# Patient Record
Sex: Male | Born: 1950 | Race: Black or African American | Hispanic: No | Marital: Married | Smoking: Current every day smoker
Health system: Southern US, Community
[De-identification: ages and names within clinical notes are randomized; demographics above are authoritative.]

## PROBLEM LIST (undated history)

## (undated) DIAGNOSIS — I219 Acute myocardial infarction, unspecified: Secondary | ICD-10-CM

## (undated) DIAGNOSIS — M1990 Unspecified osteoarthritis, unspecified site: Secondary | ICD-10-CM

## (undated) DIAGNOSIS — Z9981 Dependence on supplemental oxygen: Secondary | ICD-10-CM

## (undated) DIAGNOSIS — F32A Depression, unspecified: Secondary | ICD-10-CM

## (undated) DIAGNOSIS — F419 Anxiety disorder, unspecified: Secondary | ICD-10-CM

## (undated) DIAGNOSIS — F329 Major depressive disorder, single episode, unspecified: Secondary | ICD-10-CM

## (undated) DIAGNOSIS — K219 Gastro-esophageal reflux disease without esophagitis: Secondary | ICD-10-CM

## (undated) DIAGNOSIS — E119 Type 2 diabetes mellitus without complications: Secondary | ICD-10-CM

## (undated) DIAGNOSIS — I2510 Atherosclerotic heart disease of native coronary artery without angina pectoris: Secondary | ICD-10-CM

## (undated) DIAGNOSIS — R0602 Shortness of breath: Secondary | ICD-10-CM

## (undated) DIAGNOSIS — I10 Essential (primary) hypertension: Secondary | ICD-10-CM

## (undated) DIAGNOSIS — E785 Hyperlipidemia, unspecified: Secondary | ICD-10-CM

## (undated) DIAGNOSIS — J449 Chronic obstructive pulmonary disease, unspecified: Secondary | ICD-10-CM

## (undated) DIAGNOSIS — G4733 Obstructive sleep apnea (adult) (pediatric): Secondary | ICD-10-CM

## (undated) DIAGNOSIS — K2970 Gastritis, unspecified, without bleeding: Secondary | ICD-10-CM

## (undated) HISTORY — DX: Chronic obstructive pulmonary disease, unspecified: J44.9

## (undated) HISTORY — DX: Gastritis, unspecified, without bleeding: K29.70

## (undated) HISTORY — PX: CARDIAC CATHETERIZATION: SHX172

## (undated) HISTORY — PX: CORONARY ANGIOPLASTY: SHX604

## (undated) HISTORY — PX: DENTAL SURGERY: SHX609

## (undated) HISTORY — DX: Essential (primary) hypertension: I10

## (undated) HISTORY — DX: Obstructive sleep apnea (adult) (pediatric): G47.33

## (undated) HISTORY — DX: Atherosclerotic heart disease of native coronary artery without angina pectoris: I25.10

## (undated) SURGERY — ARTHROPLASTY, HIP, TOTAL,POSTERIOR APPROACH
Anesthesia: General | Laterality: Right

---

## 2003-08-19 ENCOUNTER — Ambulatory Visit (HOSPITAL_COMMUNITY): Admission: RE | Admit: 2003-08-19 | Discharge: 2003-08-19 | Payer: Self-pay | Admitting: Family Medicine

## 2003-11-02 ENCOUNTER — Emergency Department (HOSPITAL_COMMUNITY): Admission: EM | Admit: 2003-11-02 | Discharge: 2003-11-02 | Payer: Self-pay | Admitting: Emergency Medicine

## 2004-02-17 ENCOUNTER — Ambulatory Visit: Payer: Self-pay | Admitting: Family Medicine

## 2004-02-21 ENCOUNTER — Ambulatory Visit: Payer: Self-pay | Admitting: *Deleted

## 2004-02-21 ENCOUNTER — Ambulatory Visit: Payer: Self-pay | Admitting: Family Medicine

## 2004-07-06 ENCOUNTER — Emergency Department (HOSPITAL_COMMUNITY): Admission: EM | Admit: 2004-07-06 | Discharge: 2004-07-06 | Payer: Self-pay | Admitting: *Deleted

## 2004-07-06 ENCOUNTER — Ambulatory Visit (HOSPITAL_COMMUNITY): Admission: RE | Admit: 2004-07-06 | Discharge: 2004-07-06 | Payer: Self-pay | Admitting: Family Medicine

## 2004-07-16 ENCOUNTER — Ambulatory Visit: Payer: Self-pay | Admitting: Gastroenterology

## 2004-07-16 ENCOUNTER — Inpatient Hospital Stay (HOSPITAL_COMMUNITY): Admission: EM | Admit: 2004-07-16 | Discharge: 2004-07-19 | Payer: Self-pay | Admitting: Emergency Medicine

## 2004-09-09 ENCOUNTER — Ambulatory Visit (HOSPITAL_BASED_OUTPATIENT_CLINIC_OR_DEPARTMENT_OTHER): Admission: RE | Admit: 2004-09-09 | Discharge: 2004-09-09 | Payer: Self-pay | Admitting: Family Medicine

## 2004-09-13 ENCOUNTER — Ambulatory Visit: Payer: Self-pay | Admitting: Internal Medicine

## 2004-10-09 ENCOUNTER — Encounter (INDEPENDENT_AMBULATORY_CARE_PROVIDER_SITE_OTHER): Payer: Self-pay | Admitting: Specialist

## 2004-10-09 ENCOUNTER — Ambulatory Visit (HOSPITAL_COMMUNITY): Admission: RE | Admit: 2004-10-09 | Discharge: 2004-10-09 | Payer: Self-pay | Admitting: General Surgery

## 2006-02-22 ENCOUNTER — Emergency Department (HOSPITAL_COMMUNITY): Admission: EM | Admit: 2006-02-22 | Discharge: 2006-02-22 | Payer: Self-pay | Admitting: Emergency Medicine

## 2007-07-15 ENCOUNTER — Inpatient Hospital Stay (HOSPITAL_COMMUNITY): Admission: EM | Admit: 2007-07-15 | Discharge: 2007-07-20 | Payer: Self-pay | Admitting: Emergency Medicine

## 2007-07-16 ENCOUNTER — Encounter (INDEPENDENT_AMBULATORY_CARE_PROVIDER_SITE_OTHER): Payer: Self-pay | Admitting: *Deleted

## 2008-06-22 ENCOUNTER — Inpatient Hospital Stay (HOSPITAL_COMMUNITY): Admission: EM | Admit: 2008-06-22 | Discharge: 2008-06-29 | Payer: Self-pay | Admitting: Emergency Medicine

## 2008-07-18 ENCOUNTER — Encounter (HOSPITAL_COMMUNITY): Admission: RE | Admit: 2008-07-18 | Discharge: 2008-10-16 | Payer: Self-pay | Admitting: Cardiology

## 2008-08-14 ENCOUNTER — Ambulatory Visit (HOSPITAL_COMMUNITY): Payer: Self-pay | Admitting: Psychiatry

## 2008-08-28 ENCOUNTER — Ambulatory Visit (HOSPITAL_COMMUNITY): Payer: Self-pay | Admitting: Psychiatry

## 2008-10-03 ENCOUNTER — Ambulatory Visit (HOSPITAL_COMMUNITY): Payer: Self-pay | Admitting: Licensed Clinical Social Worker

## 2008-12-07 ENCOUNTER — Observation Stay (HOSPITAL_COMMUNITY): Admission: EM | Admit: 2008-12-07 | Discharge: 2008-12-10 | Payer: Self-pay | Admitting: Emergency Medicine

## 2008-12-09 ENCOUNTER — Encounter (INDEPENDENT_AMBULATORY_CARE_PROVIDER_SITE_OTHER): Payer: Self-pay | Admitting: Internal Medicine

## 2009-04-14 ENCOUNTER — Ambulatory Visit (HOSPITAL_COMMUNITY): Payer: Self-pay | Admitting: Psychiatry

## 2009-06-04 ENCOUNTER — Ambulatory Visit (HOSPITAL_COMMUNITY): Payer: Self-pay | Admitting: Psychiatry

## 2009-08-11 ENCOUNTER — Ambulatory Visit (HOSPITAL_COMMUNITY): Payer: Self-pay | Admitting: Psychiatry

## 2009-10-01 ENCOUNTER — Ambulatory Visit (HOSPITAL_COMMUNITY): Payer: Self-pay | Admitting: Psychiatry

## 2009-11-03 ENCOUNTER — Ambulatory Visit (HOSPITAL_COMMUNITY): Payer: Self-pay | Admitting: Psychiatry

## 2009-12-17 ENCOUNTER — Ambulatory Visit (HOSPITAL_COMMUNITY): Payer: Self-pay | Admitting: Psychiatry

## 2010-02-16 ENCOUNTER — Ambulatory Visit (HOSPITAL_COMMUNITY): Payer: Self-pay | Admitting: Psychiatry

## 2010-04-24 ENCOUNTER — Ambulatory Visit (HOSPITAL_COMMUNITY): Payer: Self-pay | Admitting: Psychiatry

## 2010-05-04 ENCOUNTER — Ambulatory Visit (HOSPITAL_COMMUNITY): Payer: Self-pay | Admitting: Psychiatry

## 2010-05-07 ENCOUNTER — Ambulatory Visit: Payer: Self-pay | Admitting: Sports Medicine

## 2010-06-29 ENCOUNTER — Encounter (HOSPITAL_COMMUNITY): Payer: MEDICARE | Admitting: Psychiatry

## 2010-06-29 DIAGNOSIS — F39 Unspecified mood [affective] disorder: Secondary | ICD-10-CM

## 2010-07-13 ENCOUNTER — Other Ambulatory Visit: Payer: Self-pay | Admitting: Orthopedic Surgery

## 2010-07-13 ENCOUNTER — Ambulatory Visit
Admission: RE | Admit: 2010-07-13 | Discharge: 2010-07-13 | Disposition: A | Discharge status: Home / Self Care | Payer: MEDICARE | Source: Ambulatory Visit | Attending: Orthopedic Surgery | Admitting: Orthopedic Surgery

## 2010-07-13 DIAGNOSIS — M25559 Pain in unspecified hip: Secondary | ICD-10-CM

## 2010-08-04 ENCOUNTER — Ambulatory Visit: Payer: MEDICARE | Admitting: Physical Therapy

## 2010-08-17 ENCOUNTER — Ambulatory Visit: Payer: MEDICARE | Admitting: Physical Therapy

## 2010-08-23 LAB — POCT I-STAT, CHEM 8
Calcium, Ion: 1.22 mmol/L (ref 1.12–1.32)
Chloride: 110 mEq/L (ref 96–112)
Glucose, Bld: 109 mg/dL — ABNORMAL HIGH (ref 70–99)
HCT: 51 % (ref 39.0–52.0)
Hemoglobin: 17.3 g/dL — ABNORMAL HIGH (ref 13.0–17.0)
Potassium: 3.6 mEq/L (ref 3.5–5.1)

## 2010-08-23 LAB — CK TOTAL AND CKMB (NOT AT ARMC): Relative Index: 2.1 (ref 0.0–2.5)

## 2010-08-23 LAB — POCT CARDIAC MARKERS: Troponin i, poc: <0.05 ng/mL (ref 0.00–0.09)

## 2010-08-23 LAB — RAPID URINE DRUG SCREEN, HOSP PERFORMED
Barbiturates: NOT DETECTED
Cocaine: NOT DETECTED
Opiates: POSITIVE — AB
Tetrahydrocannabinol: POSITIVE — AB

## 2010-08-23 LAB — CARDIAC PANEL(CRET KIN+CKTOT+MB+TROPI)
CK, MB: 1.8 ng/mL (ref 0.3–4.0)
CK, MB: 2.3 ng/mL (ref 0.3–4.0)
Relative Index: 1.5 (ref 0.0–2.5)
Relative Index: 1.9 (ref 0.0–2.5)
Total CK: 118 U/L (ref 7–232)
Troponin I: 0.01 ng/mL (ref 0.00–0.06)

## 2010-08-23 LAB — PROTIME-INR
INR: 1 (ref 0.00–1.49)
Prothrombin Time: 13.3 seconds (ref 11.6–15.2)

## 2010-08-23 LAB — APTT: aPTT: 30 seconds (ref 24–37)

## 2010-08-23 LAB — DIFFERENTIAL
Lymphs Abs: 2.4 10*3/uL (ref 0.7–4.0)
Monocytes Relative: 11 % (ref 3–12)
Neutro Abs: 4.9 10*3/uL (ref 1.7–7.7)
Neutrophils Relative %: 59 % (ref 43–77)

## 2010-08-23 LAB — TROPONIN I: Troponin I: 0.03 ng/mL (ref 0.00–0.06)

## 2010-08-23 LAB — CBC
Platelets: 251 10*3/uL (ref 150–400)
RBC: 5.46 MIL/uL (ref 4.22–5.81)
WBC: 8.3 10*3/uL (ref 4.0–10.5)

## 2010-08-24 ENCOUNTER — Encounter (HOSPITAL_COMMUNITY): Payer: MEDICARE | Admitting: Psychiatry

## 2010-08-26 ENCOUNTER — Encounter (HOSPITAL_COMMUNITY): Payer: MEDICARE | Admitting: Psychiatry

## 2010-08-26 DIAGNOSIS — F331 Major depressive disorder, recurrent, moderate: Secondary | ICD-10-CM

## 2010-08-31 ENCOUNTER — Ambulatory Visit: Payer: MEDICARE | Admitting: Physical Therapy

## 2010-09-01 LAB — BASIC METABOLIC PANEL
BUN: 13 mg/dL (ref 6–23)
BUN: 15 mg/dL (ref 6–23)
BUN: 17 mg/dL (ref 6–23)
CO2: 23 mEq/L (ref 19–32)
CO2: 25 mEq/L (ref 19–32)
CO2: 28 mEq/L (ref 19–32)
Calcium: 9.1 mg/dL (ref 8.4–10.5)
Chloride: 107 mEq/L (ref 96–112)
Creatinine, Ser: 1.18 mg/dL (ref 0.4–1.5)
GFR calc non Af Amer: >60 mL/min (ref >60)
GFR calc non Af Amer: >60 mL/min (ref >60)
GFR calc non Af Amer: >60 mL/min (ref >60)
Glucose, Bld: 105 mg/dL — ABNORMAL HIGH (ref 70–99)
Glucose, Bld: 126 mg/dL — ABNORMAL HIGH (ref 70–99)
Glucose, Bld: 153 mg/dL — ABNORMAL HIGH (ref 70–99)
Glucose, Bld: 96 mg/dL (ref 70–99)
Potassium: 4 mEq/L (ref 3.5–5.1)
Potassium: 4 mEq/L (ref 3.5–5.1)
Potassium: 4.1 mEq/L (ref 3.5–5.1)
Potassium: 4.2 mEq/L (ref 3.5–5.1)
Potassium: 4.3 mEq/L (ref 3.5–5.1)
Sodium: 136 mEq/L (ref 135–145)
Sodium: 138 mEq/L (ref 135–145)

## 2010-09-01 LAB — URINALYSIS, ROUTINE W REFLEX MICROSCOPIC
Bilirubin Urine: NEGATIVE
Glucose, UA: NEGATIVE mg/dL
Hgb urine dipstick: NEGATIVE
Ketones, ur: NEGATIVE mg/dL
Nitrite: NEGATIVE
Protein, ur: NEGATIVE mg/dL
Specific Gravity, Urine: 1.016 (ref 1.005–1.030)
Urobilinogen, UA: 0.2 mg/dL (ref 0.0–1.0)
pH: 5.5 (ref 5.0–8.0)

## 2010-09-01 LAB — LIPID PANEL
HDL: 46 mg/dL
Total CHOL/HDL Ratio: 3.8 ratio
Triglycerides: 41 mg/dL
VLDL: 8 mg/dL (ref 0–40)

## 2010-09-01 LAB — CBC
HCT: 42.4 % (ref 39.0–52.0)
HCT: 42.6 % (ref 39.0–52.0)
HCT: 43.9 % (ref 39.0–52.0)
HCT: 44.9 % (ref 39.0–52.0)
HCT: 48.8 % (ref 39.0–52.0)
Hemoglobin: 14.7 g/dL (ref 13.0–17.0)
Hemoglobin: 14.8 g/dL (ref 13.0–17.0)
Hemoglobin: 15.3 g/dL (ref 13.0–17.0)
Hemoglobin: 15.7 g/dL (ref 13.0–17.0)
Hemoglobin: 16.1 g/dL (ref 13.0–17.0)
MCHC: 34 g/dL (ref 30.0–36.0)
MCHC: 34.2 g/dL (ref 30.0–36.0)
MCHC: 34.5 g/dL (ref 30.0–36.0)
MCV: 90.1 fL (ref 78.0–100.0)
MCV: 92.1 fL (ref 78.0–100.0)
Platelets: 239 10*3/uL (ref 150–400)
Platelets: 275 10*3/uL (ref 150–400)
Platelets: 275 10*3/uL (ref 150–400)
RBC: 4.67 MIL/uL (ref 4.22–5.81)
RBC: 4.77 MIL/uL (ref 4.22–5.81)
RBC: 5.14 MIL/uL (ref 4.22–5.81)
RDW: 13.7 % (ref 11.5–15.5)
RDW: 13.8 % (ref 11.5–15.5)
RDW: 13.8 % (ref 11.5–15.5)
RDW: 13.8 % (ref 11.5–15.5)
RDW: 13.9 % (ref 11.5–15.5)
WBC: 14.5 10*3/uL — ABNORMAL HIGH (ref 4.0–10.5)
WBC: 8.6 10*3/uL (ref 4.0–10.5)

## 2010-09-01 LAB — CK TOTAL AND CKMB (NOT AT ARMC)
CK, MB: 27.4 ng/mL — ABNORMAL HIGH (ref 0.3–4.0)
Relative Index: 0.6 (ref 0.0–2.5)
Relative Index: 12.9 — ABNORMAL HIGH (ref 0.0–2.5)

## 2010-09-01 LAB — CARDIAC PANEL(CRET KIN+CKTOT+MB+TROPI)
CK, MB: 1.3 ng/mL (ref 0.3–4.0)
CK, MB: 1.4 ng/mL (ref 0.3–4.0)
CK, MB: 3.3 ng/mL (ref 0.3–4.0)
Relative Index: 0.8 (ref 0.0–2.5)
Relative Index: 1 (ref 0.0–2.5)
Total CK: 163 U/L (ref 7–232)
Total CK: 260 U/L — ABNORMAL HIGH (ref 7–232)
Troponin I: 0.01 ng/mL (ref 0.00–0.06)

## 2010-09-01 LAB — URINE MICROSCOPIC-ADD ON

## 2010-09-01 LAB — DRUGS OF ABUSE SCREEN W/O ALC, ROUTINE URINE
Creatinine,U: 71.9 mg/dL
Marijuana Metabolite: POSITIVE — AB
Methadone: NEGATIVE
Opiate Screen, Urine: NEGATIVE
Propoxyphene: NEGATIVE

## 2010-09-01 LAB — RAPID URINE DRUG SCREEN, HOSP PERFORMED
Amphetamines: NOT DETECTED
Barbiturates: NOT DETECTED
Benzodiazepines: NOT DETECTED
Cocaine: POSITIVE — AB
Opiates: POSITIVE — AB
Tetrahydrocannabinol: POSITIVE — AB

## 2010-09-01 LAB — POCT I-STAT 3, ART BLOOD GAS (G3+)
Acid-base deficit: 3 mmol/L — ABNORMAL HIGH (ref 0.0–2.0)
Bicarbonate: 21.4 meq/L (ref 20.0–24.0)
O2 Saturation: 93 %
TCO2: 22 mmol/L (ref 0–100)
pCO2 arterial: 34.7 mmHg — ABNORMAL LOW (ref 35.0–45.0)
pH, Arterial: 7.398 (ref 7.350–7.450)
pO2, Arterial: 68 mmHg — ABNORMAL LOW (ref 80.0–100.0)

## 2010-09-01 LAB — COMPREHENSIVE METABOLIC PANEL
ALT: 27 U/L (ref 0–53)
Calcium: 9.9 mg/dL (ref 8.4–10.5)
GFR calc Af Amer: >60 mL/min (ref >60)
Glucose, Bld: 94 mg/dL (ref 70–99)
Sodium: 141 mEq/L (ref 135–145)
Total Protein: 6.9 g/dL (ref 6.0–8.3)

## 2010-09-01 LAB — POCT CARDIAC MARKERS
CKMB, poc: <1 ng/mL — ABNORMAL LOW (ref 1.0–8.0)
CKMB, poc: <1 ng/mL — ABNORMAL LOW (ref 1.0–8.0)
Myoglobin, poc: 65.2 ng/mL (ref 12–200)
Myoglobin, poc: 69.1 ng/mL (ref 12–200)
Troponin i, poc: <0.05 ng/mL (ref 0.00–0.09)
Troponin i, poc: <0.05 ng/mL (ref 0.00–0.09)

## 2010-09-01 LAB — DIFFERENTIAL
Basophils Absolute: 0 K/uL (ref 0.0–0.1)
Basophils Relative: 1 % (ref 0–1)
Eosinophils Absolute: 0.2 K/uL (ref 0.0–0.7)
Eosinophils Relative: 2 % (ref 0–5)
Lymphocytes Relative: 29 % (ref 12–46)
Lymphs Abs: 2.2 K/uL (ref 0.7–4.0)
Monocytes Absolute: 0.7 K/uL (ref 0.1–1.0)
Monocytes Relative: 9 % (ref 3–12)
Neutro Abs: 4.4 K/uL (ref 1.7–7.7)
Neutrophils Relative %: 59 % (ref 43–77)

## 2010-09-01 LAB — APTT: aPTT: 32 seconds (ref 24–37)

## 2010-09-01 LAB — BRAIN NATRIURETIC PEPTIDE: Pro B Natriuretic peptide (BNP): <30 pg/mL (ref 0.0–100.0)

## 2010-09-01 LAB — HIV ANTIBODY (ROUTINE TESTING W REFLEX): HIV: NONREACTIVE

## 2010-09-01 LAB — HEPARIN ANTI-XA: Heparin LMW: 1.3 IU/mL

## 2010-09-01 LAB — PROTIME-INR: Prothrombin Time: 14.6 seconds (ref 11.6–15.2)

## 2010-09-01 LAB — TROPONIN I

## 2010-09-01 LAB — T4, FREE: Free T4: 0.85 ng/dL — ABNORMAL LOW (ref 0.89–1.80)

## 2010-09-01 LAB — HIV-1 RNA QUANT-NO REFLEX-BLD
HIV 1 RNA Quant: <48 {copies}/mL
HIV-1 RNA Quant, Log: <1.68 {Log}

## 2010-09-01 LAB — GLUCOSE, CAPILLARY: Glucose-Capillary: 231 mg/dL — ABNORMAL HIGH (ref 70–99)

## 2010-09-15 ENCOUNTER — Ambulatory Visit: Payer: MEDICARE | Admitting: Physical Therapy

## 2010-09-29 NOTE — H&P (Signed)
[name], [name] NO.:  [id number]   MEDICAL RECORD NO.:  [id number]          PATIENT TYPE:  INP   LOCATION:  3737                         FACILITY:  [facility]   PHYSICIAN:  Della Goo, M.D. DATE OF BIRTH:  05-14-1951   DATE OF ADMISSION:  12/07/2008  DATE OF DISCHARGE:                              HISTORY & PHYSICAL   ADDENDUM:   PAST SURGICAL HISTORY:  The patient has a history of the prior cardiac  catheterizations with PTCA and stenting of the LAD a total of five  times.  Initially x2, then a repeat x2 and then a repeat x1.   PAST MEDICAL HISTORY:  The patient also has hyperlipidemia and sciatica.   MEDICATIONS AT THIS TIME:  1. Plavix 75 mg 1 p.o. daily.  2. Amlodipine 5 mg 1 p.o. daily.  3. Metoprolol 25 mg 1 p.o. b.i.d.  4. Pravastatin 80 mg 1 p.o. daily.  5. Mirtazapine 15 mg p.o. nightly.   ALLERGIES:  NO KNOWN DRUG ALLERGIES.   SOCIAL HISTORY:  The patient reports smoking and smokes seven cigarettes  daily.  He previously had smoked more than this and has cut down.  He  reports rare alcohol usage.  He does have a past history of cocaine  abuse.   FAMILY HISTORY:  Positive for coronary artery disease in his father,  hypertension in both parents and diabetes in his mother.  No history of  cancer in his family that he knows of.   REVIEW OF SYSTEMS:  Pertinents are mentioned in the initial portion of  the H and P that included the HPI.   PHYSICAL EXAMINATION:  GENERAL:  This is a 60 year old well-nourished,  well-developed male in no visible discomfort or acute distress  currently.  VITAL SIGNS:  Temperature 97.7, blood pressure 123/81, heart rate 96,  respirations 17.  O2 saturations 100% on 2 liters nasal cannula oxygen.  HEENT:  Normocephalic, atraumatic.  Pupils equally round and reactive to  light.  Extraocular movements are intact.  There is no scleral icterus.  Funduscopic benign.  Nares are patent bilaterally.  Oropharynx is  clear.  NECK:  Supple.  Full range of motion.  No thyromegaly, adenopathy or  jugulovenous distention.  CARDIOVASCULAR:  Regular rate and rhythm.  No  murmurs, gallops or rubs.  No chest wall tenderness.  Lung fields reveal  diffuse rhonchorous breath sounds and no rales or wheezes.  ABDOMEN:  Positive bowel sounds.  Soft, nontender, nondistended.  EXTREMITIES:  Without cyanosis, clubbing or edema.  NEUROLOGIC:  The patient is alert and oriented x3 and there are no focal  deficits on examination.   LABORATORY STUDIES:  White blood cell count 8.3, hemoglobin 16.6,  hematocrit 49.7, platelets 251, neutrophils 59%, lymphocytes 29%, MCV  91.1, protime 13.3, INR 1.0, PTT 30.  Sodium 140, potassium 3.6,  chloride 110, CO2 of 21, BUN 20, creatinine 1.1 and glucose 109.  Chest  x-ray reveals no acute cardiopulmonary abnormalities, stable chronic  lung disease changes are seen.  Point of care cardiac markers with a  myoglobin of 83.1, CK-MB less than 1.0 and troponin  less than 0.05.  EKG  reveals a normal sinus rhythm.  There are no acute ST-segment changes  seen.   ASSESSMENT:  A 60 year old male being admitted with;  1. Chest pain.  2. Shortness of breath.  3. Chronic obstructive pulmonary disease exacerbation.  4. Upper respiratory infection/acute bronchitis.  5. Tobacco abuse.  6. Past history of polysubstance abuse.   PLAN:  The patient will be admitted to a telemetry area for cardiac  monitoring.  Cardiac enzymes will be performed.  The patient will be  placed on nebulizer treatments and supplemental oxygen as needed.  His  regular medications will be continued and the patient will also be  placed on nitropaste therapy topically and aspirin therapy.  DVT and GI  prophylaxis have also been ordered.  A urine drug screen has been  ordered as well.      Della Goo, M.D.  Electronically Signed     HJ/MEDQ  D:  12/07/2008  T:  12/07/2008  Job:  [id number]   cc:   Renaye Rakers,  M.D.

## 2010-09-29 NOTE — Discharge Summary (Signed)
NAMEKEYION, KNACK NO.:  [id number]   MEDICAL RECORD NO.:  [id number]          PATIENT TYPE:  INP   LOCATION:  2029                         FACILITY:  [facility]   PHYSICIAN:  Marcellus Scott, MD     DATE OF BIRTH:  07-Dec-1950   DATE OF ADMISSION:  06/22/2008  DATE OF DISCHARGE:  06/29/2008                               DISCHARGE SUMMARY   PRIMARY MEDICAL DOCTOR:  Dr. Renaye Rakers of the [facility].   PRIMARY CARDIOLOGIST:  Dr. Donnie Aho.   DISCHARGE DIAGNOSES:  1. Chest pain - resolved.  Status post restenting of left anterior      descending artery in patient with prior history of coronary artery      disease and left anterior descending stent.  2. Hypertension.  3. Dyslipidemia.  4. Tobacco abuse.  5. Substance abuse - cocaine and tetrahydrocannabinol.  6. Chronic obstructive pulmonary disease exacerbation.  Exacerbation      resolved.  On home oxygen.  7. Leukocytosis secondary to steroids.  8. Obstructive sleep apnea on nightly continuous positive airway      pressure.  9. Hyperglycemia, ? type 2 diabetes.  10.Psychiatry - anxiety disorder not otherwise specified, rule out      generalized anxiety disorder.   DISCHARGE MEDICATIONS:  1. Combivent 2 puffs inhaled q.i.d. p.r.n.  2. Enteric-coated aspirin 325 mg p.o. daily.  3. Plavix 75 mg p.o. daily.  4. Metoprolol 25 mg p.o. b.i.d.  5. Prednisone taper as per directions.  6. Pravastatin 40 mg tablet, 2 tablets p.o. q.h.s.  7. Home oxygen at 2 L/min.  8. Nightly CPAP.  9. Nicotine patch 14 mg per 24 hours, 1 daily for 1 week, then taper      to 7 mg per 24 hours daily for 2 weeks.  10.Norvasc 5 mg p.o. daily.   PROCEDURES:  1. Cardiac catheterization by Dr. Verdis Prime on February 11.  Please      refer to his procedure note for details.  Conclusion:  Successful      drug-eluting stent implanted to a region of in-stent restenosis      from a previously-placed left anterior descending bare metal  stent      with reduction in stenosis from 80 to less than 10% with TIMI grade      3 flow.  Complicated procedure due to precipitous and unexplainable      drop in blood pressure related to intracoronary nitroglycerin.  The      patient did admit to using heavy doses of Viagra prior to admission      to the hospital earlier the week before.  2. Chest x-ray on February 6:  No acute interval change.  No acute      cardiopulmonary process.   PERTINENT LABORATORY DATA:  CBC today with hemoglobin 14.8, hematocrit  42.4, white blood cell 14.4, platelets 251.  Basic metabolic panel  yesterday with BUN 17, creatinine 1.19.  HIV RNA quantitative less than  48 and quantitative log less than 1.68.  Urine drug screen positive for  marijuana.  Free T4 0.85, TSH 1.137.  Hepatic panel unremarkable.  Lipid  panel with LDL 120, HDL 46, VLDL 8, triglycerides 41 and cholesterol  174.  HIV antibody nonreactive.  Hemoglobin A1c 5.7.  Urine drug screen  on February 6 with positive cocaine, opiates and tetrahydrocannabinol.  D-dimer negative at 0.22.  BNP less than 30.   CONSULTATIONS:  1. Cardiology, Dr. Donnie Aho and Dr. Verdis Prime.  2..  Psychiatry, Dr. Jeanie Sewer.   HOSPITAL COURSE AND PATIENT DISPOSITION:  1. Mr. Lunn is a 60 year old African American male patient with      history of coronary artery disease status post 2 stents in [place] and 2 at [facility], hypertension,      hyperlipidemia, COPD on home O2, sleep apnea on CPAP nightly      through [employer], who presented with history of atypical      intermittent chest pain ongoing for 2 months prior to admission.      He he had been noncompliant to medications secondary to financial      problems.  He was then admitted to the hospital for further      evaluation and management.  He was admitted to telemetry.      Cardiology was consulted.  Dr. Donnie Aho did see him.  Initially he      planned for a stress test but  subsequently the patient underwent a      cardiac catheterization which basically showed in-stent restenosis      of the LAD stent.  Interventional cardiologist Dr. Katrinka Blazing was called      in consult and he was able to place a drug-eluting stent.      Periprocedure the patient dropped his blood pressure, following      which he had to have pressors, that is, dopamine briefly.  The      patient was transferred to step-down unit for a day.  The patient      since then has done quite well with no further chest pains.  He has      also been seen in cardiac rehab.  The patient, of note, in the      hospital has been on aspirin, Plavix, metoprolol.  Post restenting      the patient had a bump in troponin which may be secondary to the      intervention itself and secondary to the transient drop in blood      pressure.  Cardiology has seen him and cleared him for discharge      today.  The patient has been counseled regarding abstinence from      all the substance abuse including tobacco, cocaine and marijuana.      He repeatedly has expressed that he will not go back to these      substances because he is aware that these are going to be      detrimental to his health and even could kill him.  He has also      been made aware of the dangerous side effects of using cocaine      while on metoprolol.  He verbalizes understanding.  The patient we      will need to follow up with Dr. Donnie Aho in a week from discharge.      The patient indicates that he gets his disability payment on the      3rd of every month.  Thereby, assistance is made in terms of  providing him with a Plavix card which will provide him with 2      weeks' supply of Plavix.  He indicates that he will be able to buy      the remaining duration of the Plavix until he gets his disability.      We will also ask social worker to see if they can assist in any      way.  Also, significant other medications such as metoprolol and       pravastatin are being made available so that he can purchase them      at the $4 prescriptions.  2. Hypertension.  Continue with metoprolol and amlodipine.  3. Dyslipidemia.  Simvastatin has been switched to Pravachol so as to      be able to afford and also with lesser drug interactions.  4. Tobacco abuse.  Cessation counseling done.  The patient requests a      nicotine patch, which will be provided.  5. Substance abuse - Cocaine and tetrahydrocannabinol.  Again,      cessation counseling done repeatedly.  6. Chronic obstructive pulmonary disease exacerbation.  Exacerbation      has resolved.  The patient will be tapered off his steroids, which      is the cause of his leukocytosis.  He will also continue his home      O2.  7. Obstructive sleep apnea.  Continue his nightly CPAP per the      previous home regimen.  8. Hyperglycemia.  Rule out type 2 diabetes mellitus as an outpatient.      However, it does not seem to be the case.  This is probably steroid-      related given his A1c less than 6.  9. Psychiatry.  Dr. Jeanie Sewer did see Mr. Juanetta Gosling on February 10.      Please refer to his detailed note.  He indicated that he was not at      risk to harm himself or others.  He recommended avoiding      psychotropic medication unless he did not respond to psychotherapy      or develops severe anxiety symptoms such as panic attacks.  He has      an appointment to follow up with Dr. Lolly Mustache at the [facility]      outpatient behavioral health center on August 14, 2008, at 9:30 a.m.   The patient has some abnormality of thyroid function tests with normal  TSH and low free T4.  Consider repeating these thyroid function tests in  4 weeks.  He is clinically euthyroid.   Time spent in coordinating this discharge is 45 minutes.      Marcellus Scott, MD  Electronically Signed     AH/MEDQ  D:  06/29/2008  T:  06/29/2008  Job:  [id number]   cc:   Renaye Rakers, M.D.  Georga Hacking, M.D.   Lyn Records, M.D.  Antonietta Breach, M.D.  Syed T. Lolly Mustache, M.D.

## 2010-09-29 NOTE — Cardiovascular Report (Signed)
[name], [name] NO.:  [id number]   MEDICAL RECORD NO.:  [id number]          PATIENT TYPE:  INP   LOCATION:  3739                         FACILITY:  [facility]   PHYSICIAN:  Georga Hacking, M.D.DATE OF BIRTH:  [birth year]   DATE OF PROCEDURE:  06/22/2008  DATE OF DISCHARGE:                            CARDIAC CATHETERIZATION   HISTORY:  A 60 year old black male with a previous history of a  myocardial infarction in 2000 treated with T and K.  He later had  catheterization showing an occluded marginal branch and had a bare-metal  stent in the first marginal branch as well as in the mid LAD.  In 2003  in [place], [place], he had recurrent chest pain and was found  to have in-stent restenosis of the first marginal branch and a new  stenosis in the mid circumflex.  He had a drug-eluting stent placed  across the area of in-stent restenosis and a bare-metal stent in the  circumflex.  Intravascular ultrasound of the LAD at that time showed  luminal diameter of 4 sq mm.  This artery was left alone.  He presented  recently to the emergency room with cocaine use and chest pain, but no  EKG changes or enzymes and has continued to have chest pain.   PROCEDURE:  Left heart catheterization with coronary angiograms and left  ventriculogram.   COMMENTS ABOUT PROCEDURE:  The procedure was done in the cardiovascular  lab.  He was prepped and draped in the usual manner.  After Xylocaine  anesthesia, a 6-French sheath was placed in the right femoral  percutaneously.  Angiograms were made using 6-French catheters and a 30  mL ventriculogram was performed and tolerated the procedure well.   HEMODYNAMIC DATA:  Aorta postcontrast 112/5-11, LV postcontrast 112/79.   ANGIOGRAPHIC DATA:  Left ventriculogram:  Performed in the 30-degree RAO  projection.  The aortic valve is normal.  The mitral valve is normal.  The left ventricle is normal in size.  There is akinesis of the  distal  inferior and apical segments.  Estimated ejection fraction is 50%.  Coronary arteries:  Arise and distribute normally.  Minimal coronary  calcification was noted.  Left main coronary artery is normal.  Left  anterior descending has a small-to-moderate size first diagonal branch  with a severe 95% ostial stenosis present.  The left anterior descending  has a very radiopaque stent in the vessel just after the first septal  perforator with an area of diffuse 60-70% in-stent restenosis  angiographically.  Circumflex coronary artery has a first marginal  branch that has a very radiopaque stent with the appearance of possibly  to stents in the proximal portion.  This appears widely patent with very  minimal in-stent restenosis of the very ostium of the stent.  A second  stent and radiopaque is also seen in the circumflex continuation branch  and is widely patent with no significant re-narrowing.  The right  coronary artery is a codominant vessel and has a diffuse 50% proximal  stenosis noted, has a small posterior descending artery.   IMPRESSION:  1.  Significant coronary artery disease with a severe ostial stenosis      involving the diagonal branch, diffuse in-stent restenosis to left      anterior descending coronary artery, uncertain of significance,      widely patent stents in the first marginal and continuation branch      and moderate stenosis involving the right coronary artery.  2. Abnormal left ventricular function with apical hypokinesis and      distal inferior hypokinesis.   RECOMMENDATIONS:  I am not sure the LAD stent is that much different,  but we will consult Interventional Cardiology for opinion about whether  to continue medical treatment or consider intravascular ultrasound or  other percutaneous therapy.      Georga Hacking, M.D.  Electronically Signed     WST/MEDQ  D:  06/26/2008  T:  06/27/2008  Job:  [id number]   cc:   Renaye Rakers, M.D.

## 2010-09-29 NOTE — Discharge Summary (Signed)
[name], [name] NO.:  [id number]   MEDICAL RECORD NO.:  [id number]          PATIENT TYPE:  OBV   LOCATION:  3737                         FACILITY:  [facility]   PHYSICIAN:  Michelene Gardener, MD    DATE OF BIRTH:  1951/04/19   DATE OF ADMISSION:  12/07/2008  DATE OF DISCHARGE:  12/10/2008                               DISCHARGE SUMMARY   DISCHARGE DIAGNOSES:  1. Pleuritic chest pain, most likely secondary to acute bronchitis.  2. Acute bronchitis.  3. Hypoxia that resolved.  4. History of chronic obstructive pulmonary disease that has been      stable during this hospitalization.  5. Tobacco abuse.  6. Obstructive sleep apnea, and the patient is noncompliant with his      CPAP.  7. Hypertension.  8. Hyperlipidemia.  9. Benign prostatic hypertrophy.  10.Remote history of cocaine abuse.  11.Active marijuana use.   DISCHARGE MEDICATIONS:  1. Plavix 75 mg once a day.  2. Norvasc 5 mg once a day.  3. Metoprolol 25 mg twice daily.  4. Pravastatin 80 mg once a day.  5. Mirtazapine 15 mg once a day.   CONSULTATIONS:  Cardiology consult, initially done by Dr. Amil Amen, and  the patient then followed by his cardiologist, Dr. Donnie Aho.   PROCEDURES:  None.   DIAGNOSTIC STUDIES:  1. Chest x-ray on July 24 showed no acute cardiopulmonary      abnormalities.  2. CT angiography of the chest with contrast on July 26 showed no      evidence of PE and no acute abnormalities.  Echocardiogram on July      26 showed ejection fraction in the range of 40-45% with severe      hypokinesis of the inferolateral myocardium and grade 1 diastolic      dysfunction.   FOLLOWUP:  1. Renaye Rakers, MD, in 1-2 weeks.  2. W. Viann Fish, MD, as needed.   COURSE OF HOSPITALIZATION:  This is a 60 year old African American male  with known coronary artery disease with recent stent, who presented to  the hospital with atypical chest pain.  The patient was admitted to the  hospital.  He  was monitored on telemetry and his tele monitor did not  show any evidence of arrhythmia.  Three sets of troponin and cardiac  enzymes were done and they came to be normal.  The patient was evaluated  by Cardiology, who recommended an echocardiogram.  Echocardiogram was  done and results were mentioned above.  Case was discussed with Dr.  Donnie Aho, who think this is his baseline, and his symptoms are only  related to possibly bronchitis and noncardiac.  The patient will be  continued on the same medications.  He will be followed by Dr. Donnie Aho as  an outpatient.  Plan is to consider cardiac catheterization if the pain  persisted.  The patient was treated for acute bronchitis with IV  antibiotics, oxygen, and nebulizer treatment.  At the time of discharge,  he is feeling much better.  I gave him Levaquin for more 5 days.  He  will be discharged  in stable condition.  He will follow with his primary  doctor.  No change in his preadmission medications done.   TOTAL ASSESSMENT TIME:  40 minutes.      Michelene Gardener, MD  Electronically Signed     NAE/MEDQ  D:  12/10/2008  T:  12/10/2008  Job:  [id number]

## 2010-09-29 NOTE — H&P (Signed)
NAMEBLAYN, [name] NO.:  [id number]   MEDICAL RECORD NO.:  [id number]          PATIENT TYPE:  INP   LOCATION:  3737                         FACILITY:  [facility]   PHYSICIAN:  Della Goo, M.D. DATE OF BIRTH:  1951/01/25   DATE OF ADMISSION:  12/07/2008  DATE OF DISCHARGE:                              HISTORY & PHYSICAL   DATE OF ADMISSION:  December 07, 2008.   PRIMARY CARE PHYSICIAN:  Renaye Rakers, M.D.   CHIEF COMPLAINT:  Chest pain and shortness of breath   HISTORY OF PRESENT ILLNESS:  This is a 60 year old male with multiple  medical problems including coronary artery disease status post PTCA with  stent placement, who presents to the emergency department with  complaints of chest pain which he reports having off and on for 10 days  associated with shortness of breath, tightness and coughing.  He denies  having any fevers or chills, but he does report coughing up a yellowish-  type of sputum.  The patient reports having a similar feeling when he  had his previous MIs.  He denies having any nausea, vomiting or  diaphoresis.  He denies having any syncope or lightheadedness.   PAST MEDICAL HISTORY:  1. Significant for coronary artery disease status post initial PTCA      with stent placement in [place], than a repeat stent      placement secondary to restenosis x2 performed at [facility] and then restenting of one vessel by      Dr. Verdis Prime, Cardiologist in February 2010.  2. He also has a history of COPD.  3. Obstructive sleep apnea.  4. Hypertension.  5. Hyperlipidemia.  6. Hemorrhoids.  7. Benign prostatic hypertrophy.  8. Past history of cocaine abuse.  9. 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   DICTATION ENDS HERE      Della Goo, M.D.  Electronically Signed     HJ/MEDQ  D:  12/07/2008  T:  12/07/2008  Job:  [id number]   cc:   Renaye Rakers, M.D.

## 2010-09-29 NOTE — Consult Note (Signed)
NAMEQUANG, [name] NO.:  [id number]   MEDICAL RECORD NO.:  [id number]          PATIENT TYPE:  INP   LOCATION:  3739                         FACILITY:  [facility]   PHYSICIAN:  Georga Hacking, M.D.DATE OF BIRTH:  08-26-1950   DATE OF CONSULTATION:  06/23/2008  DATE OF DISCHARGE:                                 CONSULTATION   I was asked to see this 60 year old African American male for evaluation  of chest pain in the setting of coronary disease.  The patient is 60  years old and has previously been a difficult historian.  He reports  when he was in [place], [place] that he had a myocardial  infarction and 2 stents placed, but he could not remember further  details beyond that or were the stents were placed.  There are records  in the chart indicating he may have had stents placed at [facility], but again there is no further documentation of this.  He has a  history of hyperlipidemia, history of obstructive sleep apnea and COPD,  and hypertension.  He presented to the emergency room yesterday with  fleeting atypical chest pain, also a squeezing sensation of chest pain.  He says that he sees Dr. Parke Simmers and last saw her this summer.  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  He evidently states  in the chart also that he missed an appointment with [facility] and  has been out of his medicine and cannot afford it, although he told me  that he took Norvasc and aspirin up until recently.  When he presented  to the emergency room, he would have a cramping sensation in his chest  that would go to his back and also into his neck and it is accompanied  by nausea.  He states that he has been able to exercise without chest  pain on his treadmill at home.  He had a stressful event happen to him  last week, but he was unwilling to go into, and in addition reported  using cocaine last Wednesday.  States this is the first time in a long  time.  He came to the emergency room with shortness of breath and was  tested positive for cocaine and marijuana.  He has also used cigarettes  and alcohol recently.   PAST MEDICAL HISTORY:  Remarkable for coronary artery disease.  There is  a stent present on his chest x-ray.  Hypertension, hyperlipidemia, COPD,  anxiety, history of hemorrhoids, history of condyloma acuminata with a  previous history of obstructive sleep apnea.  He has a previous history  of BPH and significant hemorrhagic cystitis.   PAST SURGICAL HISTORY:  He has had surgery on his hands done before.  He  has had surgery for condyloma acuminata.   MEDICATIONS PRIOR TO DISCHARGE:  According to him included aspirin and  Norvasc, although this is different from records in the chart.   SOCIAL HISTORY:  He has a live-in girlfriend at  his home.  He states he  is on disability for mental health reasons.  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  He does have ongoing cigarette  abuse, marijuana use, cocaine use, and alcohol use.   FAMILY HISTORY:  He has a sister who has coronary artery disease.   ALLERGIES:  None.   REVIEW OF SYSTEMS:  He thinks he might be coming down with an ulcer.  He  says that his legs are weak.  He has difficulty walking.  He has  symptoms of benign prostatic hypertrophy.  He also has erectile  dysfunction and says he used Viagra recently.  He admits to significant  anxiety.   EXAMINATION:  CONSTITUTIONAL:  He is a middle-aged black male with  somewhat pressured speech.  He is vague to answer questions and often  times will not give direct answers to questions.  VITAL SIGNS:  Blood pressure is currently 129/87, pulse currently 98.  SKIN:  Warm and dry.  ENT:  EOMI.  PERRLA.  CNS clear.  Fundi unremarkable.  Pharynx negative.  NECK:  Supple without masses, JVD, thyromegaly or bruits.  LUNGS:   Clear to A and P.  No wheezing.  CARDIAC:  Normal S1, S2.  No S3.  ABDOMEN:  Soft and nontender.  Pulses are present with 2+.   DIAGNOSTIC STUDIES:  EKG shows some lateral Q-waves consistent with a  previous lateral wall infarction.  Laboratory shows elevated glucoses.  BUN and creatinine were normal.  CBC showed hematocrit of 48.8,  hemoglobin A1c is 5.7.  Cholesterol was 174, LDL is 120, HDL is 46.  Drug screen was positive for cocaine, opiates, and THC.   IMPRESSION:  1. Chest pain with atypical features.  2. Coronary artery disease with a previous lateral wall infarction on      EKG but reported negative echo in March 2009.  3. Recent substance abuse with cocaine, marijuana and opiates.  4. Cigarette abuse.  5. Hypertension.  6. Medical noncompliance.  7. Mild hyperlipidemia.  8. History of suicide attempt in 1993, questionable underlying mental      disorder.   RECOMMENDATIONS:  I find a history to be quite difficult at this time.  He has evidence of recent cocaine usage.  My recommendations would be to  try to obtain records of where he has had his previous stents placed in  the percutaneous records.  I would suggest trying to obtain them from  [facility] and [facility] as well as  microfilm of the [facility] records.  I would continue amlodipine and  aspirin at the present time.  Once the cocaine is washed out of his  system and he has settled down, we might consider outpatient stress  testing.  I would observe him for a couple more days in the hospital, be  sure he does not have recurrent pain or unstable angina since his  enzymes are negative.  I would strongly consider a psychiatric  consultation.      Georga Hacking, M.D.  Electronically Signed     WST/MEDQ  D:  06/23/2008  T:  06/23/2008  Job:  [id number]   cc:   Renee Ramus, MD  Renaye Rakers, M.D.

## 2010-09-29 NOTE — Cardiovascular Report (Signed)
NAMEELROY, SCHEMBRI NO.:  [id number]   MEDICAL RECORD NO.:  [id number]          PATIENT TYPE:  INP   LOCATION:  2807                         FACILITY:  [facility]   PHYSICIAN:  Lyn Records, M.D.   DATE OF BIRTH:  May 16, 1951   DATE OF PROCEDURE:  06/27/2008  DATE OF DISCHARGE:                            CARDIAC CATHETERIZATION   REASON FOR PROCEDURE:  Unstable angina.   PROCEDURES PERFORMED:  1. Drug-eluting stent implantation for in-stent restenosis in the left      anterior descending.  2. Intravascular ultrasound.   INDICATION:  The patient has had somewhat atypical but recurring chest  pressure, usually at rest.  He has a history of cocaine use.  He has had  multiple prior stents placed and has a history of restenosis.  This  study is being performed to treat significant LAD ISR that is felt to be  the patient's culprit lesion.  There is also a possibility that a large  first diagonal will be treated today as well.   DESCRIPTION:  The patient was brought to the cath lab in the post  absorptive state.  We discussed the patient's procedure the night prior  and on the day of the test.  He understands the procedure, and the  possibility of complications including emergency coronary bypass surgery  and myocardial infarction.   The patient had been premedicated with Plavix on the evening prior and  the morning of the procedure, a total over the last 12 hours of 600 mg.  He was also given 1 mg of Versed and 25 mcg of fentanyl as a conscious  sedation.  A 6-French sheath was started in the right femoral artery  without complications.   A 6-French XB 3.5 guide catheter was then used to obtain guiding shots.  The patient was given a bolus followed by an infusion of Angiomax (0.75  mg/kg bolus over 2 minutes followed by 1.75 mg/kg/hour infusion).  ACT  was documented to be greater than 300.  An Office manager was used  across the stenosis in the LAD.   Intravascular ultrasound was performed  and documented a cross-sectional area of 2.91 mm square in the proximal  region of in-stent restenosis and 3.8 mm square at the distal stent  margin.  The reference vessel diameter beyond the distal vessel was 3.9  mm in diameter.   An angioscope 3.0 mm, 10-mm long balloon was used for predilatation.  Four inflations were performed up to 14 atmospheres.  We then deployed a  23 x 3.5 mm diameter XIENCE V stent to 13 atmospheres.  This stent  overlapped the previously placed stent, both proximally and distally.  After deployment of the XIENCE V, intracoronary nitroglycerin, 100 mcg,  was administered.  Within the next 30 minutes, there was severe  reduction in blood pressure into the 50 range.  This did not respond  initially to fluid bolus.  We subsequently had to use vasoactive  medication therapy including 1 ampule of intravenous atropine, a 0.2 mL  bolus of epinephrine, and a dopamine drip was started.  [name]  blood pressure gradually started to improve over the next 3-7 minutes.  The dopamine drip was weaned.  The patient experienced chest discomfort  after the stent was deployed.  It was felt that this was related to  septal perforator spasm.  Because of the spasm, intracoronary  nitroglycerin was felt to be necessary.   After recovery of the blood pressure, intracoronary verapamil was given,  100 mcg.  Flow in the septal perforators improved.  Chest discomfort  continued moderately.  Post dilatation with a 4.0 x 20 mm long Voyager  Knierim balloon was performed distally and proximally.  Each inflation was  performed to 14 atmospheres.  The patient did note during the procedure  that each balloon occlusion reproduced chest discomfort similar to those  episodes that he has been having at home.   With dopamine and IV fluid being administered, an additional 100 mcg of  intracoronary nitroglycerin was administered because of what was felt to   be spasm in this vessel, and again, a mild to moderate drop in systolic  blood pressure to 90 occurred.  This was 20 minutes after multiple fluid  boluses and dopamine administration was continuing.   Angiography was then performed demonstrating a more than adequate  angiographic result with less than 10% stenosis throughout the entire  stented region. No contrast hang up was noted.  Flow into 2 septal  perforators was decreased but improved.  The patient left the cath lab  having mild low-grade chest discomfort.  No ST-segment elevation was  noted.   Angio-Seal was used successfully to achieve hemostasis.   Angiomax will be continued at a lower infusion rate for an additional  hour or so, in case any of the discomfort in his chest is related to  thrombus.   CONCLUSIONS:  1. Successful drug-eluting stent implantation to a region of in-stent      restenosis from a previously placed left anterior descending bare-      metal stent with reduction in stenosis from 80 to less than 10%      with TIMI grade 3 flow.  2. Complicated procedure due to precipitous and unexplainable drop in      blood pressure related to intracoronary nitroglycerin.  The patient      did admit to using heavy doses of Viagra (PDE5 inhibitor) prior to      admission to the hospital earlier this week.   PLAN:  1. Serial cardiac markers.  2. EKG will be obtained in the holding area.  3. Wean and discontinue dopamine.  4. IV fluid.  5. Aspirin and Plavix for at least a year.      Lyn Records, M.D.  Electronically Signed     HWS/MEDQ  D:  06/27/2008  T:  06/28/2008  Job:  [id number]   cc:   Georga Hacking, M.D.

## 2010-09-29 NOTE — Consult Note (Signed)
[name], [name] NO.:  [id number]   MEDICAL RECORD NO.:  [id number]          PATIENT TYPE:  INP   LOCATION:  3737                         FACILITY:  [facility]   PHYSICIAN:  Francisca December, M.D.  DATE OF BIRTH:  Feb 15, 1951   DATE OF CONSULTATION:  DATE OF DISCHARGE:                                 CONSULTATION   REQUESTING PHYSICIAN:  [facility].   REASON FOR CONSULTATION:  Chest pain.   IMPRESSION:  1. Pleuritic chest pain, not likely cardiac.  2. History of severe coronary artery disease.      a.     In 2000, myocardial infarction, bare-metal stent, left       circumflex, obtuse marginal, and left anterior descending.      b.     In 2003, chest pain, bare-metal stent and drug-eluting       stent, left circumflex and obtuse marginal.      c.     In 2010, chest pain, drug-eluting stent, left anterior       descending Lyn Records, MD).  3. Chronic obstructive pulmonary disease.  4. Tobacco abuse.  5. Obstructive sleep apnea.  6. Hypertension and hyperlipidemia.  7. Benign prostatic hypertrophy.  8. Remote history of cocaine abuse.  9. Treated anal condyloma status post cauterization   RECOMMENDATIONS:  1. I agree with your plan thus far for treatment of bronchitis and      antibiotics, antitussives, and bronchodilators.  2. Cycle cardiac enzymes and repeat ECG.  3. 2-D echocardiogram, rule out pericardial effusion (doubt).  4. Consider adenosine Cardiolite if chest pain has not improve with      these measures.   FINDINGS:  Mr. [name] is a pleasant 60 year old, African American  gentleman who has severe chronic history as noted above.  He admits for  the past month to having a cough that was generally nonproductive or  would be productive of some yellow sputum occasionally.  The cough is  worsened recently and he has developed associated chest pain both with  the cough and with any deep breathing.  He tries to avoid deep  breathing.  He  has also had some chest cramping on both sides of his  chest that sometimes radiates to the left arm.  This is spontaneous in  nature.  He cannot recall exactly he last time this occurred, but some  time within the last 4-6 weeks.  Interestingly, he states that this pain  in his chest including the pleuritic component is typical for his  presentations with coronary artery disease and subsequent stenting.   His other complaints are that of chronic low back pain for which he is  disabled and occasional pain radiating down into the left leg which  someone has told him that it was sciatica.   OUTPATIENT MEDICATIONS:  1. Plavix 75 mg p.o. daily.  2. Amlodipine 5 mg p.o. daily.  3. Metoprolol 25 mg p.o. b.i.d.  4. Pravastatin 80 mg p.o. daily.  5. Mirtazapine 15 mg p.o. nightly.   INPATIENT MEDICATIONS:  1. Albuterol and Atrovent nebulization q.6 hours.  2. Azithromycin 500 mg p.o. daily.  3. Ceftriaxone 1 g IV daily.  4. Morphine sulfate p.r.n. 4 mg.   DRUG ALLERGIES:  None known.   FAMILY HISTORY:  Positive for coronary artery disease in his father,  hypertension in both parents, and diabetes in his mother.  There is no  history of cancer that he is aware of.   SOCIAL HISTORY:  He continues to smoke, now down to about 7-6 cigarettes  a day.  He admits that he has not been able to purchase his nicotine  patches lately.  He has a rare alcohol use.  He reports past history of  cocaine, but none recently.  He is disabled and lives here in  [place].  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   REVIEW OF SYSTEMS:  Negative except as mentioned above.  He denies any  visual changes or headache.  No hemoptysis.  No sore throat.  No  difficulty swallowing.  No nasal congestion.  No diarrhea.  He has not  been particularly short of breath.  He is able to perform his ADLs  without difficulty.  He denies any abdominal pain, diarrhea,  constipation,  hematochezia, or melena.  No dysuria, hematuria, or  nocturia.  He has the chronic low back pain as mentioned with extension  of left leg.  No other joint pain.  No muscle weakness.  No numbness or  tingling.  Occasionally, he has a cramping in both hands and difficulty  breathing which he attributes to an anxiety attack.  Otherwise, he  sleeps adequately usually, but not always using his CPAP mask.   PHYSICAL EXAMINATION:  VITAL SIGNS:  Blood pressure is 123/81.  Pulse is  90 and regular.  Respiratory rate 18.  Temperature 97.7.  GENERAL:  This is a pleasant well-appearing 60 year old African American  male, in no distress.  HEENT:  Unremarkable.  Head is atraumatic and normocephalic.  Pupils are  equal, round, and reactive to light.  Extraocular movements are intact.  No scleral icterus.  Oral mucosa is pink and moist.  Teeth and gums in  adequate repair.  Tongue is not coated.  NECK:  Supple without thyromegaly or masses.  The carotid upstrokes are  normal without bruit.  There is no JVD.  CHEST:  Diminished breath sounds bilaterally with prolonged expiratory  phase.  No wheezes or rales.  HEART:  Regular rhythm.  Normal S1 and S2 is heard.  No murmur, click,  or rub.  The chest wall is tender to mild palpation anteriorly.  ABDOMEN:  Soft and nontender.  No midline pulsatile mass.  Bowel sounds  present in all quadrants.  EXTERNAL GENITALIA:  Without lesions.  RECTAL:  Not performed.  EXTREMITIES:  Full range of motion.  No edema and intact distal pulses.  NEUROLOGICAL:  Cranial nerves II-XII intact.  Motor and sensory grossly  intact.  Gait not tested.  SKIN:  Warm, dry, and clear.   ACCESSORY CLINICAL DATA:  White blood cell count 8300 and hemoglobin  16.6.  Serum electrolytes, BUN, creatinine, and glucose all within  normal limits.  Chest x-ray, no active cardiopulmonary disease.  CK-MB  and troponin x1 are negative.  EKG, normal sinus rhythm, normal EKG.   COMMENTS:  This  seems to be entirely musculoskeletal and pleuritic in  nature, probably related to chronic cough and longstanding bronchitis.  No evidence of pneumonia.  Concerning that he states that this is  somewhat similar to his previous cardiac pain although findings are  quite atypical and there are no objective indications that this is due  to coronary ischemia.   RECOMMENDATIONS:  For further evaluation as noted above.      Francisca December, M.D.  Electronically Signed     JHE/MEDQ  D:  12/07/2008  T:  12/08/2008  Job:  [id number]   cc:   Renaye Rakers, M.D.  Georga Hacking, M.D.

## 2010-09-29 NOTE — H&P (Signed)
NAMETEDRICK, PORT NO.:  [id number]   MEDICAL RECORD NO.:  [id number]          PATIENT TYPE:  EMS   LOCATION:  MAJO                         FACILITY:  [facility]   PHYSICIAN:  Edward Lav, MD  DATE OF BIRTH:  1950/12/20   DATE OF ADMISSION:  06/22/2008  DATE OF DISCHARGE:                              HISTORY & PHYSICAL   PRIMARY CARE PHYSICIAN:  [name], M.D.   CHIEF COMPLAINT:  Chest pain, dyspnea, anxiety.   HISTORY OF PRESENT ILLNESS:  [name] is a 60 year old African  American gentleman with a past medical history significant for known  coronary disease status post placement of 2 stents in [place] and  2 additional stents at [facility] in 2000, also a  history of COPD, hypertension, hyperlipidemia and status post resection  of perianal intra-anal condyloma who presents with a 17-month history of  rather atypical fleeting chest pain which I will describe, and then also  a chest pressure that is a squeezing sensation that has been going on  for about a month.  [name] was previously on Plavix, aspirin, a  statin and was also on Mavik.  He has been unable to afford any of these  medications ever since he lost employment with [employer].  He has  been out of the Plavix for about half a year and has not been on any  other medication for about a month now, having recently taken [facility] for  blood pressure.  He also had been on albuterol for his COPD and Spiriva.  He does have some albuterol at home, which he takes about twice a day.  Over the last month he has experienced a cramping sensation in his arms  and legs accompanied by a cramping sensation in his chest that goes  across his back.  It also goes into his neck and is accompanied by  nausea.  He has rated this pain as about an 8/10 sensation and describes  it as a cramping tightness type of sensation.  It typically lasts for 30  seconds to a minute.  It has come on  with exertion when he tries to run  on his treadmill at home and typically did not occur at rest.  Additionally, for the last month he has had a chest tightness which he  describes as someone holding my heart and squeezing it which he rates  about 5/10 in severity, which has been persistent for the last week,  only abating when he sleeps.  This chest tightness makes it difficult  for him to take a deep breath and also is accompanied by dyspnea.  He  asked his friend to bring him to the emergency department today because  the first type chest pain was happening with increasing frequency and  the severity had increased the pains from 7/10 to 8 and 9 out of 10 in  severity.  Additionally, he felt profoundly dyspneic and was using his  home oxygen more frequently and even when he used it, he felt out of  breath.  He states that he has  home oxygen set up for him but he is not  using it around the clock but rather when he feels anxious or out of  breath.  He denies fevers, although he was diaphoretic last night.  He  has had nausea with the fleeting chest pain and also has had nausea and  independently with this and vomiting 5 times this week, including this  morning when he vomited his food.  He has not seen any blood or not seen  any bilious material in the vomit.   The patient came to the [facility] Emergency Department, where initially  he had poor airway exchange but was saturating 96% on room air.  He has  been given albuterol and ipratropium breathing treatment as well as 4  baby aspirins, sublingual nitroglycerin and now Nitrol paste.  He states  that the fleeting chest pain is gone, although he still has the chest  pressure, and this has not been relieved by the Nitrol paste.  He  endorses having used Viagra 3 days ago but denies having used it today.  He also endorses having used cocaine 4 days ago and also endorses  marijuana use within the last few days.  He continues to smoke a  few  cigarettes a day.  He denies any intravenous drug use.   PAST MEDICAL HISTORY:  1. Coronary artery disease, status post stents x2 in [place]      and x2 at [facility].  2. Hypertension.  3. Hyperlipidemia.  4. COPD.  5. Anxiety.  6. Hemorrhoids.  7. Anal condyloma, status post cauterization and excision having been      performed on Oct 09, 2004.  8. Moderately severe obstructive sleep apnea.  Was supposed to be on      CPAP previously.  9. History of prior hemorrhagic cystitis and BPH.   SURGICAL HISTORY:  As described above.   SOCIAL HISTORY:  The patient is sexually active with his girlfriend.  He  denies having sex with men.  He denies intravenous drug use.  He used to  work for [employer].  He endorses ongoing cigarette use, marijuana  use, occasional cocaine use and alcohol, but not daily.   FAMILY HISTORY:  He has a sister who also has coronary artery disease.   CURRENT MEDICATIONS:  None.   ALLERGIES:  NO KNOWN DRUG ALLERGIES.   REVIEW OF SYSTEMS:  As described above, otherwise 10-point review of  systems negative.   PHYSICAL EXAMINATION:  Blood pressure 105/77, pulse 114, respirations  22, initial pulse ox 96% on room air, now currently receiving albuterol  nebulized treatment.  GENERAL:  A pleasant gentleman, alert and oriented x4.  HEENT:  Normocephalic, atraumatic.  Pupils equal, round and reactive to  light.  Sclerae anicteric.  Oropharynx slightly dry.  No erythema or  exudate.  CARDIOVASCULAR:  Exam revealed tachycardia but no murmurs, gallops or  rubs heard.  LUNGS:  Prolonged expiratory phase.  He does have end expiratory wheezes  that are audible now.  ABDOMEN:  Soft, nondistended, nontender, positive bowel sounds.  EXTREMITIES:  Without edema.   LABORATORY DATA:  EKG performed today shows sinus tachycardia with right  atrial enlargement.  It also shows an old infarct in his inferior leads  with Q-waves in II, III  and aVF.  He now has some T-wave inversion in V6  which he did not have on an EKG performed in February of 2009.  He has a  J-point  elevation in V3.  He has less than a millimeter of ST elevation  in II, III and aVF which was present on prior EKG.   LABORATORY DATA:  A chest x-ray showed some chronic interstitial changes  but no infiltrates or edema.  CBC:  White count 7.5, hemoglobin 16.7,  platelets 275.  Metabolic panel essentially normal except for a glucose  of 126.  BUN and creatinine are 13 and 1.1.  Potassium 4.1.  Cardiac  markers at 12:38 were negative and also negative at 14:26.   ASSESSMENT/PLAN:  This is a 60 year old [place] American gentleman with  known coronary disease, status post stenting in [place] and at  [facility] who has hypertension, hyperlipidemia, also has COPD and  has not been on any medications other than albuterol, which he has used  twice daily for some time.  1. Chest pain. He has 2 types of chest pain:  Fleeting chest pain that      is less typical and then a squeezing sensation that is made worse      with deep breathing.  Differential for his chest pain includes      coronary disease given he has known coronary disease with stents.      Additionally, certainly his chronic obstructive pulmonary disease      exacerbation could be causing his chest discomfort.  He also has      known anxiety as well.  Additionally, finally pulmonary embolism is      possible.  He is tachycardiac, although was not hypoxic when he      arrived.  I am going to cycle his cardiac enzymes.  I am going to      place him on Norvasc.  I am going to stop his Nitro-paste for now.      He did not take Viagra today but I would like to see how he does      off the Nitrol paste.  I am going to check a blood gas now to see      how much C02 he may be retaining and how hypoxic he may be.      Afterwards I will give him some morphine as needed for chest pain      and consider  nitrates again, although the squeezing chest pain      seems to not respond to this.  2. I will also start him on Lipitor.  I will give him full-dose      aspirin.  I am going to hold off on Plavix today prior to cardiac      consultation.  He was seen not at [facility] as he initially      claimed, but at [facility].  Will try to get records from [facility].  I      will check a fasting lipid profile in the morning.  He will need      risk stratification.  I will talk to cardiology; if not today,      tomorrow morning.  We will also check a 2-D echocardiogram.  3. I will also check a D-dimer, although my suspicion for pulmonary      embolism is not terribly high.  4. Chronic obstructive pulmonary disease exacerbation.  I am going to      give him albuterol and ipratropium nebs q.6 h. schedule and      albuterol q. 2 p.r.n. and I will give him Solu-Medrol 125 mg every  8 hours.  I am checking a blood gas now.  5. Hyperlipidemia.  I am checking a fasting lipid profile and put him      on Lipitor.  6. Hypertension.  I will put him on Norvasc.  For now I am going to      avoid beta-blockers given recent cocaine use.  I am also going to      be careful with nitrates, although he did not take Viagra today.  7. History of polysubstance abuse.  I will give him smoking cessation      counseling.  I will check a tox screen from his urine.  8. Health care screening.  I am going to check him for human      immunodeficiency virus.  He is totally agreeable to this.  I am a      little more suspicious of this given he has had an anal condyloma      excised here previously.  9. Prophylaxis.  He will be on heparin 5000 t.i.d.  Because of his      high-dose steroids, I will put him on a proton pump inhibitor given      he is on high-dose steroids.  10.Code status.  The patient is full code.      Edward Lav, MD  Electronically Signed     CV/MEDQ  D:  06/22/2008  T:  06/22/2008  Job:   [id number]

## 2010-09-29 NOTE — Consult Note (Signed)
NAMESEAMUS, WAREHIME NO.:  [id number]   MEDICAL RECORD NO.:  [id number]          PATIENT TYPE:  INP   LOCATION:  2912                         FACILITY:  [facility]   PHYSICIAN:  Antonietta Breach, M.D.  DATE OF BIRTH:  Jun 27, 1950   DATE OF CONSULTATION:  06/26/2008  DATE OF DISCHARGE:                                 CONSULTATION   REASON FOR CONSULTATION:  Anxiety.   HISTORY OF PRESENT ILLNESS:  Mr. Faircloth is a 60 year old male admitted  to the [facility] on June 22, 2008, due to chest pain and chronic  obstructive pulmonary disease.   Mr. Mose has been troubled by some disagreements with his girlfriend.  She has been suspicious of any gestures that he makes or conversation  that he has with other females.  They have had some arguments about  this.  He has been worried about the future of their relationship.  He  has initially lost some sleep about it.  He has not had any thoughts of  harming himself or others.  He has not had any hallucinations or  delusions.   Initially, some of the medical team was concerned, that he appeared to  be excessively resistant towards talking about his difficulty.  However,  the patient explains that it has been difficult to put his concern into  words.   He has constructive future goals and interests.  He is oriented to all  spheres and his memory function is intact.  He does acknowledge that he  does have a past psychiatric history which contributed to his resistance  towards seeing the undersigned.  The difficulties he had are now remote.   PAST PSYCHIATRIC HISTORY:  Several years ago Mr. Baranek did attempt to  kill himself.  He was admitted to a psychiatric unit and was treated  with a medication for depression.  He does not recall the name of the  medication.  He has not had a recurrence of depression since that time.   Mr. Urey also has been worried about his general medical problems  including his coronary  artery disease with a history of myocardial  infarction.   He does not have any panic attacks.  He is able to relax himself.   In review of the past medical record in March 2009, anxiety is listed.  At that time, they were utilizing Ativan p.r.n.   Mr. Pulse has no history of hallucinations.   FAMILY PSYCHIATRIC HISTORY:  None known.   SOCIAL HISTORY:  Mr. Tumolo has four grown children, 13 grandchildren.  Occupation:  Medically retired.  Religion is Baptist.  He has been  living with his girlfriend.  He is retired from [employer] and is  medically disabled.  He occasionally will use alcohol.  He also has  experimented with cocaine and marijuana.   PAST MEDICAL HISTORY:  1. BPH.  2. Hypertension.  3. Hyperlipidemia.  4. Chronic obstructive pulmonary disease.  5. History of hemorrhoids.  6. Coronary artery disease with stent placement.  7. History of condyloma acuminata.   MEDICATIONS:  MAR is reviewed.  Mr. Matherne  did receive a dosage of  Valium this morning at 10 mg.   ALLERGIES:  NO KNOWN DRUG ALLERGIES.   LABORATORY DATA:  Urine drug screen was positive for marijuana.  Sodium  138, BUN 15, creatinine 0.96, glucose 103, WBC 14.5, hemoglobin 16.1,  platelet count 273, free T4 was decreased at 0.85, SGOT 34, SGPT 27.  Initial urine drug screen on February 6 was positive for cocaine,  opiates and tetrahydrocannabinol.   REVIEW OF SYSTEMS:  Constitutional, Head, Eyes, Ears, Nose And Throat,  Mouth, Neurologic, Psychiatric, Cardiovascular, Respiratory,  Gastrointestinal, Genitourinary, Skin, Musculoskeletal, Hematologic,  Lymphatic, Endocrine, Metabolic all unremarkable.   PHYSICAL EXAMINATION:  VITAL SIGNS:  Temperature 97.8, pulse 78,  respiratory rate 18, blood pressure 112/72, O2 saturation on 2 liters  99%.  GENERAL APPEARANCE:  Mr. Bagheri is a middle-aged male sitting up in his  hospital chair with no abnormal involuntary movements.   MENTAL STATUS EXAM:   Mr. Soltau is alert.  His eye contact is good.  He  has normal attention and concentration.  His affect is slightly anxious  at baseline, but with a broad and appropriate range as the conversation  proceeds.  He clearly does get relief from talking further about his  concern.  His mood is slightly anxious at the beginning, but improves as  the conversation proceeds.  He is oriented to all spheres.  His memory  is intact to immediate recent and remote.  His fund of knowledge and  intelligence are normal.  His speech involves normal rate and prosody  without dysarthria.  Thought process is logical, coherent, goal-  directed.  No looseness of associations.  Thought content:  No thoughts  of harming himself or others no delusions or hallucinations.  Insight is  intact judgment is intact.   ASSESSMENT:  AXIS I:  (293.84)  Anxiety disorder not otherwise  specified.  Mr. Riner has had some acute use of cocaine which can  contribute to severe anxiety, also, tetrahydrocannabinol can contribute  as well.  However, he does appear to have had some reactive anxiety  symptoms regarding relationship difficulty as well as an exacerbation of  his general medical condition.  He may have a generalized anxiety  component.  Polysubstance abuse versus dependence.  AXIS II:  Deferred.  AXIS III:  See past medical history.  AXIS IV:  General medical primary support group.  AXIS V:  55.   Mr. Schoenberger is not at risk to harm himself or others.  He does agree to  call emergency services immediately for any thoughts of harming himself,  thoughts of harming others or distress.   The undersigned provided ego supportive psychotherapy and education.   The undersigned recommended that Mr. Hopkins received some counseling  with cognitive behavioral therapy, deep breathing and progressive muscle  relaxation for anxiety.   Regarding his substance abuse, the community has a number of resources  including  residential rehabilitation programs and chemical dependence  intensive outpatient programs at [facility], [facility],  [facility].   The social worker could help arrange one of these programs for Mr.  Cudd.   Also would recommend 12-step meetings and principles.   Would avoid psychotropic medication with this kind of presentation, Mr.  Rickel does not respond to psychotherapy, or he develops severe anxiety  symptoms such as panic attacks.      Antonietta Breach, M.D.  Electronically Signed     JW/MEDQ  D:  06/27/2008  T:  06/28/2008  Job:  [phone]

## 2010-09-29 NOTE — H&P (Signed)
NAMEKARMA, HINEY NO.:  [id number]   MEDICAL RECORD NO.:  [id number]          PATIENT TYPE:  INP   LOCATION:  1832                         FACILITY:  [facility]   PHYSICIAN:  Jennelle Human. Marisue Humble, MD DATE OF BIRTH:  Mar 03, 1951   DATE OF ADMISSION:  07/15/2007  DATE OF DISCHARGE:                              HISTORY & PHYSICAL   PRIMARY CARE PHYSICIAN:  Renaye Rakers, M.D..   CHIEF COMPLAINT:  Gradual onset of shortness of breath.Marland Kitchen   HISTORY OF PRESENT ILLNESS:  Mr. Raineri is a 60 year old gentleman with  a history of COPD, coronary artery disease, in which he had an MI in  1999 and 2000, status post multiple stents, hypercholesterolemia and  hypertension, who presents with gradual shortness of breath since  Thursday.  He stated that, this last Monday, he got sick at work, had a  upper respiratory-type symptoms, mostly involving his nasal passages and  upper airway.  He ran fever and chills.  It gradually became worse  until, to the point of Wednesday, he was unable to sleep or breathe,  secondary to shortness of breath.  He has been gasping for air for the  last several days, until today he could not take it any longer and  presented to the emergency room.  He states that the only medication  that he has been taking is aspirin, and he has not been taking any of  his prescription medications.   PAST MEDICAL HISTORY:  1. Myocardial infarction in 1999 and year 2000.  He received 4 stents      at an outside hospital in [place].  It is unknown where      these stents were placed, with regard to his coronary anatomy.  2. COPD.  The patient was previously on Singulair, questionable      reactive airway component of his COPD.  3. Hypercholesterolemia.  4. Hypertension.  5. Hemorrhoids.   REVIEW OF SYSTEMS:  Otherwise negative, except for that stated above.   SOCIAL HISTORY:  He works as a Land.  He recently quit  smoking one week ago, when this  illness started but otherwise has had  tobacco abuse previous and no alcohol or illicit drug use.   FAMILY HISTORY:  Positive for early heart disease.  No family history of  cancer.   MEDICATIONS:  Aspirin 81 mg.   ALLERGIES:  NO KNOWN DRUG ALLERGIES.   PHYSICAL EXAMINATION:  VITAL SIGNS:  His blood pressure is 117/85, with  a pulse of 123.  His respirations are anywhere from 22 to 24.  He is  satting 96% currently on the BiPap.  GENERAL:  He is in moderate respiratory distress.  He had received some  nebulized treatments earlier but is now shortness of breath again.  HEENT:  Pupils are equally round, reactive to light and accommodation.  Oropharynx is intact.  NECK:  Supple without lymphadenopathy, masses or bruits.  JVD is normal.  CHEST:  He has tight bilateral wheezes without appreciable rales.  HEART:  Exam regular rate and rhythm.  There is no S3 or any other  gallops.  ABDOMEN:  Soft, nondistended, nontender.  Clear bowel sounds.  No  hepatosplenomegaly.  EXTREMITIES:  No clubbing, cyanosis or edema.   LABORATORY EXAMINATION:  His D-dimer was negative.  His BNP was normal  at 40.  He had a venous gas that had a pH of 7.3, with a venous PCO2 of  47.  Cardiac enzymes are negative.  Sodium is 129, potassium 3.4,  chloride 107, BUN 5, glucose 165, hemoglobin 17.3 and hematocrit 51.  Chest x-ray shows hyperaeration.  No focal consolidations, no  cardiomegaly or evidence of pulmonary edema.   ASSESSMENT/PLAN:  1. Respiratory distress, likely secondary to chronic obstructive      pulmonary disease/reactive airway disease.  It is unlikely that he      has had a PE, given his normal D-dimer and the gradual onset of      symptoms.  It is also unlikely that he is in congestive heart      failure, as he has no gallop on exam.  He has no JVD, no      hepatomegaly and no lower extremity edema.  In addition, his BNP is      normal, which has a great negative predictive value.  On exam,  he      has tight wheezes and reacted positively to his first nebulized      treatment.  Therefore, we will treat him for chronic obstructive      pulmonary disease exacerbation with Levaquin, Solu-Medrol 125 q.6      hours.  We will also keep him on BiPap at 10/5.  We will get an ABG      now and an ABG later, as he looks quite fatigued, and I am worried      about him possibly tiring out.  We will get nebulized treatments      every 2 hours.  We will watch him in the intensive care setting,      until he improves, which will hopefully be some time tomorrow.  2. Coronary artery disease.  He has a history of MI in 1999 and 2000.      He has received 4 stents, but it is unknown to me where these      stents are placed, in regards to his coronary anatomy.  He has not      had any chest pain.  He does not have any symptoms of congestive      heart failure.  We will continue his aspirin at this time but will      not initiate beta blockers, secondary to his reactive airway      disease.  His cardiac enzymes are negative, and his EKG appears      normal.  We will, however, obtain an echocardiogram to get an      assessment of his left ventricular ejection fraction which I assume      is normal.  However, he is at a setup for diastolic dysfunction,      which we can assess nicely by tissue Doppler imaging on his echo.  3. Hypertension, currently under control.  We will not initiate      treatment at this time, as he has had some blood pressures as low      as 97/60s.  He is currently 117 systolic.  4. Hyperlipidemia.  We will reassess his lipids this admission and      initiate Statin therapy, if needed.  ______________________________  Jennelle Human Marisue Humble, MD     GBS/MEDQ  D:  07/15/2007  T:  07/15/2007  Job:  [id number]

## 2010-09-29 NOTE — Consult Note (Signed)
[name], [name] NO.:  [id number]   MEDICAL RECORD NO.:  [id number]          PATIENT TYPE:  INP   LOCATION:  3739                         FACILITY:  [facility]   PHYSICIAN:  Georga Hacking, M.D.DATE OF BIRTH:  May 11, 1951   DATE OF CONSULTATION:  06/25/2008  DATE OF DISCHARGE:                                 CONSULTATION   Extensive medical records were reviewed from the [facility].  The patient was admitted on February 11, 2001 with a  inferolateral myocardial infarction.  A drug screen was positive for  opiates and cocaine.  He received TNK and tPA and later had a negative  stress test.  He was readmitted with recurrent pain and at  catheterization had a 75% mid-LAD stenosis and an occluded first  marginal branch.  He underwent stenting at [facility] with an unknown  stent to the LAD and the first marginal branch at that time and I  suspect that this was a non-drug-eluting stent.  Additional records from  December 2003 when he was admitted with an acute coronary syndrome were  obtained.  At that point in time, he had in-stent restenosis of the LAD  and had a intravascular ultrasound done that showed a 4.3 mm2 minimal  luminal area with a 30-50% in-stent restenosis in the LAD stent.  The  circumflex marginal intravenous ultrasound showed a minimal luminal area  of 3.1 mm2 and an in-stent stenosis of 50-70%.  At that time, he had  placement of a 3.5 x 28 mm Cypher stent with a negative angiographic  result.  There was an AV circumflex with a 70-90% mid vessel stenosis  that was stented with a 5.0 x 13 mm Ultra stent.  He had a good  angiographic result following that.  Evidently, issues of compliance  were noted previously.  He also was recorded as having apical dyskinesis  and an ejection fraction of 50% at that time.      Georga Hacking, M.D.  Electronically Signed     WST/MEDQ  D:  06/25/2008  T:  06/26/2008  Job:  [id number]

## 2010-09-29 NOTE — Discharge Summary (Signed)
[name], HARRIOTT NO.:  [id number]   MEDICAL RECORD NO.:  [id number]          PATIENT TYPE:  INP   LOCATION:  5526                         FACILITY:  [facility]   PHYSICIAN:  Hettie Holstein, D.O.    DATE OF BIRTH:  1951/03/14   DATE OF ADMISSION:  07/15/2007  DATE OF DISCHARGE:  07/20/2007                               DISCHARGE SUMMARY   PRIMARY CARE PHYSICIAN:  Dr. Renaye Rakers.   FINAL DIAGNOSES:  1. Chronic obstructive pulmonary disease exacerbation.  2. Anxiety.  3. Lipomatous lesions on both temple areas that he has requested      excision.  I have asked General Surgery to stop by and schedule an      outpatient clinic visit to address these, or he may perhaps need a      general surgery referral, and this is currently pending.  4. History of coronary artery disease with stents placed in the past.  5. Hypercholesterolemia.  6. Hypertension.  7. Anxiety.  8. Hemorrhoids.   MEDICATIONS ON DISCHARGE:  1. Prednisone taper beginning at 30 mg tapering by 10 mg q.3 days      until complete.  2. Avelox to conclude 10 days of treatment 5 days of therapy.  3. Ativan 1 mg p.o. b.i.d. p.r.n. anxiety.  4. Mucinex 600 mg twice daily times 1-2 weeks.  He can use this as      needed.  5. Combivent 2 puffs t.i.d.  6. Aspirin 81 mg daily.   DISPOSITION:  1. It is recommended to followup with Dr. Renaye Rakers this week, and      depending on whether or not [facility] plans to      address these issues on his temple areas, we will refer him to      either dermatologist or plastic surgeon.  This is currently      pending.  2. We are awaiting staff to perform an oxygen prescription test to      also have home health assist with provision of oxygen in the home.      Smoking cessation was emphasized.   HISTORY OF PRESENT ILLNESS:  For full details, please refer to the  history and physical as dictated by Dr. Marisue Humble; however, briefly, Mr.  [name] is a  60 year old male with history of COPD, coronary artery  disease in which he had MI in 1999-2000 status post multiple stents,  hypercholesterolemia, and hypertension who presented with gradual  shortness of breath since Thursday.  He stated that he had become ill at  work with upper respiratory type symptoms involving his nasal passages  and upper airway.  He had some fevers and chills.  This had worsened.  He was unable to breathe secondary to shortness of breath.  He began  gasping for air, and he was found in the Emergency Department to be in  respiratory distress.  He underwent thorough evaluation including D-  dimer, chest radiograph.  D-dimer was negative, and he was placed on  BiPAP for COPD exacerbation.  His symptoms did improve.  He was tapered  on IV Solu-Medrol and antibiotics as well as frequent nebulizer  treatments.  His BNP was  40, and cardiac markers again were also reported as negative.  He  underwent a 2-D echocardiogram that revealed an ejection fraction of 60-  65%.  He is quite fixated on having his facial lesions addressed during  this acute hospitalization.  I have asked General Surgery to see him and  perhaps schedule something in the outpatient setting.      Hettie Holstein, D.O.  Electronically Signed     ESS/MEDQ  D:  07/20/2007  T:  07/20/2007  Job:  [id number]   cc:   Renaye Rakers, M.D.

## 2010-10-02 NOTE — Procedures (Signed)
[name], CORRIHER NO.:  [id number]   MEDICAL RECORD NO.:  [id number]          PATIENT TYPE:  OUT   LOCATION:  SLEEP CENTER                 FACILITY:  [facility]   PHYSICIAN:  Clinton D. Maple Hudson, M.D. DATE OF BIRTH:  April 19, 1951   DATE OF STUDY:  09/09/2004                              NOCTURNAL POLYSOMNOGRAM   REFERRING PHYSICIAN:  Renaye Rakers, M.D.   INDICATIONS FOR STUDY:  Hypersomnia with sleep apnea. Epworth sleepiness  score 7/24, BMI 28, weight 195 pounds.   SLEEP ARCHITECTURE:  Total sleep time 310 minutes with sleep efficiency of  79%. Stage I was 9%, stage II was 71%, stages III and IV were absent, REM  was 20% of total sleep time. Sleep latency was 28 minutes. REM latency 64  minutes. Awake after sleep onset 55 minutes. Arousal index 16. No  medications were taken.   RESPIRATORY DATA:  Split study protocol. Respiratory disturbance index (RDI,  AHI) was 28.3 obstructive events per hour indicating moderately obstructive  sleep apnea/hypopnea syndrome before CPAP. All the events were hypopneas  with 61 reported for CPAP. The events were not positional. REM RDI was 35.  CPAP was titrated to 14 CWP, RDI 0 per hour, using a large Respironics  Comfort full face mask, series II, with heated humidifier.   OXYGEN DATA:  Moderate snoring with oxygen desaturation to a nadir of 56%  before CPAP. After CPAP control saturation held 91% to 95% on room air.   CARDIAC DATA:  Normal sinus rhythm with occasional PVCs.   MOVEMENT/PARASOMNIA:  Occasional leg jerk with arousal, clinically  insignificant.   IMPRESSION/RECOMMENDATIONS:  1.  Moderately severe obstructive sleep apnea/hypopnea syndrome, RDI 28.3      per hour with moderate snoring and oxygen desaturation to 56%.  2.  Successful CPAP titration to 14 CWP, RDI 0 per hour, using a large      Respironics Comfort full face series II mask with heated humidifier.      CDY/MEDQ  D:  09/13/2004 13:43:04  T:   09/13/2004 21:54:12  Job:  [id number]

## 2010-10-02 NOTE — Discharge Summary (Signed)
[name], WESTRUP NO.:  [id number]   MEDICAL RECORD NO.:  [id number]          PATIENT TYPE:  INP   LOCATION:  6733                         FACILITY:  [facility]   PHYSICIAN:  Michaelyn Barter, M.D. DATE OF BIRTH:  1951/05/02   DATE OF ADMISSION:  07/15/2004  DATE OF DISCHARGE:  07/19/2004                                 DISCHARGE SUMMARY   PRIMARY CARE PHYSICIAN:  Renaye Rakers, M.D.   FINAL DIAGNOSES:  1.  Citrobacter koseri/urosepsis.  2.  Citrobacter koseri bacteremia.  3.  Hemorrhagic cystitis.  4.  Abnormal CT of the abdomen.  5.  Elevated prostate, specific antigen.   HISTORY OF PRESENT ILLNESS:  Mr. [name] is a 60 year old gentleman with a  past medical history of MI, hypertension, hypercholesterolemia, who arrived  with a chief complaint of fever, chills, one day in duration.  He stated  that he developed hematuria approximately four days prior to this admission  with urinary urgency and incontinence.  Likewise, left sided flank pain  began shortly there afterwards described as sharp in nature and lasting  several minutes.  In addition, he complained of left costovertebral  tenderness and erectile pressure.   PAST MEDICAL HISTORY:  1.  MI in 1999 and 2000.  2.  Four stents have been placed in the patient's coronary.  3.  Hypercholesterolemia.  4.  Hypertension.  5.  Hemorrhoids.  6.  Questionable COPD.  7.  The patient was recently started on penicillin for a toothache.   FAMILY HISTORY:  Father had CAD requiring replacement of stents.  The  patient's sister also has history of CAD requiring stents.   SOCIAL HISTORY:  Cigarettes:  The patient smoked for approximately 35 years,  and currently smokes approximately 5 sticks per day.   HOSPITAL COURSE:  #1 - Citrobacter koseri/urinary tract infection/urosepsis.  The patient was initially admitted with the diagnosis of left sided  pyelonephritis.  He had both urine cultures and blood cultures sent  to the  laboratory.  [name] blood cultures were confirmed on July 15, 2004,  to be Citrobacter koseri.  Likewise, the patient's urine culture was  confirmed to be Citrobacter koseri greater than 100,000 colonies.  This  organism was noted to have been sensitive to ceftriaxone.  Therefore, it was  initiated.  Following this, data indicated that the organism was also  sensitive to ciprofloxacin, so, the patient was subsequently switched from  ceftriaxone to ciprofloxacin 500 mg p.o. b.i.d., and the patient appeared to  have tolerated that medication well.  Over the course of his  hospitalization, the patient was noted to have spiked fevers.  By July 17, 2004, his maximum temperature was 101.1.  However, by the following date,  his temperature was noted to have spiked up to 100.8.  In addition, the  patient had a CT scan of the abdomen completed because of his complaint of  left sided abdominal pain, and this was done on July 16, 2004.  The final  impression of the CT of the abdomen without contrast was that there was  scattered air fluid levels in  the nondilated small bowel suggesting a mild  ileus.  Low level edema signal is present in the root of the mesentery.  This was reported to be a nonspecific finding.  In addition, the patient  also had a CT of the pelvis completed, final results of which were:  a.  Wall thickening focally in sigmoid colon, cannot exclude mass or  colitis.  b.  Moderate prominence of the prostate gland which measures 6.4x 5.7 cm in  size.  c.  Probable enchondroma in the left ileum.   #2 - HEMORRHAGIC CYSTITIS.  Because of the findings on the CT scan in  addition to the hematuria that the patient was displaying, and a elevated  PSA 29.41, the decision was made to consult urology, and Dr. Vernie Ammons was the  urologist who responded to the consultation.  He stated that the patient had  acute hemorrhagic cystitis, and this was responding to the antibiotics.   Likewise, he also stated that the patient presented with a benign prostate  hypertrophy by exam and displayed no significant obstructive symptoms, and  in addition, the patient also displayed small bilateral inguinal hernias.  He agreed with continuing with the antibiotics that had been initiated, and  with understanding that the patient may need cystoscopy if his hematuria  persisted.  His final note, which was completed on March 5, indicated that  the patient did have urosepsis and possibly even mild prostatitis, and noted  that the elevated PSA was of little value, given the fact that the patient  did have an active UTI in addition to possible prostatitis, and he  recommended continuation of the p.o. antibiotics for a total of 30 days to  treat the prostatitis and stated he could follow up the patient's hematuria  and elevated PSA as an outpatient.  Therefore, because the hematuria did  resolve, over the course of the hospitalization, the decision was made to  allow the patient to follow up further as an outpatient with urology.   #3 - Abnormal CT of the abdomen.  Because of the abnormal findings of the  abdominal CT, Gastroenterology was consulted, and Dr. Russella Dar was the  physician who responded to the consultation.  It was noted, after talking to  Dr. Russella Dar, however, that the patient had outpatient appointment to see Dr.  Jeani Hawking the following week.  Therefore, the decision was made to allow  the patient to be discharged from the hospital and to follow up with Dr.  Jeani Hawking as an outpatient.  In addition, I spoke to the patient's  primary care physician, Dr. Renaye Rakers, multiple times, and she stated that  she had known about this appointment with Dr. Elnoria Howard and also suggested that  he probably should follow up.  Therefore, the decision was made to have the  patient follow up with Dr. Elnoria Howard as an outpatient.  #4 - HEMORRHOIDS.  The patient had history of hemorrhoids and did  complain  of some tenderness over the course of his hospitalization.  They were  treated medically during this hospitalization.  However, he was instructed  that he would have to follow up as an outpatient for evaluation of his  hemorrhoids.   CONDITION ON DISCHARGE:  By the date of discharge, the patient's condition  had improved.  He stated that he felt good and had slept very well the night  prior to his discharge.  On the date of discharge, he showed no obvious  distress.   DISCHARGE PHYSICAL  EXAMINATION:  VITAL SIGNS:  At the time of discharge,  temperature 97.6, heart rate 81, respirations 16, blood pressure 138/91.   LABORATORY DATA:  Lab values showed a BUN of 6 and creatinine of 1.0.  Blood  cultures that were completed on July 18, 2004, revealed no growth x2.  Therefore, the decision was made to discharge the patient home.   DISCHARGE MEDICATIONS:  1.  Ciprofloxacin 500 mg p.o. b.i.d. for his urinary tract infection and      blood infection, secondary to Citrobacter koseri.  2.  Plavix 75 mg p.o. daily.  3.  Coreg 25 mg b.i.d.  4.  Dilaudid 2 mg q.6h. p.r.n. pain.  5.  Nicotine patch 14 mg daily.   DISCHARGE INSTRUCTIONS:  1.  He was instructed to see Dr. Renaye Rakers on Wednesday, July 22, 2004.  I      called Dr. Renaye Rakers and discussed the patient's case.  2.  He was also instructed to see Dr. Elnoria Howard and to call for an appointment.  3.  Likewise, he was instructed to call Dr. Margrett Rud office for an      appointment within 1-2 weeks, and given the phone number [phone]).       OR/MEDQ  D:  10/31/2004  T:  11/02/2004  Job:  [id number]   cc:   Renaye Rakers, M.D.  [address]  [place]  [place] [address]  Fax: [phone]

## 2010-10-02 NOTE — Consult Note (Signed)
NAMEZACHARIAH, [name]              ACCOUNT NO.:  [id number]   MEDICAL RECORD NO.:  [id number]          PATIENT TYPE:  INP   LOCATION:  6733                         FACILITY:  [facility]   PHYSICIAN:  Mark C. Vernie Ammons, M.D.  DATE OF BIRTH:  [birth year]   DATE OF CONSULTATION:  07/17/2004  DATE OF DISCHARGE:                                   CONSULTATION   HISTORY OF PRESENT ILLNESS:  The patient is a very pleasant 60 year old  black male who was in a state of his usual good health until Tuesday, when  he developed acute onset of urinary urgency and had an episode of urge  incontinence. He then developed fever, chills, and significant urinary  frequency. He denies any flank pain, nausea or vomiting. The following day,  he was not feeling well and when he urinated, he saw gross hematuria and  came to the emergency room. He has no prior history of stones, urinary tract  infections, prostatitis, or difficulty with control of his bladder. He has  had some dysuria. He denies obstructive voiding symptoms. Has no nocturia.   PAST MEDICAL HISTORY:  Myocardial infarction x2, hypercholesterolemia,  hypertension, hemorrhoids, possible chronic obstructive pulmonary disease  and tooth ache.   PAST SURGICAL HISTORY:  He has had coronary artery stents placed.   ADMISSION MEDICATIONS:  Plavix, Singulair, Coreg, Crestor, albuterol  inhaler, penicillin.   ALLERGIES:  No known drug allergies. He is LACTOSE intolerant.   SOCIAL HISTORY:  Denies alcohol use. He smokes about 5 cigarettes a day. He  does report having smoked up to a pack a day and he has smoked for 35 years.   FAMILY HISTORY:  Significant for coronary artery disease, diabetes. No GU  malignancy or renal disease.   REVIEW OF SYSTEMS:  Negative other than as noted above.   PHYSICAL EXAMINATION:  VITAL SIGNS:  Temperature 100.1, blood pressure  102/57, pulse 105.  GENERAL:  The patient is well developed, well nourished black male in no  acute distress.  HEENT:  Atraumatic, normocephalic. Oropharynx is clear.  NECK:  Supple with midline trachea.  CHEST:  Reveals normal respiratory effort.  CARDIOVASCULAR:  Slightly tachycardiac rate. Regular rhythm.  ABDOMEN:  Soft, nontender without mass or hepatosplenomegaly. He has small  bilateral inguinal hernias that are nontender noted with Valsalva.  GENITOURINARY:  He has a normal circumcised phallus with a normal glans and  meatus. His scrotum is normal. Testicles are descended bilaterally, equal in  size and consistency without lesions. Epididymis is normal.  RECTAL:  He has a normal anus and perineum. He has external hemorrhoids.  Normal rectal tone. His prostate is 3+ enlarged. Smooth, symmetric. Has no  suspicious nodularity or induration. No seminal vesicle abnormality noted.  SKIN:  Warm and dry.  EXTREMITIES:  Without clubbing, cyanosis, or edema.  NEUROLOGIC:  He is alert and oriented with no focal neurologic deficits.   LABORATORY DATA:  Urinalysis had large blood. Was nitrite negative but  leukocyte esterase positive with 11 to 20 white cells and 21 to 50 red  cells. Blood culture so far is positive for gram  negative rods and his urine  has grown greater than 100,000 gram negative rods as well. Creatinine is  normal at 1.4.   CT scan reveals bilaterally normal appearing kidneys. No evidence of mass,  stones, or hydronephrosis. No ureteral stones are seen. The bladder appears  without gross intravesical lesion but an enlarged prostate is noted.   IMPRESSION:  1.  Acute hemorrhagic cystitis, most likely cause of his incontinence and      hematuria. With a positive urine culture and blood cultures, he is      currently on antibiotics and has seen significant improvement in his      urinary frequency and near resolution of his hematuria.  2.  He has benign prostatic hypertrophy by examination but no significant      obstructive symptoms.  3.  He has small bilateral  inguinal hernias.   RECOMMENDATIONS:  1.  Agree with continuing with current antibiotic regimen.  2.  He may need cystoscopy if his hematuria persists, especially since he      has been a cigarette smoker but I would not perform that at this time,      as his recently infected bladder will appear very irritated and possibly      may be indistinguishable from carcinoma in situ. I would therefore      recommend allowing his bladder time to heal and get him over his urinary      tract infection before bladder instrumentation is undertaken.  3.  I note a PSA has been ordered. If it is normal, that is important      information but if it elevated, I will repeat his PSA, since the PSA in      the face of the urinary tract infection may be cause for elevation.      MCO/MEDQ  D:  07/17/2004  T:  07/19/2004  Job:  [id number]

## 2010-10-02 NOTE — H&P (Signed)
[name], [name]              ACCOUNT NO.:  [id number]   MEDICAL RECORD NO.:  [id number]          PATIENT TYPE:  EMS   LOCATION:  MAJO                         FACILITY:  [facility]   PHYSICIAN:  Mobolaji B. Bakare, M.D.DATE OF BIRTH:  April 01, 1951   DATE OF ADMISSION:  07/15/2004  DATE OF DISCHARGE:                                HISTORY & PHYSICAL   PRIMARY CARE PHYSICIAN:  Dr. Renaye Rakers.   CHIEF COMPLAINT:  Fever and chills which started yesterday.   [name]' current problem started with hematuria four days ago with  sudden onset of urinary urgency and incontinence.  It was subsequently  associated with left-sided flank pain, which was sharp in nature and lasted  for a few minutes.  Subsequently, he has been having a dull ache in his left  costovertebral area and also rectal pressure.  He noticed frank hematuria  two days ago.  In addition, he has dysuria and increased frequency of  micturition.  He denies straining at micturition or strangury.  The patient  developed fever and chills yesterday, and he came to the emergency  department.  He denies any problems with his prostate or any kidney stones  in the past.   REVIEW OF SYSTEMS:  Positive for headaches, rectal pressure and hemorrhoids.  He denies cough, chest pain or constipation.  He moves his bowels almost  every day.  However, he does have rectal bleeding after moving his bowels.  No orthopnea or PND.  No pedal swelling.   PAST MEDICAL HISTORY:  1.  MI in 1999 and 2000.  He got four stents put in down in [place].  2.  Hypercholesterolemia.  3.  Hypertension.  4.  Hemorrhoids.  5.  The patient is on Singulair and is being investigated for COPD.  6.  Toothache, for which he was started on penicillin one week ago.   FAMILY HISTORY:  Significant family history of CAD in father and sister.  Father had stents placed, and one sister is going to have stents as well  quite soon.  There is a family history of  diabetes mellitus.  He is single  and lives with his father currently.  He moved from [place] to [place] to live with his father, who is sick.   SOCIAL HISTORY:  The patient has a significant history of smoking.  He  smoked for about 35 years and is currently smoking about five sticks in a  day.  At some point, he was smoking one pack per day.   PHYSICAL EXAMINATION:  INITIAL VITAL SIGNS:  Temperature 102.1, blood  pressure 97/63, pulse 109, respiratory rate 18.  O2 sats of 95%.  GENERAL APPEARANCE:  On examination, the patient appears comfortable.  HEENT:  Normocephalic, atraumatic head.  Not in respiratory distress.  Not  pale.  Anicteric.  NECK:  No cervical lymphadenopathy.  No jugular distention.  LUNGS:  Clear clinically to auscultation.  CARDIOVASCULAR SYSTEM:  S1, S2 regular.  ABDOMEN:  Soft.  Tender in the left lower quadrant and left costovertebral  angle tenderness.  Bowel sounds present.  No hepatosplenomegaly.  EXTREMITIES:  No pedal edema.  No calf tenderness.  No cyanosis or clubbing.  RECTAL:  There are significant external hemorrhoids, which precluded for the  digital examination.  Given the recent history of rectal bleed post  defecation, this was deferred.  CENTRAL NERVOUS SYSTEM:  No focal neurological deficit.   INITIAL LABORATORY DATA:  Urinalysis:  Amber in color, cloudy appearance,  specific gravity of 1.017, large blood, protein 30, nitrites negative,  leukocytes moderate.  Microscopy:  White cells 11-20, red blood cells 21-50.  Blood culture was sent.  CBC and diff:  White cells 19.1, hemoglobin 14.7,  hematocrit 42.4, neutrophils 86, lymphocytes 10, platelets 240.  Creatinine  1.4, BUN 10, glucose 112, chloride 106, potassium 3.5, sodium 137.  Hematocrit 48, hemoglobin 16.3, bicarb 20.6, pH 7.46.   ASSESSMENT/PLAN:  [name] is a 60 year old African-American male with a  history of CAD, hypertension and hyperlipidemia presenting with  dysuria,  left flank pain, fever, chills and a positive UA with hematuria.  He has  left costovertebral angle tenderness.  1.  Left flank pain/hematuria.  We need to rule out ureteric stone.  Obtain      CT of the abdomen and pelvis without contrast.  Dilaudid 1 mg IV q.6h.      p.r.n.  Toradol 15 mg IV q.6h. p.r.n.  2.  Pyelonephritis, left.  Urine culture and blood culture are already sent.      Ciprofloxacin 400 mg IV q.12h.; initial dose was given in the emergency      department.  IV fluid of normal saline at 150 cc/h.  Tylenol 650 mg p.o.      q.4h. p.r.n.  3.  Coronary artery disease.  Continue with Coreg and Plavix as before.  4.  Hyperlipidemia.  Crestor 10 mg q.d.  Check CMET in a.m.  5.  Hemorrhoids.  Colace 100 mg b.i.d..  Anusol-HC cream apply b.i.d.  6.  Toothache.  Continue with penicillin 500 mg p.o. q.i.d.      MBB/MEDQ  D:  07/16/2004  T:  07/16/2004  Job:  [id number]   cc:   Renaye Rakers, M.D.  [id number], [address]  [place]  [place] [address]  Fax: [phone]

## 2010-10-02 NOTE — Op Note (Signed)
[name], [name] NO.:  [id number]   MEDICAL RECORD NO.:  [id number]          PATIENT TYPE:  OIB   LOCATION:  2899                         FACILITY:  [facility]   PHYSICIAN:  Cherylynn Ridges, M.D.    DATE OF BIRTH:  07-27-1950   DATE OF PROCEDURE:  10/09/2004  DATE OF DISCHARGE:                                 OPERATIVE REPORT   PREOPERATIVE DIAGNOSIS:  Perianal and intra-anal condylomata acuminata.   POSTOPERATIVE DIAGNOSIS:  Perianal and intra-anal condylomata acuminata.   PROCEDURE.:  1.  Anoscopy.  2.  Cauterization, fulguration and excision of perianal warts.   SURGEON:  Cherylynn Ridges, M.D.   ASSISTANT.:  None.   ANESTHESIA:  General endotracheal.   ESTIMATED BLOOD LOSS:  Less than 20 mL.   COMPLICATIONS:  No complications.   CONDITION:  Stable.   FINDINGS:  The patient had multiple external and internal condylomata.  The  internal ones were fulgurized and some were excised using electrocautery.  The external ones were excised using electrocautery.   OPERATION:  The patient was taken to the operating room and placed on the  table initially in the supine position.  After an adequate general  anesthetic was administered, he was placed in lithotomy position and then  prepped and draped in the usual sterile manner.   The warts were easily exposed.  We irrigated with saline solution.  We used  a bullet retractor in the anus where multiple posterior and lateral internal  condyloma were excised using electrocautery at the mucosa level only.  Control of the bleeding was done with electrocautery and also we used pieces  of Gelfoam and thrombin spray to control the bleeding.  Other internal  mucosal condylomata were cauterized.  We did use a jet vacuum evacuator in  order to controlled the smoke from the tip of the Bovie.   The external condylomata were excised using electrocautery with a small  maybe millimeter of skin taken around each the wart.  No  suturing was done.  We irrigated with saline, cautery was used to control bleeding and we placed  a sterile dressing.  Dibucaine ointment was applied to the wound externally  also for pain control.       JOW/MEDQ  D:  10/09/2004  T:  10/09/2004  Job:  [id number]

## 2010-11-16 ENCOUNTER — Encounter (HOSPITAL_COMMUNITY): Payer: MEDICARE | Admitting: Psychiatry

## 2010-12-14 ENCOUNTER — Encounter (HOSPITAL_COMMUNITY): Payer: Self-pay | Admitting: Psychiatry

## 2010-12-23 ENCOUNTER — Encounter (INDEPENDENT_AMBULATORY_CARE_PROVIDER_SITE_OTHER): Payer: Self-pay | Admitting: Psychiatry

## 2010-12-23 DIAGNOSIS — F411 Generalized anxiety disorder: Secondary | ICD-10-CM

## 2011-02-05 LAB — CULTURE, BLOOD (ROUTINE X 2): Culture: NO GROWTH

## 2011-02-05 LAB — POCT I-STAT 3, ART BLOOD GAS (G3+)
Acid-Base Excess: 1
Bicarbonate: 23.1
Operator id: 257081
Operator id: 299431
Patient temperature: 97.1
Patient temperature: 97.3
pH, Arterial: 7.375
pH, Arterial: 7.402
pO2, Arterial: 569 — ABNORMAL HIGH
pO2, Arterial: 92

## 2011-02-05 LAB — I-STAT 8, (EC8 V) (CONVERTED LAB)
Acid-base deficit: 3 — ABNORMAL HIGH
Chloride: 107
HCT: 51
Hemoglobin: 17.3 — ABNORMAL HIGH
Operator id: 265201
Potassium: 3.4 — ABNORMAL LOW
Sodium: 139
TCO2: 25

## 2011-02-05 LAB — CBC
Hemoglobin: 15.6
RBC: 5.19
RBC: 5.3
WBC: 5.3

## 2011-02-05 LAB — POCT I-STAT CREATININE
Creatinine, Ser: 1.1
Operator id: 265201

## 2011-02-05 LAB — POCT CARDIAC MARKERS
CKMB, poc: <1 — ABNORMAL LOW
Myoglobin, poc: 56.8

## 2011-02-05 LAB — CK TOTAL AND CKMB (NOT AT ARMC): Relative Index: 1

## 2011-02-05 LAB — TROPONIN I: Troponin I: 0.01

## 2011-02-05 LAB — D-DIMER, QUANTITATIVE: D-Dimer, Quant: 0.46

## 2011-02-08 LAB — BASIC METABOLIC PANEL
BUN: 13
Calcium: 9.8
Chloride: 103
Creatinine, Ser: 0.96
Creatinine, Ser: 0.97
GFR calc Af Amer: >60
GFR calc non Af Amer: >60
Glucose, Bld: 129 — ABNORMAL HIGH
Potassium: 3.7

## 2011-02-08 LAB — CARDIAC PANEL(CRET KIN+CKTOT+MB+TROPI)
Relative Index: 1.5
Total CK: 120
Troponin I: <0.01
Troponin I: <0.01

## 2011-02-08 LAB — CBC
MCHC: 33.8
MCV: 87.6
Platelets: 233
Platelets: 259
RDW: 13.5
WBC: 3.5 — ABNORMAL LOW

## 2011-02-08 LAB — CULTURE, RESPIRATORY W GRAM STAIN: Culture: NORMAL

## 2011-02-08 LAB — EXPECTORATED SPUTUM ASSESSMENT W GRAM STAIN, RFLX TO RESP C

## 2011-03-29 ENCOUNTER — Encounter (HOSPITAL_COMMUNITY): Payer: Self-pay | Admitting: Psychiatry

## 2011-03-29 ENCOUNTER — Ambulatory Visit (INDEPENDENT_AMBULATORY_CARE_PROVIDER_SITE_OTHER): Payer: Medicare Other | Admitting: Psychiatry

## 2011-03-29 DIAGNOSIS — F419 Anxiety disorder, unspecified: Secondary | ICD-10-CM

## 2011-03-29 DIAGNOSIS — F411 Generalized anxiety disorder: Secondary | ICD-10-CM

## 2011-03-29 MED ORDER — MIRTAZAPINE 15 MG PO TABS: 15.0000 mg | ORAL_TABLET | Freq: Every day | ORAL | Status: DC

## 2011-03-29 NOTE — Progress Notes (Signed)
Patient came for his followup appointment is been compliant with his medications. He is not using any drugs or alcohol. He recently found that his wife has schizophrenia and now she is taking Haldol and also seeing a therapist. Call with the help of therapy and medication he feels that wife is doing very well and marriage is going very well. I reviewed his medication he is taking medication for his blood pressure and cholesterol. Please see Dr. Parke Simmers for his physical health. He likes his Remeron at that time which helps her sleep and anxiety under control.  Mental status examination patient is casually dressed well-groomed appears to be his his stated age he described his mood is anxious and affect was appropriate. He denies any active or passive suicidal thinking or homicidal thinking. His attention and concentration is fair. There were no psychotic symptoms present. Is alert and oriented x3. His insight judgment impulse control is okay.  Assessment anxiety disorder NOS  Plan I will continue his Remeron to 15 mg at bedtime. I explained the risks and benefits of medication in detail. I will see him again in 3 months.

## 2011-06-30 ENCOUNTER — Ambulatory Visit (INDEPENDENT_AMBULATORY_CARE_PROVIDER_SITE_OTHER): Payer: Medicare Other | Admitting: Psychiatry

## 2011-06-30 ENCOUNTER — Encounter (HOSPITAL_COMMUNITY): Payer: Self-pay | Admitting: Psychiatry

## 2011-06-30 DIAGNOSIS — F419 Anxiety disorder, unspecified: Secondary | ICD-10-CM

## 2011-06-30 DIAGNOSIS — F32A Depression, unspecified: Secondary | ICD-10-CM | POA: Insufficient documentation

## 2011-06-30 DIAGNOSIS — F329 Major depressive disorder, single episode, unspecified: Secondary | ICD-10-CM

## 2011-06-30 DIAGNOSIS — F411 Generalized anxiety disorder: Secondary | ICD-10-CM

## 2011-06-30 MED ORDER — MIRTAZAPINE 15 MG PO TABS: 15.0000 mg | ORAL_TABLET | Freq: Every day | ORAL | Status: DC

## 2011-06-30 NOTE — Progress Notes (Signed)
Chief complaint I'm doing good on her medication  History of presenting illness Patient is 61 year old African American retired married man who came for his followup appointment. Patient has history of depression and polysubstance use. He's been stable on Remeron. He is sleeping okay. He reported good marital life. He reported his wife is been very supportive. He is not doing drugs. He denies drinking alcohol. He likes the Remeron which is helping his sleep and depression. Recently he is seen his primary care physician who started him on metformin. He feel decrease in his appetite and happy that he has lost 4 pounds. He denies any agitation anger mood swings. He denies any crying spells. He described his mood is calm and stable. He denies any side effects of medication.  Past psychiatric history Patient has history of depression in 1990. He has one suicidal attempt 9 and before. He had tried in the past Zoloft Paxil Prozac Wellbutrin however he stopped due to side effects. He likes his Remeron.  Alcohol and substance abuse history Patient has significant history of using cocaine marijuana alcohol for 20 years. Since he has been seeing in this office in 2010 and he has stopped using drugs.  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  Psychosocial history Patient was born and raised in [place]. He move to [place] in 1986. Patient has 4 children who lives out of town. He recently married again and reported his family life is going good.  Medicine is examination Patient is casually dressed and fairly groomed. He appears to be in his his stated age. His speech is fast but clear and coherent. His thought process logical linear and goal-directed. He described his mood is anxious and his affect is mood congruent. He denies any  active or passive suicidal thoughts or homicidal thoughts. He denies any auditory or visual hallucination. He's alert and oriented x3. His insight judgment and pulse control is okay.  Assessment Axis I Major depressive disorder, polysubstance dependence in complete remission Axis II deferred Axis III see medical history Axis IV mild Axis V 60-65  Plan I will continue his Remeron 15 mg at bedtime. Patient has been stable on this dose and reported no concern or side effects of medication. I continued to encourage him for healthy diet and regular exercise for better control of his blood sugar and weight. I recommended to call us if he has a question or concern or if he feel worsening of the symptoms. Otherwise I will see him in 3 months.

## 2011-07-28 ENCOUNTER — Ambulatory Visit (HOSPITAL_COMMUNITY): Payer: Self-pay | Admitting: Psychiatry

## 2011-08-24 ENCOUNTER — Ambulatory Visit
Admission: RE | Admit: 2011-08-24 | Discharge: 2011-08-24 | Disposition: A | Discharge status: Home / Self Care | Payer: Medicare Other | Source: Ambulatory Visit | Attending: Orthopedic Surgery | Admitting: Orthopedic Surgery

## 2011-08-24 ENCOUNTER — Other Ambulatory Visit: Payer: Self-pay | Admitting: Orthopedic Surgery

## 2011-08-24 DIAGNOSIS — M5430 Sciatica, unspecified side: Secondary | ICD-10-CM

## 2011-08-25 ENCOUNTER — Ambulatory Visit (INDEPENDENT_AMBULATORY_CARE_PROVIDER_SITE_OTHER): Payer: Self-pay | Admitting: Psychiatry

## 2011-08-25 ENCOUNTER — Encounter (HOSPITAL_COMMUNITY): Payer: Self-pay | Admitting: Psychiatry

## 2011-08-25 DIAGNOSIS — F411 Generalized anxiety disorder: Secondary | ICD-10-CM

## 2011-08-25 DIAGNOSIS — F419 Anxiety disorder, unspecified: Secondary | ICD-10-CM

## 2011-08-25 MED ORDER — MIRTAZAPINE 30 MG PO TABS: 30.0000 mg | ORAL_TABLET | Freq: Every day | ORAL | Status: DC

## 2011-08-25 NOTE — Progress Notes (Signed)
Chief complaint I am more anxious and cannot sleep.    History of presenting illness Patient is 61 year old African American retired married man who came for his followup appointment.  Patient recently endorse increased anxiety and nervousness.  He is concerned about his disability case which is under review .  He is hoping that he will get approved however until then he feels really anxious and nervous.  He has recently seen his physician due to shortness of breath and his medicine has been changed and adjusted.  He complained poor sleep and anxiety and nervousness in the night.  He complained racing thoughts but denies any active or passive suicidal thinking and homicidal thinking.  He is compliant with his Remeron denies any side effects.  He's not drinking or using any illegal substance.  Past psychiatric history Patient has history of depression in 1990. He has one suicidal attempt 9 and before. He had tried in the past Zoloft Paxil Prozac Wellbutrin however he stopped due to side effects. He likes his Remeron.  Alcohol and substance abuse history Patient has significant history of using cocaine marijuana alcohol for 20 years. Since he has been seeing in this office in 2010 and he has stopped using drugs.  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  Psychosocial history Patient was born and raised in [place]. He move to [place] in 1986. Patient has 4 children who lives out of town. He recently married again and reported his family life is going good.  Medicine is examination Patient is casually dressed and fairly groomed. He appears anxious and described his mood is nervous.  His affect is constricted.  He denies any active or passive suicidal thinking and homicidal thinking.  He denies any auditory or visual  hallucination.  His speech is slow coherent.  He appears tired and having shortness of breath .  He uses oxygen for his COPD.  Is alert and oriented x3.  His attention and concentration is fair.  There were no paranoia or psychotic symptoms present.  His insight judgment and impulse control is okay.  Assessment Axis I Major depressive disorder, polysubstance dependence in complete remission Axis II deferred Axis III see medical history Axis IV mild Axis V 60-65  Plan I will increase his Remeron 30 mg at bedtime to target insomnia and residual anxiety.  Reassurance given.  Explained risks and benefits of medication.  I recommended to call us if he started to feel worsening of her symptoms .  I will see him again in 4 weeks.

## 2011-09-14 ENCOUNTER — Ambulatory Visit
Admission: RE | Admit: 2011-09-14 | Discharge: 2011-09-14 | Disposition: A | Discharge status: Home / Self Care | Payer: Medicare Other | Source: Ambulatory Visit | Attending: Orthopedic Surgery | Admitting: Orthopedic Surgery

## 2011-09-14 ENCOUNTER — Other Ambulatory Visit: Payer: Self-pay | Admitting: Orthopedic Surgery

## 2011-09-14 DIAGNOSIS — M25552 Pain in left hip: Secondary | ICD-10-CM

## 2011-09-14 DIAGNOSIS — M25551 Pain in right hip: Secondary | ICD-10-CM

## 2011-09-22 ENCOUNTER — Encounter (HOSPITAL_COMMUNITY): Payer: Self-pay | Admitting: Psychiatry

## 2011-09-22 ENCOUNTER — Ambulatory Visit (INDEPENDENT_AMBULATORY_CARE_PROVIDER_SITE_OTHER): Payer: 59 | Admitting: Psychiatry

## 2011-09-22 DIAGNOSIS — F1921 Other psychoactive substance dependence, in remission: Secondary | ICD-10-CM

## 2011-09-22 DIAGNOSIS — F419 Anxiety disorder, unspecified: Secondary | ICD-10-CM

## 2011-09-22 DIAGNOSIS — F329 Major depressive disorder, single episode, unspecified: Secondary | ICD-10-CM

## 2011-09-22 MED ORDER — MIRTAZAPINE 30 MG PO TABS: 30.0000 mg | ORAL_TABLET | Freq: Every day | ORAL | Status: DC

## 2011-09-22 NOTE — Progress Notes (Signed)
Chief complaint  Medication management and followup.    History of presenting illness Patient is 61 year old African American retired married man who came for his followup appointment.   On his last visit we have increased her Remeron because was very anxious and nervous.  He was also seen by his physician due to shortness of breath and his medicine were adjusted.  But the increase Remeron he's doing much better.  He is sleeping better.  He denies any crying spells or any agitation.  He endorse some decrease in energy and feeling tired.  He was also started pain medication for his bursitis and since then he has noticed decreased energy.  Otherwise his mood has been stable.  He is less anxious and less depressed.  He denies any agitation anger or mood swings.  He denies any drinking or using any illegal substance.  Current psychiatric medication Remeron 30 mg at bedtime.  Past psychiatric history Patient has history of depression in 1990. He has one suicidal attempt 9 and before. He had tried in the past Zoloft Paxil Prozac Wellbutrin however he stopped due to side effects. He likes his Remeron.  Alcohol and substance abuse history Patient has significant history of using cocaine marijuana alcohol for 20 years. Since he has been seeing in this office in 2010 and he has stopped using drugs.  Medical history Patient has history of hypertension, obstructive sleep apnea, BPH, COPD, diabetes, hyperlipidemia, coronary artery disease with status post stent .  his primary care physician isDr Blam.  Psychosocial history Patient was born and raised in [place]. He move to [place] in 1986. Patient has 4 children who lives out of town. He recently married again and reported his family life is going good.  Mental status examination Patient is casually dressed and fairly groomed. He appears calm and cooperative.  He described his mood is good and his affect is mood congruent.  His attention and  concentration is fair.  He denies any active or passive suicidal thinking and homicidal thinking.  He denies any auditory or visual hallucination.  His attention and concentration is good.  He uses oxygen for his COPD.  He's alert and oriented x3.  His insight judgment and pulse control is okay.  Assessment Axis I Major depressive disorder, polysubstance dependence in complete remission Axis II deferred Axis III see medical history Axis IV mild Axis V 60-65  Plan I will continue his current Remeron 30 mg at bedtime.  patient is tolerating his medication without any side effects.  I recommended to contact his family care physician for decreased energy since he has started taking pain. I explained risks and benefits of medication.  I recommended to call us if he started to feel worsening of her symptoms .  I will see him again in 8 weeks.

## 2011-09-27 ENCOUNTER — Ambulatory Visit (HOSPITAL_COMMUNITY): Payer: Self-pay | Admitting: Psychiatry

## 2011-10-12 ENCOUNTER — Other Ambulatory Visit: Payer: Self-pay | Admitting: Cardiology

## 2011-11-03 ENCOUNTER — Other Ambulatory Visit: Payer: Self-pay | Admitting: Cardiology

## 2011-11-22 ENCOUNTER — Ambulatory Visit (INDEPENDENT_AMBULATORY_CARE_PROVIDER_SITE_OTHER): Payer: 59 | Admitting: Psychiatry

## 2011-11-22 ENCOUNTER — Encounter (HOSPITAL_COMMUNITY): Payer: Self-pay | Admitting: Psychiatry

## 2011-11-22 DIAGNOSIS — F419 Anxiety disorder, unspecified: Secondary | ICD-10-CM

## 2011-11-22 DIAGNOSIS — F329 Major depressive disorder, single episode, unspecified: Secondary | ICD-10-CM

## 2011-11-22 MED ORDER — MIRTAZAPINE 30 MG PO TABS: 30.0000 mg | ORAL_TABLET | Freq: Every day | ORAL | Status: DC

## 2011-11-22 NOTE — Progress Notes (Signed)
Chief complaint  Medication management and followup.    History of presenting illness Patient is 61 year old African American retired married man who came for his followup appointment.   He is been compliant with her psychiatric medication.  He denies any recent crying spells agitation anger mood swing.  His depression has been much control with Remeron.  He is worried about his physical health and recently seen orthopedic Dr. for his chronic joint pain.  He has given pain medication and recommended to see again in few weeks.  Patient is still worried about his chronic joint pain .  He has multiple physical problems .  He uses oxygen in carry a small oxygen tank with him.  He admitted decreased energy in past few months due to physical condition and taking multiple medication.  However he denies using any illegal substance or drinking.   Current psychiatric medication Remeron 30 mg at bedtime.  Past psychiatric history Patient has history of depression in 1990. He has one suicidal attempt 9 and before. He had tried in the past Zoloft Paxil Prozac Wellbutrin however he stopped due to side effects. He likes his Remeron.  Alcohol and substance abuse history Patient has significant history of using cocaine marijuana alcohol for 20 years. Since he has been seeing in this office in 2010 and he has stopped using drugs.  Medical history Patient has history of hypertension, obstructive sleep apnea, BPH, COPD, diabetes, hyperlipidemia, coronary artery disease with status post stent and arthritis.. His primary care physician is Dr Delila Spence.  Psychosocial history Patient was born and raised in [place]. He move to [place] in 1986. Patient has 4 children who lives out of town. He recently married again and reported his family life is going good.  Mental status examination Patient is casually dressed and fairly groomed. He appears tired but cooperative.  He uses oxygen .  He described his mood is anxious  and his affect is appropriate.  He denies any active or passive suicidal thoughts or homicidal thoughts.  He denies any auditory or visual hallucination.  His attention and concentration is fair.  There were no flight of idea or loose association. He's alert and oriented x3.  His insight judgment and pulse control is okay.  Assessment Axis I Major depressive disorder, polysubstance dependence in complete remission Axis II deferred Axis III see medical history Axis IV mild Axis V 60-65  Plan I will continue his current Remeron 30 mg at bedtime.  patient is tolerating his medication without any side effects.  I recommended to contact his family care physician for decreased energy since he has started taking pain. I explained risks and benefits of medication.  I recommended to call us if he started to feel worsening of her symptoms .  I will see him again in 12 weeks.

## 2011-12-10 ENCOUNTER — Other Ambulatory Visit: Payer: Self-pay | Admitting: Cardiology

## 2012-02-21 ENCOUNTER — Encounter (HOSPITAL_COMMUNITY): Payer: Self-pay

## 2012-02-21 ENCOUNTER — Ambulatory Visit (HOSPITAL_COMMUNITY): Payer: Self-pay | Admitting: Psychiatry

## 2012-03-06 ENCOUNTER — Other Ambulatory Visit (HOSPITAL_COMMUNITY): Payer: Self-pay | Admitting: Psychiatry

## 2012-03-06 DIAGNOSIS — F419 Anxiety disorder, unspecified: Secondary | ICD-10-CM

## 2012-03-06 MED ORDER — MIRTAZAPINE 30 MG PO TABS: 30.0000 mg | ORAL_TABLET | Freq: Every day | ORAL | Status: DC

## 2012-03-13 ENCOUNTER — Ambulatory Visit (HOSPITAL_COMMUNITY)
Admission: RE | Admit: 2012-03-13 | Discharge: 2012-03-13 | Disposition: A | Discharge status: Home / Self Care | Payer: Medicare Other | Source: Ambulatory Visit | Attending: Family Medicine | Admitting: Family Medicine

## 2012-03-13 DIAGNOSIS — J45909 Unspecified asthma, uncomplicated: Secondary | ICD-10-CM | POA: Insufficient documentation

## 2012-03-13 MED ORDER — ALBUTEROL SULFATE (5 MG/ML) 0.5% IN NEBU
2.5000 mg | INHALATION_SOLUTION | Freq: Once | RESPIRATORY_TRACT | Status: AC
  Administered 2012-03-13: 2.5 mg via RESPIRATORY_TRACT

## 2012-03-14 ENCOUNTER — Ambulatory Visit (INDEPENDENT_AMBULATORY_CARE_PROVIDER_SITE_OTHER): Payer: 59 | Admitting: Psychiatry

## 2012-03-14 ENCOUNTER — Encounter (HOSPITAL_COMMUNITY): Payer: Self-pay | Admitting: Psychiatry

## 2012-03-14 VITALS — BP 152/104 | HR 94 | Wt 198.4 lb

## 2012-03-14 DIAGNOSIS — F1921 Other psychoactive substance dependence, in remission: Secondary | ICD-10-CM

## 2012-03-14 DIAGNOSIS — F329 Major depressive disorder, single episode, unspecified: Secondary | ICD-10-CM

## 2012-03-14 DIAGNOSIS — F419 Anxiety disorder, unspecified: Secondary | ICD-10-CM

## 2012-03-14 MED ORDER — MIRTAZAPINE 30 MG PO TABS: 30.0000 mg | ORAL_TABLET | Freq: Every day | ORAL | Status: DC

## 2012-03-14 NOTE — Progress Notes (Signed)
[facility] [id number] Progress Note  [name] [id number] 61 y.o.  03/14/2012 2:01 PM  Chief Complaint: I'm having a right hip replacement on 21st November.  History of Present Illness: Patient is 61 year old African American married man who came for his followup appointment.  He is compliant with his psychiatric medication.  He is sleeping better however recently he's been more anxious.  He scheduled to have right hip replacement on November 21.  He is hoping everything goal very well.  He still has hip pain and sometime he cannot walk very well due to pain.  He is taking multiple pain medication.  He likes his current psychiatric medication.  He denies any irritability agitation anger mood swing.  He denies any recent crying spells.  He sleeps 6 hours.  He's not drinking or using any illegal substance.  He still takes oxygen and carries a small oxygen tank with him.  He takes Remeron 30 mg at bedtime Suicidal Ideation: No Plan Formed: No Patient has means to carry out plan: No  Homicidal Ideation: No Plan Formed: No Patient has means to carry out plan: No  Review of Systems: Psychiatric: Agitation: No Hallucination: No Depressed Mood: No Insomnia: Yes Hypersomnia: No Altered Concentration: No Feels Worthless: No Grandiose Ideas: No Belief In Special Powers: No New/Increased Substance Abuse: No Compulsions: No  Neurologic: Headache: Yes Seizure: No Paresthesias: No  Past psychiatric history Patient has history of depression since 1990.  He has one suicidal attempt .  In the past he had tried Zoloft Paxil Prozac and Wellbutrin but he stopped due to side effects.  Patient has significant history of using cocaine marijuana and alcohol for 20 years.  He claims to be sober since she's been coming to this office.    Medical history Patient has history of hypertension, sleep apnea, BPH , COPD, diabetes, hyperlipidemia, coronary artery disease status post stent and  arthritis.  He scheduled to have right hip replacement on November 21.  His primary care physician is Dr. Delila Spence.  Social History: Patient was born and raised in [place].  His been living in [place] since 1986.  Patient has 4 children who lives out of town.  He's been married for 2 years.  He has a very supportive wife.  His wife supervises his medication.    Outpatient Encounter Prescriptions as of 03/14/2012  Medication Sig Dispense Refill  . amLODipine (NORVASC) 5 MG tablet       . atenolol (TENORMIN) 25 MG tablet       . ATROVENT HFA 17 MCG/ACT inhaler       . clopidogrel (PLAVIX) 75 MG tablet       . HYDROcodone-acetaminophen (LORCET) 10-650 MG per tablet       . metFORMIN (GLUCOPHAGE) 500 MG tablet       . mirtazapine (REMERON) 30 MG tablet Take 1 tablet (30 mg total) by mouth at bedtime.  30 tablet  2  . pravastatin (PRAVACHOL) 40 MG tablet       . DISCONTD: mirtazapine (REMERON) 30 MG tablet Take 1 tablet (30 mg total) by mouth at bedtime.  30 tablet  0  . naproxen (NAPROSYN) 500 MG tablet       . DISCONTD: etodolac (LODINE) 400 MG tablet         Past Psychiatric History/Hospitalization(s): Anxiety: Yes Bipolar Disorder: No Depression: No Mania: No Psychosis: No Schizophrenia: No Personality Disorder: No Hospitalization for psychiatric illness: Yes History of Electroconvulsive Shock Therapy: No  Prior Suicide Attempts: Yes  Physical Exam: Constitutional:  BP 152/104  Pulse 94  Wt 198 lb 6.4 oz (89.994 kg)  General Appearance: alert, oriented, no acute distress, he walk with pain in his right hip.  He is casually dressed and fairly groomed.  He uses oxygen  Musculoskeletal: Strength & Muscle Tone: within normal limits Gait & Station: unsteady due to pain Patient leans: Front  Psychiatric: Speech (describe rate, volume, coherence, spontaneity, and abnormalities if any): Soft clear and coherent with normal tone volume  Thought Process (describe rate, content,  abstract reasoning, and computation): Logical and goal-directed  Associations: Coherent, Relevant and Intact  Thoughts: normal  Mental Status: Orientation: oriented to person, place, time/date and situation Mood & Affect: anxiety Attention Span & Concentration: Fair  Medical Decision Making (Choose Three): Established Problem, Stable/Improving (1), Review of Psycho-Social Stressors (1), Review or order clinical lab tests (1), New Problem, with no additional work-up planned (3), Review of Last Therapy Session (1) and Review of Medication Regimen & Side Effects (2)  Assessment: Axis I: Maj. depressive disorder, polysubstance dependence in complete remission  Axis II: Deferred  Axis III: See medical history  Axis IV: Mild to moderate  Axis V: 60-65   Plan: I review his medical history and current medication.  Patient has been more anxious than usual however he is scheduled to have hip surgery in 3 weeks.  Reassurance given.  Patient does not feel his medication needs to be increased.  He understands at is situational.  At this time patient does not have any side effects of Remeron.  His weight has been stable.  I recommend to call us if he has any question or concern about the medication or if he feel worsening of the symptom.  Patient need to list of medication which is provided.  I will see him again in 3 months.  More than 50% of the time spent in counseling, psychoeducation and coordination of care.  Caliber Landess T., MD 03/14/2012

## 2012-03-21 ENCOUNTER — Other Ambulatory Visit: Payer: Self-pay | Admitting: Orthopedic Surgery

## 2012-03-23 ENCOUNTER — Encounter (HOSPITAL_COMMUNITY)
Admission: RE | Admit: 2012-03-23 | Discharge: 2012-03-23 | Payer: Medicare Other | Source: Ambulatory Visit | Attending: Orthopedic Surgery | Admitting: Orthopedic Surgery

## 2012-03-23 NOTE — Pre-Procedure Instructions (Signed)
[address] FAYSAL FENOGLIO  03/23/2012   Your procedure is scheduled on:  April 06, 2012 Thursday at 0730 AM  Report to [facility] at 0530 AM.  Call this number if you have problems the morning of surgery: [phone]   Remember:   Do not eat food or drink:After Midnight.Wednesday      Take these medicines the morning of surgery with A SIP OF WATER: Amlodipine[Norvasc], Atenolol[ Tenormin], and Hydrocodone-acetaminophen[ Lorcet]  If needed. Bring  Inhaler with you Atrovent   Do not wear jewelry.  Do not wear lotions, powders, or perfumes. You may wear deodorant.             Men may shave face and neck.  Do not bring valuables to the hospital.  Contacts, dentures or bridgework may not be worn into surgery.  Leave suitcase in the car. After surgery it may be brought to your room.  For patients admitted to the hospital, checkout time is 11:00 AM the day of discharge.   Patients discharged the day of surgery will not be allowed to drive home.    Special Instructions: Incentive Spirometry - Practice and bring it with you on the day of surgery. Shower using CHG 2 nights before surgery and the night before surgery.  If you shower the day of surgery use CHG.  Use special wash - you have one bottle of CHG for all showers.  You should use approximately 1/3 of the bottle for each shower.   Please read over the following fact sheets that you were given: Pain Booklet, Coughing and Deep Breathing, Blood Transfusion Information, Total Joint Packet, MRSA Information and Surgical Site Infection Prevention

## 2012-03-30 ENCOUNTER — Ambulatory Visit (HOSPITAL_COMMUNITY)
Admission: RE | Admit: 2012-03-30 | Discharge: 2012-03-30 | Disposition: A | Discharge status: Home / Self Care | Payer: Medicare Other | Source: Ambulatory Visit | Attending: Anesthesiology | Admitting: Anesthesiology

## 2012-03-30 ENCOUNTER — Encounter (HOSPITAL_COMMUNITY)
Admission: RE | Admit: 2012-03-30 | Discharge: 2012-03-30 | Disposition: A | Discharge status: Home / Self Care | Payer: Medicare Other | Source: Ambulatory Visit | Attending: Orthopedic Surgery | Admitting: Orthopedic Surgery

## 2012-03-30 ENCOUNTER — Encounter (HOSPITAL_COMMUNITY): Payer: Self-pay

## 2012-03-30 ENCOUNTER — Encounter (HOSPITAL_COMMUNITY): Payer: Self-pay | Admitting: Pharmacy Technician

## 2012-03-30 DIAGNOSIS — Z01818 Encounter for other preprocedural examination: Secondary | ICD-10-CM | POA: Insufficient documentation

## 2012-03-30 HISTORY — DX: Acute myocardial infarction, unspecified: I21.9

## 2012-03-30 HISTORY — DX: Unspecified osteoarthritis, unspecified site: M19.90

## 2012-03-30 HISTORY — DX: Shortness of breath: R06.02

## 2012-03-30 HISTORY — DX: Hyperlipidemia, unspecified: E78.5

## 2012-03-30 HISTORY — DX: Anxiety disorder, unspecified: F41.9

## 2012-03-30 HISTORY — DX: Type 2 diabetes mellitus without complications: E11.9

## 2012-03-30 LAB — SURGICAL PCR SCREEN: MRSA, PCR: NEGATIVE

## 2012-03-30 LAB — PROTIME-INR
INR: 1.04 (ref 0.00–1.49)
Prothrombin Time: 13.5 seconds (ref 11.6–15.2)

## 2012-03-30 LAB — CBC
Hemoglobin: 16.4 g/dL (ref 13.0–17.0)
MCH: 30.1 pg (ref 26.0–34.0)
MCHC: 36.5 g/dL — ABNORMAL HIGH (ref 30.0–36.0)
Platelets: 272 10*3/uL (ref 150–400)
RDW: 13.3 % (ref 11.5–15.5)

## 2012-03-30 LAB — BASIC METABOLIC PANEL
BUN: 11 mg/dL (ref 6–23)
Calcium: 10.6 mg/dL — ABNORMAL HIGH (ref 8.4–10.5)
Creatinine, Ser: 0.92 mg/dL (ref 0.50–1.35)
GFR calc Af Amer: >90 mL/min (ref >90)
GFR calc non Af Amer: 89 mL/min — ABNORMAL LOW (ref >90)
Glucose, Bld: 116 mg/dL — ABNORMAL HIGH (ref 70–99)

## 2012-03-30 LAB — TYPE AND SCREEN: Antibody Screen: NEGATIVE

## 2012-03-30 NOTE — Progress Notes (Signed)
Called office for surgical orders, however they are closed for the day.

## 2012-03-30 NOTE — Pre-Procedure Instructions (Signed)
[address] KALED ALLENDE  03/30/2012   Your procedure is scheduled on:  April 06, 2012 Thursday at 0730 AM  Report to [facility] at 0530 AM.  Call this number if you have problems the morning of surgery: [phone]   Remember:   Do not eat food or drink:After Midnight.Wednesday      Take these medicines the morning of surgery with A SIP OF WATER:  Hydrocodone[Lorcet if needed. Use Spiriva. Bring Nitro to surgery.    Do not wear jewelry.  Do not wear lotions, powders, or perfumes. You may wear deodorant.             Men may shave face and neck.  Do not bring valuables to the hospital.  Contacts, dentures or bridgework may not be worn into surgery.  Leave suitcase in the car. After surgery it may be brought to your room.  For patients admitted to the hospital, checkout time is 11:00 AM the day of discharge.   Patients discharged the day of surgery will not be allowed to drive home.    Special Instructions: Incentive Spirometry - Practice and bring it with you on the day of surgery. Shower using CHG 2 nights before surgery and the night before surgery.  If you shower the day of surgery use CHG.  Use special wash - you have one bottle of CHG for all showers.  You should use approximately 1/3 of the bottle for each shower.   Please read over the following fact sheets that you were given: Pain Booklet, Coughing and Deep Breathing, Blood Transfusion Information, Total Joint Packet, MRSA Information and Surgical Site Infection Prevention

## 2012-03-30 NOTE — Progress Notes (Signed)
Faxed request for last OV, stress test if available to Dr. York Spaniel office. Pt has ECHO (11/2008) and cardiac cath (06/2008) in [facility], brought EKG from 03/16/12 to PAT appt - copy in chart.   Will leave for anesthesia review of cardiac records.

## 2012-03-31 NOTE — Consult Note (Addendum)
Anesthesia Chart Review:  Patient is a 60 year old male scheduled for right total hip arthroplasty by Dr. Montez Morita on 04/06/2012. Orders were pending at the time of his PAT.  History includes smoking, COPD, anxiety, arthritis, hypertension, obstructive sleep apnea, diabetes mellitus type 2, hyperlipidemia, CAD/MI s/p OM stent '00, OM1 (DES) and CX (BMS) '03,  And LAD (DES) 06/27/08.  His Cardiologist is Dr. Donnie Aho.  He was recently seen on 03/16/12, but records are still pending.  Echo on 12/09/08 showed: Left ventricle: The cavity size was mildly dilated. Wall thickness was increased in a pattern of moderate LVH. Systolic function was mildly to moderately reduced. The estimated ejection fraction was in the range of 40% to 45%. Severe hypokinesis of the inferolateral myocardium. Doppler parameters are consistent with abnormal left ventricular relaxation (grade 1 diastolic dysfunction).  Cardiac cath on 06/26/08 showed: 1. Significant coronary artery disease with a severe ostial stenosis involving the diagonal branch, diffuse in-stent restenosis to left anterior descending coronary artery, uncertain of significance, widely patent stents in the first marginal and continuation branch and moderate stenosis involving the right coronary artery.  2. Abnormal left ventricular function with apical hypokinesis and distal inferior hypokinesis.  He subsequently underwent a drug-eluting stent for in-stent restenosis of his LAD on 06/27/2008.  CXR report from 03/30/12 showed cardiomediastinal silhouette is stable. No acute infiltrate or pleural effusion. No pulmonary edema. Atherosclerotic calcifications of thoracic aorta again noted. Stable left coronary stent. Bony thorax is unremarkable.   Labs from 03/30/12 noted.  I'll review Cardiology records once available.  Shonna Chock, PA-C 03/31/12 1610  Addendum: 04/04/12 1045 Records from Dr. Donnie Aho reviewed.  Patient's visit from 03/16/12 was a preoperative  evaluation, and it was ultimately felt that patient could proceed with planned hip surgery. His surgical risk was felt to be average. Patient was to discontinue his Plavix 5-7 days prior surgery but remain on other cardiac medications. Dr. Donnie Aho will be available in the perioperative period if needed otherwise 1 year followup was recommended.  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

## 2012-04-03 NOTE — Progress Notes (Signed)
Left message with Olegario Messier at Dr. York Spaniel office and requested echo, EKG, stress test, and last OV

## 2012-04-06 ENCOUNTER — Encounter (HOSPITAL_COMMUNITY): Payer: Self-pay | Admitting: Vascular Surgery

## 2012-04-06 ENCOUNTER — Inpatient Hospital Stay (HOSPITAL_COMMUNITY): Payer: Medicare Other

## 2012-04-06 ENCOUNTER — Encounter (HOSPITAL_COMMUNITY): Payer: Self-pay | Admitting: *Deleted

## 2012-04-06 ENCOUNTER — Inpatient Hospital Stay (HOSPITAL_COMMUNITY): Payer: Medicare Other | Admitting: Vascular Surgery

## 2012-04-06 ENCOUNTER — Inpatient Hospital Stay (HOSPITAL_COMMUNITY)
Admission: RE | Admit: 2012-04-06 | Discharge: 2012-04-10 | DRG: 470 | Disposition: A | Discharge status: Home Health | Payer: Medicare Other | Source: Ambulatory Visit | Attending: Orthopedic Surgery | Admitting: Orthopedic Surgery

## 2012-04-06 ENCOUNTER — Encounter (HOSPITAL_COMMUNITY)
Admission: RE | Disposition: A | Discharge status: Home Health | Payer: Self-pay | Source: Ambulatory Visit | Attending: Orthopedic Surgery

## 2012-04-06 DIAGNOSIS — E785 Hyperlipidemia, unspecified: Secondary | ICD-10-CM | POA: Diagnosis present

## 2012-04-06 DIAGNOSIS — E119 Type 2 diabetes mellitus without complications: Secondary | ICD-10-CM | POA: Diagnosis present

## 2012-04-06 DIAGNOSIS — Z7982 Long term (current) use of aspirin: Secondary | ICD-10-CM

## 2012-04-06 DIAGNOSIS — M1610 Unilateral primary osteoarthritis, unspecified hip: Principal | ICD-10-CM | POA: Diagnosis present

## 2012-04-06 DIAGNOSIS — G4733 Obstructive sleep apnea (adult) (pediatric): Secondary | ICD-10-CM | POA: Diagnosis present

## 2012-04-06 DIAGNOSIS — M169 Osteoarthritis of hip, unspecified: Principal | ICD-10-CM | POA: Diagnosis present

## 2012-04-06 DIAGNOSIS — Z9861 Coronary angioplasty status: Secondary | ICD-10-CM

## 2012-04-06 DIAGNOSIS — I2510 Atherosclerotic heart disease of native coronary artery without angina pectoris: Secondary | ICD-10-CM | POA: Diagnosis present

## 2012-04-06 DIAGNOSIS — M1990 Unspecified osteoarthritis, unspecified site: Secondary | ICD-10-CM | POA: Diagnosis present

## 2012-04-06 DIAGNOSIS — Z79899 Other long term (current) drug therapy: Secondary | ICD-10-CM

## 2012-04-06 DIAGNOSIS — J449 Chronic obstructive pulmonary disease, unspecified: Secondary | ICD-10-CM | POA: Diagnosis present

## 2012-04-06 DIAGNOSIS — I252 Old myocardial infarction: Secondary | ICD-10-CM

## 2012-04-06 DIAGNOSIS — F411 Generalized anxiety disorder: Secondary | ICD-10-CM | POA: Diagnosis present

## 2012-04-06 DIAGNOSIS — I10 Essential (primary) hypertension: Secondary | ICD-10-CM | POA: Diagnosis present

## 2012-04-06 DIAGNOSIS — J4489 Other specified chronic obstructive pulmonary disease: Secondary | ICD-10-CM | POA: Diagnosis present

## 2012-04-06 DIAGNOSIS — G8918 Other acute postprocedural pain: Secondary | ICD-10-CM

## 2012-04-06 HISTORY — PX: TOTAL HIP ARTHROPLASTY: SHX124

## 2012-04-06 LAB — GLUCOSE, CAPILLARY
Glucose-Capillary: 124 mg/dL — ABNORMAL HIGH (ref 70–99)
Glucose-Capillary: 103 mg/dL — ABNORMAL HIGH (ref 70–99)

## 2012-04-06 SURGERY — ARTHROPLASTY, HIP, TOTAL,POSTERIOR APPROACH
Anesthesia: General | Site: Hip | Laterality: Right | Wound class: Clean

## 2012-04-06 MED ORDER — ONDANSETRON HCL 4 MG/2ML IJ SOLN: 4.0000 mg | Freq: Four times a day (QID) | INTRAMUSCULAR | Status: DC | PRN

## 2012-04-06 MED ORDER — ACETAMINOPHEN 325 MG PO TABS
650.0000 mg | ORAL_TABLET | Freq: Four times a day (QID) | ORAL | Status: DC | PRN
  Administered 2012-04-08 – 2012-04-09 (×2): 650 mg via ORAL
  Filled 2012-04-06 (×3): qty 2

## 2012-04-06 MED ORDER — ONDANSETRON HCL 4 MG PO TABS: 4.0000 mg | ORAL_TABLET | Freq: Four times a day (QID) | ORAL | Status: DC | PRN

## 2012-04-06 MED ORDER — NEOSTIGMINE METHYLSULFATE 1 MG/ML IJ SOLN
INTRAMUSCULAR | Status: DC | PRN
  Administered 2012-04-06: 2 mg via INTRAVENOUS

## 2012-04-06 MED ORDER — NALOXONE HCL 0.4 MG/ML IJ SOLN: 0.4000 mg | INTRAMUSCULAR | Status: DC | PRN

## 2012-04-06 MED ORDER — METFORMIN HCL 500 MG PO TABS
500.0000 mg | ORAL_TABLET | Freq: Two times a day (BID) | ORAL | Status: DC
  Administered 2012-04-06 – 2012-04-10 (×8): 500 mg via ORAL
  Filled 2012-04-06 (×10): qty 1

## 2012-04-06 MED ORDER — ROCURONIUM BROMIDE 100 MG/10ML IV SOLN
INTRAVENOUS | Status: DC | PRN
  Administered 2012-04-06: 50 mg via INTRAVENOUS

## 2012-04-06 MED ORDER — WARFARIN VIDEO: Freq: Once | Status: DC

## 2012-04-06 MED ORDER — AMLODIPINE BESYLATE 5 MG PO TABS
5.0000 mg | ORAL_TABLET | Freq: Every day | ORAL | Status: DC
  Administered 2012-04-07 – 2012-04-09 (×3): 5 mg via ORAL
  Filled 2012-04-06 (×5): qty 1

## 2012-04-06 MED ORDER — MENTHOL 3 MG MT LOZG: 1.0000 | LOZENGE | OROMUCOSAL | Status: DC | PRN

## 2012-04-06 MED ORDER — OXYCODONE HCL 5 MG PO TABS: 5.0000 mg | ORAL_TABLET | Freq: Once | ORAL | Status: DC | PRN

## 2012-04-06 MED ORDER — ALUM & MAG HYDROXIDE-SIMETH 200-200-20 MG/5ML PO SUSP: 30.0000 mL | ORAL | Status: DC | PRN

## 2012-04-06 MED ORDER — DOCUSATE SODIUM 100 MG PO CAPS
100.0000 mg | ORAL_CAPSULE | Freq: Two times a day (BID) | ORAL | Status: DC
  Administered 2012-04-06 – 2012-04-10 (×9): 100 mg via ORAL
  Filled 2012-04-06 (×10): qty 1

## 2012-04-06 MED ORDER — NAPHAZOLINE-PHENIRAMINE 0.025-0.3 % OP SOLN
1.0000 [drp] | Freq: Four times a day (QID) | OPHTHALMIC | Status: DC | PRN
  Filled 2012-04-06: qty 15

## 2012-04-06 MED ORDER — PHENYLEPHRINE HCL 10 MG/ML IJ SOLN
INTRAMUSCULAR | Status: DC | PRN
  Administered 2012-04-06: 120 ug via INTRAVENOUS
  Administered 2012-04-06 (×3): 80 ug via INTRAVENOUS

## 2012-04-06 MED ORDER — HYDROMORPHONE 0.3 MG/ML IV SOLN
INTRAVENOUS | Status: DC
  Administered 2012-04-06: 1.19 mg via INTRAVENOUS
  Administered 2012-04-06: 0.599 mg via INTRAVENOUS
  Administered 2012-04-07: 0.999 mg via INTRAVENOUS
  Administered 2012-04-07: 0.799 mg via INTRAVENOUS
  Administered 2012-04-07: 10:00:00 via INTRAVENOUS
  Administered 2012-04-07: 1.19 mg via INTRAVENOUS
  Administered 2012-04-08: 2.79 mg via INTRAVENOUS
  Administered 2012-04-08: 0.799 mg via INTRAVENOUS
  Administered 2012-04-08: 1.19 mg via INTRAVENOUS
  Administered 2012-04-08: 1.99 mg via INTRAVENOUS
  Administered 2012-04-09: 2.19 mg via INTRAVENOUS
  Administered 2012-04-09: 0.599 mg via INTRAVENOUS
  Administered 2012-04-09: 0.799 mg via INTRAVENOUS
  Administered 2012-04-09: 0.8 mg via INTRAVENOUS
  Filled 2012-04-06 (×2): qty 25

## 2012-04-06 MED ORDER — SODIUM CHLORIDE 0.9 % IJ SOLN: 9.0000 mL | INTRAMUSCULAR | Status: DC | PRN

## 2012-04-06 MED ORDER — EPHEDRINE SULFATE 50 MG/ML IJ SOLN
INTRAMUSCULAR | Status: DC | PRN
  Administered 2012-04-06 (×3): 10 mg via INTRAVENOUS

## 2012-04-06 MED ORDER — NAPHAZOLINE-GLYCERIN 0.012-0.2 % OP SOLN
1.0000 [drp] | OPHTHALMIC | Status: DC | PRN
  Filled 2012-04-06: qty 15

## 2012-04-06 MED ORDER — METOCLOPRAMIDE HCL 5 MG/ML IJ SOLN: 5.0000 mg | Freq: Three times a day (TID) | INTRAMUSCULAR | Status: DC | PRN

## 2012-04-06 MED ORDER — PROPOFOL 10 MG/ML IV BOLUS
INTRAVENOUS | Status: DC | PRN
  Administered 2012-04-06 (×2): 200 mg via INTRAVENOUS

## 2012-04-06 MED ORDER — ONDANSETRON HCL 4 MG/2ML IJ SOLN
INTRAMUSCULAR | Status: DC | PRN
  Administered 2012-04-06: 4 mg via INTRAVENOUS

## 2012-04-06 MED ORDER — WARFARIN SODIUM 7.5 MG PO TABS
7.5000 mg | ORAL_TABLET | Freq: Once | ORAL | Status: AC
  Administered 2012-04-06: 7.5 mg via ORAL
  Filled 2012-04-06: qty 1

## 2012-04-06 MED ORDER — HYDROMORPHONE HCL PF 1 MG/ML IJ SOLN
0.2500 mg | INTRAMUSCULAR | Status: DC | PRN
  Administered 2012-04-06: 0.25 mg via INTRAVENOUS
  Administered 2012-04-06: 0.5 mg via INTRAVENOUS
  Administered 2012-04-06: 0.25 mg via INTRAVENOUS
  Administered 2012-04-06: 0.5 mg via INTRAVENOUS

## 2012-04-06 MED ORDER — WARFARIN - PHARMACIST DOSING INPATIENT
Freq: Every day | Status: DC
  Administered 2012-04-07: 18:00:00

## 2012-04-06 MED ORDER — CEFAZOLIN SODIUM-DEXTROSE 2-3 GM-% IV SOLR
INTRAVENOUS | Status: AC
  Filled 2012-04-06: qty 50

## 2012-04-06 MED ORDER — METHOCARBAMOL 100 MG/ML IJ SOLN
500.0000 mg | Freq: Four times a day (QID) | INTRAVENOUS | Status: DC | PRN
  Administered 2012-04-06: 500 mg via INTRAVENOUS
  Filled 2012-04-06: qty 5

## 2012-04-06 MED ORDER — MIRTAZAPINE 15 MG PO TABS
15.0000 mg | ORAL_TABLET | Freq: Every day | ORAL | Status: DC
  Administered 2012-04-06 – 2012-04-09 (×4): 15 mg via ORAL
  Filled 2012-04-06 (×5): qty 1

## 2012-04-06 MED ORDER — DIPHENHYDRAMINE HCL 12.5 MG/5ML PO ELIX: 12.5000 mg | ORAL_SOLUTION | Freq: Four times a day (QID) | ORAL | Status: DC | PRN

## 2012-04-06 MED ORDER — OXYCODONE HCL 5 MG PO TABS
5.0000 mg | ORAL_TABLET | ORAL | Status: DC | PRN
  Administered 2012-04-08 – 2012-04-10 (×11): 10 mg via ORAL
  Filled 2012-04-06 (×11): qty 2

## 2012-04-06 MED ORDER — HYDROMORPHONE HCL PF 1 MG/ML IJ SOLN
INTRAMUSCULAR | Status: AC
  Filled 2012-04-06: qty 1

## 2012-04-06 MED ORDER — NITROGLYCERIN 0.4 MG SL SUBL: 0.4000 mg | SUBLINGUAL_TABLET | SUBLINGUAL | Status: DC | PRN

## 2012-04-06 MED ORDER — HYDROMORPHONE 0.3 MG/ML IV SOLN
INTRAVENOUS | Status: AC
  Administered 2012-04-06: 11:00:00
  Filled 2012-04-06: qty 25

## 2012-04-06 MED ORDER — PHENYLEPHRINE HCL 10 MG/ML IJ SOLN
20.0000 mg | INTRAVENOUS | Status: DC | PRN
  Administered 2012-04-06: 20 ug/min via INTRAVENOUS

## 2012-04-06 MED ORDER — COUMADIN BOOK
Freq: Once | Status: DC
  Filled 2012-04-06: qty 1

## 2012-04-06 MED ORDER — DIPHENHYDRAMINE HCL 50 MG/ML IJ SOLN
12.5000 mg | Freq: Four times a day (QID) | INTRAMUSCULAR | Status: DC | PRN
  Administered 2012-04-08: 12.5 mg via INTRAVENOUS
  Filled 2012-04-06: qty 1

## 2012-04-06 MED ORDER — METOCLOPRAMIDE HCL 5 MG/ML IJ SOLN: 10.0000 mg | Freq: Once | INTRAMUSCULAR | Status: DC | PRN

## 2012-04-06 MED ORDER — CEFAZOLIN SODIUM 1-5 GM-% IV SOLN
1.0000 g | Freq: Three times a day (TID) | INTRAVENOUS | Status: AC
  Administered 2012-04-06 – 2012-04-07 (×3): 1 g via INTRAVENOUS
  Filled 2012-04-06 (×4): qty 50

## 2012-04-06 MED ORDER — LACTATED RINGERS IV SOLN
INTRAVENOUS | Status: DC | PRN
  Administered 2012-04-06 (×2): via INTRAVENOUS

## 2012-04-06 MED ORDER — FENTANYL CITRATE 0.05 MG/ML IJ SOLN
INTRAMUSCULAR | Status: DC | PRN
  Administered 2012-04-06: 50 ug via INTRAVENOUS
  Administered 2012-04-06 (×2): 100 ug via INTRAVENOUS

## 2012-04-06 MED ORDER — SODIUM CHLORIDE 0.9 % IR SOLN
Status: DC | PRN
  Administered 2012-04-06 (×2): 1000 mL

## 2012-04-06 MED ORDER — ACETAMINOPHEN 10 MG/ML IV SOLN
1000.0000 mg | Freq: Four times a day (QID) | INTRAVENOUS | Status: AC
  Administered 2012-04-06 – 2012-04-07 (×4): 1000 mg via INTRAVENOUS
  Filled 2012-04-06 (×4): qty 100

## 2012-04-06 MED ORDER — ATENOLOL 25 MG PO TABS
25.0000 mg | ORAL_TABLET | ORAL | Status: AC
  Administered 2012-04-06: 25 mg via ORAL
  Filled 2012-04-06: qty 1

## 2012-04-06 MED ORDER — ACETAMINOPHEN 650 MG RE SUPP: 650.0000 mg | Freq: Four times a day (QID) | RECTAL | Status: DC | PRN

## 2012-04-06 MED ORDER — FERROUS SULFATE 325 (65 FE) MG PO TABS
325.0000 mg | ORAL_TABLET | Freq: Three times a day (TID) | ORAL | Status: DC
  Administered 2012-04-06 – 2012-04-10 (×12): 325 mg via ORAL
  Filled 2012-04-06 (×15): qty 1

## 2012-04-06 MED ORDER — COQ10 100 MG PO CAPS: 1.0000 | ORAL_CAPSULE | Freq: Every day | ORAL | Status: DC

## 2012-04-06 MED ORDER — METHOCARBAMOL 500 MG PO TABS
500.0000 mg | ORAL_TABLET | Freq: Four times a day (QID) | ORAL | Status: DC | PRN
  Administered 2012-04-06 – 2012-04-10 (×8): 500 mg via ORAL
  Filled 2012-04-06 (×9): qty 1

## 2012-04-06 MED ORDER — GLYCOPYRROLATE 0.2 MG/ML IJ SOLN
INTRAMUSCULAR | Status: DC | PRN
  Administered 2012-04-06: 0.4 mg via INTRAVENOUS

## 2012-04-06 MED ORDER — SIMVASTATIN 5 MG PO TABS
5.0000 mg | ORAL_TABLET | Freq: Every day | ORAL | Status: DC
  Administered 2012-04-06 – 2012-04-09 (×4): 5 mg via ORAL
  Filled 2012-04-06 (×5): qty 1

## 2012-04-06 MED ORDER — LIDOCAINE HCL (CARDIAC) 20 MG/ML IV SOLN
INTRAVENOUS | Status: DC | PRN
  Administered 2012-04-06: 100 mg via INTRAVENOUS

## 2012-04-06 MED ORDER — ATENOLOL 25 MG PO TABS
25.0000 mg | ORAL_TABLET | Freq: Every day | ORAL | Status: DC
  Administered 2012-04-07 – 2012-04-09 (×3): 25 mg via ORAL
  Filled 2012-04-06 (×5): qty 1

## 2012-04-06 MED ORDER — METOCLOPRAMIDE HCL 10 MG PO TABS: 5.0000 mg | ORAL_TABLET | Freq: Three times a day (TID) | ORAL | Status: DC | PRN

## 2012-04-06 MED ORDER — OXYCODONE HCL 5 MG/5ML PO SOLN: 5.0000 mg | Freq: Once | ORAL | Status: DC | PRN

## 2012-04-06 MED ORDER — CEFAZOLIN SODIUM-DEXTROSE 2-3 GM-% IV SOLR
2.0000 g | Freq: Once | INTRAVENOUS | Status: AC
  Administered 2012-04-06: 2 g via INTRAVENOUS

## 2012-04-06 MED ORDER — PHENOL 1.4 % MT LIQD: 1.0000 | OROMUCOSAL | Status: DC | PRN

## 2012-04-06 MED ORDER — TIOTROPIUM BROMIDE MONOHYDRATE 18 MCG IN CAPS
18.0000 ug | ORAL_CAPSULE | Freq: Every morning | RESPIRATORY_TRACT | Status: DC
  Administered 2012-04-07 – 2012-04-10 (×4): 18 ug via RESPIRATORY_TRACT
  Filled 2012-04-06: qty 5

## 2012-04-06 MED ORDER — MIDAZOLAM HCL 5 MG/5ML IJ SOLN
INTRAMUSCULAR | Status: DC | PRN
  Administered 2012-04-06: 2 mg via INTRAVENOUS

## 2012-04-06 SURGICAL SUPPLY — 54 items
BIT DRILL RINGLOC 3.2MMX20 (BIT) ×1 IMPLANT
BIT DRILL RINGLOC 3.2X20 (BIT) ×1
BLADE SAW SAG 73X25 THK (BLADE) ×1
BLADE SAW SGTL 73X25 THK (BLADE) ×1 IMPLANT
BRUSH FEMORAL CANAL (MISCELLANEOUS) IMPLANT
CLOTH BEACON ORANGE TIMEOUT ST (SAFETY) ×2 IMPLANT
COVER SURGICAL LIGHT HANDLE (MISCELLANEOUS) ×2 IMPLANT
DRAPE ORTHO SPLIT 77X108 STRL (DRAPES) ×2
DRAPE SURG ORHT 6 SPLT 77X108 (DRAPES) ×2 IMPLANT
DRAPE U-SHAPE 47X51 STRL (DRAPES) ×2 IMPLANT
DRILL BIT RINGLOC 3.2MMX20 (BIT) ×1
DRSG MEPILEX BORDER 4X12 (GAUZE/BANDAGES/DRESSINGS) ×2 IMPLANT
DRSG MEPILEX BORDER 4X8 (GAUZE/BANDAGES/DRESSINGS) IMPLANT
DURAPREP 26ML APPLICATOR (WOUND CARE) ×2 IMPLANT
ELECT BLADE 4.0 EZ CLEAN MEGAD (MISCELLANEOUS) ×2
ELECT CAUTERY BLADE 6.4 (BLADE) ×2 IMPLANT
ELECT REM PT RETURN 9FT ADLT (ELECTROSURGICAL) ×2
ELECTRODE BLDE 4.0 EZ CLN MEGD (MISCELLANEOUS) ×1 IMPLANT
ELECTRODE REM PT RTRN 9FT ADLT (ELECTROSURGICAL) ×1 IMPLANT
EVACUATOR 3/16  PVC DRAIN (DRAIN)
EVACUATOR 3/16 PVC DRAIN (DRAIN) IMPLANT
FACESHIELD LNG OPTICON STERILE (SAFETY) ×6 IMPLANT
GLOVE BIOGEL PI IND STRL 7.5 (GLOVE) ×1 IMPLANT
GLOVE BIOGEL PI IND STRL 8 (GLOVE) ×1 IMPLANT
GLOVE BIOGEL PI INDICATOR 7.5 (GLOVE) ×1
GLOVE BIOGEL PI INDICATOR 8 (GLOVE) ×1
GLOVE SS PI 9.0 STRL (GLOVE) ×4 IMPLANT
GLOVE SURG SS PI 6.5 STRL IVOR (GLOVE) ×2 IMPLANT
GLOVE SURG SS PI 7.5 STRL IVOR (GLOVE) ×4 IMPLANT
GOWN PREVENTION PLUS XLARGE (GOWN DISPOSABLE) ×2 IMPLANT
GOWN SRG XL XLNG 56XLVL 4 (GOWN DISPOSABLE) ×1 IMPLANT
GOWN STRL NON-REIN LRG LVL3 (GOWN DISPOSABLE) ×2 IMPLANT
GOWN STRL NON-REIN XL XLG LVL4 (GOWN DISPOSABLE) ×1
GOWN STRL REIN 2XL LVL4 (GOWN DISPOSABLE) ×2 IMPLANT
HANDPIECE INTERPULSE COAX TIP (DISPOSABLE)
KIT BASIN OR (CUSTOM PROCEDURE TRAY) ×2 IMPLANT
KIT ROOM TURNOVER OR (KITS) ×2 IMPLANT
MANIFOLD NEPTUNE II (INSTRUMENTS) ×2 IMPLANT
NS IRRIG 1000ML POUR BTL (IV SOLUTION) ×2 IMPLANT
PACK TOTAL JOINT (CUSTOM PROCEDURE TRAY) ×2 IMPLANT
PAD ARMBOARD 7.5X6 YLW CONV (MISCELLANEOUS) ×4 IMPLANT
PILLOW ABDUCTION HIP (SOFTGOODS) ×2 IMPLANT
PRESSURIZER FEMORAL UNIV (MISCELLANEOUS) IMPLANT
SET HNDPC FAN SPRY TIP SCT (DISPOSABLE) IMPLANT
SPONGE LAP 18X18 X RAY DECT (DISPOSABLE) IMPLANT
STAPLER VISISTAT 35W (STAPLE) ×2 IMPLANT
SUCTION FRAZIER TIP 10 FR DISP (SUCTIONS) ×2 IMPLANT
SUT VIC AB 0 CTB1 27 (SUTURE) ×4 IMPLANT
SUT VIC AB 2-0 CTB1 (SUTURE) ×6 IMPLANT
TOWEL OR 17X24 6PK STRL BLUE (TOWEL DISPOSABLE) ×2 IMPLANT
TOWEL OR 17X26 10 PK STRL BLUE (TOWEL DISPOSABLE) ×2 IMPLANT
TOWER CARTRIDGE SMART MIX (DISPOSABLE) IMPLANT
TRAY FOLEY CATH 14FR (SET/KITS/TRAYS/PACK) ×2 IMPLANT
WATER STERILE IRR 1000ML POUR (IV SOLUTION) ×2 IMPLANT

## 2012-04-06 NOTE — Progress Notes (Signed)
Set up patient on CPAP (10cmh2o) with patients home mask. Humidifier filled with sterile water. RT to place patient on CPAP at 0000 per patient request.

## 2012-04-06 NOTE — H&P (Signed)
[name] is an 61 y.o. male.   Chief Complaint: PAINFUL RIGHT HIP [name] IS A 61 Y/O MALE TREATED FOR DJD OF BOTH HIP ,WITH MORE PAIN IN THE RIGHT  HIP THAN THE LEFT HIP. PATIENT HAS EXPERIENCED MORE PAIN ,LIMITED ACTIVITY DUE TO PAIN AND STIFFNESS.  Past Medical History  Diagnosis Date  . HTN (hypertension)   . COPD (chronic obstructive pulmonary disease)   . Obstructive sleep apnea     sleep study 2006, uses CPAP  . Coronary artery disease     x4  . Myocardial infarction     x2  . Shortness of breath   . Diabetes mellitus without complication   . Anxiety   . Arthritis   . HLD (hyperlipidemia)     Past Surgical History  Procedure Date  . Eye surgery   . Cardiac catheterization 2010    x4     Family History  Problem Relation Age of Onset  . Depression Mother    Social History:  reports that he has been smoking Cigarettes.  He has a 13.5 pack-year smoking history. He does not have any smokeless tobacco history on file. He reports that he does not drink alcohol or use illicit drugs.  Allergies: No Known Allergies  Medications Prior to Admission  Medication Sig Dispense Refill  . amLODipine (NORVASC) 5 MG tablet Take 5 mg by mouth at bedtime.       Marland Kitchen aspirin 81 MG chewable tablet Chew 81 mg by mouth at bedtime.      Marland Kitchen atenolol (TENORMIN) 25 MG tablet Take 25 mg by mouth at bedtime.       . Coenzyme Q10 (COQ10) 100 MG CAPS Take 1 capsule by mouth at bedtime.      . Cyanocobalamin (VITAMIN B-12) 1000 MCG SUBL Place 1 tablet under the tongue See admin instructions. 4 times monthly; Days are picked at random      . fish oil-omega-3 fatty acids 1000 MG capsule Take 1 g by mouth at bedtime.      Marland Kitchen HYDROcodone-acetaminophen (NORCO/VICODIN) 5-325 MG per tablet Take 1 tablet by mouth every 4 (four) hours as needed. For pain      . metFORMIN (GLUCOPHAGE) 500 MG tablet Take 500 mg by mouth 2 (two) times daily with a meal.       . mirtazapine (REMERON) 15 MG tablet Take 15 mg  by mouth at bedtime.      . Multiple Vitamin (MULTIVITAMIN WITH MINERALS) TABS Take 1 tablet by mouth at bedtime.      . naphazoline-pheniramine (NAPHCON-A) 0.025-0.3 % ophthalmic solution Place 1 drop into both eyes 4 (four) times daily as needed. For dry eyes      . naproxen (NAPROSYN) 250 MG tablet Take 250 mg by mouth 2 (two) times daily as needed. For pain      . pravastatin (PRAVACHOL) 40 MG tablet Take 80 mg by mouth at bedtime.       Marland Kitchen tiotropium (SPIRIVA) 18 MCG inhalation capsule Place 18 mcg into inhaler and inhale every morning.      . nitroGLYCERIN (NITROSTAT) 0.4 MG SL tablet Place 0.4 mg under the tongue every 5 (five) minutes as needed. For chest pain      . tadalafil (CIALIS) 20 MG tablet Take 20 mg by mouth daily as needed. For erectile dysfunction        Results for orders placed during the hospital encounter of 04/06/12 (from the past 48 hour(s))  GLUCOSE, CAPILLARY  Status: Abnormal   Collection Time   04/06/12  6:19 AM      Component Value Range Comment   Glucose-Capillary 101 (*) 70 - 99 mg/dL    No results found.  Review of Systems  Constitutional: Negative.   HENT: Negative.   Eyes: Negative.   Respiratory: Negative.   Cardiovascular: Negative.   Gastrointestinal: Negative.   Genitourinary: Negative.   Musculoskeletal: Positive for joint pain.  Skin: Negative.   Neurological: Negative.   Endo/Heme/Allergies: Negative.   Psychiatric/Behavioral: Negative.     Blood pressure 137/93, pulse 82, temperature 98 F (36.7 C), temperature source Oral, resp. rate 18, SpO2 96.00%. Physical Exam RIGHT HIP TENDER ON PASSIVE ROM AND ON WT-BEARING , LIMITED ROTATION AND HIP EXTENTION. X-R REVEALS SCLEROSIS AND LOSS OF JOINT SPACE.  Assessment/Plan DJD RIGHT HIP/PLAN RIGHT THA.  Kennieth Rad 04/06/2012, 7:39 AM

## 2012-04-06 NOTE — Brief Op Note (Signed)
04/06/2012  10:06 AM  PATIENT:  Michelle Piper  61 y.o. male  PRE-OPERATIVE DIAGNOSIS:  djd right hip  POST-OPERATIVE DIAGNOSIS:  djd right hip  PROCEDURE:  Procedure(s) (LRB) with comments: TOTAL HIP ARTHROPLASTY (Right)  SURGEON:  Surgeon(s) and Role:    * Kennieth Rad, MD - Primary  PHYSICIAN ASSISTANT:   ASSISTANTS: none   ANESTHESIA:   general  EBL:  Total I/O In: 1000 [I.V.:1000] Out: 550 [Urine:150; Blood:400]  BLOOD ADMINISTERED:none  DRAINS: none   LOCAL MEDICATIONS USED:  NONE  SPECIMEN:  No Specimen  DISPOSITION OF SPECIMEN:  N/A  COUNTS:  YES  TOURNIQUET:  * No tourniquets in log *  DICTATION: .Other Dictation: Dictation Number REPORT #[id number]  PLAN OF CARE: Admit to inpatient   PATIENT DISPOSITION:  PACU - hemodynamically stable.   Delay start of Pharmacological VTE agent (>24hrs) due to surgical blood loss or risk of bleeding: no

## 2012-04-06 NOTE — Progress Notes (Signed)
O2 sats 86-88% on 4L humidified O2. Pt awake, alert, instructed in use of IS and uses up to 1250cc, strong, non-prod cough effort.  SBP in the 90's. Pt put back on simple mask at 8L and O2sat mid 94-96%.  Dr Gelene Mink updated.  No new orders, will cont to monitor closely.

## 2012-04-06 NOTE — Progress Notes (Signed)
ANTICOAGULATION CONSULT NOTE - Initial Consult  Pharmacy Consult for Coumadin Indication: VTE prophylaxis  No Known Allergies  Patient Measurements:  Weight: 90kg  Vital Signs: Temp: 98.6 F (37 C) (11/21 1249) Temp src: Oral (11/21 0621) BP: 103/58 mmHg (11/21 1251) Pulse Rate: 85  (11/21 1251)  Labs: No results found for this basename: HGB:2,HCT:3,PLT:3,APTT:3,LABPROT:3,INR:3,HEPARINUNFRC:3,CREATININE:3,CKTOTAL:3,CKMB:3,TROPONINI:3 in the last 72 hours  CrCl is unknown because there is no height on file for the current visit.   Medical History: Past Medical History  Diagnosis Date  . HTN (hypertension)   . COPD (chronic obstructive pulmonary disease)   . Obstructive sleep apnea     sleep study 2006, uses CPAP  . Coronary artery disease     x4  . Myocardial infarction     x2  . Shortness of breath   . Diabetes mellitus without complication   . Anxiety   . Arthritis   . HLD (hyperlipidemia)     Medications:  Prescriptions prior to admission  Medication Sig Dispense Refill  . amLODipine (NORVASC) 5 MG tablet Take 5 mg by mouth at bedtime.       Marland Kitchen aspirin 81 MG chewable tablet Chew 81 mg by mouth at bedtime.      Marland Kitchen atenolol (TENORMIN) 25 MG tablet Take 25 mg by mouth at bedtime.       . Coenzyme Q10 (COQ10) 100 MG CAPS Take 1 capsule by mouth at bedtime.      . Cyanocobalamin (VITAMIN B-12) 1000 MCG SUBL Place 1 tablet under the tongue See admin instructions. 4 times monthly; Days are picked at random      . fish oil-omega-3 fatty acids 1000 MG capsule Take 1 g by mouth at bedtime.      Marland Kitchen HYDROcodone-acetaminophen (NORCO/VICODIN) 5-325 MG per tablet Take 1 tablet by mouth every 4 (four) hours as needed. For pain      . metFORMIN (GLUCOPHAGE) 500 MG tablet Take 500 mg by mouth 2 (two) times daily with a meal.       . mirtazapine (REMERON) 15 MG tablet Take 15 mg by mouth at bedtime.      . Multiple Vitamin (MULTIVITAMIN WITH MINERALS) TABS Take 1 tablet by mouth at  bedtime.      . naphazoline-pheniramine (NAPHCON-A) 0.025-0.3 % ophthalmic solution Place 1 drop into both eyes 4 (four) times daily as needed. For dry eyes      . naproxen (NAPROSYN) 250 MG tablet Take 250 mg by mouth 2 (two) times daily as needed. For pain      . pravastatin (PRAVACHOL) 40 MG tablet Take 80 mg by mouth at bedtime.       Marland Kitchen tiotropium (SPIRIVA) 18 MCG inhalation capsule Place 18 mcg into inhaler and inhale every morning.      . nitroGLYCERIN (NITROSTAT) 0.4 MG SL tablet Place 0.4 mg under the tongue every 5 (five) minutes as needed. For chest pain      . tadalafil (CIALIS) 20 MG tablet Take 20 mg by mouth daily as needed. For erectile dysfunction        Assessment: 61yom to start Coumadin for VTE prophylaxis s/p THA.  - Baseline INR: 1.04 - No bleeding complications reported  Goal of Therapy:  INR 2-3   Plan:  1. Coumadin 7.5mg  po x 1 today 2. Daily INR 3. Coumadin educational book/video  Cleon Dew 04/06/2012,1:58 PM

## 2012-04-06 NOTE — Preoperative (Signed)
Beta Blockers   Reason not to administer Beta Blockers:Not Applicable 

## 2012-04-06 NOTE — Anesthesia Preprocedure Evaluation (Addendum)
Anesthesia Evaluation  Patient identified by MRN, date of birth, ID band Patient awake    Reviewed: Allergy & Precautions, H&P , NPO status , Patient's Chart, lab work & pertinent test results, reviewed documented beta blocker date and time   Airway Mallampati: I TM Distance: >3 FB Neck ROM: full    Dental  (+) Teeth Intact and Missing   Pulmonary shortness of breath and with exertion, sleep apnea , COPD COPD inhaler,  breath sounds clear to auscultation        Cardiovascular hypertension, + CAD, + Past MI and + Cardiac Stents Rhythm:regular     Neuro/Psych PSYCHIATRIC DISORDERS negative neurological ROS     GI/Hepatic negative GI ROS, Neg liver ROS,   Endo/Other  diabetes, Oral Hypoglycemic Agents  Renal/GU negative Renal ROS  negative genitourinary   Musculoskeletal   Abdominal   Peds  Hematology negative hematology ROS (+)   Anesthesia Other Findings See surgeon's H&P   Reproductive/Obstetrics negative OB ROS                          Anesthesia Physical Anesthesia Plan  ASA: III  Anesthesia Plan: General   Post-op Pain Management:    Induction: Intravenous  Airway Management Planned: Oral ETT  Additional Equipment:   Intra-op Plan:   Post-operative Plan: Extubation in OR  Informed Consent: I have reviewed the patients History and Physical, chart, labs and discussed the procedure including the risks, benefits and alternatives for the proposed anesthesia with the patient or authorized representative who has indicated his/her understanding and acceptance.   Dental Advisory Given  Plan Discussed with: CRNA and Surgeon  Anesthesia Plan Comments:         Anesthesia Quick Evaluation

## 2012-04-06 NOTE — Progress Notes (Signed)
UR COMPLETED  

## 2012-04-06 NOTE — Anesthesia Procedure Notes (Signed)
Procedure Name: Intubation Date/Time: 04/06/2012 7:55 AM Performed by: Quentin Ore Pre-anesthesia Checklist: Patient identified, Emergency Drugs available, Suction available, Patient being monitored and Timeout performed Patient Re-evaluated:Patient Re-evaluated prior to inductionOxygen Delivery Method: Circle system utilized Preoxygenation: Pre-oxygenation with 100% oxygen Intubation Type: IV induction Laryngoscope Size: Mac and 4 Grade View: Grade I Tube type: Oral Tube size: 7.5 mm Number of attempts: 1 Airway Equipment and Method: Stylet Placement Confirmation: ETT inserted through vocal cords under direct vision,  positive ETCO2 and breath sounds checked- equal and bilateral Secured at: 23 cm Dental Injury: Teeth and Oropharynx as per pre-operative assessment

## 2012-04-06 NOTE — H&P (Signed)
[name], KOPACZ NO.:  [id number]  MEDICAL RECORD NO.:  [id number]  LOCATION:  [id number]                        FACILITY:  [facility]  PHYSICIAN:  Myrtie Neither, MD      DATE OF BIRTH:  [birth year]  DATE OF ADMISSION:  04/06/2012 DATE OF DISCHARGE:                             HISTORY & PHYSICAL   CHIEF COMPLAINT:  Painful right hip.  HISTORY OF PRESENT ILLNESS:  This is a 61 year old male who has been treated for bilateral hip degenerative joint disease, right being worse than the left as far as pain and loss of function.  The patient has been treated with anti-inflammatories including OR steroid, pain medication and with use of cane.  The patient has progressively lost more function and increased pain in the right hip over the past few months.  Pain is both at rest as well as on ambulation, difficult to getting up from the sitting position and with stair climbing.  PAST MEDICAL HISTORY: 1. Hypertension. 2. COPD. 3. Sleep apnea. 4. Coronary artery disease. 5. Myocardial infarction x2. 6. Diabetes mellitus. 7. Anxiety. 8. Degenerative joint disease. 9. Hyperlipidemia.  SURGICAL HISTORY:  Eye surgery, cardiac catheterization.  ALLERGIES:  None known.  FAMILY HISTORY:  Negative and noncontributory.  SOCIAL HISTORY:  The patient denies use of alcohol, tobacco, or illegal drugs.  REVIEW OF SYSTEMS:  Some episodes of shortness of breath.  No recent history of chest pain, symptoms of degenerative disk disease.  No urinary or bowel symptoms.  PHYSICAL EXAMINATION:  VITAL SIGNS:  Temperature 98, pulse 82, respiration 18, blood pressure 137/93.  Height 5 feet 9 inches, weight 90.084 kg. HEAD:  Normocephalic. EYES:  Conjunctivae sclerae clear. NECK:  Supple. CHEST:  Clear. CARDIAC:  S1, S2 regular. EXTREMITIES:  Right hip tender anterior and laterally on attempted to rotation and hip extension.  Left hip also has limited range of motion with rotation and  hip extension, but nontender. NEUROLOGIC:  Neurovascular status is intact.  IMAGING:  X-rays reveals degenerative joint disease with sclerosis and osteophytic changes, and loss of joint space in the right hip.  Left hip x-ray, severe loss of joint space with sclerosis.  IMPRESSION:  Symptomatic right hip degenerative joint disease.  PLAN:  Right total hip arthroplasty.     Myrtie Neither, MD     AC/MEDQ  D:  04/06/2012  T:  04/06/2012  Job:  [id number]

## 2012-04-06 NOTE — Transfer of Care (Signed)
Immediate Anesthesia Transfer of Care Note  Patient: [name]  Procedure(s) Performed: Procedure(s) (LRB) with comments: TOTAL HIP ARTHROPLASTY (Right)  Patient Location: PACU  Anesthesia Type:General  Level of Consciousness: awake, alert  and oriented  Airway & Oxygen Therapy: Patient Spontanous Breathing and Patient connected to face mask oxygen  Post-op Assessment: Report given to PACU RN, Post -op Vital signs reviewed and stable and Patient moving all extremities X 4  Post vital signs: Reviewed and stable  Complications: No apparent anesthesia complications

## 2012-04-06 NOTE — Plan of Care (Signed)
Problem: Consults Goal: Diagnosis- Total Joint Replacement Primary Total Hip     

## 2012-04-06 NOTE — Anesthesia Postprocedure Evaluation (Signed)
Anesthesia Post Note  Patient: [name]  Procedure(s) Performed: Procedure(s) (LRB): TOTAL HIP ARTHROPLASTY (Right)  Anesthesia type: general  Patient location: PACU  Post pain: Pain level controlled  Post assessment: Patient's Cardiovascular Status Stable  Last Vitals:  Filed Vitals:   04/06/12 1251  BP: 103/58  Pulse: 85  Temp:   Resp: 18    Post vital signs: Reviewed and stable  Level of consciousness: sedated  Complications: No apparent anesthesia complications

## 2012-04-07 ENCOUNTER — Other Ambulatory Visit: Payer: Self-pay | Admitting: Orthopedic Surgery

## 2012-04-07 ENCOUNTER — Encounter (HOSPITAL_COMMUNITY): Payer: Self-pay | Admitting: Orthopedic Surgery

## 2012-04-07 LAB — GLUCOSE, CAPILLARY
Glucose-Capillary: 115 mg/dL — ABNORMAL HIGH (ref 70–99)
Glucose-Capillary: 161 mg/dL — ABNORMAL HIGH (ref 70–99)

## 2012-04-07 LAB — PROTIME-INR: INR: 1.07 (ref 0.00–1.49)

## 2012-04-07 MED ORDER — CLOPIDOGREL BISULFATE 75 MG PO TABS
75.0000 mg | ORAL_TABLET | Freq: Every day | ORAL | Status: DC
  Administered 2012-04-07 – 2012-04-10 (×4): 75 mg via ORAL
  Filled 2012-04-07 (×5): qty 1

## 2012-04-07 MED ORDER — ALBUTEROL SULFATE (5 MG/ML) 0.5% IN NEBU
2.5000 mg | INHALATION_SOLUTION | RESPIRATORY_TRACT | Status: DC | PRN
  Administered 2012-04-07 – 2012-04-10 (×4): 2.5 mg via RESPIRATORY_TRACT
  Filled 2012-04-07 (×4): qty 0.5

## 2012-04-07 MED ORDER — WARFARIN SODIUM 10 MG PO TABS
10.0000 mg | ORAL_TABLET | Freq: Once | ORAL | Status: AC
  Administered 2012-04-07: 10 mg via ORAL
  Filled 2012-04-07: qty 1

## 2012-04-07 NOTE — Progress Notes (Signed)
Subjective: 1 Day Post-Op Procedure(s) (LRB): TOTAL HIP ARTHROPLASTY (Right) Patient reports pain as 5 on 0-10 scale.    Objective: Vital signs in last 24 hours: Temp:  [97.6 F (36.4 C)-98.7 F (37.1 C)] 98.3 F (36.8 C) (11/22 0610) Pulse Rate:  [70-91] 81  (11/22 0610) Resp:  [11-93] 16  (11/22 0800) BP: (92-109)/(58-80) 105/75 mmHg (11/22 0610) SpO2:  [88 %-100 %] 94 % (11/22 0849) FiO2 (%):  [2 %] 2 % (11/21 1934) Weight:  [90.1 kg (198 lb 10.2 oz)] 90.1 kg (198 lb 10.2 oz) (11/21 1300)  Intake/Output from previous day: 11/21 0701 - 11/22 0700 In: 1790 [P.O.:240; I.V.:1550] Out: 1500 [Urine:1100; Blood:400] Intake/Output this shift:    No results found for this basename: HGB:5 in the last 72 hours No results found for this basename: WBC:2,RBC:2,HCT:2,PLT:2 in the last 72 hours No results found for this basename: NA:2,K:2,CL:2,CO2:2,BUN:2,CREATININE:2,GLUCOSE:2,CALCIUM:2 in the last 72 hours  Basename 04/07/12 0500  LABPT --  INR 1.07    Neurovascular intact Dorsiflexion/Plantar flexion intact  Assessment/Plan: 1 Day Post-Op Procedure(s) (LRB): TOTAL HIP ARTHROPLASTY (Right) Advance diet Up with therapy  Netty Sullivant F 04/07/2012, 10:08 AM

## 2012-04-07 NOTE — Evaluation (Signed)
Physical Therapy Evaluation Patient Details Name: [name] MRN: [id number] DOB: 1950/08/20 Today's Date: 04/07/2012 Time: 4098-1191 PT Time Calculation (min): 43 min  PT Assessment / Plan / Recommendation Clinical Impression  Pt. admitted for R posterior THA on 11.21.13. Pt. was SOB with sats down to 81% on 2 l during therapy. Pt. encoured pursed lip breathing. sats back to 91%. Pt. plans DC to home. Pt. will benefit fromPT to improve in functional mobility and safety . Pt requires frequent cues for precautions and safety.     PT Assessment  Patient needs continued PT services    Follow Up Recommendations  Home health PT;Supervision/Assistance - 24 hour    Does the patient have the potential to tolerate intense rehabilitation      Barriers to Discharge        Equipment Recommendations  Rolling walker with 5" wheels;3 in 1 bedside comode    Recommendations for Other Services OT consult   Frequency 7X/week    Precautions / Restrictions Precautions Precautions: Posterior Hip Precaution Booklet Issued: No Precaution Comments: Handout for precautions reviewed. Pt. needs lots of reinforcement. Restrictions Weight Bearing Restrictions: Yes RLE Weight Bearing: Partial weight bearing RLE Partial Weight Bearing Percentage or Pounds: 50%   Pertinent Vitals/Pain PCA used, 7/10 R hip     Mobility  Bed Mobility Bed Mobility: Supine to Sit Supine to Sit: 3: Mod assist Details for Bed Mobility Assistance: VC for hip precautions and for safety., slow process down for respiratory distress , maintained O2 during session Transfers Transfers: Sit to Stand;Stand to Sit Sit to Stand: 1: +2 Total assist;From bed;With upper extremity assist;From chair/3-in-1 Sit to Stand: Patient Percentage: 50% Stand to Sit: 1: +2 Total assist;To chair/3-in-1;With upper extremity assist Stand to Sit: Patient Percentage: 50% Details for Transfer Assistance: verbal cues for safety and for  precautions. Pt. stood again for use of urinal. Ambulation/Gait Ambulation/Gait Assistance: 1: +2 Total assist Ambulation/Gait: Patient Percentage: 60% Ambulation Distance (Feet): 20 Feet Assistive device: Rolling walker Ambulation/Gait Assistance Details: VC for sequence and for posture, breathing pattern as pt. became SOB during ambulation. RN notified. Gait Pattern: Step-to pattern;Decreased step length - right;Decreased stance time - right;Trunk flexed Gait velocity: decreased    Shoulder Instructions     Exercises     PT Diagnosis: Difficulty walking;Generalized weakness;Acute pain  PT Problem List: Decreased strength;Decreased range of motion;Decreased activity tolerance;Decreased mobility;Decreased knowledge of use of DME;Decreased safety awareness;Decreased knowledge of precautions;Pain;Cardiopulmonary status limiting activity PT Treatment Interventions: DME instruction;Gait training;Stair training;Functional mobility training;Therapeutic activities;Therapeutic exercise;Patient/family education   PT Goals Acute Rehab PT Goals PT Goal Formulation: With patient Time For Goal Achievement: 04/14/12 Potential to Achieve Goals: Good Pt will go Supine/Side to Sit: with min assist PT Goal: Supine/Side to Sit - Progress: Goal set today Pt will go Sit to Supine/Side: with min assist PT Goal: Sit to Supine/Side - Progress: Goal set today Pt will go Sit to Stand: with supervision PT Goal: Sit to Stand - Progress: Goal set today Pt will Ambulate: 51 - 150 feet;with supervision;with rolling walker PT Goal: Ambulate - Progress: Goal set today Pt will Perform Home Exercise Program: with min assist PT Goal: Perform Home Exercise Program - Progress: Goal set today Additional Goals Additional Goal #1: STATE AND DEMO  POSTERIOR HIP [PRECAUTIONS PT Goal: Additional Goal #1 - Progress: Goal set today  Visit Information  Last PT Received On: 04/07/12 Assistance Needed: +2    Subjective  Data  Subjective: what is my agenda  I  already sat up on the side of the bed x 2 hours and stood up with my wife's help(no RW in room!) Patient Stated Goal: to get up and walk., go home   Prior Functioning  Home Living Lives With: Spouse Available Help at Discharge: Family;Available 24 hours/day Type of Home: House Home Access: Stairs to enter [employer] of Steps: 1 Home Layout: One level Home Adaptive Equipment: None Prior Function Level of Independence: Independent Able to Take Stairs?: Yes Driving: Yes Communication Communication: No difficulties    Cognition  Overall Cognitive Status: Appears within functional limits for tasks assessed/performed Arousal/Alertness: Awake/alert Orientation Level: Appears intact for tasks assessed Behavior During Session: Anxious Cognition - Other Comments: Pt. became dyspneic and was more anxious, requires frequent redirection for safety and Precautions.    Extremity/Trunk Assessment Right Lower Extremity Assessment RLE ROM/Strength/Tone: Deficits RLE ROM/Strength/Tone Deficits: Pt. had put straps of abd. pillow together and was moving Leg in bed. . Pt used strap to assit to get leg to edge. with assitance also from PT, RLE Sensation: WFL - Light Touch Left Lower Extremity Assessment LLE ROM/Strength/Tone: WFL for tasks assessed LLE Sensation: WFL - Light Touch Trunk Assessment Trunk Assessment: Normal   Balance    End of Session PT - End of Session Activity Tolerance: Patient limited by fatigue;Patient limited by pain (dyspnea 3/4) Patient left: in chair;with call bell/phone within reach Nurse Communication: Mobility status  GP     Rada Hay 04/07/2012, 2:11 PM  [phone]

## 2012-04-07 NOTE — Progress Notes (Signed)
OT Cancellation Note  Patient Details Name: [name] MRN: [id number] DOB: 06-10-1950   Cancelled Treatment:    Reason Eval/Treat Not Completed: Pain limiting ability to participate (cancel refusal) PT currently reporting that PCA is not providing pain relief. Pt and wife educated on asking for PRN medication from RN and provided ice pack to Rt hip. Pt with poor pain management and refusing therapy due to severe pain/ spasms.  Ot to reattempt 04/08/12  Lucile Shutters Pager: [phone]  04/07/2012, 3:08 PM

## 2012-04-07 NOTE — Progress Notes (Signed)
Pt is on CPAP of 10cmH2O with home full face cpap mask and [place] cpap unit. Vitals are stable HR. 102. RR 19. SPO2 92% with 2LPM oxygen.Pt is comfortable and RT will continue to monitor.

## 2012-04-07 NOTE — Op Note (Signed)
NAMEKERT, MAKOSKY NO.:  [id number]  MEDICAL RECORD NO.:  [id number]  LOCATION:  5N25C                        FACILITY:  [facility]  PHYSICIAN:  Myrtie Neither, MD      DATE OF BIRTH:  17-May-1951  DATE OF PROCEDURE:  04/06/2012 DATE OF DISCHARGE:                              OPERATIVE REPORT   PREOPERATIVE DIAGNOSIS:  Degenerative joint arthropathy, right hip.  POSTOPERATIVE DIAGNOSIS:  Degenerative joint arthropathy, right hip.  ANESTHESIA:  General.  PROCEDURE:  Right total hip arthroplasty, Biomet implant.  DESCRIPTION OF PROCEDURE:  The patient was taken to the operating room. After given adequate preop medications, given general anesthesia and intubated.  The patient was placed in right lateral position.  Right hip was prepped with DuraPrep and draped in a sterile manner.  Posterior Southern approach was made over the right hip going through the skin and subcutaneous tissue down to the fascia.  Short rotators were identified and released.  Capsule was identified and resected.  The hip was then dislocated.  Femoral neck cutting jig was put in place and line of cut was bovied.  Next, with an oscillating saw, the femoral neck cut was then made removing the head.  Sizing of the head was 48 mm.  Soft tissue resection about the acetabulum was then done, allowing complete exposure of the acetabulum.  With retractors in place, reaming of the acetabulum was dried out with 48 mm and proceeded up to 56 mm.  This was down to good bleeding surface.  Trial implant was put in place and was found to seat well.  Acetabular shell 3-hole, porous-coated 56 mm with the use of the acetabular guide was put in place with some extra anteversion.  This seated quite well with a good solid fit.  Two acetabular screws were then placed.  The trial acetabular 10-degree liner, 36-mm was then put in place.  Next, attention was turned to the proximal femur.  Reaming down the femoral canal  was initiated with the use of cookie cutter followed by proximal reamer, then canal Finders.  Rasping was done up to a 9-mm stem, which was very good filling proximally and good scratch against the cortical surface.  A standard femoral head, neck was put in place.  An attempted reduction was found to be too tight.  This was removed, -3-mm, 36-mm femoral head was put in place.  Trial reduction was found to be very good snug, good flexion and extension, good rotation, no telescoping with good leg length.  Copious irrigation was then done.  With range of motion being good, leg length being good as well as stability of the component, trial components were removed.  A 10- degree 36-mm liner was snapped in place and found to be stable.  Next, the porous-coated 9 mm x 137 mm standard stem was hammered in place and finally, the -3, 36-mm head was put in place.  The hip was reduced. Again, full flexion, good extension, no telescoping, no subluxing and very good leg length.  Copious irrigation was then done again.  Follow up of wound closure, 0 Vicryl for the fascia, 2-0 for the subcutaneous, and skin staples for the skin.  Compressive dressing was applied.  Hip abduction pillow was applied. The patient tolerated the procedure quite well, went to the recovery room in stable and satisfactory condition.     Myrtie Neither, MD     AC/MEDQ  D:  04/06/2012  T:  04/07/2012  Job:  [id number]

## 2012-04-07 NOTE — Progress Notes (Signed)
PT Cancellation Note  Patient Details Name: [name] MRN: [id number] DOB: 09-06-1950   Cancelled Treatment:     Pt. Declined  Treatment this PM. States that he is in too much pain.   Rada Hay 04/07/2012, 3:46 PM [phone]

## 2012-04-07 NOTE — Progress Notes (Signed)
CARE MANAGEMENT NOTE 04/07/2012  Patient:  [name], [name]   Account Number:  [id number]  Date Initiated:  04/07/2012  Documentation initiated by:  Vance Peper  Subjective/Objective Assessment:   61 yr old male s/p right total hip arthroplasty.     Action/Plan:   CM spoke with patient concerning home health and DME needs at discharge.Choice offered. Patient also requested to speak with financial counselor. Provided also the number for patient accounting.   Anticipated DC Date:  04/08/2012   Anticipated DC Plan:  HOME W HOME HEALTH SERVICES  In-house referral  Financial Counselor      DC Planning Services  CM consult      PAC Choice  DURABLE MEDICAL EQUIPMENT  HOME HEALTH   Choice offered to / List presented to:  C-1 Patient   DME arranged  3-N-1      DME agency  [facility]     HH arranged  HH-1 RN  HH-2 PT      [facility] agency  [facility]   Status of service:  Completed, signed off Medicare Important Message given?   (If response is "NO", the following Medicare IM given date fields will be blank) Date Medicare IM given:   Date Additional Medicare IM given:    Discharge Disposition:  HOME W HOME HEALTH SERVICES  Per UR Regulation:    If discussed at Long Length of Stay Meetings, dates discussed:    Comments:

## 2012-04-07 NOTE — Progress Notes (Signed)
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   Assessment: 61 y.o. 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 No CBC today, no s/sx of bleeding noted. Warfarin points~8, will increase warfarin dose slightly today. The patient was educated on warfarin today -- all questions were addressed.  Goal of Therapy:  INR 2-3 Monitor platelets by anticoagulation protocol: Yes   Plan:  1. Warfarin 10 mg x 1 dose at 1800 today 2. CBC q72h while on warfarin  3. Will continue to monitor for any signs/symptoms of bleeding and will follow up with PT/INR in the a.m.   Georgina Pillion, PharmD, BCPS Clinical Pharmacist Pager: [phone] 04/07/2012 9:33 AM

## 2012-04-08 LAB — CBC
HCT: 30.5 % — ABNORMAL LOW (ref 39.0–52.0)
MCHC: 35.7 g/dL (ref 30.0–36.0)
MCV: 83.8 fL (ref 78.0–100.0)
RDW: 13.7 % (ref 11.5–15.5)

## 2012-04-08 MED ORDER — WARFARIN SODIUM 7.5 MG PO TABS
7.5000 mg | ORAL_TABLET | Freq: Once | ORAL | Status: AC
  Administered 2012-04-08: 7.5 mg via ORAL
  Filled 2012-04-08: qty 1

## 2012-04-08 NOTE — Progress Notes (Signed)
Pt seen awake in bed watching TV. No concern voiced at this time.

## 2012-04-08 NOTE — Evaluation (Signed)
Occupational Therapy Evaluation Patient Details Name: [name] MRN: [id number] DOB: [birth year] Today's Date: 04/08/2012 Time: 1151-1300 OT Time Calculation (min): 69 min  OT Assessment / Plan / Recommendation Clinical Impression  This 61 y.o. male admitted for Rt. THA, and h/o COPD with intermittent 02 use PTA.  Pt. became very dyspneic with minimal activity 4/4 and sats dropped 81 with transfer to chair.  Pt. requires multiple rest breaks with all activity.  Pt. will benefit from OT to maximize safety and independence with BADLs to allow pt. to return to modified independent level.  Anticipate pt. will require post acute rehab at discharge     OT Assessment  Patient needs continued OT Services    Follow Up Recommendations  SNF;CIR    Barriers to Discharge None    Equipment Recommendations  3 in 1 bedside comode;Tub/shower bench;Rolling walker with 5" wheels    Recommendations for Other Services    Frequency  Min 2X/week    Precautions / Restrictions Precautions Precautions: Posterior Hip Precaution Booklet Issued: No Precaution Comments: Reviewed hip precautions.  Unable to recall them initially Restrictions Weight Bearing Restrictions: Yes RLE Weight Bearing: Partial weight bearing RLE Partial Weight Bearing Percentage or Pounds: 50%       ADL  Eating/Feeding: Independent Where Assessed - Eating/Feeding: Chair Grooming: Wash/dry face;Teeth care;Set up Where Assessed - Grooming: Supported sitting Upper Body Bathing: Supervision/safety Where Assessed - Upper Body Bathing: Supported sitting Lower Body Bathing: Maximal assistance Where Assessed - Lower Body Bathing: Supported sit to stand Upper Body Dressing: Minimal assistance Where Assessed - Upper Body Dressing: Unsupported sitting Lower Body Dressing: +1 Total assistance Where Assessed - Lower Body Dressing: Supported sit to Pharmacist, hospital: +2 Total assistance Toilet Transfer: Patient Percentage:  70% Statistician Method: Surveyor, minerals: Other (comment) (transfer to recliner) Toileting - Clothing Manipulation and Hygiene: +1 Total assistance Where Assessed - Glass blower/designer Manipulation and Hygiene: Standing Equipment Used: Rolling walker;Gait belt Transfers/Ambulation Related to ADLs: total A +2 (pt ~70%) ADL Comments: Pt. initially refusing OT/PT stating he hasn't slept.  Able to convince pt. re: necessity for prevention of PNA.  Pt. dyspneic 3/4 with talking and 4/4 upon standing, sats 80-81% on 2L.  RT in and provided breathing treatment and increased 02 to 4L.   Pt. wanting to go into BR and brush teeth.  Pt. stood with total A+2 (pt ~70%), but only able to tolerate static standing for ~40 seconds before dyspnea 4/4 and pt. needing to sit.  Pt. moved to sitting position and brushed teeth seated in recliner.      OT Diagnosis: Generalized weakness;Acute pain  OT Problem List: Decreased strength;Decreased activity tolerance;Impaired balance (sitting and/or standing);Decreased knowledge of use of DME or AE;Decreased safety awareness;Decreased knowledge of precautions;Pain OT Treatment Interventions: Self-care/ADL training;DME and/or AE instruction;Therapeutic activities;Patient/family education;Balance training   OT Goals Acute Rehab OT Goals OT Goal Formulation: With patient Time For Goal Achievement: 04/22/12 Potential to Achieve Goals: Good ADL Goals Pt Will Perform Grooming: with supervision;Standing at sink ADL Goal: Grooming - Progress: Goal set today Pt Will Perform Upper Body Dressing: with set-up ADL Goal: Upper Body Dressing - Progress: Goal set today Pt Will Perform Lower Body Dressing: with min assist;with adaptive equipment;Sit to stand from bed;Sit to stand from chair ADL Goal: Lower Body Dressing - Progress: Goal set today Pt Will Transfer to Toilet: Ambulation;3-in-1;with supervision ADL Goal: Toilet Transfer - Progress: Goal set  today Pt Will Perform Toileting - Clothing Manipulation:  with supervision;Standing ADL Goal: Toileting - Clothing Manipulation - Progress: Goal set today Pt Will Perform Tub/Shower Transfer: with supervision;Transfer tub bench;Ambulation ADL Goal: Tub/Shower Transfer - Progress: Goal set today Additional ADL Goal #1: pt. will participate in 25 mins of activity with no more than one rest break ADL Goal: Additional Goal #1 - Progress: Goal set today Additional ADL Goal #2: Pt. will incorporate energy conservation into daily tasks with min cues ADL Goal: Additional Goal #2 - Progress: Goal set today  Visit Information  Last OT Received On: 04/08/12 Assistance Needed: +2    Subjective Data  Subjective: "My goal is to go into that bathroom and brush my teeth" Patient Stated Goal: To take dancing lessons   Prior Functioning     Home Living Lives With: Spouse Available Help at Discharge: Family;Available 24 hours/day Type of Home: House Home Access: Stairs to enter [employer] of Steps: 1 Home Layout: One level Bathroom Shower/Tub: Tub/shower unit;Door;Curtain (door in one bathroom and curtain in the other) Bathroom Toilet: Standard Bathroom Accessibility: Yes How Accessible: Accessible via walker Home Adaptive Equipment: None;Other (comment) (Intermittent Home 02) Prior Function Level of Independence: Independent Able to Take Stairs?: Yes Driving: Yes (occasionally) Vocation: Retired Comments: Pt. reports he was ambulating only short distances due to dyspnea Communication Communication: No difficulties Dominant Hand: Right         Vision/Perception     Cognition  Overall Cognitive Status: Appears within functional limits for tasks assessed/performed Arousal/Alertness: Awake/alert Orientation Level: Appears intact for tasks assessed Behavior During Session: [facility] for tasks performed    Extremity/Trunk Assessment Right Upper Extremity Assessment RUE  ROM/Strength/Tone: Within functional levels RUE Coordination: WFL - gross/fine motor Left Upper Extremity Assessment LUE ROM/Strength/Tone: Within functional levels LUE Coordination: WFL - gross/fine motor Trunk Assessment Trunk Assessment: Normal     Mobility Bed Mobility Bed Mobility: Supine to Sit Supine to Sit: 3: Mod assist Details for Bed Mobility Assistance: VC for hip precautions and for safety., slow process down for respiratory distress , maintained O2 during session Transfers Transfers: Sit to Stand;Stand to Sit Sit to Stand: 1: +2 Total assist;From bed;From chair/3-in-1;With upper extremity assist Sit to Stand: Patient Percentage: 70% Stand to Sit: 1: +2 Total assist;To chair/3-in-1;To bed;With upper extremity assist Stand to Sit: Patient Percentage: 70% Details for Transfer Assistance: Pt requires verbal cues for THA prec; proper hand placement and technique     Shoulder Instructions     Exercise     Balance     End of Session OT - End of Session Equipment Utilized During Treatment: Gait belt Activity Tolerance: Patient limited by fatigue (requires several rest breaks due to dyspnea) Patient left: in chair;with call bell/phone within reach Nurse Communication: Patient requests pain meds;Other (comment) (resp status)  GO     Elison Worrel M 04/08/2012, 2:31 PM

## 2012-04-08 NOTE — Progress Notes (Signed)
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   Assessment: 61 y.o. 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 Due to jump in INR, will back off dose slightly this evening and re-evaluate INR in the AM. H/H 10.9/30.5, Plts 201, no evidence of bleeding reported.   Goal of Therapy:  INR 2-3 Monitor platelets by anticoagulation protocol: Yes   Plan:  - Warfarin 7.5 mg x 1 dose at 1800 today - Daily PT/INR - Monitor for bleeding - Trend Temp/WBC  Abran Duke, PharmD Clinical Pharmacist Phone: [phone] Pager: [phone] 04/08/2012 9:34 AM

## 2012-04-08 NOTE — Progress Notes (Signed)
Pt will not be wearing CPAP tonight, as he is currently on a PCA pump. The contraindication of CPAP while using the CO2 detecting apparatus was explained to the patient, and he is satisfied with the explanation.

## 2012-04-08 NOTE — Progress Notes (Signed)
Subjective: 2 Days Post-Op Procedure(s) (LRB): TOTAL HIP ARTHROPLASTY (Right) Patient reports pain as 4 on 0-10 scale.    Objective: Vital signs in last 24 hours: Temp:  [97.7 F (36.5 C)-101 F (38.3 C)] 101 F (38.3 C) (11/23 0622) Pulse Rate:  [94-102] 94  (11/23 0622) Resp:  [16-20] 20  (11/23 1212) BP: (130-143)/(77-92) 130/81 mmHg (11/23 0622) SpO2:  [90 %-100 %] 90 % (11/23 1218)  Intake/Output from previous day: 11/22 0701 - 11/23 0700 In: 960 [P.O.:960] Out: 850 [Urine:850] Intake/Output this shift:     Basename 04/08/12 0725  HGB 10.9*    Basename 04/08/12 0725  WBC 11.3*  RBC 3.64*  HCT 30.5*  PLT 201   No results found for this basename: NA:2,K:2,CL:2,CO2:2,BUN:2,CREATININE:2,GLUCOSE:2,CALCIUM:2 in the last 72 hours  Basename 04/08/12 0725 04/07/12 0500  LABPT -- --  INR 1.49 1.07    Neurovascular intact Dorsiflexion/Plantar flexion intact  Assessment/Plan: 2 Days Post-Op Procedure(s) (LRB): TOTAL HIP ARTHROPLASTY (Right) Advance diet Up with therapy  [name] F 04/08/2012, 1:14 PM

## 2012-04-08 NOTE — Progress Notes (Signed)
Physical Therapy Treatment Patient Details Name: [name] MRN: [id number] DOB: 21-Feb-1951 Today's Date: 04/08/2012 Time: 1150-1300 PT Time Calculation (min): 70 min  PT Assessment / Plan / Recommendation Comments on Treatment Session  Pt with DOE and at rest today.  Transfered to chair and received breathing treatment then tried ambulation again but pt unable secondary to respiratory status.  Long discussion on importance of OOB and activity.  Pt  worked hard during session within his breathing limits.  Needs frequent rest breaks and constant cues to focus on breathing throughout treatment.    Follow Up Recommendations  Home health PT;Supervision/Assistance - 24 hour           Equipment Recommendations  3 in 1 bedside comode;Tub/shower bench;Rolling walker with 5" wheels    Recommendations for Other Services    Frequency 7X/week   Plan Discharge plan remains appropriate;Frequency remains appropriate    Precautions / Restrictions Precautions Precautions: Posterior Hip Precaution Booklet Issued: No Precaution Comments: Reviewed hip precautions.  Unable to recall them initially Restrictions Weight Bearing Restrictions: Yes RLE Weight Bearing: Partial weight bearing RLE Partial Weight Bearing Percentage or Pounds: 50%   Pertinent Vitals/Pain EHR up to 130 and SaO2 down to 80%.  Recovers with cues and increased time.  SaO2 90-94% at rest and RHR 110.    Mobility  Bed Mobility Bed Mobility: Supine to Sit Supine to Sit: 3: Mod assist Details for Bed Mobility Assistance: VC for hip precautions and for safety., slow process down for respiratory distress , maintained O2 during session Transfers Transfers: Sit to Stand;Stand to Sit;Stand Pivot Transfers Sit to Stand: 1: +2 Total assist;From bed;From chair/3-in-1;With upper extremity assist Sit to Stand: Patient Percentage: 70% Stand to Sit: 1: +2 Total assist;To chair/3-in-1;To bed;With upper extremity assist Stand to Sit:  Patient Percentage: 70% Stand Pivot Transfers: 1: +2 Total assist Stand Pivot Transfers: Patient Percentage: 70% Details for Transfer Assistance: Pt requires verbal cues for THA prec; proper hand placement and technique Ambulation/Gait Ambulation/Gait Assistance: Other (comment) (Pt unable secondary to dropping SaO2)       PT Goals Acute Rehab PT Goals PT Goal: Supine/Side to Sit - Progress: Progressing toward goal PT Goal: Sit to Stand - Progress: Progressing toward goal PT Goal: Ambulate - Progress: Not progressing Additional Goals PT Goal: Additional Goal #1 - Progress: Progressing toward goal  Visit Information  Last PT Received On: 04/08/12 Assistance Needed: +2 PT/OT Co-Evaluation/Treatment: Yes    Subjective Data  Subjective: Pt initially refusing treatment but then agreeable and cooperative with encouragement and reasoning.   Cognition  Overall Cognitive Status: Appears within functional limits for tasks assessed/performed Arousal/Alertness: Awake/alert Orientation Level: Appears intact for tasks assessed Behavior During Session: [facility] for tasks performed         End of Session PT - End of Session Equipment Utilized During Treatment: Gait belt;Oxygen Activity Tolerance: Patient limited by fatigue;Patient limited by pain Patient left: in chair;with call bell/phone within reach Nurse Communication: Mobility status;Patient requests pain meds;Other (comment) (Need for breathing treatment and DOE)        Newell Coral 04/08/2012, 2:10 PM  Newell Coral, PTA Acute Rehab [phone] (office)

## 2012-04-09 MED ORDER — BISACODYL 5 MG PO TBEC
5.0000 mg | DELAYED_RELEASE_TABLET | Freq: Every day | ORAL | Status: DC | PRN
  Administered 2012-04-09: 5 mg via ORAL
  Filled 2012-04-09: qty 1

## 2012-04-09 MED ORDER — WARFARIN SODIUM 10 MG PO TABS
10.0000 mg | ORAL_TABLET | Freq: Once | ORAL | Status: AC
  Administered 2012-04-09: 10 mg via ORAL
  Filled 2012-04-09: qty 1

## 2012-04-09 NOTE — Progress Notes (Signed)
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  No Known Allergies  Patient Measurements: Height: 5' 8.9" (175 cm) Weight: 198 lb 10.2 oz (90.1 kg) IBW/kg (Calculated) : 70.47   Vital Signs: Temp: 100 F (37.8 C) (11/24 0632) Temp src: Oral (11/24 1610) BP: 129/86 mmHg (11/24 0632) Pulse Rate: 95  (11/24 0632)  Labs:  Basename 04/09/12 0628 04/08/12 0725 04/07/12 0500  HGB -- 10.9* --  HCT -- 30.5* --  PLT -- 201 --  APTT -- -- --  LABPROT 17.3* 17.6* 13.8  INR 1.46 1.49 1.07  HEPARINUNFRC -- -- --  CREATININE -- -- --  CKTOTAL -- -- --  CKMB -- -- --  TROPONINI -- -- --    Estimated Creatinine Clearance: 92.2 ml/min (by C-G formula based on Cr of 0.92).   Assessment: 61 y.o. M on warfarin for VTE px s/p R-THA on 11/21 with an INR of 1.46 this am (down from 1.49 on 11/23, but had jump from 1.07 on 11/22). H/H 10.9/30.5, Plts 201, no evidence of bleeding reported, renal function stable from pre-op labs.   Goal of Therapy:  INR 2-3 Monitor platelets by anticoagulation protocol: Yes   Plan:  - Warfarin 10 mg x 1 dose at 1800 today - Daily PT/INR - Monitor for bleeding - Trend Temp/WBC  Abran Duke, PharmD Clinical Pharmacist Phone: [phone] Pager: [phone] 04/09/2012 7:45 AM

## 2012-04-09 NOTE — Progress Notes (Signed)
Subjective: 3 Days Post-Op Procedure(s) (LRB): TOTAL HIP ARTHROPLASTY (Right) Patient reports pain as 3 on 0-10 scale.    Objective: Vital signs in last 24 hours: Temp:  [99 F (37.2 C)-100 F (37.8 C)] 100 F (37.8 C) (11/24 1610) Pulse Rate:  [95-104] 95  (11/24 0632) Resp:  [7-23] 7  (11/24 1546) BP: (120-129)/(80-86) 129/86 mmHg (11/24 0632) SpO2:  [93 %-99 %] 99 % (11/24 1242) FiO2 (%):  [30 %-99 %] 30 % (11/24 0821)  Intake/Output from previous day: 11/23 0701 - 11/24 0700 In: 220 [P.O.:220] Out: 1700 [Urine:1700] Intake/Output this shift:     Basename 04/08/12 0725  HGB 10.9*    Basename 04/08/12 0725  WBC 11.3*  RBC 3.64*  HCT 30.5*  PLT 201   No results found for this basename: NA:2,K:2,CL:2,CO2:2,BUN:2,CREATININE:2,GLUCOSE:2,CALCIUM:2 in the last 72 hours  Basename 04/09/12 0628 04/08/12 0725  LABPT -- --  INR 1.46 1.49    Neurovascular intact Dorsiflexion/Plantar flexion intact  Assessment/Plan: 3 Days Post-Op Procedure(s) (LRB): TOTAL HIP ARTHROPLASTY (Right) Up with therapy D/C IV fluids Plan for discharge tomorrow Discharge home with home health  Kennieth Rad 04/09/2012, 4:39 PM

## 2012-04-09 NOTE — Progress Notes (Signed)
PT has already put cpap machine on himself at this time with his home mask. PT in no distress, comfortable. RT advised PT to call if he needs any more assistance. RT will continue to monitor.

## 2012-04-09 NOTE — Progress Notes (Signed)
Physical Therapy Treatment Patient Details Name: [name] MRN: [id number] DOB: July 20, 1950 Today's Date: 04/09/2012 Time: 7829-5621 PT Time Calculation (min): 26 min  PT Assessment / Plan / Recommendation Comments on Treatment Session  Improved SaO2 today with activity but continues to be anxious and requiring reinstruction on education performed yesterday.  Limited carry over from prior treatment.  Wife present during treatment today.    Follow Up Recommendations  Home health PT;Supervision/Assistance - 24 hour     Equipment Recommendations  3 in 1 bedside comode;Tub/shower bench;Rolling walker with 5" wheels    Recommendations for Other Services    Frequency 7X/week   Plan Discharge plan remains appropriate;Frequency remains appropriate    Precautions / Restrictions Precautions Precautions: Posterior Hip Precaution Comments: Reviewed hip precautions.  Unable to recall them initially Restrictions Weight Bearing Restrictions: Yes RLE Weight Bearing: Partial weight bearing RLE Partial Weight Bearing Percentage or Pounds: 50%   Pertinent Vitals/Pain Premedicated.  No c/o pain during treatment today.    Mobility  Bed Mobility Bed Mobility: Supine to Sit;Sitting - Scoot to Edge of Bed Supine to Sit: 3: Mod assist Sitting - Scoot to Edge of Bed: 3: Mod assist Details for Bed Mobility Assistance: Verbal cues for technique and precautions.  Needs assist at trunk and for L LE. Transfers Transfers: Sit to Stand;Stand to Sit Sit to Stand: 3: Mod assist;With upper extremity assist;From bed;From chair/3-in-1;With armrests Stand to Sit: 4: Min assist;With upper extremity assist;With armrests;To chair/3-in-1 Details for Transfer Assistance: Cues for safety and technique.  Required increased time to complete sit-stand and had to take 2nd attempt to achieve standing. Ambulation/Gait Ambulation/Gait Assistance: 1: +2 Total assist Ambulation/Gait: Patient Percentage: 70% Ambulation  Distance (Feet): 4 Feet (then 4 forward and backwards) Assistive device: Rolling walker Ambulation/Gait Assistance Details: Verbal cues for sequence, posture and breathing.  Continues with 4/4 DOE on O2.  Sats staying >85% today. Gait Pattern: Step-to pattern;Decreased step length - right;Decreased stance time - right;Trunk flexed Gait velocity: decreased     PT Goals Acute Rehab PT Goals PT Goal: Supine/Side to Sit - Progress: Progressing toward goal PT Goal: Sit to Supine/Side - Progress: Progressing toward goal PT Goal: Sit to Stand - Progress: Progressing toward goal PT Goal: Ambulate - Progress: Progressing toward goal Additional Goals PT Goal: Additional Goal #1 - Progress: Progressing toward goal  Visit Information  Last PT Received On: 04/09/12 Assistance Needed: +2    Subjective Data  Subjective: "I'll try to be more cooperative today."   Cognition  Overall Cognitive Status: Impaired Area of Impairment: Memory;Safety/judgement;Problem solving Arousal/Alertness: Awake/alert Orientation Level: Appears intact for tasks assessed Behavior During Session: [facility] for tasks performed Memory: Decreased recall of precautions Safety/Judgement: Decreased awareness of safety precautions;Decreased awareness of need for assistance Cognition - Other Comments: Unable to recall precautions, asking same questions as answered in earlier session.  Anxious.        End of Session PT - End of Session Equipment Utilized During Treatment: Gait belt;Oxygen Activity Tolerance: Patient limited by fatigue Patient left: in chair;with call bell/phone within reach;with family/visitor present Nurse Communication: Mobility status    Newell Coral 04/09/2012, 12:42 PM  Newell Coral, PTA Acute Rehab [phone] (office)

## 2012-04-09 NOTE — Progress Notes (Signed)
At the start of the shift pt was agitated and complained about the his C-Pap. Pt ask many questions about the IV pump, the medication and the monitoring system. We answered all his question but pt started yelling when he was unable to process the information. We supported pt and allowed him to vent. Subsequently pt verbalized understanding. We will continue to support, teach and provide explanation whenever it is needed.

## 2012-04-09 NOTE — Progress Notes (Signed)
Physical Therapy Treatment Note  04/09/12 1244  PT Visit Information  Last PT Received On 04/09/12  Assistance Needed +2  PT Time Calculation  PT Start Time 1130  PT Stop Time 1149  PT Time Calculation (min) 19 min  Subjective Data  Subjective "I'm ready to get back in bed but I can't figure this out."  Pt trying to reach to bed and put bedrail down.  Wife present.  Precautions  Precautions Posterior Hip  Restrictions  Weight Bearing Restrictions Yes  RLE Weight Bearing PWB  RLE Partial Weight Bearing Percentage or Pounds 50%  Cognition  Overall Cognitive Status Impaired  Area of Impairment Memory;Safety/judgement;Problem solving  Arousal/Alertness Awake/alert  Orientation Level Appears intact for tasks assessed  Behavior During Session [facility] for tasks performed  Memory Decreased recall of precautions  Safety/Judgement Decreased awareness of safety precautions;Decreased awareness of need for assistance  Cognition - Other Comments Unable to recall precautions, asking same questions as answered in earlier session.  Anxious.  Bed Mobility  Bed Mobility Sit to Supine  Sit to Supine 3: Mod assist;HOB flat;With rail  Details for Bed Mobility Assistance Needs assist at trunk and for L LE.  Cues for precautions.  Transfers  Transfers Sit to Stand;Stand to Sit  Sit to Stand 4: Min assist;With upper extremity assist;With armrests;From chair/3-in-1  Stand to Sit 4: Min assist;With upper extremity assist;To bed  Stand Pivot Transfers 4: Min assist  Details for Transfer Assistance Cues for safety and technique.  Required increased time to complete sit-stand and had to take 2nd attempt to achieve standing.  Exercises  Exercises Total Joint  Total Joint Exercises  Short Arc Quad AROM;Right;20 reps;Supine  Heel Slides AROM;Right;10 reps;Supine  Hip ABduction/ADduction AAROM;Right;10 reps;Supine  Straight Leg Raises AAROM;Right;10 reps;Supine  Long Arc [place];Right;15 reps;Seated  PT -  End of Session  Equipment Utilized During Treatment Gait belt;Oxygen  Activity Tolerance Patient limited by fatigue  Patient left in bed;with call bell/phone within reach;with family/visitor present  Nurse Communication Mobility status  PT - Assessment/Plan  Comments on Treatment Session Continues with DOE 4/4 and limited carry over.  May need post acute rehab if does not progress further soon.  Motivated and cooperative today.  PT Plan Discharge plan remains appropriate;Frequency remains appropriate  PT Frequency 7X/week  Follow Up Recommendations Home health PT;Supervision/Assistance - 24 hour;Other (comment) (May need to look at other options if does progress quickly)  Equipment Recommended 3 in 1 bedside comode;Tub/shower bench;Rolling walker with 5" wheels  Acute Rehab PT Goals  PT Goal: Sit to Supine/Side - Progress Progressing toward goal  PT Goal: Sit to Stand - Progress Progressing toward goal  PT Goal: Perform Home Exercise Program - Progress Progressing toward goal  Additional Goals  PT Goal: Additional Goal #1 - Progress Progressing toward goal  PT General Charges  $$ ACUTE PT VISIT 1 Procedure  PT Treatments  $Therapeutic Activity 8-22 mins   Modena Morrow, PTA Acute Rehab [phone]

## 2012-04-10 LAB — GLUCOSE, CAPILLARY: Glucose-Capillary: 124 mg/dL — ABNORMAL HIGH (ref 70–99)

## 2012-04-10 LAB — PROTIME-INR: INR: 1.62 — ABNORMAL HIGH (ref 0.00–1.49)

## 2012-04-10 MED ORDER — OXYCODONE HCL 5 MG PO TABS: 5.0000 mg | ORAL_TABLET | ORAL | Status: DC | PRN

## 2012-04-10 MED ORDER — WARFARIN SODIUM 7.5 MG PO TABS
7.5000 mg | ORAL_TABLET | Freq: Once | ORAL | Status: DC
  Filled 2012-04-10: qty 1

## 2012-04-10 MED ORDER — METHOCARBAMOL 500 MG PO TABS: 500.0000 mg | ORAL_TABLET | Freq: Four times a day (QID) | ORAL | Status: DC | PRN

## 2012-04-10 MED ORDER — WARFARIN - PHARMACIST DOSING INPATIENT: 5.0000 | Freq: Every day | Status: DC

## 2012-04-10 MED ORDER — WARFARIN SODIUM 1 MG PO TABS: 5.0000 mg | ORAL_TABLET | Freq: Every day | ORAL | Status: DC

## 2012-04-10 MED ORDER — FERROUS SULFATE 325 (65 FE) MG PO TABS: 325.0000 mg | ORAL_TABLET | Freq: Three times a day (TID) | ORAL | Status: DC

## 2012-04-10 MED ORDER — DSS 100 MG PO CAPS: 100.0000 mg | ORAL_CAPSULE | Freq: Two times a day (BID) | ORAL | Status: DC

## 2012-04-10 NOTE — Progress Notes (Signed)
Physical Therapy Treatment Patient Details Name: [name] MRN: [id number] DOB: February 08, 1951 Today's Date: 04/10/2012 Time: 3474-2595 PT Time Calculation (min): 22 min  PT Assessment / Plan / Recommendation Comments on Treatment Session  Pt exercised on 4L O2, O2 sats remained 93-95%, frequent vc's for breathing while exercising and relaxing neck musculature. Pt tolerated transfers well, did not ambulate at his request as he was getting ready to discharge and trying to conserve energy, PT agreed. D/C from acute PT, f/u with HHPT.    Follow Up Recommendations  Home health PT;Supervision/Assistance - 24 hour;Other (comment)     Does the patient have the potential to tolerate intense rehabilitation     Barriers to Discharge        Equipment Recommendations  3 in 1 bedside comode;Tub/shower bench;Rolling walker with 5" wheels    Recommendations for Other Services    Frequency 7X/week   Plan Discharge plan remains appropriate;Frequency remains appropriate    Precautions / Restrictions Precautions Precautions: Posterior Hip Precaution Comments: Pt able to recall 1/3 posterior hip precautions and educated on 3/3 precautions. Restrictions Weight Bearing Restrictions: Yes RLE Weight Bearing: Partial weight bearing RLE Partial Weight Bearing Percentage or Pounds: 50%   Pertinent Vitals/Pain O2 sats 93-95% on 4L O2 throughout treatment 7/10 right hip pain    Mobility  Bed Mobility Bed Mobility: Not assessed Transfers Transfers: Sit to Stand;Stand to Sit Sit to Stand: 4: Min assist;With upper extremity assist;From chair/3-in-1 Stand to Sit: 4: Min guard;To chair/3-in-1;With upper extremity assist Details for Transfer Assistance: let pt attempt sit to stand without physical assist 3x but leans left and is unable to translate hands from chair to RW. Was able to perform task with min A. Ambulation/Gait Ambulation/Gait Assistance: Not tested (comment) (did not perform to conserve  energy for d/c) Assistive device: Rolling walker Stairs: No Wheelchair Mobility Wheelchair Mobility: No    Exercises Total Joint Exercises Ankle Circles/Pumps: AROM;Right;10 reps;Seated Quad Sets: AROM;Right;10 reps;Seated Hip ABduction/ADduction: AAROM;Right;10 reps;Seated Straight Leg Raises: AAROM;10 reps;Right;Seated (3' ht) Long Arc Quad: AROM;10 reps;Right;Seated (with slow, controlled descent) Marching in Standing: AROM;Both;20 reps;Standing Other Exercises Other Exercises: trunk extension in standing for relief of back discomfort and numbness in legs   PT Diagnosis:    PT Problem List:   PT Treatment Interventions:     PT Goals Acute Rehab PT Goals PT Goal Formulation: With patient Time For Goal Achievement: 04/14/12 Potential to Achieve Goals: Good Pt will go Supine/Side to Sit: with min assist PT Goal: Supine/Side to Sit - Progress: Progressing toward goal Pt will go Sit to Supine/Side: with min assist PT Goal: Sit to Supine/Side - Progress: Progressing toward goal Pt will go Sit to Stand: with supervision PT Goal: Sit to Stand - Progress: Progressing toward goal Pt will Ambulate: 51 - 150 feet;with supervision;with rolling walker PT Goal: Ambulate - Progress: Progressing toward goal Pt will Go Up / Down Stairs: 3-5 stairs;with min assist;with least restrictive assistive device PT Goal: Up/Down Stairs - Progress: Met Pt will Perform Home Exercise Program: with min assist PT Goal: Perform Home Exercise Program - Progress: Progressing toward goal Additional Goals Additional Goal #1: [employer]  POSTERIOR HIP  PT Goal: Additional Goal #1 - Progress: Progressing toward goal  Visit Information  Last PT Received On: 04/10/12 Assistance Needed: +2    Subjective Data  Subjective: I just want to leave and I don't want to do too much and make it harder to get in my home when I get  there. Patient Stated Goal: go home   Cognition  Overall Cognitive Status: Appears  within functional limits for tasks assessed/performed Arousal/Alertness: Awake/alert Orientation Level: Appears intact for tasks assessed Behavior During Session: [facility] for tasks performed    Balance  Balance Balance Assessed: Yes Dynamic Standing Balance Dynamic Standing - Balance Support: Bilateral upper extremity supported;During functional activity Dynamic Standing - Level of Assistance: 5: Stand by assistance  End of Session PT - End of Session Equipment Utilized During Treatment: Gait belt;Oxygen Activity Tolerance: Patient tolerated treatment well Patient left: in chair;with call bell/phone within reach;with family/visitor present Nurse Communication: Other (comment) (O2 status)   GP   Lyanne Co, PT  Acute Rehab Services  [phone]   Lyanne Co 04/10/2012, 3:53 PM

## 2012-04-10 NOTE — Progress Notes (Addendum)
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  No Known Allergies  Patient Measurements: Height: 5' 8.9" (175 cm) Weight: 198 lb 10.2 oz (90.1 kg) IBW/kg (Calculated) : 70.47   Vital Signs: Temp: 99 F (37.2 C) (11/25 0703) Temp src: Oral (11/25 0703) BP: 121/80 mmHg (11/25 0703) Pulse Rate: 96  (11/25 0703)  Labs:  Basename 04/10/12 0840 04/09/12 0628 04/08/12 0725  HGB -- -- 10.9*  HCT -- -- 30.5*  PLT -- -- 201  APTT -- -- --  LABPROT 18.7* 17.3* 17.6*  INR 1.62* 1.46 1.49  HEPARINUNFRC -- -- --  CREATININE -- -- --  CKTOTAL -- -- --  CKMB -- -- --  TROPONINI -- -- --    Estimated Creatinine Clearance: 92.2 ml/min (by C-G formula based on Cr of 0.92).   Assessment: INR = 1.62 today in this 61 y.o. 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 Last CBC done 11/23 with  H/H 10.9/30.5, Plts 20K. No evidence of bleeding reported. Continues on plavix. Renal function stable from pre-op labs. UOP 0.7 ml/kg/hr so far today.   Goal of Therapy:  INR 2-3 Monitor platelets by anticoagulation protocol: Yes   Plan:  - Warfarin 7.5 mg x 1 today - Daily PT/INR - Monitor for bleeding  Noah Delaine, RPh Clinical Pharmacist Pager: [phone] 04/10/2012 11:44 AM

## 2012-04-10 NOTE — Progress Notes (Signed)
Physical Therapy Treatment Patient Details Name: [name] MRN: [id number] DOB: Oct 12, 1950 Today's Date: 04/10/2012 Time: 6440-3474 PT Time Calculation (min): 40 min  PT Assessment / Plan / Recommendation Comments on Treatment Session  Pt admitted s/p right THA and continues to progress with therapy. Pt with severe 4/4 DOE limiting all mobility. Spoke with RN, RT, and MD all about severe DOE. Recommended a medical consult to manage respiratory issues. Dr. Montez Morita to pursue as he sees fit. Able to ambulate a short distance this am and negotiate stairs. [place] increasing slowly, but limited by DOE.    Follow Up Recommendations  Home health PT;Supervision/Assistance - 24 hour;Other (comment)     Does the patient have the potential to tolerate intense rehabilitation     Barriers to Discharge        Equipment Recommendations  3 in 1 bedside comode;Tub/shower bench;Rolling walker with 5" wheels    Recommendations for Other Services    Frequency 7X/week   Plan Discharge plan remains appropriate;Frequency remains appropriate    Precautions / Restrictions Precautions Precautions: Posterior Hip Precaution Booklet Issued: No Precaution Comments: Pt able to recall 1/3 posterior hip precautions and educated on 3/3 precautions. Restrictions Weight Bearing Restrictions: Yes RLE Weight Bearing: Partial weight bearing RLE Partial Weight Bearing Percentage or Pounds: 50%   Pertinent Vitals/Pain None    Mobility  Bed Mobility Bed Mobility: Not assessed Transfers Transfers: Sit to Stand;Stand to Sit (2 trials.) Sit to Stand: 4: Min assist;With upper extremity assist;From bed;From chair/3-in-1 Stand to Sit: 4: Min guard;With upper extremity assist;To chair/3-in-1 Details for Transfer Assistance: Assist to pt to translate trunk anterior over BOS due to initial posterior lean with stance. Guarding for sitting due to balance. Cues for safest hand/right LE  placement. Ambulation/Gait Ambulation/Gait Assistance: 4: Min guard Ambulation Distance (Feet): 5 Feet Assistive device: Rolling walker Ambulation/Gait Assistance Details: Guarding for balance with cues for sequence and maintaining PWBing (50%) right LE. Distance limited by DOE (pt on 6 L O2 via [place] with RN, RT, and MD made aware of severe 4/4 DOE). Gait Pattern: Step-to pattern;Decreased step length - right;Decreased stance time - right;Trunk flexed Gait velocity: decreased Stairs: Yes Stairs Assistance: 4: Min assist Stairs Assistance Details (indicate cue type and reason): Assist to off weight right LE in order to maintain PWBing (50%) right LE. Cues for sequence. Stair Management Technique: Two rails;Step to pattern;Forwards Number of Stairs: 2  Wheelchair Mobility Wheelchair Mobility: No    Exercises Total Joint Exercises Ankle Circles/Pumps: AROM;Right;10 reps;Supine Quad Sets: AROM;Right;10 reps;Supine Heel Slides: AAROM;Right;10 reps;Supine   PT Diagnosis:    PT Problem List:   PT Treatment Interventions:     PT Goals Acute Rehab PT Goals PT Goal Formulation: With patient Time For Goal Achievement: 04/14/12 Potential to Achieve Goals: Good PT Goal: Sit to Stand - Progress: Progressing toward goal PT Goal: Ambulate - Progress: Progressing toward goal Pt will Go Up / Down Stairs: 3-5 stairs;with min assist;with least restrictive assistive device PT Goal: Up/Down Stairs - Progress: Goal set today PT Goal: Perform Home Exercise Program - Progress: Progressing toward goal  Visit Information  Last PT Received On: 04/10/12 Assistance Needed: +2    Subjective Data  Subjective: "I am just having trouble getting going." Patient Stated Goal: to get up and walk., go home   Cognition  Overall Cognitive Status: Appears within functional limits for tasks assessed/performed Arousal/Alertness: Awake/alert Orientation Level: Appears intact for tasks assessed Behavior During  Session: [facility] for tasks performed  Balance  Balance Balance Assessed: No  End of Session PT - End of Session Equipment Utilized During Treatment: Gait belt;Oxygen Activity Tolerance: Patient limited by fatigue;Other (comment) (Limited by severe 4/4 DOE.) Patient left: in chair;with call bell/phone within reach;with family/visitor present Nurse Communication: Mobility status   GP     Cephus Shelling 04/10/2012, 10:23 AM  04/10/2012 Cephus Shelling, PT, DPT [phone]

## 2012-04-10 NOTE — Progress Notes (Signed)
D/C instructions and scripts given. Pt to D/C home with wife.

## 2012-04-11 NOTE — Discharge Summary (Signed)
[name], [name] NO.:  [id number]  MEDICAL RECORD NO.:  [id number]  LOCATION:  [id number]                        FACILITY:  [facility]  PHYSICIAN:  Myrtie Neither, MD      DATE OF BIRTH:  [birth year]  DATE OF ADMISSION:  04/06/2012 DATE OF DISCHARGE:  04/10/2012                              DISCHARGE SUMMARY   ADMITTING DIAGNOSES:  Degenerative joint disease, right hip, chronic obstructive pulmonary disease, history of hypertension, degenerative joint disease bilateral hip, obstructive sleep apnea, coronary artery disease, myocardial infarction, diabetes mellitus, anxiety, hyperlipidemia.  DISCHARGE DIAGNOSES:  Degenerative joint disease, right hip, chronic obstructive pulmonary disease, history of hypertension, degenerative joint disease bilateral hip, obstructive sleep apnea, coronary artery disease, myocardial infarction, diabetes mellitus, anxiety, hyperlipidemia.  COMPLICATIONS:  None.  INFECTIONS:  None.  OPERATION:  Right total hip arthroplasty.  PERTINENT HISTORY:  This is a 61 year old male followed for degenerative joint disease, bilateral hips, right worse than left, treated with anti- inflammatories and pain medications and use of cane, progressive worsening loss of function as a result of pain and dysfunction of the right hip.  Pertinent physical was that of the right hip, anterior and lateral pain on hip extension as well as on hip rotation.  Antalgic gait.  Obvious limp on the right side.  X-ray revealed loss of joint space, sclerosis, and osteophytes, right hip joint.  HOSPITAL COURSE:  The patient underwent preop medical evaluation as well as preop lab, CBC, EKG chest x-ray, PT, PTT, INR, CMET, UA.  [name] labs were stable enough to undergo surgery.  The patient underwent right total hip arthroplasty.  Postoperative course had been fairly benign, then drop in his O2 secondary to his COPD.  The patient has had to be on 4.5 L of nasal  oxygen where normally at home he is on 2.  At the hospital, the patient's O2 saturation 93%.  We have contacted the patient's medical doctor today and the patient is recommended to increase his home nasal O2 up to 4 L.  The patient will have home health, physical therapy arranged, partial weightbearing 50% on the right leg, daily dressing changes.  Continue Coumadin daily and weekly INRs.  Changes in medications, the patient is instructed not to use his Naprosyn and will be discharged with Percocet 1-2 q.4 p.r.n. for pain, ferrous sulfate 325 mg t.i.d., Colace 100 mg b.i.d., Coumadin 5 mg daily, Robaxin 500 mg b.i.d.  The patient to return to the office on April 17, 2012.  The patient is being discharged in stable and satisfactory condition.     Myrtie Neither, MD     AC/MEDQ  D:  04/10/2012  T:  04/11/2012  Job:  [phone]

## 2012-05-29 ENCOUNTER — Ambulatory Visit: Payer: Medicare Other | Attending: Orthopedic Surgery | Admitting: Physical Therapy

## 2012-06-14 ENCOUNTER — Ambulatory Visit (HOSPITAL_COMMUNITY): Payer: Self-pay | Admitting: Psychiatry

## 2012-07-04 ENCOUNTER — Encounter (HOSPITAL_COMMUNITY): Payer: Self-pay | Admitting: Psychiatry

## 2012-07-04 ENCOUNTER — Ambulatory Visit (INDEPENDENT_AMBULATORY_CARE_PROVIDER_SITE_OTHER): Payer: 59 | Admitting: Psychiatry

## 2012-07-04 VITALS — BP 154/96 | HR 98 | Wt 189.6 lb

## 2012-07-04 DIAGNOSIS — F329 Major depressive disorder, single episode, unspecified: Secondary | ICD-10-CM

## 2012-07-04 DIAGNOSIS — F1921 Other psychoactive substance dependence, in remission: Secondary | ICD-10-CM

## 2012-07-04 MED ORDER — MIRTAZAPINE 30 MG PO TABS: 30.0000 mg | ORAL_TABLET | Freq: Every day | ORAL | Status: DC

## 2012-07-04 MED ORDER — MIRTAZAPINE 15 MG PO TABS: 15.0000 mg | ORAL_TABLET | Freq: Every day | ORAL | Status: DC

## 2012-07-04 NOTE — Progress Notes (Signed)
[facility] [id number] Progress Note  [name] [id number] 62 y.o.  07/04/2012 2:48 PM  Chief Complaint:  I have a lot of pain.  I have surgery in November.    History of Present Illness:  Patient is 62 year old African American married man who came for his followup appointment.  He has hip surgery in November .  He is slowly recovering from his surgery.  He has a lot of pain and recently his doctor switch from her record want to oxycodone but he does not feel any improvement.  He sleep on and off.  He admitted taken Remeron few times 2 tablet but that did not help his insomnia.  Overall he feel less depressed but he is concerned about his pain and insomnia do to pain.  He scheduled to have left hip surgery in May or June .  He has difficulty walking due to shortness of breath and persistent pain in his leg.  He has lost some weight but he denies any crying spells agitation anger mood swing.  He's not drinking or using any illegal substance.  He likes his Remeron which is helping his anxiety mood and irritability.  I review his medication list.  Patient is showing Remeron 15 mg but actually patient supposed to take 30 mg.  It is unclear why his dose was reduced.  Patient has no knowledge about it.  Patient denies any tremors or shakes.  Suicidal Ideation: No Plan Formed: No Patient has means to carry out plan: No  Homicidal Ideation: No Plan Formed: No Patient has means to carry out plan: No  Review of Systems: Psychiatric: Agitation: No Hallucination: No Depressed Mood: No Insomnia: Yes Hypersomnia: No Altered Concentration: No Feels Worthless: No Grandiose Ideas: No Belief In Special Powers: No New/Increased Substance Abuse: No Compulsions: No  Neurologic: Headache: Yes Seizure: No Paresthesias: No  Past psychiatric history Patient has history of depression since 1990.  He has one suicidal attempt .  In the past he had tried Zoloft Paxil Prozac and Wellbutrin  but he stopped due to side effects.  Patient has significant history of using cocaine marijuana and alcohol for 20 years.  He claims to be sober since she's been coming to this office.    Medical history Patient has history of hypertension, sleep apnea, BPH , COPD, diabetes, hyperlipidemia, coronary artery disease status post stent and arthritis.  He scheduled to have right hip replacement on November 21.  His primary care physician is Dr. Delila Spence.  Social History: Patient was born and raised in [place].  His been living in [place] since 1986.  Patient has 4 children who lives out of town.  He's been married for 2 years.  He has a very supportive wife.  His wife supervises his medication.    Outpatient Encounter Prescriptions as of 07/04/2012  Medication Sig Dispense Refill  . amLODipine (NORVASC) 5 MG tablet Take 5 mg by mouth at bedtime.       Marland Kitchen atenolol (TENORMIN) 25 MG tablet Take 25 mg by mouth at bedtime.       . clopidogrel (PLAVIX) 75 MG tablet Take 75 mg by mouth daily.      . Coenzyme Q10 (COQ10) 100 MG CAPS Take 1 capsule by mouth at bedtime.      . Cyanocobalamin (VITAMIN B-12) 1000 MCG SUBL Place 1 tablet under the tongue See admin instructions. 4 times monthly; Days are picked at random      . docusate sodium  100 MG CAPS Take 100 mg by mouth 2 (two) times daily.  60 capsule  0  . fish oil-omega-3 fatty acids 1000 MG capsule Take 1 g by mouth at bedtime.      . metFORMIN (GLUCOPHAGE) 500 MG tablet Take 500 mg by mouth 2 (two) times daily with a meal.       . methocarbamol (ROBAXIN) 500 MG tablet Take 1 tablet (500 mg total) by mouth every 6 (six) hours as needed.  60 tablet  1  . mirtazapine (REMERON) 15 MG tablet Take 1 tablet (15 mg total) by mouth at bedtime.  30 tablet  2  . Multiple Vitamin (MULTIVITAMIN WITH MINERALS) TABS Take 1 tablet by mouth at bedtime.      . naphazoline-pheniramine (NAPHCON-A) 0.025-0.3 % ophthalmic solution Place 1 drop into both eyes 4 (four) times  daily as needed. For dry eyes      . oxyCODONE-acetaminophen (PERCOCET/ROXICET) 5-325 MG per tablet       . pravastatin (PRAVACHOL) 40 MG tablet Take 80 mg by mouth at bedtime.       . tadalafil (CIALIS) 20 MG tablet Take 20 mg by mouth daily as needed. For erectile dysfunction      . tiotropium (SPIRIVA) 18 MCG inhalation capsule Place 18 mcg into inhaler and inhale every morning.      . [DISCONTINUED] mirtazapine (REMERON) 15 MG tablet Take 15 mg by mouth at bedtime.      . ferrous sulfate 325 (65 FE) MG tablet Take 1 tablet (325 mg total) by mouth 3 (three) times daily after meals.  60 tablet  1  . nitroGLYCERIN (NITROSTAT) 0.4 MG SL tablet Place 0.4 mg under the tongue every 5 (five) minutes as needed. For chest pain      . [DISCONTINUED] aspirin 81 MG chewable tablet Chew 81 mg by mouth at bedtime.      . [DISCONTINUED] HYDROcodone-acetaminophen (NORCO/VICODIN) 5-325 MG per tablet Take 1 tablet by mouth every 4 (four) hours as needed. For pain      . [DISCONTINUED] oxyCODONE (OXY IR/ROXICODONE) 5 MG immediate release tablet Take 1-2 tablets (5-10 mg total) by mouth every 3 (three) hours as needed.  60 tablet  0  . [DISCONTINUED] warfarin (COUMADIN) 1 MG tablet Take 5 tablets (5 mg total) by mouth daily.  30 tablet  0  . [DISCONTINUED] Warfarin - Pharmacist Dosing Inpatient MISC 5 each by Does not apply route daily at 6 PM.  30 each  0   No facility-administered encounter medications on file as of 07/04/2012.    Past Psychiatric History/Hospitalization(s): Anxiety: Yes Bipolar Disorder: No Depression: No Mania: No Psychosis: No Schizophrenia: No Personality Disorder: No Hospitalization for psychiatric illness: Yes History of Electroconvulsive Shock Therapy: No Prior Suicide Attempts: Yes  Physical Exam: Constitutional:  BP 154/96  Pulse 98  Wt 189 lb 9.6 oz (86.002 kg)  BMI 28.08 kg/m2  General Appearance: Patient appears tired, shortness of breath and difficulty walking.  He  has lost weight from the past.,  He uses oxygen  Musculoskeletal: Strength & Muscle Tone: within normal limits Gait & Station: unsteady due to pain Patient leans: Front Review of Systems  Constitutional: Positive for malaise/fatigue.  Respiratory: Positive for shortness of breath.   Neurological: Positive for weakness.  Psychiatric/Behavioral: The patient is nervous/anxious and has insomnia.      Psychiatric: Speech (describe rate, volume, coherence, spontaneity, and abnormalities if any): Soft clear and coherent with normal tone volume  Thought Process (describe rate,  content, abstract reasoning, and computation): Logical and goal-directed  Associations: Coherent, Relevant and Intact  Thoughts: normal  Mental Status: Orientation: oriented to person, place, time/date and situation Mood & Affect: anxiety Attention Span & Concentration: Fair  Medical Decision Making (Choose Three): Review of Psycho-Social Stressors (1), Review or order clinical lab tests (1), Established Problem, Worsening (2), New Problem, with no additional work-up planned (3), Review of Last Therapy Session (1), Review of Medication Regimen & Side Effects (2) and Review of New Medication or Change in Dosage (2)  Assessment: Axis I: Maj. depressive disorder, polysubstance dependence in complete remission  Axis II: Deferred  Axis III: See medical history  Axis IV: Mild to moderate  Axis V: 60-65   Plan: I review his medical history and current medication.  Patient has been more anxious than usual which could be due to to taking reduced dose of Remeron.  I would increase his Remeron to 30 mg which he was taking in the past.  Risk and benefit explain in detail.  I recommend to call us if his any question or concern or if he feel worsening of the symptom.  I review his discharge summary from the hospital, recent blood work and reassurance given.  I also recommend to discuss with his pain Dr. if the medication  can be switched to help his pain.  Time spent 30 minutes.  I will see him again in 3 months. More than 50% of the time spent in counseling, psychoeducation and coordination of care.  Rylin Saez T., MD 07/04/2012

## 2012-10-03 ENCOUNTER — Encounter (HOSPITAL_COMMUNITY): Payer: Self-pay | Admitting: Psychiatry

## 2012-10-03 ENCOUNTER — Ambulatory Visit (INDEPENDENT_AMBULATORY_CARE_PROVIDER_SITE_OTHER): Payer: 59 | Admitting: Psychiatry

## 2012-10-03 VITALS — BP 108/78 | HR 96 | Ht 70.0 in

## 2012-10-03 DIAGNOSIS — F329 Major depressive disorder, single episode, unspecified: Secondary | ICD-10-CM

## 2012-10-03 DIAGNOSIS — F1921 Other psychoactive substance dependence, in remission: Secondary | ICD-10-CM

## 2012-10-03 MED ORDER — MIRTAZAPINE 30 MG PO TABS: 30.0000 mg | ORAL_TABLET | Freq: Every day | ORAL | Status: DC

## 2012-10-03 NOTE — Progress Notes (Signed)
[facility] [id number] Progress Note  [name] [id number] 62 y.o.  10/03/2012 2:57 PM  Chief Complaint:  Medication management and followup.      History of Present Illness:  Patient is 62 year old African American married man who came for his followup appointment.  He is compliant with his medication and denies and side effects.  He is less anxious and less depressed since Remeron was increased to 30 mg on his last visit.  He is still has trouble sleeping but he denies any crying spells or any social isolation.  He started to cut down his pain medication.  He is taking hydrocodone only as needed.  He is taking more Naprosyn for his pain.  His upcoming surgery is postponed for another few months.  He recently seen his primary care physician and he was told that he has low testosterone.  Overall he's been doing good.  He's concerned about his wife who goes to [facility] and now she was told that she no longer see there.  Patient is wondering if the wife can be seen in this office.  Patient is not drinking or using any illegal substance.  He denies any crying spells agitation anger or mood swing.  He had a tremors or shakes.  He has generalized weakness and difficulty breathing with the feet his antidepressant is working.  Suicidal Ideation: No Plan Formed: No Patient has means to carry out plan: No  Homicidal Ideation: No Plan Formed: No Patient has means to carry out plan: No  Review of Systems: Psychiatric: Agitation: No Hallucination: No Depressed Mood: No Insomnia: Yes Hypersomnia: No Altered Concentration: No Feels Worthless: No Grandiose Ideas: No Belief In Special Powers: No New/Increased Substance Abuse: No Compulsions: No  Neurologic: Headache: Yes Seizure: No Paresthesias: No  Past psychiatric history Patient has history of depression since 1990.  He has one suicidal attempt .  In the past he had tried Zoloft Paxil Prozac and Wellbutrin but he stopped due  to side effects.  Patient has significant history of using cocaine marijuana and alcohol for 20 years.  He claims to be sober since she's been coming to this office.    Medical history Patient has history of hypertension, sleep apnea, BPH , COPD, diabetes, hyperlipidemia, coronary artery disease status post stent and arthritis.  He scheduled to have right hip replacement on November 21.  His primary care physician is Dr. Delila Spence.  Social History: Patient was born and raised in [place].  His been living in [place] since 1986.  Patient has 4 children who lives out of town.  He's been married for 2 years.  He has a very supportive wife.  His wife supervises his medication.    Outpatient Encounter Prescriptions as of 10/03/2012  Medication Sig Dispense Refill  . amLODipine (NORVASC) 5 MG tablet Take 5 mg by mouth at bedtime.       Marland Kitchen atenolol (TENORMIN) 25 MG tablet Take 25 mg by mouth at bedtime.       . clopidogrel (PLAVIX) 75 MG tablet Take 75 mg by mouth daily.      . Coenzyme Q10 (COQ10) 100 MG CAPS Take 1 capsule by mouth at bedtime.      . Cyanocobalamin (VITAMIN B-12) 1000 MCG SUBL Place 1 tablet under the tongue See admin instructions. 4 times monthly; Days are picked at random      . docusate sodium 100 MG CAPS Take 100 mg by mouth 2 (two) times daily.  60  capsule  0  . ferrous sulfate 325 (65 FE) MG tablet Take 1 tablet (325 mg total) by mouth 3 (three) times daily after meals.  60 tablet  1  . fish oil-omega-3 fatty acids 1000 MG capsule Take 1 g by mouth at bedtime.      Marland Kitchen HYDROcodone-acetaminophen (NORCO) 10-325 MG per tablet       . metFORMIN (GLUCOPHAGE) 500 MG tablet Take 500 mg by mouth 2 (two) times daily with a meal.       . methocarbamol (ROBAXIN) 500 MG tablet Take 1 tablet (500 mg total) by mouth every 6 (six) hours as needed.  60 tablet  1  . mirtazapine (REMERON) 30 MG tablet Take 1 tablet (30 mg total) by mouth at bedtime.  30 tablet  2  . Multiple Vitamin (MULTIVITAMIN  WITH MINERALS) TABS Take 1 tablet by mouth at bedtime.      . naphazoline-pheniramine (NAPHCON-A) 0.025-0.3 % ophthalmic solution Place 1 drop into both eyes 4 (four) times daily as needed. For dry eyes      . pravastatin (PRAVACHOL) 40 MG tablet Take 80 mg by mouth at bedtime.       . tadalafil (CIALIS) 20 MG tablet Take 20 mg by mouth daily as needed. For erectile dysfunction      . tiotropium (SPIRIVA) 18 MCG inhalation capsule Place 18 mcg into inhaler and inhale every morning.      . [DISCONTINUED] mirtazapine (REMERON) 30 MG tablet Take 1 tablet (30 mg total) by mouth at bedtime.  30 tablet  2  . [DISCONTINUED] nitroGLYCERIN (NITROSTAT) 0.4 MG SL tablet Place 0.4 mg under the tongue every 5 (five) minutes as needed. For chest pain      . [DISCONTINUED] oxyCODONE-acetaminophen (PERCOCET/ROXICET) 5-325 MG per tablet        No facility-administered encounter medications on file as of 10/03/2012.    Past Psychiatric History/Hospitalization(s): Anxiety: Yes Bipolar Disorder: No Depression: No Mania: No Psychosis: No Schizophrenia: No Personality Disorder: No Hospitalization for psychiatric illness: Yes History of Electroconvulsive Shock Therapy: No Prior Suicide Attempts: Yes  Physical Exam: Constitutional:  BP 108/78  Pulse 96  Ht 5\' 10"  (1.778 m)  General Appearance: Patient appears tired, shortness of breath and difficulty walking.  He has lost weight from the past.,  He uses oxygen  Musculoskeletal: Strength & Muscle Tone: within normal limits Gait & Station: unsteady due to pain Patient leans: Front Review of Systems  Constitutional: Positive for malaise/fatigue.  Respiratory: Positive for shortness of breath.   Neurological: Positive for weakness.  Psychiatric/Behavioral: The patient is nervous/anxious and has insomnia.      Psychiatric: Speech (describe rate, volume, coherence, spontaneity, and abnormalities if any): Soft clear and coherent with normal tone  volume  Thought Process (describe rate, content, abstract reasoning, and computation): Logical and goal-directed  Associations: Coherent, Relevant and Intact  Thoughts: normal  Mental Status: Orientation: oriented to person, place, time/date and situation Mood & Affect: anxiety Attention Span & Concentration: Fair  Medical Decision Making (Choose Three): Established Problem, Stable/Improving (1), Review of Psycho-Social Stressors (1), Review of Last Therapy Session (1) and Review of New Medication or Change in Dosage (2)  Assessment: Axis I: Maj. depressive disorder, polysubstance dependence in complete remission  Axis II: Deferred  Axis III: See medical history  Axis IV: Mild to moderate  Axis V: 60-65   Plan:  I will continue Remeron 30 mg at bedtime.  I asked patient to have her wife record faxed to  [place] from [place] so we can schedule an appointment with another provider.  I recommend to cause back it is a question of conservatively worsening of the symptom.  I will see him again in 3 months.  ARFEEN,SYED T., MD 10/03/2012

## 2013-01-03 ENCOUNTER — Ambulatory Visit (HOSPITAL_COMMUNITY): Payer: Self-pay | Admitting: Psychiatry

## 2013-01-11 ENCOUNTER — Encounter (HOSPITAL_COMMUNITY): Payer: Self-pay | Admitting: Psychiatry

## 2013-01-11 ENCOUNTER — Ambulatory Visit (INDEPENDENT_AMBULATORY_CARE_PROVIDER_SITE_OTHER): Payer: 59 | Admitting: Psychiatry

## 2013-01-11 VITALS — BP 134/94 | HR 103 | Ht 70.0 in | Wt 184.0 lb

## 2013-01-11 DIAGNOSIS — F329 Major depressive disorder, single episode, unspecified: Secondary | ICD-10-CM

## 2013-01-11 DIAGNOSIS — F1921 Other psychoactive substance dependence, in remission: Secondary | ICD-10-CM

## 2013-01-11 MED ORDER — MIRTAZAPINE 30 MG PO TABS: 30.0000 mg | ORAL_TABLET | Freq: Every day | ORAL | Status: DC

## 2013-01-11 NOTE — Progress Notes (Signed)
[facility] [id number] Progress Note  [name] [id number] 62 y.o.  01/11/2013 1:37 PM  Chief Complaint:  Medication management and followup.      History of Present Illness:  Patient is 62 year old African American married man who came for his followup appointment.  He is compliant with his medication and denies and side effects.  He is disappointed because his surgery was rescheduled in a November .  Overall he is less anxious less depressed.  He likes his medication.  His wife is also seen in this office.  He is taking testosterone from his primary care physician.  Patient denies any crying spells or any nervousness.  He is sleeping better.  He was to continue his current psychiatric medication.  Suicidal Ideation: No Plan Formed: No Patient has means to carry out plan: No  Homicidal Ideation: No Plan Formed: No Patient has means to carry out plan: No  Review of Systems: Psychiatric: Agitation: No Hallucination: No Depressed Mood: No Insomnia: No Hypersomnia: No Altered Concentration: No Feels Worthless: No Grandiose Ideas: No Belief In Special Powers: No New/Increased Substance Abuse: No Compulsions: No  Neurologic: Headache: Yes Seizure: No Paresthesias: No  Past psychiatric history Patient has history of depression since 1990.  He has one suicidal attempt .  In the past he had tried Zoloft Paxil Prozac and Wellbutrin but he stopped due to side effects.  Patient has significant history of using cocaine marijuana and alcohol for 20 years.  He claims to be sober since she's been coming to this office.    Medical history Patient has history of hypertension, sleep apnea, BPH , COPD, diabetes, hyperlipidemia, coronary artery disease status post stent and arthritis.  He scheduled to have right hip replacement on November 21.  His primary care physician is Dr. Delila Spence.  Social History: Patient was born and raised in [place].  His been living in [place]  since 1986.  Patient has 4 children who lives out of town.  He's been married for 2 years.  He has a very supportive wife.  His wife supervises his medication.    Outpatient Encounter Prescriptions as of 01/11/2013  Medication Sig Dispense Refill  . amLODipine (NORVASC) 5 MG tablet Take 5 mg by mouth at bedtime.       . ANDROGEL PUMP 20.25 MG/ACT (1.62%) GEL       . atenolol (TENORMIN) 25 MG tablet Take 25 mg by mouth at bedtime.       . clopidogrel (PLAVIX) 75 MG tablet Take 75 mg by mouth daily.      . Coenzyme Q10 (COQ10) 100 MG CAPS Take 1 capsule by mouth at bedtime.      . Cyanocobalamin (VITAMIN B-12) 1000 MCG SUBL Place 1 tablet under the tongue See admin instructions. 4 times monthly; Days are picked at random      . docusate sodium 100 MG CAPS Take 100 mg by mouth 2 (two) times daily.  60 capsule  0  . ferrous sulfate 325 (65 FE) MG tablet Take 1 tablet (325 mg total) by mouth 3 (three) times daily after meals.  60 tablet  1  . fish oil-omega-3 fatty acids 1000 MG capsule Take 1 g by mouth at bedtime.      Marland Kitchen HYDROcodone-acetaminophen (NORCO) 10-325 MG per tablet       . metFORMIN (GLUCOPHAGE) 500 MG tablet Take 500 mg by mouth 2 (two) times daily with a meal.       . methocarbamol (  ROBAXIN) 500 MG tablet Take 1 tablet (500 mg total) by mouth every 6 (six) hours as needed.  60 tablet  1  . mirtazapine (REMERON) 30 MG tablet Take 1 tablet (30 mg total) by mouth at bedtime.  30 tablet  2  . Multiple Vitamin (MULTIVITAMIN WITH MINERALS) TABS Take 1 tablet by mouth at bedtime.      . naphazoline-pheniramine (NAPHCON-A) 0.025-0.3 % ophthalmic solution Place 1 drop into both eyes 4 (four) times daily as needed. For dry eyes      . pravastatin (PRAVACHOL) 40 MG tablet Take 80 mg by mouth at bedtime.       . tadalafil (CIALIS) 20 MG tablet Take 20 mg by mouth daily as needed. For erectile dysfunction      . tiotropium (SPIRIVA) 18 MCG inhalation capsule Place 18 mcg into inhaler and inhale every  morning.      . [DISCONTINUED] mirtazapine (REMERON) 30 MG tablet Take 1 tablet (30 mg total) by mouth at bedtime.  30 tablet  2   No facility-administered encounter medications on file as of 01/11/2013.    Past Psychiatric History/Hospitalization(s): Anxiety: Yes Bipolar Disorder: No Depression: No Mania: No Psychosis: No Schizophrenia: No Personality Disorder: No Hospitalization for psychiatric illness: Yes History of Electroconvulsive Shock Therapy: No Prior Suicide Attempts: Yes  Physical Exam: Constitutional:  BP 134/94  Pulse 103  Ht 5\' 10"  (1.778 m)  Wt 184 lb (83.462 kg)  BMI 26.4 kg/m2  General Appearance: Patient appears tired, shortness of breath and difficulty walking.  He has lost weight from the past.,  He uses oxygen  Musculoskeletal: Strength & Muscle Tone: within normal limits Gait & Station: unsteady due to pain Patient leans: Front Review of Systems  Constitutional: Positive for malaise/fatigue.  Respiratory: Positive for shortness of breath.   Neurological: Positive for weakness.  Psychiatric/Behavioral: The patient is nervous/anxious and has insomnia.      Psychiatric: Speech (describe rate, volume, coherence, spontaneity, and abnormalities if any): Soft clear and coherent with normal tone volume  Thought Process (describe rate, content, abstract reasoning, and computation): Logical and goal-directed  Associations: Coherent, Relevant and Intact  Thoughts: normal  Mental Status: Orientation: oriented to person, place, time/date and situation Mood & Affect: anxiety Attention Span & Concentration: Fair  Medical Decision Making (Choose Three): Established Problem, Stable/Improving (1), Review of Psycho-Social Stressors (1), Review of Last Therapy Session (1) and Review of New Medication or Change in Dosage (2)  Assessment: Axis I: Maj. depressive disorder, polysubstance dependence in complete remission  Axis II: Deferred  Axis III: See  medical history  Axis IV: Mild to moderate  Axis V: 60-65   Plan:  I will continue Remeron 30 mg at bedtime. I recommend to call us back if he has any question or concerns. I will see him again in 3 months.  Blakeleigh Domek T., MD 01/11/2013

## 2013-03-28 ENCOUNTER — Other Ambulatory Visit: Payer: Self-pay | Admitting: Orthopedic Surgery

## 2013-03-28 ENCOUNTER — Ambulatory Visit
Admission: RE | Admit: 2013-03-28 | Discharge: 2013-03-28 | Disposition: A | Discharge status: Home / Self Care | Payer: Medicare Other | Source: Ambulatory Visit | Attending: Orthopedic Surgery | Admitting: Orthopedic Surgery

## 2013-03-28 DIAGNOSIS — R52 Pain, unspecified: Secondary | ICD-10-CM

## 2013-03-29 ENCOUNTER — Encounter (HOSPITAL_COMMUNITY): Payer: Self-pay | Admitting: Pharmacy Technician

## 2013-03-30 ENCOUNTER — Encounter (HOSPITAL_COMMUNITY): Payer: Self-pay

## 2013-03-30 ENCOUNTER — Encounter (HOSPITAL_COMMUNITY)
Admission: RE | Admit: 2013-03-30 | Discharge: 2013-03-30 | Disposition: A | Discharge status: Home / Self Care | Payer: Medicare Other | Source: Ambulatory Visit | Attending: Anesthesiology | Admitting: Anesthesiology

## 2013-03-30 ENCOUNTER — Encounter (HOSPITAL_COMMUNITY)
Admission: RE | Admit: 2013-03-30 | Discharge: 2013-03-30 | Disposition: A | Discharge status: Home / Self Care | Payer: Medicare Other | Source: Ambulatory Visit | Attending: Orthopedic Surgery | Admitting: Orthopedic Surgery

## 2013-03-30 DIAGNOSIS — Z01812 Encounter for preprocedural laboratory examination: Secondary | ICD-10-CM | POA: Insufficient documentation

## 2013-03-30 DIAGNOSIS — Z01818 Encounter for other preprocedural examination: Secondary | ICD-10-CM | POA: Insufficient documentation

## 2013-03-30 DIAGNOSIS — Z01810 Encounter for preprocedural cardiovascular examination: Secondary | ICD-10-CM | POA: Insufficient documentation

## 2013-03-30 HISTORY — DX: Depression, unspecified: F32.A

## 2013-03-30 HISTORY — DX: Dependence on supplemental oxygen: Z99.81

## 2013-03-30 HISTORY — DX: Gastro-esophageal reflux disease without esophagitis: K21.9

## 2013-03-30 HISTORY — DX: Major depressive disorder, single episode, unspecified: F32.9

## 2013-03-30 LAB — BASIC METABOLIC PANEL
CO2: 24 mEq/L (ref 19–32)
Calcium: 10 mg/dL (ref 8.4–10.5)
Chloride: 105 mEq/L (ref 96–112)
Creatinine, Ser: 0.78 mg/dL (ref 0.50–1.35)
GFR calc non Af Amer: >90 mL/min (ref >90)
Glucose, Bld: 101 mg/dL — ABNORMAL HIGH (ref 70–99)
Sodium: 142 mEq/L (ref 135–145)

## 2013-03-30 LAB — CBC
HCT: 44.5 % (ref 39.0–52.0)
MCH: 29.9 pg (ref 26.0–34.0)
MCHC: 36.2 g/dL — ABNORMAL HIGH (ref 30.0–36.0)
MCV: 82.6 fL (ref 78.0–100.0)
Platelets: 277 10*3/uL (ref 150–400)
RBC: 5.39 MIL/uL (ref 4.22–5.81)
WBC: 7.6 10*3/uL (ref 4.0–10.5)

## 2013-03-30 LAB — TYPE AND SCREEN: ABO/RH(D): O POS

## 2013-03-30 LAB — APTT: aPTT: 32 seconds (ref 24–37)

## 2013-03-30 LAB — PROTIME-INR: INR: 1.02 (ref 0.00–1.49)

## 2013-03-30 NOTE — Pre-Procedure Instructions (Signed)
LAWAYNE HARTIG  03/30/2013   Your procedure is scheduled on:  Tuesday, November 18th  Report to Main Entrance "A" and check in with admitting at 0530 AM.  Call this number if you have problems the morning of surgery: [phone]   Remember:   Do not eat food or drink liquids after midnight.   Take these medicines the morning of surgery with A SIP OF WATER: spiriva, atenolol, norvasc, vicodin if needed  Stop taking over the counter vitamins/herbal medications, aspirin, NSAIDS (ibuprofen, advil) as of today.   Do not wear jewelry.  Do not wear lotions, powders, or perfumes. You may wear deodorant.  Do not shave 48 hours prior to surgery. Men may shave face and neck.  Do not bring valuables to the hospital.  [facility] is not responsible   for any belongings or valuables.               Contacts, dentures or bridgework may not be worn into surgery.  Leave suitcase in the car. After surgery it may be brought to your room.  For patients admitted to the hospital, discharge time is determined by your   treatment team.               Patients discharged the day of surgery will not be allowed to drive home.    Special Instructions: Shower using CHG 2 nights before surgery and the night before surgery.  If you shower the day of surgery use CHG.  Use special wash - you have one bottle of CHG for all showers.  You should use approximately 1/3 of the bottle for each shower.   Please read over the following fact sheets that you were given: Pain Booklet, Coughing and Deep Breathing, Blood Transfusion Information, MRSA Information and Surgical Site Infection Prevention

## 2013-03-30 NOTE — Progress Notes (Signed)
Primary - dr. Parke Simmers Cardiologist - dr. Donnie Aho Did see Donnie Aho last week - will request records

## 2013-04-02 NOTE — Progress Notes (Signed)
Anesthesia Chart Review: Patient is a 62 year old male scheduled for left total hip arthroplasty on 04/03/13 by Dr. Montez Morita. Orders were pending at the time of his PAT.   History includes smoking, COPD, anxiety, arthritis, hypertension, obstructive sleep apnea, diabetes mellitus type 2, hyperlipidemia, CAD/lateral wall MI s/p OM and LAD stents '02 ([place], [place]), OM1 ([facility]) and CX ([facility]) '03, and LAD ([facility]) 06/27/08. He is s/p right THA 04/06/12.   His Cardiologist is Dr. Donnie Aho. He was recently seen on 03/16/13 for a preoperative evaluation. He was felt acceptable risk, and one year follow-up was recommended.  EKG on 03/30/13 showed NSR, rightward axis, left atrial hypertrophy, possible lateral infarct.  It was felt stable when compared to prior EKG.  Echo on 12/09/08 showed:  Left ventricle: The cavity size was mildly dilated. Wall thickness was increased in a pattern of moderate LVH. Systolic function was mildly to moderately reduced. The estimated ejection fraction was in the range of 40% to 45%. Severe hypokinesis of the inferolateral myocardium. Doppler parameters are consistent with abnormal left ventricular relaxation (grade 1 diastolic dysfunction).  Cardiac cath on 06/26/08 showed:  1. Significant coronary artery disease with a severe ostial stenosis involving the diagonal branch, diffuse in-stent restenosis to left anterior descending coronary artery, uncertain of significance, widely patent stents in the first marginal and continuation branch and moderate stenosis involving the right coronary artery.  2. Abnormal left ventricular function with apical hypokinesis and distal inferior hypokinesis.  He subsequently underwent a drug-eluting stent for in-stent restenosis of his LAD on 06/27/2008.   CXR from 03/30/12 showed COPD, LAD stents, no acute cardiopulmonary disease.  Labs from 03/30/13 noted. If additional labs are ordered by Dr. Montez Morita then they will need to be done on the day of  surgery.  If no acute changes then I would anticipate that he could proceed as planned.  Velna Ochs [facility] Short Stay Center/Anesthesiology Phone [phone] 04/02/2013 9:34 AM

## 2013-04-03 ENCOUNTER — Inpatient Hospital Stay (HOSPITAL_COMMUNITY): Payer: Medicare Other

## 2013-04-03 ENCOUNTER — Encounter (HOSPITAL_COMMUNITY)
Admission: RE | Disposition: A | Discharge status: Home Health | Payer: Self-pay | Source: Ambulatory Visit | Attending: Orthopedic Surgery

## 2013-04-03 ENCOUNTER — Encounter (HOSPITAL_COMMUNITY): Payer: Self-pay | Admitting: *Deleted

## 2013-04-03 ENCOUNTER — Encounter (HOSPITAL_COMMUNITY): Payer: Medicare Other | Admitting: Vascular Surgery

## 2013-04-03 ENCOUNTER — Inpatient Hospital Stay (HOSPITAL_COMMUNITY)
Admission: RE | Admit: 2013-04-03 | Discharge: 2013-04-06 | DRG: 470 | Disposition: A | Discharge status: Home Health | Payer: Medicare Other | Source: Ambulatory Visit | Attending: Orthopedic Surgery | Admitting: Orthopedic Surgery

## 2013-04-03 ENCOUNTER — Inpatient Hospital Stay (HOSPITAL_COMMUNITY): Payer: Medicare Other | Admitting: Anesthesiology

## 2013-04-03 DIAGNOSIS — K219 Gastro-esophageal reflux disease without esophagitis: Secondary | ICD-10-CM | POA: Diagnosis present

## 2013-04-03 DIAGNOSIS — F329 Major depressive disorder, single episode, unspecified: Secondary | ICD-10-CM | POA: Diagnosis present

## 2013-04-03 DIAGNOSIS — J4489 Other specified chronic obstructive pulmonary disease: Secondary | ICD-10-CM | POA: Diagnosis present

## 2013-04-03 DIAGNOSIS — Z9861 Coronary angioplasty status: Secondary | ICD-10-CM

## 2013-04-03 DIAGNOSIS — E119 Type 2 diabetes mellitus without complications: Secondary | ICD-10-CM | POA: Diagnosis present

## 2013-04-03 DIAGNOSIS — I252 Old myocardial infarction: Secondary | ICD-10-CM

## 2013-04-03 DIAGNOSIS — M1610 Unilateral primary osteoarthritis, unspecified hip: Principal | ICD-10-CM | POA: Diagnosis present

## 2013-04-03 DIAGNOSIS — I10 Essential (primary) hypertension: Secondary | ICD-10-CM | POA: Diagnosis present

## 2013-04-03 DIAGNOSIS — E785 Hyperlipidemia, unspecified: Secondary | ICD-10-CM | POA: Diagnosis present

## 2013-04-03 DIAGNOSIS — G8918 Other acute postprocedural pain: Secondary | ICD-10-CM

## 2013-04-03 DIAGNOSIS — F17200 Nicotine dependence, unspecified, uncomplicated: Secondary | ICD-10-CM | POA: Diagnosis present

## 2013-04-03 DIAGNOSIS — I2510 Atherosclerotic heart disease of native coronary artery without angina pectoris: Secondary | ICD-10-CM | POA: Diagnosis present

## 2013-04-03 DIAGNOSIS — F411 Generalized anxiety disorder: Secondary | ICD-10-CM | POA: Diagnosis present

## 2013-04-03 DIAGNOSIS — M169 Osteoarthritis of hip, unspecified: Principal | ICD-10-CM | POA: Diagnosis present

## 2013-04-03 DIAGNOSIS — G4733 Obstructive sleep apnea (adult) (pediatric): Secondary | ICD-10-CM | POA: Diagnosis present

## 2013-04-03 DIAGNOSIS — F3289 Other specified depressive episodes: Secondary | ICD-10-CM | POA: Diagnosis present

## 2013-04-03 DIAGNOSIS — J449 Chronic obstructive pulmonary disease, unspecified: Secondary | ICD-10-CM | POA: Diagnosis present

## 2013-04-03 HISTORY — PX: TOTAL HIP ARTHROPLASTY: SHX124

## 2013-04-03 LAB — GLUCOSE, CAPILLARY
Glucose-Capillary: 163 mg/dL — ABNORMAL HIGH (ref 70–99)
Glucose-Capillary: 139 mg/dL — ABNORMAL HIGH (ref 70–99)

## 2013-04-03 SURGERY — ARTHROPLASTY, HIP, TOTAL,POSTERIOR APPROACH
Anesthesia: General | Site: Hip | Laterality: Left | Wound class: Clean

## 2013-04-03 MED ORDER — MORPHINE SULFATE 4 MG/ML IJ SOLN
INTRAMUSCULAR | Status: AC
  Filled 2013-04-03: qty 1

## 2013-04-03 MED ORDER — DOCUSATE SODIUM 100 MG PO CAPS
100.0000 mg | ORAL_CAPSULE | Freq: Two times a day (BID) | ORAL | Status: DC
  Administered 2013-04-03 – 2013-04-06 (×6): 100 mg via ORAL
  Filled 2013-04-03 (×7): qty 1

## 2013-04-03 MED ORDER — LACTATED RINGERS IV SOLN
INTRAVENOUS | Status: DC | PRN
  Administered 2013-04-03 (×2): via INTRAVENOUS

## 2013-04-03 MED ORDER — ONDANSETRON HCL 4 MG PO TABS: 4.0000 mg | ORAL_TABLET | Freq: Four times a day (QID) | ORAL | Status: DC | PRN

## 2013-04-03 MED ORDER — WARFARIN - PHARMACIST DOSING INPATIENT: Freq: Every day | Status: DC

## 2013-04-03 MED ORDER — OXYCODONE HCL 5 MG/5ML PO SOLN: 5.0000 mg | Freq: Once | ORAL | Status: AC | PRN

## 2013-04-03 MED ORDER — LIDOCAINE HCL (CARDIAC) 20 MG/ML IV SOLN
INTRAVENOUS | Status: DC | PRN
  Administered 2013-04-03: 100 mg via INTRAVENOUS

## 2013-04-03 MED ORDER — CEFAZOLIN SODIUM-DEXTROSE 2-3 GM-% IV SOLR
INTRAVENOUS | Status: DC | PRN
  Administered 2013-04-03: 2 g via INTRAVENOUS

## 2013-04-03 MED ORDER — ACETAMINOPHEN 650 MG RE SUPP: 650.0000 mg | Freq: Four times a day (QID) | RECTAL | Status: DC | PRN

## 2013-04-03 MED ORDER — METOCLOPRAMIDE HCL 10 MG PO TABS: 5.0000 mg | ORAL_TABLET | Freq: Three times a day (TID) | ORAL | Status: DC | PRN

## 2013-04-03 MED ORDER — CEFAZOLIN SODIUM 1-5 GM-% IV SOLN
1.0000 g | Freq: Four times a day (QID) | INTRAVENOUS | Status: AC
  Administered 2013-04-03 (×2): 1 g via INTRAVENOUS
  Filled 2013-04-03 (×2): qty 50

## 2013-04-03 MED ORDER — ATENOLOL 25 MG PO TABS
25.0000 mg | ORAL_TABLET | Freq: Once | ORAL | Status: AC
  Administered 2013-04-03: 25 mg via ORAL
  Filled 2013-04-03 (×2): qty 1

## 2013-04-03 MED ORDER — WARFARIN SODIUM 7.5 MG PO TABS
7.5000 mg | ORAL_TABLET | Freq: Once | ORAL | Status: AC
  Administered 2013-04-03: 7.5 mg via ORAL
  Filled 2013-04-03: qty 1

## 2013-04-03 MED ORDER — ONDANSETRON HCL 4 MG/2ML IJ SOLN: 4.0000 mg | Freq: Four times a day (QID) | INTRAMUSCULAR | Status: DC | PRN

## 2013-04-03 MED ORDER — OXYCODONE HCL 5 MG PO TABS
5.0000 mg | ORAL_TABLET | Freq: Once | ORAL | Status: AC | PRN
  Administered 2013-04-03: 5 mg via ORAL

## 2013-04-03 MED ORDER — ALBUMIN HUMAN 5 % IV SOLN
INTRAVENOUS | Status: DC | PRN
  Administered 2013-04-03: 10:00:00 via INTRAVENOUS

## 2013-04-03 MED ORDER — OXYMETAZOLINE HCL 0.05 % NA SOLN
1.0000 | Freq: Two times a day (BID) | NASAL | Status: DC | PRN
  Filled 2013-04-03: qty 15

## 2013-04-03 MED ORDER — MENTHOL 3 MG MT LOZG: 1.0000 | LOZENGE | OROMUCOSAL | Status: DC | PRN

## 2013-04-03 MED ORDER — DEXTROSE-NACL 5-0.45 % IV SOLN
INTRAVENOUS | Status: DC
  Administered 2013-04-03: 20:00:00 via INTRAVENOUS

## 2013-04-03 MED ORDER — PHENYLEPHRINE HCL 10 MG/ML IJ SOLN
10.0000 mg | INTRAVENOUS | Status: DC | PRN
  Administered 2013-04-03: 50 ug/min via INTRAVENOUS

## 2013-04-03 MED ORDER — FENTANYL CITRATE 0.05 MG/ML IJ SOLN
INTRAMUSCULAR | Status: DC | PRN
  Administered 2013-04-03: 50 ug via INTRAVENOUS
  Administered 2013-04-03: 100 ug via INTRAVENOUS
  Administered 2013-04-03 (×2): 50 ug via INTRAVENOUS

## 2013-04-03 MED ORDER — WARFARIN VIDEO: Freq: Once | Status: DC

## 2013-04-03 MED ORDER — TESTOSTERONE 12.5 MG/ACT (1%) TD GEL: TRANSDERMAL | Status: DC

## 2013-04-03 MED ORDER — PROMETHAZINE HCL 25 MG/ML IJ SOLN: 6.2500 mg | INTRAMUSCULAR | Status: DC | PRN

## 2013-04-03 MED ORDER — PHENOL 1.4 % MT LIQD: 1.0000 | OROMUCOSAL | Status: DC | PRN

## 2013-04-03 MED ORDER — ATENOLOL 25 MG PO TABS
25.0000 mg | ORAL_TABLET | Freq: Every day | ORAL | Status: DC
  Administered 2013-04-03 – 2013-04-05 (×3): 25 mg via ORAL
  Filled 2013-04-03 (×4): qty 1

## 2013-04-03 MED ORDER — ACETAMINOPHEN 325 MG PO TABS: 650.0000 mg | ORAL_TABLET | Freq: Four times a day (QID) | ORAL | Status: DC | PRN

## 2013-04-03 MED ORDER — METHOCARBAMOL 500 MG PO TABS
500.0000 mg | ORAL_TABLET | Freq: Four times a day (QID) | ORAL | Status: DC | PRN
  Administered 2013-04-03 – 2013-04-04 (×3): 500 mg via ORAL
  Filled 2013-04-03 (×2): qty 1

## 2013-04-03 MED ORDER — HYDROCODONE-ACETAMINOPHEN 5-325 MG PO TABS
1.0000 | ORAL_TABLET | ORAL | Status: DC | PRN
  Administered 2013-04-03 – 2013-04-06 (×11): 2 via ORAL
  Filled 2013-04-03 (×10): qty 2

## 2013-04-03 MED ORDER — EPHEDRINE SULFATE 50 MG/ML IJ SOLN
INTRAMUSCULAR | Status: DC | PRN
  Administered 2013-04-03: 5 mg via INTRAVENOUS
  Administered 2013-04-03: 10 mg via INTRAVENOUS

## 2013-04-03 MED ORDER — MIRTAZAPINE 30 MG PO TABS
30.0000 mg | ORAL_TABLET | Freq: Every day | ORAL | Status: DC
  Administered 2013-04-03 – 2013-04-05 (×3): 30 mg via ORAL
  Filled 2013-04-03 (×4): qty 1

## 2013-04-03 MED ORDER — METHOCARBAMOL 500 MG PO TABS
ORAL_TABLET | ORAL | Status: AC
  Administered 2013-04-03: 500 mg via ORAL
  Filled 2013-04-03: qty 1

## 2013-04-03 MED ORDER — TIOTROPIUM BROMIDE MONOHYDRATE 18 MCG IN CAPS
18.0000 ug | ORAL_CAPSULE | Freq: Every morning | RESPIRATORY_TRACT | Status: DC
  Administered 2013-04-04 – 2013-04-05 (×2): 18 ug via RESPIRATORY_TRACT
  Filled 2013-04-03 (×3): qty 5

## 2013-04-03 MED ORDER — MORPHINE SULFATE 2 MG/ML IJ SOLN
INTRAMUSCULAR | Status: AC
  Administered 2013-04-03: 5 mg via INTRAVENOUS
  Filled 2013-04-03: qty 1

## 2013-04-03 MED ORDER — CEFAZOLIN SODIUM 1-5 GM-% IV SOLN
INTRAVENOUS | Status: AC
  Filled 2013-04-03: qty 100

## 2013-04-03 MED ORDER — GLYCOPYRROLATE 0.2 MG/ML IJ SOLN
INTRAMUSCULAR | Status: DC | PRN
  Administered 2013-04-03: .8 mg via INTRAVENOUS

## 2013-04-03 MED ORDER — ONDANSETRON HCL 4 MG/2ML IJ SOLN
INTRAMUSCULAR | Status: DC | PRN
  Administered 2013-04-03: 4 mg via INTRAVENOUS

## 2013-04-03 MED ORDER — ROCURONIUM BROMIDE 100 MG/10ML IV SOLN
INTRAVENOUS | Status: DC | PRN
  Administered 2013-04-03: 50 mg via INTRAVENOUS

## 2013-04-03 MED ORDER — PHENYLEPHRINE HCL 10 MG/ML IJ SOLN
INTRAMUSCULAR | Status: DC | PRN
  Administered 2013-04-03: 80 ug via INTRAVENOUS
  Administered 2013-04-03 (×3): 40 ug via INTRAVENOUS

## 2013-04-03 MED ORDER — AMLODIPINE BESYLATE 5 MG PO TABS
5.0000 mg | ORAL_TABLET | Freq: Every day | ORAL | Status: DC
  Administered 2013-04-03 – 2013-04-05 (×3): 5 mg via ORAL
  Filled 2013-04-03 (×4): qty 1

## 2013-04-03 MED ORDER — SODIUM CHLORIDE 0.9 % IR SOLN
Status: DC | PRN
  Administered 2013-04-03: 1000 mL

## 2013-04-03 MED ORDER — HYDROMORPHONE HCL PF 1 MG/ML IJ SOLN
1.0000 mg | INTRAMUSCULAR | Status: DC | PRN
  Administered 2013-04-03 – 2013-04-04 (×5): 1 mg via INTRAVENOUS
  Filled 2013-04-03 (×5): qty 1

## 2013-04-03 MED ORDER — HYDROMORPHONE HCL PF 1 MG/ML IJ SOLN
INTRAMUSCULAR | Status: AC
  Administered 2013-04-03: 0.5 mg via INTRAVENOUS
  Filled 2013-04-03: qty 1

## 2013-04-03 MED ORDER — NITROGLYCERIN 0.4 MG SL SUBL: 0.4000 mg | SUBLINGUAL_TABLET | SUBLINGUAL | Status: DC | PRN

## 2013-04-03 MED ORDER — NEOSTIGMINE METHYLSULFATE 1 MG/ML IJ SOLN
INTRAMUSCULAR | Status: DC | PRN
  Administered 2013-04-03: 5 mg via INTRAVENOUS

## 2013-04-03 MED ORDER — COQ10 100 MG PO CAPS: 1.0000 | ORAL_CAPSULE | Freq: Every day | ORAL | Status: DC

## 2013-04-03 MED ORDER — CELECOXIB 200 MG PO CAPS
200.0000 mg | ORAL_CAPSULE | Freq: Two times a day (BID) | ORAL | Status: DC
  Administered 2013-04-03 – 2013-04-06 (×7): 200 mg via ORAL
  Filled 2013-04-03 (×8): qty 1

## 2013-04-03 MED ORDER — MIDAZOLAM HCL 5 MG/5ML IJ SOLN
INTRAMUSCULAR | Status: DC | PRN
  Administered 2013-04-03: 2 mg via INTRAVENOUS

## 2013-04-03 MED ORDER — HYDROMORPHONE HCL PF 1 MG/ML IJ SOLN
0.2500 mg | INTRAMUSCULAR | Status: DC | PRN
  Administered 2013-04-03 (×4): 0.5 mg via INTRAVENOUS

## 2013-04-03 MED ORDER — OXYCODONE HCL 5 MG PO TABS
ORAL_TABLET | ORAL | Status: AC
  Administered 2013-04-03: 5 mg via ORAL
  Filled 2013-04-03: qty 1

## 2013-04-03 MED ORDER — HYDROCODONE-ACETAMINOPHEN 5-325 MG PO TABS
ORAL_TABLET | ORAL | Status: AC
  Administered 2013-04-03: 2 via ORAL
  Filled 2013-04-03: qty 2

## 2013-04-03 MED ORDER — FERROUS SULFATE 325 (65 FE) MG PO TABS
325.0000 mg | ORAL_TABLET | Freq: Three times a day (TID) | ORAL | Status: DC
  Administered 2013-04-03 – 2013-04-06 (×7): 325 mg via ORAL
  Filled 2013-04-03 (×12): qty 1

## 2013-04-03 MED ORDER — ALBUTEROL SULFATE HFA 108 (90 BASE) MCG/ACT IN AERS
INHALATION_SPRAY | RESPIRATORY_TRACT | Status: DC | PRN
  Administered 2013-04-03 (×3): 2 via RESPIRATORY_TRACT

## 2013-04-03 MED ORDER — MORPHINE SULFATE 2 MG/ML IJ SOLN
1.0000 mg | Freq: Once | INTRAMUSCULAR | Status: AC
  Administered 2013-04-03: 5 mg via INTRAVENOUS

## 2013-04-03 MED ORDER — ARTIFICIAL TEARS OP OINT
TOPICAL_OINTMENT | OPHTHALMIC | Status: DC | PRN
  Administered 2013-04-03: 1 via OPHTHALMIC

## 2013-04-03 MED ORDER — CLOPIDOGREL BISULFATE 75 MG PO TABS
75.0000 mg | ORAL_TABLET | Freq: Every day | ORAL | Status: DC
  Administered 2013-04-03 – 2013-04-05 (×3): 75 mg via ORAL
  Filled 2013-04-03 (×4): qty 1

## 2013-04-03 MED ORDER — SIMVASTATIN 5 MG PO TABS
5.0000 mg | ORAL_TABLET | Freq: Every day | ORAL | Status: DC
  Administered 2013-04-03 – 2013-04-05 (×3): 5 mg via ORAL
  Filled 2013-04-03 (×5): qty 1

## 2013-04-03 MED ORDER — COUMADIN BOOK
Freq: Once | Status: AC
  Administered 2013-04-03: 18:00:00
  Filled 2013-04-03: qty 1

## 2013-04-03 MED ORDER — LIDOCAINE HCL 4 % MT SOLN
OROMUCOSAL | Status: DC | PRN
  Administered 2013-04-03: 4 mL via TOPICAL

## 2013-04-03 MED ORDER — PROPOFOL 10 MG/ML IV BOLUS
INTRAVENOUS | Status: DC | PRN
  Administered 2013-04-03: 200 mg via INTRAVENOUS

## 2013-04-03 MED ORDER — METFORMIN HCL 500 MG PO TABS
1000.0000 mg | ORAL_TABLET | Freq: Every day | ORAL | Status: DC
  Administered 2013-04-03 – 2013-04-05 (×3): 1000 mg via ORAL
  Filled 2013-04-03 (×4): qty 2

## 2013-04-03 MED ORDER — METHOCARBAMOL 100 MG/ML IJ SOLN
500.0000 mg | Freq: Four times a day (QID) | INTRAVENOUS | Status: DC | PRN
  Filled 2013-04-03: qty 5

## 2013-04-03 MED ORDER — METOCLOPRAMIDE HCL 5 MG/ML IJ SOLN: 5.0000 mg | Freq: Three times a day (TID) | INTRAMUSCULAR | Status: DC | PRN

## 2013-04-03 SURGICAL SUPPLY — 43 items
BIT DRILL RINGLOC QUICK CONN (BIT) ×1 IMPLANT
BLADE SAW SAG 73X25 THK (BLADE) ×1
BLADE SAW SGTL 73X25 THK (BLADE) ×1 IMPLANT
BRUSH FEMORAL CANAL (MISCELLANEOUS) IMPLANT
CAPT HIP LEVEL 1 BIOMET ×2 IMPLANT
CLOTH BEACON ORANGE TIMEOUT ST (SAFETY) ×2 IMPLANT
COVER SURGICAL LIGHT HANDLE (MISCELLANEOUS) ×2 IMPLANT
DRAPE ORTHO SPLIT 77X108 STRL (DRAPES) ×2
DRAPE SURG ORHT 6 SPLT 77X108 (DRAPES) ×2 IMPLANT
DRAPE U-SHAPE 47X51 STRL (DRAPES) IMPLANT
DRILL BIT RINGLOC QUICK CONN (BIT) ×1
DRSG MEPILEX BORDER 4X12 (GAUZE/BANDAGES/DRESSINGS) ×2 IMPLANT
DRSG MEPILEX BORDER 4X8 (GAUZE/BANDAGES/DRESSINGS) IMPLANT
DURAPREP 26ML APPLICATOR (WOUND CARE) ×2 IMPLANT
ELECT CAUTERY BLADE 6.4 (BLADE) ×2 IMPLANT
ELECT REM PT RETURN 9FT ADLT (ELECTROSURGICAL) ×2
ELECTRODE REM PT RTRN 9FT ADLT (ELECTROSURGICAL) ×1 IMPLANT
EVACUATOR 3/16  PVC DRAIN (DRAIN)
EVACUATOR 3/16 PVC DRAIN (DRAIN) IMPLANT
FACESHIELD LNG OPTICON STERILE (SAFETY) ×2 IMPLANT
GLOVE SS PI 9.0 STRL (GLOVE) ×2 IMPLANT
GOWN PREVENTION PLUS XLARGE (GOWN DISPOSABLE) ×2 IMPLANT
GOWN STRL NON-REIN LRG LVL3 (GOWN DISPOSABLE) ×4 IMPLANT
HANDPIECE INTERPULSE COAX TIP (DISPOSABLE)
KIT BASIN OR (CUSTOM PROCEDURE TRAY) ×2 IMPLANT
KIT ROOM TURNOVER OR (KITS) ×2 IMPLANT
MANIFOLD NEPTUNE II (INSTRUMENTS) ×2 IMPLANT
NS IRRIG 1000ML POUR BTL (IV SOLUTION) ×2 IMPLANT
PACK TOTAL JOINT (CUSTOM PROCEDURE TRAY) ×2 IMPLANT
PAD ARMBOARD 7.5X6 YLW CONV (MISCELLANEOUS) ×4 IMPLANT
PILLOW ABDUCTION HIP (SOFTGOODS) ×2 IMPLANT
PRESSURIZER FEMORAL UNIV (MISCELLANEOUS) IMPLANT
SET HNDPC FAN SPRY TIP SCT (DISPOSABLE) IMPLANT
SPONGE LAP 18X18 X RAY DECT (DISPOSABLE) IMPLANT
STAPLER VISISTAT 35W (STAPLE) ×2 IMPLANT
SUCTION FRAZIER TIP 10 FR DISP (SUCTIONS) IMPLANT
SUT VIC AB 0 CTB1 27 (SUTURE) ×6 IMPLANT
SUT VIC AB 2-0 CTB1 (SUTURE) ×4 IMPLANT
TOWEL OR 17X24 6PK STRL BLUE (TOWEL DISPOSABLE) ×2 IMPLANT
TOWEL OR 17X26 10 PK STRL BLUE (TOWEL DISPOSABLE) ×2 IMPLANT
TOWER CARTRIDGE SMART MIX (DISPOSABLE) IMPLANT
TRAY FOLEY CATH 16FRSI W/METER (SET/KITS/TRAYS/PACK) IMPLANT
WATER STERILE IRR 1000ML POUR (IV SOLUTION) ×8 IMPLANT

## 2013-04-03 NOTE — Anesthesia Preprocedure Evaluation (Signed)
Anesthesia Evaluation  Patient identified by MRN, date of birth, ID band Patient awake    Reviewed: Allergy & Precautions, H&P , NPO status   Airway Mallampati: II      Dental   Pulmonary shortness of breath, sleep apnea , COPDCurrent Smoker,  breath sounds clear to auscultation        Cardiovascular hypertension, + CAD, + Past MI and + Cardiac Stents Rhythm:Regular Rate:Normal  Echo shows mildly reduced LV function   Neuro/Psych    GI/Hepatic GERD-  ,  Endo/Other  diabetes  Renal/GU      Musculoskeletal   Abdominal   Peds  Hematology   Anesthesia Other Findings   Reproductive/Obstetrics                           Anesthesia Physical Anesthesia Plan  ASA: III  Anesthesia Plan: General   Post-op Pain Management:    Induction: Intravenous  Airway Management Planned: Oral ETT  Additional Equipment:   Intra-op Plan:   Post-operative Plan: Extubation in OR  Informed Consent: I have reviewed the patients History and Physical, chart, labs and discussed the procedure including the risks, benefits and alternatives for the proposed anesthesia with the patient or authorized representative who has indicated his/her understanding and acceptance.   Dental advisory given  Plan Discussed with: CRNA and Surgeon  Anesthesia Plan Comments:         Anesthesia Quick Evaluation

## 2013-04-03 NOTE — Preoperative (Signed)
Beta Blockers   Reason not to administer Beta Blockers:atenolol taken this am

## 2013-04-03 NOTE — Transfer of Care (Signed)
Immediate Anesthesia Transfer of Care Note  Patient: [name]  Procedure(s) Performed: Procedure(s): LEFT TOTAL HIP ARTHROPLASTY (Left)  Patient Location: PACU  Anesthesia Type:General  Level of Consciousness: awake and patient cooperative  Airway & Oxygen Therapy: Patient Spontanous Breathing and Patient connected to face mask oxygen  Post-op Assessment: Report given to PACU RN, Post -op Vital signs reviewed and stable and Patient moving all extremities X 4  Post vital signs: Reviewed and stable  Complications: No apparent anesthesia complications

## 2013-04-03 NOTE — Anesthesia Procedure Notes (Signed)
Procedure Name: Intubation Date/Time: 04/03/2013 8:01 AM Performed by: Sherie Don Pre-anesthesia Checklist: Patient identified, Emergency Drugs available, Suction available, Patient being monitored and Timeout performed Patient Re-evaluated:Patient Re-evaluated prior to inductionOxygen Delivery Method: Circle system utilized Preoxygenation: Pre-oxygenation with 100% oxygen Intubation Type: IV induction Ventilation: Mask ventilation without difficulty Laryngoscope Size: Mac and 4 Grade View: Grade I Tube type: Oral Tube size: 7.5 mm Number of attempts: 1 Airway Equipment and Method: Stylet Placement Confirmation: ETT inserted through vocal cords under direct vision,  positive ETCO2 and breath sounds checked- equal and bilateral Secured at: 23 cm Tube secured with: Tape Dental Injury: Teeth and Oropharynx as per pre-operative assessment

## 2013-04-03 NOTE — Progress Notes (Signed)
UR COMPLETED  

## 2013-04-03 NOTE — Anesthesia Postprocedure Evaluation (Signed)
  Anesthesia Post-op Note  Patient: [name]  Procedure(s) Performed: Procedure(s): LEFT TOTAL HIP ARTHROPLASTY (Left)  Patient Location: PACU  Anesthesia Type:General  Level of Consciousness: awake  Airway and Oxygen Therapy: Patient Spontanous Breathing  Post-op Pain: mild  Post-op Assessment: Post-op Vital signs reviewed  Post-op Vital Signs: stable  Complications: No apparent anesthesia complications

## 2013-04-03 NOTE — Brief Op Note (Signed)
04/03/2013  10:57 AM  PATIENT:  [name]  62 y.o. male  PRE-OPERATIVE DIAGNOSIS:  DJD LEFT HIP  POST-OPERATIVE DIAGNOSIS:  DJD LEFT HIP  PROCEDURE:  Procedure(s): LEFT TOTAL HIP ARTHROPLASTY (Left)  SURGEON:  Surgeon(s) and Role:    * Kennieth Rad, MD - Primary  PHYSICIAN ASSISTANT:   ASSISTANTS: none   ANESTHESIA:   general  EBL:  Total I/O In: 1400 [I.V.:1400] Out: 300 [Urine:150; Blood:150]  BLOOD ADMINISTERED:none  DRAINS: none   LOCAL MEDICATIONS USED:  NONE  SPECIMEN:  No Specimen  DISPOSITION OF SPECIMEN:  N/A  COUNTS:  YES  TOURNIQUET:  * No tourniquets in log *  DICTATION: .Other Dictation: Dictation Number  REPORT #[id number]  PLAN OF CARE: Admit to inpatient   PATIENT DISPOSITION:  PACU - hemodynamically stable.   Delay start of Pharmacological VTE agent (>24hrs) due to surgical blood loss or risk of bleeding: yes

## 2013-04-03 NOTE — H&P (Signed)
TOTAL HIP ADMISSION H&P  Patient is admitted for left total hip arthroplasty.  Subjective:  Chief Complaint: left hip pain  HPI: [name], 62 y.o. male, has a history of pain and functional disability in the left hip(s) due to arthritis and patient has failed non-surgical conservative treatments for greater than 12 weeks to include NSAID's and/or analgesics, flexibility and strengthening excercises, use of assistive devices, weight reduction as appropriate and activity modification.  Onset of symptoms was gradual starting 8 years ago with gradually worsening course since that time.The patient noted no past surgery on the left hip(s).  Patient currently rates pain in the left hip at 8 out of 10 with activity. Patient has night pain, worsening of pain with activity and weight bearing, trendelenberg gait, pain that interfers with activities of daily living and pain with passive range of motion. Patient has evidence of subchondral cysts, subchondral sclerosis, periarticular osteophytes, joint subluxation and joint space narrowing by imaging studies. This condition presents safety issues increasing the risk of falls. This patient has had no other condition.  There is no current active infection.  Patient Active Problem List   Diagnosis Date Noted  . Depression 06/30/2011   Past Medical History  Diagnosis Date  . HTN (hypertension)   . COPD (chronic obstructive pulmonary disease)   . Obstructive sleep apnea     sleep study 2006, uses CPAP  . Coronary artery disease     x4  . Myocardial infarction     x2  . Diabetes mellitus without complication   . Anxiety   . Arthritis   . HLD (hyperlipidemia)   . Shortness of breath     exertion  . Depression   . GERD (gastroesophageal reflux disease)     tums will relieve  . On home oxygen therapy     2L via nasal cannula prn    Past Surgical History  Procedure Laterality Date  . Total hip arthroplasty  04/06/2012    Procedure: TOTAL HIP  ARTHROPLASTY;  Surgeon: Kennieth Rad, MD;  Location: [facility] OR;  Service: Orthopedics;  Laterality: Right;  . Cardiac catheterization  2003,2010    x4   . Coronary angioplasty      stents x 4  . Dental surgery      Prescriptions prior to admission  Medication Sig Dispense Refill  . amLODipine (NORVASC) 5 MG tablet Take 5 mg by mouth at bedtime.       Marland Kitchen atenolol (TENORMIN) 25 MG tablet Take 25 mg by mouth at bedtime.       . Coenzyme Q10 (COQ10) 100 MG CAPS Take 1 capsule by mouth at bedtime.      . docusate sodium (COLACE) 100 MG capsule Take 100 mg by mouth daily as needed (for constipation).      Marland Kitchen etodolac (LODINE) 400 MG tablet Take 400 mg by mouth 2 (two) times daily as needed (for pain).      . fish oil-omega-3 fatty acids 1000 MG capsule Take 1 g by mouth at bedtime.      Marland Kitchen HYDROcodone-acetaminophen (NORCO/VICODIN) 5-325 MG per tablet Take 1 tablet by mouth every 6 (six) hours as needed for moderate pain.      . metFORMIN (GLUCOPHAGE) 500 MG tablet Take 1,000 mg by mouth at bedtime.       . mirtazapine (REMERON) 30 MG tablet Take 1 tablet (30 mg total) by mouth at bedtime.  30 tablet  2  . Multiple Vitamin (MULTIVITAMIN WITH MINERALS) TABS  Take 1 tablet by mouth 2 (two) times a week.       Marland Kitchen OVER THE COUNTER MEDICATION Take by mouth See admin instructions. Vitamin B Complex Sublingual Liquid. Place 1 dropper full under the tongue as needed when feeling tired.      Marland Kitchen oxyCODONE-acetaminophen (PERCOCET/ROXICET) 5-325 MG per tablet Take 1 tablet by mouth every 6 (six) hours as needed for moderate pain or severe pain.      Marland Kitchen oxymetazoline (AFRIN) 0.05 % nasal spray Place 1 spray into both nostrils 2 (two) times daily as needed for congestion.      . pravastatin (PRAVACHOL) 80 MG tablet Take 80 mg by mouth at bedtime.      . Testosterone (ANDROGEL PUMP) 12.5 MG/ACT (1%) GEL Place onto the skin See admin instructions. Apply 1 pump to each shoulder once daily      . tiotropium (SPIRIVA) 18 MCG  inhalation capsule Place 18 mcg into inhaler and inhale every morning.      Marland Kitchen aspirin EC 81 MG tablet Take 81 mg by mouth at bedtime.      . clopidogrel (PLAVIX) 75 MG tablet Take 75 mg by mouth at bedtime.       . nitroGLYCERIN (NITROSTAT) 0.4 MG SL tablet Place 0.4 mg under the tongue every 5 (five) minutes as needed for chest pain.      . tadalafil (CIALIS) 20 MG tablet Take 20 mg by mouth daily as needed. For erectile dysfunction       No Known Allergies  History  Substance Use Topics  . Smoking status: Current Some Day Smoker -- 0.30 packs/day for 45 years    Types: Cigarettes  . Smokeless tobacco: Not on file  . Alcohol Use: No    Family History  Problem Relation Age of Onset  . Depression Mother      Review of Systems  Constitutional: Negative.   HENT: Negative.   Eyes: Negative.   Respiratory: Negative.   Cardiovascular: Negative.   Gastrointestinal: Negative.   Genitourinary: Negative.   Musculoskeletal: Positive for joint pain.  Skin: Negative.   Neurological: Negative.   Endo/Heme/Allergies: Negative.   Psychiatric/Behavioral: Negative.     Objective:  Physical ExamLEFT HIP WITH LIMITED ROM, PAIN ON ROTATION AND FLEXION, ONE HALF INCH SHORTENING, ANTALGIC GAIT.  Vital signs in last 24 hours: Temp:  [98.1 F (36.7 C)] 98.1 F (36.7 C) (11/18 0655) Pulse Rate:  [72] 72 (11/18 0655) Resp:  [20] 20 (11/18 0655) BP: (111)/(79) 111/79 mmHg (11/18 0655) SpO2:  [96 %] 96 % (11/18 0655)  Labs:   Estimated body mass index is 26.40 kg/(m^2) as calculated from the following:   Height as of 03/30/13: 5\' 10"  (1.778 m).   Weight as of 01/11/13: 83.462 kg (184 lb).   Imaging Review Plain radiographs demonstrate moderate degenerative joint disease of the left hip(s). The bone quality appears to be good for age and reported activity level.  Assessment/Plan:  End stage arthritis, left hip(s)  The patient history, physical examination, clinical judgement of the  provider and imaging studies are consistent with end stage degenerative joint disease of the left hip(s) and total hip arthroplasty is deemed medically necessary. The treatment options including medical management, injection therapy, arthroscopy and arthroplasty were discussed at length. The risks and benefits of total hip arthroplasty were presented and reviewed. The risks due to aseptic loosening, infection, stiffness, dislocation/subluxation,  thromboembolic complications and other imponderables were discussed.  The patient acknowledged the explanation, agreed to proceed  with the plan and consent was signed. Patient is being admitted for inpatient treatment for surgery, pain control, PT, OT, prophylactic antibiotics, VTE prophylaxis, progressive ambulation and ADL's and discharge planning.The patient is planning to be discharged home with home health services

## 2013-04-03 NOTE — Progress Notes (Signed)
ANTICOAGULATION CONSULT NOTE - Initial Consult  Pharmacy Consult for Coumadin Indication: VTE prophylaxis s/p L THA  No Known Allergies  Patient Measurements:   Height ~ 178 cm Weight ~ 82 kg  Vital Signs: Temp: 97.9 F (36.6 C) (11/18 1332) Temp src: Oral (11/18 0655) BP: 115/76 mmHg (11/18 1332) Pulse Rate: 72 (11/18 1332)  Labs: Baseline INR 1.02 Preop CBC Hgb 16.1, PLTC 277   Medical History: Past Medical History  Diagnosis Date  . HTN (hypertension)   . COPD (chronic obstructive pulmonary disease)   . Obstructive sleep apnea     sleep study 2006, uses CPAP  . Coronary artery disease     x4  . Myocardial infarction     x2  . Diabetes mellitus without complication   . Anxiety   . Arthritis   . HLD (hyperlipidemia)   . Shortness of breath     exertion  . Depression   . GERD (gastroesophageal reflux disease)     tums will relieve  . On home oxygen therapy     2L via nasal cannula prn    Medications:  Prescriptions prior to admission  Medication Sig Dispense Refill  . amLODipine (NORVASC) 5 MG tablet Take 5 mg by mouth at bedtime.       Marland Kitchen atenolol (TENORMIN) 25 MG tablet Take 25 mg by mouth at bedtime.       . Coenzyme Q10 (COQ10) 100 MG CAPS Take 1 capsule by mouth at bedtime.      . docusate sodium (COLACE) 100 MG capsule Take 100 mg by mouth daily as needed (for constipation).      Marland Kitchen etodolac (LODINE) 400 MG tablet Take 400 mg by mouth 2 (two) times daily as needed (for pain).      . fish oil-omega-3 fatty acids 1000 MG capsule Take 1 g by mouth at bedtime.      Marland Kitchen HYDROcodone-acetaminophen (NORCO/VICODIN) 5-325 MG per tablet Take 1 tablet by mouth every 6 (six) hours as needed for moderate pain.      . metFORMIN (GLUCOPHAGE) 500 MG tablet Take 1,000 mg by mouth at bedtime.       . mirtazapine (REMERON) 30 MG tablet Take 1 tablet (30 mg total) by mouth at bedtime.  30 tablet  2  . Multiple Vitamin (MULTIVITAMIN WITH MINERALS) TABS Take 1 tablet by mouth 2  (two) times a week.       Marland Kitchen OVER THE COUNTER MEDICATION Take by mouth See admin instructions. Vitamin B Complex Sublingual Liquid. Place 1 dropper full under the tongue as needed when feeling tired.      Marland Kitchen oxyCODONE-acetaminophen (PERCOCET/ROXICET) 5-325 MG per tablet Take 1 tablet by mouth every 6 (six) hours as needed for moderate pain or severe pain.      Marland Kitchen oxymetazoline (AFRIN) 0.05 % nasal spray Place 1 spray into both nostrils 2 (two) times daily as needed for congestion.      . pravastatin (PRAVACHOL) 80 MG tablet Take 80 mg by mouth at bedtime.      . Testosterone (ANDROGEL PUMP) 12.5 MG/ACT (1%) GEL Place onto the skin See admin instructions. Apply 1 pump to each shoulder once daily      . tiotropium (SPIRIVA) 18 MCG inhalation capsule Place 18 mcg into inhaler and inhale every morning.      Marland Kitchen aspirin EC 81 MG tablet Take 81 mg by mouth at bedtime.      . clopidogrel (PLAVIX) 75 MG tablet Take 75 mg by  mouth at bedtime.       . nitroGLYCERIN (NITROSTAT) 0.4 MG SL tablet Place 0.4 mg under the tongue every 5 (five) minutes as needed for chest pain.      . tadalafil (CIALIS) 20 MG tablet Take 20 mg by mouth daily as needed. For erectile dysfunction        Assessment: 62 yo M s/p L THA to start Coumadin for post-op VTE prophylaxis.  Goal of Therapy:  INR 2-3 Monitor platelets by anticoagulation protocol: Yes   Plan:  Coumadin 7.5mg  Po x 1 tonight. Daily INR.  Coumadin education materials ordered.  [employer], Pharm.D., BCPS Clinical Pharmacist Pager [phone] 04/03/2013 2:28 PM

## 2013-04-03 NOTE — Evaluation (Signed)
Physical Therapy Evaluation Patient Details Name: [name] MRN: [id number] DOB: 1951-04-08 Today's Date: 04/03/2013 Time: 4098-1191 PT Time Calculation (min): 22 min  PT Assessment / Plan / Recommendation History of Present Illness  s/p elective Lt THA pt has had previous Rt THA last year.   Clinical Impression  Pt is s/p Lt THA POD #0 resulting in the deficits listed below (see PT Problem List).  Pt will benefit from skilled PT to increase their independence and safety with mobility to allow discharge to the venue listed below. Pt with increased labored breathing. Requires 2L O2 PRN at home. Pt transferred on 2L O2 today. Anticipate steady D/C, pt highly motivated.       PT Assessment  Patient needs continued PT services    Follow Up Recommendations  Home health PT;Supervision/Assistance - 24 hour    Does the patient have the potential to tolerate intense rehabilitation      Barriers to Discharge        Equipment Recommendations  None recommended by PT    Recommendations for Other Services OT consult   Frequency 7X/week    Precautions / Restrictions Precautions Precautions: Posterior Hip;Fall Precaution Booklet Issued: Yes (comment) Precaution Comments: pt is on 2L as needed O2; hx of COPD  Restrictions Weight Bearing Restrictions: Yes RLE Weight Bearing: Weight bearing as tolerated   Pertinent Vitals/Pain 5/10; patient repositioned for comfort       Mobility  Bed Mobility Bed Mobility: Supine to Sit;Sitting - Scoot to Edge of Bed Supine to Sit: 4: Min assist;HOB elevated;With rails Sitting - Scoot to Edge of Bed: 4: Min assist;With rail Details for Bed Mobility Assistance: (A) to advance Lt LE to/off EOB; cues for hand placement and sequencing  Transfers Transfers: Sit to Stand;Stand to Sit;Stand Pivot Transfers Sit to Stand: 4: Min assist;From bed;With upper extremity assist Stand to Sit: 4: Min assist;With upper extremity assist;To chair/3-in-1;With  armrests Stand Pivot Transfers: 4: Min assist Details for Transfer Assistance: (A) to complete transfers while maintaining balance and adhering to hip precautions; cues for hand placement and sequencing with RW   Ambulation/Gait Ambulation/Gait Assistance: Not tested (comment) Stairs: No Wheelchair Mobility Wheelchair Mobility: No    Exercises Total Joint Exercises Ankle Circles/Pumps: AROM;Both;10 reps;Supine   PT Diagnosis: Difficulty walking;Generalized weakness;Acute pain  PT Problem List: Decreased strength;Decreased range of motion;Decreased activity tolerance;Decreased balance;Decreased mobility;Decreased knowledge of use of DME;Decreased safety awareness;Decreased knowledge of precautions;Pain PT Treatment Interventions: DME instruction;Gait training;Stair training;Functional mobility training;Therapeutic activities;Therapeutic exercise;Balance training;Neuromuscular re-education;Patient/family education     PT Goals(Current goals can be found in the care plan section) Acute Rehab PT Goals Patient Stated Goal: to go home with wife when ready PT Goal Formulation: With patient Time For Goal Achievement: 04/10/13 Potential to Achieve Goals: Good  Visit Information  Last PT Received On: 04/03/13 Assistance Needed: +1 History of Present Illness: s/p elective Lt THA        Prior Functioning  Home Living Family/patient expects to be discharged to:: Private residence Living Arrangements: Spouse/significant other Available Help at Discharge: Family;Available 24 hours/day Type of Home: House Home Access: Stairs to enter [employer] of Steps: 3 Entrance Stairs-Rails: Can reach both;Right;Left Home Layout: One level Home Equipment: Walker - 2 wheels;Bedside commode;Shower seat Prior Function Level of Independence: Independent Comments: on his 'bad days' his wife will help dress his  LEs for dressing and bathing  Communication Communication: No  difficulties Dominant Hand: Right    Cognition  Cognition Arousal/Alertness: Awake/alert Behavior During Therapy:  WFL for tasks assessed/performed Overall Cognitive Status: Within Functional Limits for tasks assessed    Extremity/Trunk Assessment Upper Extremity Assessment Upper Extremity Assessment: Defer to OT evaluation Lower Extremity Assessment Lower Extremity Assessment: RLE deficits/detail RLE: Unable to fully assess due to pain Cervical / Trunk Assessment Cervical / Trunk Assessment: Normal   Balance Balance Balance Assessed: Yes Static Sitting Balance Static Sitting - Balance Support: Bilateral upper extremity supported;Feet unsupported Static Sitting - Level of Assistance: 5: Stand by assistance Static Sitting - Comment/# of Minutes: tolerated sitting position ~4 min  End of Session PT - End of Session Equipment Utilized During Treatment: Gait belt;Oxygen Activity Tolerance: Patient limited by pain;Patient limited by fatigue Patient left: in chair;with call bell/phone within reach Nurse Communication: Mobility status  GP     [name], [place] [phone] 04/03/2013, 3:42 PM

## 2013-04-03 NOTE — Progress Notes (Signed)
04/03/13 2115  BiPAP/CPAP/SIPAP  BiPAP/CPAP/SIPAP Pt Type Adult  Mask Type Full face mask  Mask Size Medium  IPAP 12 cmH20  EPAP 12 cmH2O  Flow Rate 4 lpm  BiPAP/CPAP/SIPAP CPAP  Patient Home Equipment No (Except for mask)  Auto Titrate No  Patient placed on CPAP of 12cmh2o per home settings with 4L oxygen bleed in. He tolerates it very well at this time.

## 2013-04-03 NOTE — Plan of Care (Signed)
Problem: Consults Goal: Diagnosis- Total Joint Replacement Primary Total Hip Left     

## 2013-04-04 ENCOUNTER — Other Ambulatory Visit: Payer: Self-pay | Admitting: Orthopedic Surgery

## 2013-04-04 LAB — GLUCOSE, CAPILLARY
Glucose-Capillary: 128 mg/dL — ABNORMAL HIGH (ref 70–99)
Glucose-Capillary: 132 mg/dL — ABNORMAL HIGH (ref 70–99)

## 2013-04-04 LAB — CBC
MCH: 29.5 pg (ref 26.0–34.0)
MCHC: 35 g/dL (ref 30.0–36.0)
MCV: 84.2 fL (ref 78.0–100.0)
Platelets: 213 10*3/uL (ref 150–400)
RBC: 3.87 MIL/uL — ABNORMAL LOW (ref 4.22–5.81)
RDW: 13.7 % (ref 11.5–15.5)

## 2013-04-04 LAB — PROTIME-INR
INR: 1.15 (ref 0.00–1.49)
Prothrombin Time: 14.5 seconds (ref 11.6–15.2)

## 2013-04-04 MED ORDER — ZOLPIDEM TARTRATE 5 MG PO TABS
5.0000 mg | ORAL_TABLET | Freq: Every evening | ORAL | Status: DC | PRN
  Administered 2013-04-04 – 2013-04-05 (×2): 5 mg via ORAL
  Filled 2013-04-04 (×2): qty 1

## 2013-04-04 MED ORDER — WARFARIN SODIUM 6 MG PO TABS
6.0000 mg | ORAL_TABLET | Freq: Once | ORAL | Status: AC
  Administered 2013-04-04: 6 mg via ORAL
  Filled 2013-04-04: qty 1

## 2013-04-04 MED ORDER — DIPHENHYDRAMINE HCL 25 MG PO CAPS
25.0000 mg | ORAL_CAPSULE | Freq: Four times a day (QID) | ORAL | Status: DC | PRN
  Administered 2013-04-04 (×2): 25 mg via ORAL
  Filled 2013-04-04: qty 1

## 2013-04-04 NOTE — Progress Notes (Signed)
Physical Therapy Treatment Patient Details Name: [name] MRN: [id number] DOB: 05-Jun-1950 Today's Date: 04/04/2013 Time: 4098-1191 PT Time Calculation (min): 42 min  PT Assessment / Plan / Recommendation  History of Present Illness s/p elective Lt THA    PT Comments   Pt pleasant & willing to participate in therapy but required increased assist for bed mobility & sit<>stand transfers compared to PT eval yesterday.  Cont with current POC to maximize functional mobility & safety prior to d/c home.     Follow Up Recommendations  Home health PT;Supervision/Assistance - 24 hour     Does the patient have the potential to tolerate intense rehabilitation     Barriers to Discharge        Equipment Recommendations  None recommended by PT    Recommendations for Other Services OT consult  Frequency 7X/week   Progress towards PT Goals Progress towards PT goals: Progressing toward goals (slowly)  Plan Current plan remains appropriate    Precautions / Restrictions Precautions Precautions: Posterior Hip;Fall Precaution Comments: pt is on 2L as needed O2; hx of COPD  Restrictions RLE Weight Bearing: Weight bearing as tolerated   Pertinent Vitals/Pain Very SOB.  3L 02 with activity.      Mobility  Bed Mobility Bed Mobility: Supine to Sit;Sitting - Scoot to Edge of Bed Supine to Sit: 3: Mod assist;HOB flat;With rails Sitting - Scoot to Edge of Bed: 3: Mod assist Details for Bed Mobility Assistance: Max cues for sequencing & technique.  Very effortful for pt.  (A) for RLE & to lift shoulders/trunk to sitting upright & use of draw pad to bring hips closer to EOB.   Transfers Transfers: Sit to Stand;Stand to Sit;Stand Pivot Transfers Sit to Stand: With upper extremity assist;From bed;From elevated surface;3: Mod assist Stand to Sit: 4: Min assist;With upper extremity assist;With armrests;To bed;To chair/3-in-1 Stand Pivot Transfers: 4: Min assist Details for Transfer Assistance:  cues for hand placement, RLE positioning, & hip precautions as Rt knee rotated inwards.   Required standing from bed x 3 trials before he was ready to pivot around to recliner.  (A) to power up to standing, balance as he leans backwards, stabilize RW, & controlled descent.  Height of bed elevated.  Very effortful for pt Ambulation/Gait Ambulation/Gait Assistance: Not tested (comment)    Exercises Total Joint Exercises Ankle Circles/Pumps: AROM;Both;10 reps Heel Slides: AAROM;Strengthening;Left;10 reps;Supine Hip ABduction/ADduction: AAROM;Strengthening;Left;10 reps;Supine Straight Leg Raises: AAROM;Strengthening;Left;10 reps     PT Goals (current goals can now be found in the care plan section) Acute Rehab PT Goals PT Goal Formulation: With patient Time For Goal Achievement: 04/10/13 Potential to Achieve Goals: Good  Visit Information  Last PT Received On: 04/04/13 Assistance Needed: +2 (ambulation) History of Present Illness: s/p elective Lt THA     Subjective Data      Cognition  Cognition Arousal/Alertness: Awake/alert Behavior During Therapy: WFL for tasks assessed/performed Overall Cognitive Status: Within Functional Limits for tasks assessed    Balance     End of Session PT - End of Session Activity Tolerance: Patient limited by fatigue Patient left: in chair;with call bell/phone within reach;with family/visitor present Nurse Communication: Mobility status   GP     Lara Mulch 04/04/2013, 1:48 PM  Verdell Face, PTA [phone] 04/04/2013

## 2013-04-04 NOTE — Progress Notes (Signed)
Patient [name] infiltrated. Arm swollen and [name] discontinued. Heat pack applied to [name] site and arm elevated on pillows. Will monitor.

## 2013-04-04 NOTE — Progress Notes (Signed)
Physical Therapy Treatment Patient Details Name: [name] MRN: [id number] DOB: Mar 01, 1951 Today's Date: 04/04/2013 Time: 4098-1191 PT Time Calculation (min): 26 min  PT Assessment / Plan / Recommendation  History of Present Illness s/p elective Lt THA    PT Comments   Pt able to ambulate ~30' this session.  Cont's to have most difficulty with bed mobility & sit<>stand transfers.  Pt also requiring max cueing to reinforce hip precautions with mobility.      Follow Up Recommendations  Home health PT;Supervision/Assistance - 24 hour     Does the patient have the potential to tolerate intense rehabilitation     Barriers to Discharge        Equipment Recommendations  None recommended by PT    Recommendations for Other Services OT consult  Frequency 7X/week   Progress towards PT Goals Progress towards PT goals: Progressing toward goals  Plan Current plan remains appropriate    Precautions / Restrictions Precautions Precautions: Posterior Hip;Fall Precaution Comments: Pt requires max cueing to reinforce hip precautions with bed mobility & sit>stand transfers.   Restrictions RLE Weight Bearing: Weight bearing as tolerated       Mobility  Bed Mobility Bed Mobility: Supine to Sit;Sitting - Scoot to [place] of Bed;Sit to Supine Supine to Sit: 4: Min assist;With rails;HOB elevated Sitting - Scoot to [place] of Bed: 4: Min guard Sit to Supine: 1: +2 Total assist;HOB flat Sit to Supine: Patient Percentage: 60% Details for Bed Mobility Assistance: Cues for hip precautions & technique.  Cont's to be effortful.   Transfers Transfers: Sit to Stand;Stand to Sit Sit to Stand: 3: Mod assist;With upper extremity assist;From bed Stand to Sit: 2: Max assist;To bed Stand Pivot Transfers: 4: Min assist Details for Transfer Assistance: cues for hand placement, body positioning before sitting, & RLE positioning & to prevent internal rotation.   Pt became very SOB at end as he was pivoting  around to bed & sat quickly without ensuring safe body positioning requiring max assist to pivot hips around to prevent falling in floor Ambulation/Gait Ambulation/Gait Assistance: 4: Min guard Ambulation Distance (Feet): 30 Feet Assistive device: Rolling walker Ambulation/Gait Assistance Details: cues for sequencing, RW advancement, & hip precautions with turns Gait Pattern: Step-to pattern;Decreased weight shift to right;Decreased step length - left Gait velocity: decreased General Gait Details: becomes very SOB; on 3L 02.   Stairs: No Wheelchair Mobility Wheelchair Mobility: No    Exercises Total Joint Exercises Ankle Circles/Pumps: AROM;Both;10 reps Heel Slides: AAROM;Strengthening;Left;10 reps;Supine Hip ABduction/ADduction: AAROM;Strengthening;Left;10 reps;Supine Straight Leg Raises: AAROM;Strengthening;Left;10 reps     PT Goals (current goals can now be found in the care plan section) Acute Rehab PT Goals PT Goal Formulation: With patient Time For Goal Achievement: 04/10/13 Potential to Achieve Goals: Good  Visit Information  Last PT Received On: 04/04/13 Assistance Needed: +1 History of Present Illness: s/p elective Lt THA     Subjective Data      Cognition  Cognition Arousal/Alertness: Awake/alert Behavior During Therapy: WFL for tasks assessed/performed Overall Cognitive Status: Within Functional Limits for tasks assessed    Balance     End of Session PT - End of Session Equipment Utilized During Treatment: Oxygen (3L 02) Activity Tolerance: Patient limited by fatigue Patient left: in chair;with call bell/phone within reach Nurse Communication: Mobility status   GP     Lara Mulch 04/04/2013, 2:57 PM   Verdell Face, PTA [phone] 04/04/2013

## 2013-04-04 NOTE — Progress Notes (Signed)
04/04/13 Spoke with patient about HHC, he chose [facility] from [employer]. Contacted Debbie at [facility] and set up HHPT and [facility] for coumadin monitoring.Patient states that he has a rolling walker and a 3N1 at home. No other discharge needs identified. Will continue to follow for d/c needs. Jacquelynn Cree RN, BSN, CCM

## 2013-04-04 NOTE — Progress Notes (Signed)
ANTICOAGULATION CONSULT NOTE - Follow Up Consult  Pharmacy Consult for Coumadin Indication: VTE prophylaxis s/p L THA   No Known Allergies  Patient Measurements:   Heparin Dosing Weight:   Vital Signs: Temp: 97.8 F (36.6 C) (11/19 0629) BP: 108/67 mmHg (11/19 0629) Pulse Rate: 85 (11/19 0629)  Labs:  Recent Labs  04/04/13 0444  HGB 11.4*  HCT 32.6*  PLT 213  LABPROT 14.5  INR 1.15    The CrCl is unknown because both a height and weight (above a minimum accepted value) are required for this calculation.  Assessment: 63 yo M s/p left THA (04/03/13)  PMH: Anxiety/Depression, HTN, COPD (on 2L home O2), CAD, DM, HLD, GERD  Anticoagulation - Coumadin for VTE px; baseline INR 1.02; Coumadin pts = 7. INR up to 1.15. Hgb down from 16.1 to 11.4. Plts 277 now 213.  Infectious Disease: Ancef post-op completed. Afebrile. WBC 7.6  Cardiovascular: hx CAD, HTN, HLD. VSS - on Amlo, Atenolol, Plavix, Zocor  Endocrinology: hx DM  On metformin. CBG 97-163. Scr 0.78  Gastrointestinal / Nutrition: Docusate  Neurology: Remeron, Celebrex  Nephrology: SCr 0.78  Pulmonary: Spiriva  PTA Medication Issues: CoQ10, Fish Oil, MV, BComplex, Testosterone gel  Best Practices: Coumadin  Goal of Therapy:  INR 2-3 Monitor platelets by anticoagulation protocol: Yes   Plan:  Coumadin 6mg  po x 1 tonight  Willo Yoon S. Merilynn Finland, PharmD, BCPS Clinical Staff Pharmacist Pager [phone]  Misty Stanley Stillinger 04/04/2013,2:23 PM

## 2013-04-04 NOTE — Op Note (Signed)
[name], [name] NO.:  [id number]  MEDICAL RECORD NO.:  [id number]  LOCATION:  [id number]                        FACILITY:  [facility]  PHYSICIAN:  Myrtie Neither, MD      DATE OF BIRTH:  11/14/1950  DATE OF PROCEDURE:  04/03/2013 DATE OF DISCHARGE:                              OPERATIVE REPORT   PREOPERATIVE DIAGNOSIS:  Degenerative joint disease, left hip.  POSTOPERATIVE DIAGNOSIS:  Degenerative joint disease, left hip.  ANESTHESIA:  General.  PROCEDURE:  Left total hip arthroplasty, Biomet implant.  DESCRIPTION OF PROCEDURE:  The patient was taken to the operating room. After given adequate preop medications, given general anesthesia and intubated.  The patient was placed in left lateral position.  Left hip was prepped with DuraPrep, draped in sterile manner.  Posterior Southern approach made on the left hip going through the skin and subcutaneous tissue, and down to the fascia and to the gluteal.  Hip was flexed and internally rotated.  Short rotators identified and released.  Capsule was identified and resected.  The hip was dislocated.  Femoral neck cutting jig was put in place and marking of the femoral neck was done. Femoral head was resected.  The proximal femur was retracted and the waist of the acetabulum could be visualized, soft tissue resection as well as osteophytes about the acetabulum was resected.  The femoral head was measured at 56 that was removed.  Reaming of the acetabulum was started 50 mm and proceeded up to 55 mm.  Reaming was done down to good bleeding surface.  Trial implant was put in place and found to seat quite well with good snug fit.  A three-hole porous-coated acetabular shell, 56 mm was put in place was approximately 13 degrees of anteversion and was found to seat quite well with a size fit with good contact to the bony surface.  Trial acetabular 10 mm liner was then put in place.  Next, attention was turned to the proximal  femur, reaming down to the femoral canal, was started with a cookie cutter and proceeded with a proximal reamer as well as reaming by hand to allow further lateralization of the cut.  Trial rasp was used and then rasping was done up to 12.5 mm, which was found to be in very good bone-to-bone contact and filling proximally.  Standard sized head was too tight and too long, -6 mm head was selected, 36 mm with the trial of rasp and head with a standard neck.  Range of motion was full flexion, full extension, good leg length, very stable, negative toggle test.  No subluxing of the femoral head.  X-ray was taken and found to have good placement of the acetabulum.  The rasp was found, needed to be further laterally and was removed and started with a 9,10, and back up to 12.5 laterally rasping for the lateral ramus stent more sexually down to canal.  Again with the trial implants in place, range of motion was good with good leg lengths and no subluxing of the head.  The final implant was 12.5 mm porous- coated stem, -6 mm head, 36 mm acetabular shell with a 3-hole porous- coated 55 mm.  Two acetabular screws were also added for further stability and a liner with a 10 mm liner.  Copious irrigation was done. Hemostasis obtained.  Range of motion again with final implants in place was found to be good, stable, no subluxing of the head and good leg length.  Wound closure was then done with the use of 0 Vicryl for the fascia, 2-0 for the subcutaneous and skin staples for the skin.  Compressive dressing was applied.  Hip abduction brace was put in place.  The patient tolerated the procedure quite well, went to the recovery room in stable and satisfactory condition.     Myrtie Neither, MD     AC/MEDQ  D:  04/03/2013  T:  04/04/2013  Job:  [id number]

## 2013-04-04 NOTE — Progress Notes (Signed)
Subjective: Doing okay, pain not bad.   Objective: Vital signs in last 24 hours: Temp:  [97.3 F (36.3 C)-98.7 F (37.1 C)] 97.8 F (36.6 C) (11/19 0629) Pulse Rate:  [65-87] 85 (11/19 0629) Resp:  [12-24] 18 (11/19 0629) BP: (106-124)/(65-80) 108/67 mmHg (11/19 0629) SpO2:  [93 %-100 %] 99 % (11/19 0629)  Intake/Output from previous day: 11/18 0701 - 11/19 0700 In: 3555 [P.O.:690; I.V.:2815; IV Piggyback:50] Out: 900 [Urine:750; Blood:150] Intake/Output this shift: Total I/O In: -  Out: 50 [Urine:50]   Recent Labs  04/04/13 0444  HGB 11.4*    Recent Labs  04/04/13 0444  WBC 7.6  RBC 3.87*  HCT 32.6*  PLT 213   No results found for this basename: NA, K, CL, CO2, BUN, CREATININE, GLUCOSE, CALCIUM,  in the last 72 hours  Recent Labs  04/04/13 0444  INR 1.15    Neurovascular intact Dorsiflexion/Plantar flexion intact  Assessment/Plan: START PT ,50%WT-BEARING LEFT LEG   [name] F 04/04/2013, 10:22 AM

## 2013-04-05 ENCOUNTER — Other Ambulatory Visit: Payer: Self-pay | Admitting: Orthopedic Surgery

## 2013-04-05 LAB — PROTIME-INR
INR: 1.75 — ABNORMAL HIGH (ref 0.00–1.49)
Prothrombin Time: 19.9 seconds — ABNORMAL HIGH (ref 11.6–15.2)

## 2013-04-05 LAB — CBC
HCT: 32.8 % — ABNORMAL LOW (ref 39.0–52.0)
Hemoglobin: 11.6 g/dL — ABNORMAL LOW (ref 13.0–17.0)
MCV: 84.1 fL (ref 78.0–100.0)
Platelets: 209 10*3/uL (ref 150–400)
RBC: 3.9 MIL/uL — ABNORMAL LOW (ref 4.22–5.81)
WBC: 9.5 10*3/uL (ref 4.0–10.5)

## 2013-04-05 LAB — GLUCOSE, CAPILLARY
Glucose-Capillary: 101 mg/dL — ABNORMAL HIGH (ref 70–99)
Glucose-Capillary: 131 mg/dL — ABNORMAL HIGH (ref 70–99)
Glucose-Capillary: 104 mg/dL — ABNORMAL HIGH (ref 70–99)

## 2013-04-05 MED ORDER — WARFARIN SODIUM 4 MG PO TABS
4.0000 mg | ORAL_TABLET | Freq: Once | ORAL | Status: AC
  Administered 2013-04-05: 4 mg via ORAL
  Filled 2013-04-05: qty 1

## 2013-04-05 NOTE — Evaluation (Signed)
Occupational Therapy Evaluation Patient Details Name: DEVAUN HERNANDEZ MRN: [id number] DOB: 07-13-1950 Today's Date: 04/05/2013 Time: 1105-1130 OT Time Calculation (min): 25 min  OT Assessment / Plan / Recommendation History of present illness s/p elective Lt THA    Clinical Impression   Pt s/p elective L THA. Pt w/previous R THA. OT necessary to maximize pt function, safety & (I) prior to anticipated d/c home w/wife. If pt able to achieve safe Mod (I) level w/LB ADLs, toileting, mobility & txfrs then HHOT not indicated; however, due to pt's COPD and current fx'l level HHOT may be needed. Will cont to assess.    OT Assessment  Patient needs continued OT Services    Follow Up Recommendations  Supervision/Assistance - 24 hour;Home health OT    Barriers to Discharge      Equipment Recommendations  Other (comment) (pt has necessary DME)    Recommendations for Other Services    Frequency  Min 2X/week    Precautions / Restrictions Precautions Precautions: Posterior Hip;Fall Precaution Booklet Issued: Yes (comment) Precaution Comments: Pt verbally recalled 2/3 precautions. Precautions reviewed today. Pt requires cueing to reinforce hip precautions with bed mobility & sit>stand transfers.   Restrictions Weight Bearing Restrictions: Yes RLE Weight Bearing: Weight bearing as tolerated   Pertinent Vitals/Pain 5/10    ADL  Upper Body Dressing: Simulated;Independent;Set up Where Assessed - Upper Body Dressing: Unsupported sitting Lower Body Dressing: Performed;Minimal assistance Where Assessed - Lower Body Dressing: Unsupported sitting (edge of recliner) ADL Comments: pt practiced w/reacher & sockaide. Ed re: hip kit    OT Diagnosis: Acute pain  OT Problem List: Decreased activity tolerance;Decreased knowledge of use of DME or AE;Decreased knowledge of precautions;Pain OT Treatment Interventions: Self-care/ADL training;Therapeutic activities;Patient/family education;DME and/or AE  instruction;Energy conservation   OT Goals(Current goals can be found in the care plan section) Acute Rehab OT Goals Patient Stated Goal: to go home with wife when ready OT Goal Formulation: With patient/family Time For Goal Achievement: 04/09/13 Potential to Achieve Goals: Good ADL Goals Pt Will Perform Lower Body Dressing: with modified independence;with adaptive equipment Pt Will Transfer to Toilet: with modified independence;stand pivot transfer;bedside commode (maintaining hip precautions) Pt Will Perform Toileting - Clothing Manipulation and hygiene: with modified independence  Visit Information  Last OT Received On: 04/05/13 Assistance Needed: +1 History of Present Illness: s/p elective Lt THA        Prior Functioning     Home Living Family/patient expects to be discharged to:: Private residence Living Arrangements: Spouse/significant other Available Help at Discharge: Family;Available 24 hours/day Type of Home: House Home Access: Stairs to enter [employer] of Steps: 3 Entrance Stairs-Rails: Can reach both;Right;Left Home Layout: One level Home Equipment: Walker - 2 wheels;Bedside commode;Shower seat Prior Function Level of Independence: Independent Comments: on his 'bad days' his wife will help dress his  LEs for dressing and bathing  Communication Communication: No difficulties Dominant Hand: Right         Vision/Perception     Cognition  Cognition Arousal/Alertness: Awake/alert Behavior During Therapy: WFL for tasks assessed/performed Overall Cognitive Status: Within Functional Limits for tasks assessed    Extremity/Trunk Assessment Upper Extremity Assessment Upper Extremity Assessment: Overall WFL for tasks assessed Lower Extremity Assessment Lower Extremity Assessment: Defer to PT evaluation Cervical / Trunk Assessment Cervical / Trunk Assessment: Normal     Mobility Bed Mobility Bed Mobility: Supine to Sit;Sitting - Scoot to Edge  of Bed Supine to Sit: With rails;HOB elevated;4: Min guard Sitting - Scoot to [place]  of Bed: 4: Min guard Details for Bed Mobility Assistance: Cues for hip precautions & technique.  Less effortful today. Pt stated that he has a rail to use at home to assist with supine->sit Transfers Sit to Stand: 4: Min assist;4: Min guard Stand to Sit: 4: Min assist Details for Transfer Assistance: vc's for hand placement and cues for technique.  Pt has most difficulty at halfway point when standing and advancing UEs to RW. Pt educated on PLB steadying himself prior to ambulation; educated on pacing.  Cues for erect posture. Cues for slowing descent to chair with sitting and to prevent Internal rotation. Sit<>stand x5; fewer cues required as continued.         Exercise General Exercises - Lower Extremity Ankle Circles/Pumps: AROM;Both;10 reps Heel Slides: AROM;Left;10 reps Hip ABduction/ADduction: AROM;Left;10 reps   Balance     End of Session    GO     Kail Fraley, Deidre Ala 04/05/2013, 12:22 PM

## 2013-04-05 NOTE — Progress Notes (Signed)
Subjective: 2 Days Post-Op Procedure(s) (LRB): LEFT TOTAL HIP ARTHROPLASTY (Left) Patient reports pain as 5 on 0-10 scale.    Objective: Vital signs in last 24 hours: Temp:  [97.8 F (36.6 C)-98.6 F (37 C)] 98.6 F (37 C) (11/20 1352) Pulse Rate:  [80-94] 80 (11/20 1352) Resp:  [18-20] 18 (11/20 1352) BP: (114-121)/(65-70) 114/70 mmHg (11/20 1352) SpO2:  [93 %-98 %] 95 % (11/20 1352)  Intake/Output from previous day: 11/19 0701 - 11/20 0700 In: 1350 [P.O.:600; I.V.:750] Out: 1575 [Urine:1575] Intake/Output this shift: Total I/O In: 480 [P.O.:480] Out: 600 [Urine:600]   Recent Labs  04/04/13 0444 04/05/13 0352  HGB 11.4* 11.6*    Recent Labs  04/04/13 0444 04/05/13 0352  WBC 7.6 9.5  RBC 3.87* 3.90*  HCT 32.6* 32.8*  PLT 213 209   No results found for this basename: NA, K, CL, CO2, BUN, CREATININE, GLUCOSE, CALCIUM,  in the last 72 hours  Recent Labs  04/04/13 0444 04/05/13 0352  INR 1.15 1.75*    Neurovascular intact Intact pulses distally Dorsiflexion/Plantar flexion intact  Assessment/Plan: 2 Days Post-Op Procedure(s) (LRB): LEFT TOTAL HIP ARTHROPLASTY (Left) Up with therapy Plan for discharge tomorrow Discharge home with home health  Kennieth Rad 04/05/2013, 5:30 PM

## 2013-04-05 NOTE — Progress Notes (Signed)
[name], [place] [phone] 04/05/2013

## 2013-04-05 NOTE — Progress Notes (Signed)
Physical Therapy Treatment Patient Details Name: [name] MRN: [id number] DOB: [birth year] Today's Date: 04/05/2013 Time: 6295-2841 PT Time Calculation (min): 43 min  PT Assessment / Plan / Recommendation  History of Present Illness s/p elective Lt THA    PT Comments   Pt remains limited due to COPD.  Pt has low activity tolerance with all mobility exercises.  Pt on 3L O2 with ambulation and became SOB requiring seated rest break; SaO2 96%.  Pt educated on sit<>stand technique today and improved with practice requiring fewer cues for safe technique to maintain precautions.  Pt stated that 40-50' is further distance he will have to ambulate at home; thus will attempt prior to d/c.  Also recommended to attempt stair training prior to d/c home.   Follow Up Recommendations  Home health PT;Supervision/Assistance - 24 hour     Does the patient have the potential to tolerate intense rehabilitation     Barriers to Discharge        Equipment Recommendations       Recommendations for Other Services OT consult  Frequency 7X/week   Progress towards PT Goals Progress towards PT goals: Progressing toward goals  Plan Current plan remains appropriate    Precautions / Restrictions Precautions Precautions: Posterior Hip;Fall Precaution Booklet Issued: Yes (comment) Precaution Comments: Pt verbally recalled 2/3 precautions. Precautions reviewed today. Pt requires cueing to reinforce hip precautions with bed mobility & sit>stand transfers.   Restrictions Weight Bearing Restrictions: Yes RLE Weight Bearing: Weight bearing as tolerated   Pertinent Vitals/Pain SOB is most limiting factor SaO2 96% with rest break through ambulation.  6/10 L hip pain, repositioned in chair for comfort.    Mobility  Bed Mobility Bed Mobility: Supine to Sit;Sitting - Scoot to Edge of Bed Supine to Sit: With rails;HOB elevated;4: Min guard Sitting - Scoot to [place] of Bed: 4: Min guard Details for Bed Mobility  Assistance: Cues for hip precautions & technique.  Less effortful today. Pt stated that he has a rail to use at home to assist with supine->sit Transfers Transfers: Sit to Stand;Stand to Sit Sit to Stand: 4: Min assist;4: Min guard Stand to Sit: 4: Min assist Details for Transfer Assistance: vc's for hand placement and cues for technique.  Pt has most difficulty at halfway point when standing and advancing UEs to RW. Pt educated on PLB steadying himself prior to ambulation; educated on pacing.  Cues for erect posture. Cues for slowing descent to chair with sitting and to prevent Internal rotation. Sit<>stand x5; fewer cues required as continued.     Ambulation/Gait Ambulation/Gait Assistance: 4: Min guard Ambulation Distance (Feet): 20 Feet Assistive device: Rolling walker Ambulation/Gait Assistance Details: vc's for erect posture and pacing; pt demonstrated improved rw use today Gait Pattern: Step-to pattern Gait velocity: decreased General Gait Details: becomes very SOB; on 3L 02 requiring seated rest break.  Stairs: No    Exercises General Exercises - Lower Extremity Ankle Circles/Pumps: AROM;Both;10 reps Heel Slides: AROM;Left;10 reps Hip ABduction/ADduction: AROM;Left;10 reps   PT Diagnosis:    PT Problem List:   PT Treatment Interventions:     PT Goals (current goals can now be found in the care plan section)    Visit Information  Last PT Received On: 04/05/13 Assistance Needed: +1 History of Present Illness: s/p elective Lt THA     Subjective Data      Cognition  Cognition Arousal/Alertness: Awake/alert Behavior During Therapy: WFL for tasks assessed/performed Overall Cognitive Status: Within Functional Limits for tasks  assessed    Balance     End of Session PT - End of Session Equipment Utilized During Treatment: Oxygen;Gait belt Activity Tolerance: Patient limited by fatigue Patient left: in chair;with call bell/phone within reach;with family/visitor  present Nurse Communication: Mobility status   GP     Ernestina Columbia, SPTA 04/05/2013, 11:07 AM

## 2013-04-05 NOTE — Progress Notes (Addendum)
ANTICOAGULATION CONSULT NOTE - Follow Up Consult  Pharmacy Consult for Coumadin Indication: VTE prophylaxis s/p L THA   No Known Allergies  Patient Measurements: Height: 5\' 10"  (177.8 cm) Weight: 182 lb (82.555 kg) IBW/kg (Calculated) : 73 Heparin Dosing Weight:   Vital Signs: Temp: 98.6 F (37 C) (11/20 1352) Temp src: Oral (11/20 1352) BP: 114/70 mmHg (11/20 1352) Pulse Rate: 80 (11/20 1352)  Labs:  Recent Labs  04/04/13 0444 04/05/13 0352  HGB 11.4* 11.6*  HCT 32.6* 32.8*  PLT 213 209  LABPROT 14.5 19.9*  INR 1.15 1.75*    Estimated Creatinine Clearance: 97.6 ml/min (by C-G formula based on Cr of 0.78).  Assessment: 62 yo M s/p left THA (04/03/13)  PMH: Anxiety/Depression, HTN, COPD (on 2L home O2), CAD, DM, HLD, GERD  Anticoagulation/Heme - Coumadin for VTE px; baseline INR 1.02; Coumadin pts = 7. INR 1.75 up from 1.15. Hgb 11.4 <11.6 <from 16.1; Plts 209 < 213<277. PO Iron tidwc; no bleeding noted. Continues on celecoxib, simvastatin and plavix.  Best Practices: Coumadin, SCDs  Goal of Therapy:  INR 2-3 Monitor platelets by anticoagulation protocol: Yes   Plan:  Coumadin 4 mg po x 1 tonight Daily INR.   Noah Delaine, RPh Clinical Pharmacist Pager: [phone] 04/05/2013,2:51 PM

## 2013-04-05 NOTE — Progress Notes (Signed)
Physical Therapy Treatment Patient Details Name: [name] MRN: [id number] DOB: 05-17-1951 Today's Date: 04/05/2013 Time: 4098-1191 PT Time Calculation (min): 16 min  PT Assessment / Plan / Recommendation  History of Present Illness s/p elective Lt THA    PT Comments   Pt making progress with mobility but limited by SOB 2/2 COPD.  Pt ambulated ~40' x 2 & he states this distance is adequate for home.  Pt needs to practice steps before d/c home.     Follow Up Recommendations  Home health PT;Supervision/Assistance - 24 hour     Does the patient have the potential to tolerate intense rehabilitation     Barriers to Discharge        Equipment Recommendations  None recommended by PT    Recommendations for Other Services OT consult  Frequency 7X/week   Progress towards PT Goals Progress towards PT goals: Progressing toward goals  Plan Current plan remains appropriate    Precautions / Restrictions Precautions Precautions: Posterior Hip;Fall Precaution Booklet Issued: Yes (comment) Precaution Comments: Pt verbally recalled 2/3 precautions. Precautions reviewed today. Pt requires cueing to reinforce hip precautions with bed mobility & sit>stand transfers.   Restrictions Weight Bearing Restrictions: Yes RLE Weight Bearing: Weight bearing as tolerated       Mobility  Bed Mobility Bed Mobility: Sit to Supine Supine to Sit: With rails;HOB elevated;4: Min guard Sitting - Scoot to Edge of Bed: 4: Min guard Sit to Supine: 4: Min assist;HOB flat Details for Bed Mobility Assistance: (A) to lift LE's back into bed.  Cues for technique Transfers Transfers: Sit to Stand;Stand to Sit Sit to Stand: 4: Min assist;With upper extremity assist;With armrests;From bed;From chair/3-in-1 Stand to Sit: 4: Min assist;With upper extremity assist;With armrests;To chair/3-in-1;To bed Details for Transfer Assistance: cues for RLE positioning before sitting.  Pt cont's to have difficulty shifting  weight anteriorly & completing sit>stand as he had to grab rail in hallway for stabilization to finish pulling himself into standing Ambulation/Gait Ambulation/Gait Assistance: 4: Min guard Ambulation Distance (Feet): 40 Feet (2x's) Assistive device: Rolling walker Ambulation/Gait Assistance Details: cues for increased step length LLE & to increase floor clearance LLE.   Gait Pattern: Step-to pattern;Step-through pattern;Decreased weight shift to right;Antalgic Gait velocity: decreased General Gait Details: becomes very SOB; on 3L 02 requiring seated rest break.  Stairs: No Wheelchair Mobility Wheelchair Mobility: No          PT Goals (current goals can now be found in the care plan section) Acute Rehab PT Goals Patient Stated Goal: to go home with wife when ready PT Goal Formulation: With patient Time For Goal Achievement: 04/10/13 Potential to Achieve Goals: Good  Visit Information  Last PT Received On: 04/05/13 Assistance Needed: +1 History of Present Illness: s/p elective Lt THA     Subjective Data  Patient Stated Goal: to go home with wife when ready   Cognition  Cognition Arousal/Alertness: Awake/alert Behavior During Therapy: WFL for tasks assessed/performed Overall Cognitive Status: Within Functional Limits for tasks assessed    Balance     End of Session PT - End of Session Equipment Utilized During Treatment: Gait belt;Oxygen Activity Tolerance: Patient tolerated treatment well Patient left: in bed;with call bell/phone within reach;with family/visitor present Nurse Communication: Mobility status   GP     [name] 04/05/2013, 2:46 PM   [name], PTA [phone] 04/05/2013

## 2013-04-06 ENCOUNTER — Encounter (HOSPITAL_COMMUNITY): Payer: Self-pay | Admitting: Orthopedic Surgery

## 2013-04-06 LAB — CBC
HCT: 30.7 % — ABNORMAL LOW (ref 39.0–52.0)
MCHC: 35.8 g/dL (ref 30.0–36.0)
Platelets: 212 10*3/uL (ref 150–400)
RDW: 14 % (ref 11.5–15.5)
WBC: 8.7 10*3/uL (ref 4.0–10.5)

## 2013-04-06 LAB — GLUCOSE, CAPILLARY: Glucose-Capillary: 100 mg/dL — ABNORMAL HIGH (ref 70–99)

## 2013-04-06 LAB — PROTIME-INR: INR: 1.53 — ABNORMAL HIGH (ref 0.00–1.49)

## 2013-04-06 MED ORDER — WARFARIN SODIUM 1 MG PO TABS: 7.0000 mg | ORAL_TABLET | Freq: Every day | ORAL | Status: DC

## 2013-04-06 MED ORDER — WARFARIN SODIUM 7.5 MG PO TABS
7.5000 mg | ORAL_TABLET | Freq: Once | ORAL | Status: AC
  Administered 2013-04-06: 7.5 mg via ORAL
  Filled 2013-04-06: qty 1

## 2013-04-06 MED ORDER — WARFARIN SODIUM 7.5 MG PO TABS: 7.5000 mg | ORAL_TABLET | Freq: Once | ORAL | Status: DC

## 2013-04-06 MED ORDER — OXYCODONE-ACETAMINOPHEN 7.5-325 MG PO TABS: 1.0000 | ORAL_TABLET | ORAL | Status: DC | PRN

## 2013-04-06 MED ORDER — FERROUS SULFATE 325 (65 FE) MG PO TABS: 325.0000 mg | ORAL_TABLET | Freq: Three times a day (TID) | ORAL | Status: DC

## 2013-04-06 NOTE — Progress Notes (Signed)
Physical Therapy Treatment Patient Details Name: [name] MRN: [id number] DOB: March 15, 1951 Today's Date: 04/06/2013 Time: 4098-1191 PT Time Calculation (min): 23 min  PT Assessment / Plan / Recommendation  History of Present Illness s/p elective Lt THA    PT Comments   Pt making progress with mobility & PT goals.  Fatigues easily & with SOB/labored breathing with minimal activity due to COPD.  Pt able to complete stair training this session.  Educated pt on importance of pacing himself with activities/energy conservation technique.  Pt verbalized understanding.     Follow Up Recommendations  Home health PT;Supervision/Assistance - 24 hour     Does the patient have the potential to tolerate intense rehabilitation     Barriers to Discharge        Equipment Recommendations  None recommended by PT    Recommendations for Other Services OT consult  Frequency 7X/week   Progress towards PT Goals Progress towards PT goals: Progressing toward goals  Plan Current plan remains appropriate    Precautions / Restrictions Precautions Precautions: Posterior Hip;Fall Precaution Comments: pt able to recall 3/3 hip precautions Restrictions RLE Weight Bearing: Weight bearing as tolerated LLE Weight Bearing: Weight bearing as tolerated       Mobility  Bed Mobility Bed Mobility: Not assessed Transfers Transfers: Sit to Stand;Stand to Sit Sit to Stand: 4: Min guard;With upper extremity assist;With armrests;From chair/3-in-1 Stand to Sit: 4: Min guard;With upper extremity assist;With armrests;To chair/3-in-1 Details for Transfer Assistance: Cont's to have some difficulty from midpoint to complete standing.  Cues to extend LLE before sitting Ambulation/Gait Ambulation/Gait Assistance: 4: Min guard Ambulation Distance (Feet): 30 Feet Assistive device: Rolling walker Ambulation/Gait Assistance Details: Cues for pursed lip breathing, heel strike LLE, & encouragement to decrease reliance  of UE's on RW.   Gait Pattern: Step-to pattern General Gait Details: SOB due to COPD limits distance.  Requires multiple standing rest breaks Stairs: Yes Stairs Assistance: 4: Min guard Stairs Assistance Details (indicate cue type and reason): cues for sequencing & technique.  Pt states he can enter through front of house which has 2 steps + bil rails.   Stair Management Technique: Two rails;Step to pattern;Forwards Number of Stairs: 3 (2x's) Wheelchair Mobility Wheelchair Mobility: No      PT Goals (current goals can now be found in the care plan section) Acute Rehab PT Goals Patient Stated Goal: to go home with wife when ready PT Goal Formulation: With patient Time For Goal Achievement: 04/10/13 Potential to Achieve Goals: Good  Visit Information  Last PT Received On: 04/06/13 Assistance Needed: +1 History of Present Illness: s/p elective Lt THA     Subjective Data  Patient Stated Goal: to go home with wife when ready   Cognition  Cognition Arousal/Alertness: Awake/alert Behavior During Therapy: WFL for tasks assessed/performed Overall Cognitive Status: Within Functional Limits for tasks assessed    Balance  Balance Balance Assessed: Yes Dynamic Sitting Balance Dynamic Sitting - Balance Support: No upper extremity supported;Feet unsupported Dynamic Sitting - Level of Assistance: 5: Stand by assistance Dynamic Standing Balance Dynamic Standing - Balance Support: Bilateral upper extremity supported;During functional activity Dynamic Standing - Level of Assistance: 4: Min assist;Other (comment) (min guard A)  End of Session PT - End of Session Equipment Utilized During Treatment: Gait belt;Oxygen (3L 02) Activity Tolerance: Patient tolerated treatment well Patient left: in chair;with call bell/phone within reach   GP     Lara Mulch 04/06/2013, 1:19 PM  [name], PTA [phone] 04/06/2013

## 2013-04-06 NOTE — Progress Notes (Signed)
Occupational Therapy Treatment Patient Details Name: [name] MRN: [id number] DOB: 08/03/1950 Today's Date: 04/06/2013 Time: 4098-1191 OT Time Calculation (min): 11 min  OT Assessment / Plan / Recommendation  History of present illness s/p elective Lt THA    OT comments  Pt making good progress with functional goals. Pt fatigues easily with SOB and labored breathing due to hx of COPD. Pt to continue with acute OT services to address impairments to help restore PLOF to return home safely  Follow Up Recommendations  Supervision/Assistance - 24 hour;Home health OT    Barriers to Discharge   none    Equipment Recommendations  None recommended by OT    Recommendations for Other Services    Frequency Min 2X/week   Progress towards OT Goals Progress towards OT goals: Progressing toward goals  Plan Discharge plan remains appropriate    Precautions / Restrictions Precautions Precautions: Posterior Hip;Fall Precaution Comments: pt able to recall 3/3 hip precautions Restrictions RLE Weight Bearing: Weight bearing as tolerated LLE Weight Bearing: Weight bearing as tolerated   Pertinent Vitals/Pain 2/10    ADL  Toilet Transfer: Performed;Min guard;Minimal assistance Toilet Transfer Method: Sit to Barista: Materials engineer and Hygiene: Simulated;Min guard Where Assessed - Glass blower/designer Manipulation and Hygiene: Standing ADL Comments: Reviewed all A/E with pt    OT Diagnosis:    OT Problem List:   OT Treatment Interventions:     OT Goals(current goals can now be found in the care plan section) Acute Rehab OT Goals Patient Stated Goal: to go home with wife when ready  Visit Information  Last OT Received On: 04/06/13 Assistance Needed: +1 History of Present Illness: s/p elective Lt THA     Subjective Data      Prior Functioning       Cognition  Cognition Arousal/Alertness: Awake/alert Behavior  During Therapy: WFL for tasks assessed/performed Overall Cognitive Status: Within Functional Limits for tasks assessed    Mobility  Bed Mobility Bed Mobility: Not assessed Transfers Sit to Stand: 4: Min guard;From chair/3-in-1;With armrests (to [facility] ambulating with RW) Stand to Sit: 4: Min guard;To chair/3-in-1;With armrests    Exercises      Balance Balance Balance Assessed: Yes Dynamic Sitting Balance Dynamic Sitting - Balance Support: No upper extremity supported;Feet unsupported Dynamic Sitting - Level of Assistance: 5: Stand by assistance Dynamic Standing Balance Dynamic Standing - Balance Support: Bilateral upper extremity supported;During functional activity Dynamic Standing - Level of Assistance: 4: Min assist;Other (comment) (min guard A)   End of Session OT - End of Session Equipment Utilized During Treatment: Rolling walker;Gait belt Activity Tolerance: Patient limited by fatigue Patient left: Other (comment) (ambulating with PT)  GO     [name] 04/06/2013, 12:13 PM

## 2013-04-06 NOTE — Progress Notes (Signed)
ANTICOAGULATION CONSULT NOTE - Follow Up Consult  Pharmacy Consult for Coumadin Indication: VTE prophylaxis s/p left THA  No Known Allergies  Patient Measurements: Height: 5\' 10"  (177.8 cm) Weight: 182 lb (82.555 kg) IBW/kg (Calculated) : 73  Vital Signs: Temp: 97.4 F (36.3 C) (11/21 0555) Temp src: Oral (11/21 0555) BP: 113/79 mmHg (11/21 0555) Pulse Rate: 84 (11/21 0555)  Labs:  Recent Labs  04/04/13 0444 04/05/13 0352 04/06/13 0520  HGB 11.4* 11.6* 11.0*  HCT 32.6* 32.8* 30.7*  PLT 213 209 212  LABPROT 14.5 19.9* 18.0*  INR 1.15 1.75* 1.53*    Estimated Creatinine Clearance: 97.6 ml/min (by C-G formula based on Cr of 0.78).  Assessment:    INR is subtherapeutic, down some after decreasing Coumadin dose yesterday.  Has had 7.5 mg, 6 mg, then 4 mg of Coumadin.  Expecting discharge today. We discussed Coumadin use, monitoring, precautions, and potential drug-drug and drug-food interactions. Used Coumadin without problems post-op right hip surgery in Nov. 2013.  Home PT/INRs planned via [facility].     Also on Plavix, as at home. Off ASA 81 mg.  Currently on Celebrex, but would prefer to use Etodolac prn at home if anti-inflammatory needed, due to cost.  Goal of Therapy:  INR 2-3 Monitor platelets by anticoagulation protocol: Yes   Plan:    Coumadin 7.5 mg x 1 today.  Will schedule at 12noon, as discharge expected today.   Would begin Coumadin 5 mg daily on 11/22, and f/u PT/INR next week.   Expect patient will stay off Aspirin while on Coumadin.  (He reports not taking ASA consistently prior to admission.)   Dr. Montez Morita to address anti-inflammatory meds.    Dennie Fetters, [place] Pager: [phone] 04/06/2013,8:28 AM

## 2013-04-07 NOTE — Discharge Summary (Signed)
NAMEDARIK, MASSING NO.:  [id number]  MEDICAL RECORD NO.:  [id number]  LOCATION:  5N28C                        FACILITY:  [facility]  PHYSICIAN:  Myrtie Neither, MD      DATE OF BIRTH:  [birth year]  DATE OF ADMISSION:  04/03/2013 DATE OF DISCHARGE:  04/06/2013                              DISCHARGE SUMMARY   ADMITTING DIAGNOSIS:  Degenerative joint disease, left hip.  DISCHARGE DIAGNOSIS:  Degenerative joint disease, left hip.  COMPLICATIONS:  None.  INFECTIONS:  None.  OPERATION:  Left total hip arthroplasty.  HISTORY:  This is a 62 year old male who had been followed for degenerative joint disease of bilateral hips.  The patient had previous right total hip arthroplasty over a year ago, and plan is presently returned for same procedure of his left hip because of recurring and progressively worsening left hip pain, reduction of function with difficulty walking short and long distances.  Pertinent physical was that of the left hip.  Range of motion is good but limited on rotation. Shortening on the left side palpable and audible crepitus anterolaterally.  Neurovascular status is intact.  X-rays revealed sclerosis, loss of joint space, and osteophytic formation of the left acetabulum.  HOSPITAL COURSE:  The patient had initially seen his cardiologist before scheduling surgery and was advised to proceed with having his hip replacement done.  The patient had CBC, EKG, chest x-ray, PT, PTT, INR, UA, CMET.  [name] labs were found to be stable enough to undergo surgery.  The patient underwent left total hip arthroplasty on April 04, 2013, tolerated the procedure quite well.  Postoperative course has been fairly benign.  Partial weightbearing 50% on the left side.  The patient has had physical therapy and occupational therapy.  The patient has remained afebrile.  H and H has been stable.  Pain had been brought under control with use of Percocet 1-2 q.4  p.r.n. for pain.  Presently the patient is stable enough to be discharged home with home health and physical therapy arranged as an outpatient.  Return to the office in 1 week, 50% weightbearing on the left side.  The patient is to continue with ferrous sulfate 325 mg t.i.d., Coumadin 7.5 mg daily. home health nurse check INR and monitoring Coumadin, physical therapy, partial weightbearing on the left side 50%.  [name] pain is controlled, stable.  The patient is discharged in stable and satisfactory condition.     Myrtie Neither, MD    AC/MEDQ  D:  04/06/2013  T:  04/07/2013  Job:  [id number]

## 2013-04-16 ENCOUNTER — Ambulatory Visit (HOSPITAL_COMMUNITY): Payer: Self-pay | Admitting: Psychiatry

## 2013-04-17 ENCOUNTER — Ambulatory Visit (HOSPITAL_COMMUNITY): Payer: Self-pay | Admitting: Psychiatry

## 2013-04-22 ENCOUNTER — Other Ambulatory Visit (HOSPITAL_COMMUNITY): Payer: Self-pay | Admitting: Psychiatry

## 2013-04-22 DIAGNOSIS — F329 Major depressive disorder, single episode, unspecified: Secondary | ICD-10-CM

## 2013-05-01 ENCOUNTER — Ambulatory Visit (INDEPENDENT_AMBULATORY_CARE_PROVIDER_SITE_OTHER): Payer: Medicare Other | Admitting: Internal Medicine

## 2013-05-01 ENCOUNTER — Ambulatory Visit (INDEPENDENT_AMBULATORY_CARE_PROVIDER_SITE_OTHER)
Admission: RE | Admit: 2013-05-01 | Discharge: 2013-05-01 | Disposition: A | Discharge status: Home / Self Care | Payer: Medicare Other | Source: Ambulatory Visit | Attending: Internal Medicine | Admitting: Internal Medicine

## 2013-05-01 ENCOUNTER — Other Ambulatory Visit (INDEPENDENT_AMBULATORY_CARE_PROVIDER_SITE_OTHER): Payer: Medicare Other

## 2013-05-01 ENCOUNTER — Encounter: Payer: Self-pay | Admitting: Internal Medicine

## 2013-05-01 VITALS — BP 108/86 | HR 94 | Temp 98.7°F | Ht 70.0 in | Wt 183.0 lb

## 2013-05-01 DIAGNOSIS — R0609 Other forms of dyspnea: Secondary | ICD-10-CM

## 2013-05-01 DIAGNOSIS — F17200 Nicotine dependence, unspecified, uncomplicated: Secondary | ICD-10-CM

## 2013-05-01 DIAGNOSIS — R0600 Dyspnea, unspecified: Secondary | ICD-10-CM

## 2013-05-01 DIAGNOSIS — J449 Chronic obstructive pulmonary disease, unspecified: Secondary | ICD-10-CM

## 2013-05-01 DIAGNOSIS — J9611 Chronic respiratory failure with hypoxia: Secondary | ICD-10-CM | POA: Insufficient documentation

## 2013-05-01 LAB — CBC WITH DIFFERENTIAL/PLATELET
Basophils Relative: 0.9 % (ref 0.0–3.0)
Eosinophils Absolute: 0.7 10*3/uL (ref 0.0–0.7)
Eosinophils Relative: 8.9 % — ABNORMAL HIGH (ref 0.0–5.0)
Hemoglobin: 13.1 g/dL (ref 13.0–17.0)
Lymphocytes Relative: 28.2 % (ref 12.0–46.0)
Lymphs Abs: 2.3 10*3/uL (ref 0.7–4.0)
MCHC: 33.1 g/dL (ref 30.0–36.0)
MCV: 88.2 fl (ref 78.0–100.0)
Neutro Abs: 4.4 10*3/uL (ref 1.4–7.7)
RBC: 4.5 Mil/uL (ref 4.22–5.81)
WBC: 8.3 10*3/uL (ref 4.5–10.5)

## 2013-05-01 LAB — BASIC METABOLIC PANEL
BUN: 16 mg/dL (ref 6–23)
CO2: 23 mEq/L (ref 19–32)
Calcium: 10 mg/dL (ref 8.4–10.5)
Chloride: 110 mEq/L (ref 96–112)
Creatinine, Ser: 0.8 mg/dL (ref 0.4–1.5)
GFR: 127.17 mL/min (ref >60.00)

## 2013-05-01 MED ORDER — BUDESONIDE-FORMOTEROL FUMARATE 160-4.5 MCG/ACT IN AERO: INHALATION_SPRAY | RESPIRATORY_TRACT | Status: DC

## 2013-05-01 NOTE — Progress Notes (Signed)
   Subjective:    Patient ID: [name], male    DOB: 1951-01-03  MRN: [id number]  HPI  55 yobm smoker on disability due to depression since 1994 baseline on spiriva with  doe room to room s 02 then underwent L THR at [facility] and noted even in hosp need 02 with any activity so   referred to pulmonary clinic 05/01/2013 by [facility].   05/01/2013 1st [facility] office visit/ Prima Rayner cc doe x room to room if not on 02 using walker ever since mobilized from surgery 04/03/13 s cough, some chest tightness once or twice per week esp after cigarette use, has not tried inhalers at home (other than spiriva)  but nebs in hosp seemed to help  No obvious day to day or daytime variabilty or assoc   cp or chest tightness, subjective wheeze overt sinus or hb symptoms. No unusual exp hx or h/o childhood pna/ asthma or knowledge of premature birth.  Sleeping ok without nocturnal  or early am exacerbation  of respiratory  c/o's or need for noct saba. Also denies any obvious fluctuation of symptoms with weather or environmental changes or other aggravating or alleviating factors except as outlined above   Current Medications, Allergies, Complete Past Medical History, Past Surgical History, Family History, and Social History were reviewed in [employer] record.  .        Review of Systems  Constitutional: Positive for appetite change. Negative for fever, chills, activity change and unexpected weight change.  HENT: Negative for congestion, dental problem, postnasal drip, rhinorrhea, sneezing, sore throat, trouble swallowing and voice change.   Eyes: Negative for visual disturbance.  Respiratory: Positive for shortness of breath. Negative for cough and choking.   Cardiovascular: Negative for chest pain and leg swelling.  Gastrointestinal: Negative for nausea, vomiting and abdominal pain.  Genitourinary: Negative for difficulty urinating.  Musculoskeletal: Negative for arthralgias.   Skin: Negative for rash.  Psychiatric/Behavioral: Negative for behavioral problems and confusion.       Objective:   Physical Exam  W/c bm nad   Wt Readings from Last 3 Encounters:  05/01/13 183 lb (83.008 kg)  04/04/13 182 lb (82.555 kg)  04/04/13 182 lb (82.555 kg)      HEENT mild turbinate edema.  Oropharynx no thrush or excess pnd or cobblestoning.  No JVD or cervical adenopathy. Mild accessory muscle hypertrophy. Trachea midline, nl thryroid. Chest was hyperinflated by percussion with diminished breath sounds and moderate increased exp time with mid exp wheeze. Hoover sign positive at mid inspiration. Regular rate and rhythm without murmur gallop or rub or increase P2 or edema.  Abd: no hsm, nl excursion. Ext warm without cyanosis or clubbing.    CXR  05/01/2013 : Stable appearance of the chest. No evidence of acute airspace disease.    labs ok x eos 8.9% Assessment & Plan:

## 2013-05-01 NOTE — Patient Instructions (Signed)
Start symbicort 160 Take 2 puffs first thing in am and then another 2 puffs about 12 hours later.   Continue spiriva one in am  Work on inhaler technique:  relax and gently blow all the way out then take a nice smooth deep breath back in, triggering the inhaler at same time you start breathing in.  Hold for up to 5 seconds if you can.  Rinse and gargle with water when done    Please remember to go to the lab and x-ray department downstairs for your tests - we will call you with the results when they are available.  The key is to stop smoking completely before smoking completely stops you!      Please schedule a follow up office visit in 6 weeks, call sooner if needed with pfts

## 2013-05-02 DIAGNOSIS — F17200 Nicotine dependence, unspecified, uncomplicated: Secondary | ICD-10-CM | POA: Insufficient documentation

## 2013-05-02 NOTE — Assessment & Plan Note (Signed)

## 2013-05-02 NOTE — Assessment & Plan Note (Addendum)
DDX of  difficult airways managment all start with A and  include Adherence, Ace Inhibitors, Acid Reflux, Active Sinus Disease, Alpha 1 Antitripsin deficiency, Anxiety masquerading as Airways dz,  ABPA,  allergy(esp in young), Aspiration (esp in elderly), Adverse effects of DPI,  Active smokers, plus two Bs  = Bronchiectasis and Beta blocker use..and one C= CHF  Adherence is always the initial "prime suspect" and is a multilayered concern that requires a "trust but verify" approach in every patient - starting with knowing how to use medications, especially inhalers, correctly, keeping up with refills and understanding the fundamental difference between maintenance and prns vs those medications only taken for a very short course and then stopped and not refilled.  - The proper method of use, as well as anticipated side effects, of a metered-dose inhaler are discussed and demonstrated to the patient. Improved effectiveness after extensive coaching during this visit to a level of approximately  75% so try symbicort 160 2 bid  Active smoking clearly the other prime suspect > discussed separately   Allergy/ asthma note elevated eos on cbc > symbicort best choice for now  ? chf > doubt with BNP << 100  ? PE > doubt since was on full anticoagulation at onset

## 2013-05-02 NOTE — Assessment & Plan Note (Signed)
If stops smoking and used symbicort effectively should be able to wean off 02 per [facility]

## 2013-05-23 ENCOUNTER — Other Ambulatory Visit (HOSPITAL_COMMUNITY): Payer: Self-pay | Admitting: Psychiatry

## 2013-05-23 ENCOUNTER — Ambulatory Visit (HOSPITAL_COMMUNITY): Payer: Self-pay | Admitting: Psychiatry

## 2013-05-24 ENCOUNTER — Other Ambulatory Visit (HOSPITAL_COMMUNITY): Payer: Self-pay | Admitting: Psychiatry

## 2013-06-12 ENCOUNTER — Ambulatory Visit (INDEPENDENT_AMBULATORY_CARE_PROVIDER_SITE_OTHER): Payer: Medicare Other | Admitting: Internal Medicine

## 2013-06-12 ENCOUNTER — Encounter: Payer: Self-pay | Admitting: Internal Medicine

## 2013-06-12 VITALS — BP 120/70 | HR 94 | Temp 97.9°F | Ht 70.0 in | Wt 200.0 lb

## 2013-06-12 DIAGNOSIS — R0609 Other forms of dyspnea: Secondary | ICD-10-CM

## 2013-06-12 DIAGNOSIS — J449 Chronic obstructive pulmonary disease, unspecified: Secondary | ICD-10-CM

## 2013-06-12 DIAGNOSIS — R0600 Dyspnea, unspecified: Secondary | ICD-10-CM

## 2013-06-12 DIAGNOSIS — F17200 Nicotine dependence, unspecified, uncomplicated: Secondary | ICD-10-CM

## 2013-06-12 DIAGNOSIS — R0989 Other specified symptoms and signs involving the circulatory and respiratory systems: Secondary | ICD-10-CM

## 2013-06-12 DIAGNOSIS — J9610 Chronic respiratory failure, unspecified whether with hypoxia or hypercapnia: Secondary | ICD-10-CM

## 2013-06-12 LAB — PULMONARY FUNCTION TEST
DL/VA % pred: 39 %
DL/VA: 1.83 ml/min/mmHg/L
DLCO unc % pred: 29 %
DLCO unc: 9.6 ml/min/mmHg
FEF 25-75 POST: 0.69 L/s
FEF 25-75 Pre: 0.48 L/sec
FEF2575-%Change-Post: 42 %
FEF2575-%Pred-Post: 24 %
FEF2575-%Pred-Pre: 17 %
FEV1-%CHANGE-POST: 14 %
FEV1-%PRED-POST: 43 %
FEV1-%Pred-Pre: 37 %
FEV1-Post: 1.32 L
FEV1-Pre: 1.15 L
FEV1FVC-%CHANGE-POST: 0 %
FEV1FVC-%Pred-Pre: 54 %
FEV6-%Change-Post: 14 %
FEV6-%PRED-PRE: 67 %
FEV6-%Pred-Post: 76 %
FEV6-PRE: 2.55 L
FEV6-Post: 2.91 L
FEV6FVC-%Change-Post: 0 %
FEV6FVC-%PRED-POST: 98 %
FEV6FVC-%PRED-PRE: 98 %
FVC-%Change-Post: 14 %
FVC-%PRED-PRE: 68 %
FVC-%Pred-Post: 77 %
FVC-PRE: 2.69 L
FVC-Post: 3.08 L
POST FEV6/FVC RATIO: 95 %
Post FEV1/FVC ratio: 43 %
Pre FEV1/FVC ratio: 43 %
Pre FEV6/FVC Ratio: 95 %
RV % pred: 206 %
RV: 4.79 L
TLC % PRED: 117 %
TLC: 8.25 L

## 2013-06-12 MED ORDER — ALBUTEROL SULFATE HFA 108 (90 BASE) MCG/ACT IN AERS: 2.0000 | INHALATION_SPRAY | Freq: Four times a day (QID) | RESPIRATORY_TRACT | Status: DC | PRN

## 2013-06-12 MED ORDER — BUDESONIDE-FORMOTEROL FUMARATE 160-4.5 MCG/ACT IN AERO: INHALATION_SPRAY | RESPIRATORY_TRACT | Status: DC

## 2013-06-12 NOTE — Progress Notes (Signed)
PFT done today. 

## 2013-06-12 NOTE — Patient Instructions (Addendum)
Continue your present medications but work on hard on not smoking - this is the most important aspect of your care!  Ok to use the 02 with activity, not needed at rest   Only use your albuterol (proair) as a rescue medication to be used if you can't catch your breath by resting or doing a relaxed purse lip breathing pattern.  - The less you use it, the better it will work when you need it. - Ok to use up to 2 puffs  every 4 hours if you must but call for immediate appointment if use goes up over your usual need - Don't leave home without it !!  (think of it like the spare tire for your car)   Please schedule a follow up visit in 3 months but call sooner if needed

## 2013-06-12 NOTE — Progress Notes (Signed)
Subjective:    Patient ID: [name], male    DOB: 31-Jan-1951  MRN: [id number]    Brief patient profile:  62 yobm smoker on disability due to depression since 1994 baseline on spiriva with  doe room to room s 02 then underwent L THR at [facility] and noted even in hosp need 02 with any activity so   referred to pulmonary clinic 05/01/2013 by [facility] with GOLD III copd with mild reversibility documented 06/12/13    History of Present Illness  05/01/2013 1st [facility] office visit/ Wert cc doe x room to room if not on 02 using walker ever since mobilized from surgery 04/03/13 s cough, some chest tightness once or twice per week esp after cigarette use, has not tried inhalers at home (other than spiriva)  but nebs in hosp seemed to help rec Start symbicort 160 Take 2 puffs first thing in am and then another 2 puffs about 12 hours later.  Continue spiriva one in am Work on inhaler technique:    Please remember to go to the lab and x-ray department downstairs for your tests - we will call you with the results when they are available. The key is to stop smoking completely before smoking completely stops you!    06/12/2013 f/u ov/Wert re: GOLD III with reverisibility  Chief Complaint  Patient presents with  . Follow-up    Pt states that his breathing is improved in symbicort. No new co's today.   sleep ok on cpap, wears 02 3h per day "when tired"- not really limited so much by breathing but by back and legs - not used  saba at all  since started symbicort and doesn't even have an hfa saba at home  No obvious day to day or daytime variabilty or assoc chronic cough or cp or chest tightness, subjective wheeze overt sinus or hb symptoms. No unusual exp hx or h/o childhood pna/ asthma or knowledge of premature birth.  Sleeping ok without nocturnal  or early am exacerbation  of respiratory  c/o's or need for noct saba. Also denies any obvious fluctuation of symptoms with weather or environmental  changes or other aggravating or alleviating factors except as outlined above   Current Medications, Allergies, Complete Past Medical History, Past Surgical History, Family History, and Social History were reviewed in [employer] record.  ROS  The following are not active complaints unless bolded sore throat, dysphagia, dental problems, itching, sneezing,  nasal congestion or excess/ purulent secretions, ear ache,   fever, chills, sweats, unintended wt loss, pleuritic or exertional cp, hemoptysis,  orthopnea pnd or leg swelling, presyncope, palpitations, heartburn, abdominal pain, anorexia, nausea, vomiting, diarrhea  or change in bowel or urinary habits, change in stools or urine, dysuria,hematuria,  rash, arthralgias, visual complaints, headache, numbness weakness or ataxia or problems with walking or coordination,  change in mood/affect or memory.         .           Objective:   Physical Exam  W/c bm nad   06/12/2013      200  Wt Readings from Last 3 Encounters:  05/01/13 183 lb (83.008 kg)  04/04/13 182 lb (82.555 kg)  04/04/13 182 lb (82.555 kg)      HEENT mild turbinate edema.  Oropharynx no thrush or excess pnd or cobblestoning.  No JVD or cervical adenopathy. Mild accessory muscle hypertrophy. Trachea midline, nl thryroid. Chest was hyperinflated by percussion with diminished breath sounds  and moderate increased exp time with mid exp wheeze. Hoover sign positive at mid inspiration. Regular rate and rhythm without murmur gallop or rub or increase P2 or edema.  Abd: no hsm, nl excursion. Ext warm without cyanosis or clubbing.    CXR  05/01/2013 : Stable appearance of the chest. No evidence of acute airspace disease.    labs ok x eos 8.9% Assessment & Plan:

## 2013-06-13 ENCOUNTER — Ambulatory Visit (INDEPENDENT_AMBULATORY_CARE_PROVIDER_SITE_OTHER): Payer: 59 | Admitting: Psychiatry

## 2013-06-13 ENCOUNTER — Encounter (HOSPITAL_COMMUNITY): Payer: Self-pay | Admitting: Psychiatry

## 2013-06-13 VITALS — BP 138/86 | HR 93 | Ht 70.0 in | Wt 182.6 lb

## 2013-06-13 DIAGNOSIS — F3289 Other specified depressive episodes: Secondary | ICD-10-CM

## 2013-06-13 DIAGNOSIS — F329 Major depressive disorder, single episode, unspecified: Secondary | ICD-10-CM

## 2013-06-13 MED ORDER — MIRTAZAPINE 30 MG PO TABS: ORAL_TABLET | ORAL | Status: DC

## 2013-06-13 NOTE — Assessment & Plan Note (Signed)
Placed on 02 at discharge p THR 03/2013 3h/day and cpap at hs RA sats at rest 94% 1/127/15 so rec use 02 with activity only

## 2013-06-13 NOTE — Progress Notes (Signed)
[place] Progress Note  [name] [id number] 63 y.o.  06/13/2013 3:29 PM  Chief Complaint:  Medication management and followup.      History of Present Illness:  [name] came for his followup appointment.  He had missed her last appointment because he had knee surgery.  She is recovering from his knee surgery.  He has difficulty walking and he getting shortness of breath but overall his anxiety and depression is stable .  He is sleeping better.  He uses a walker for ambulation.  The patient was to continue his Remeron.  He likes the Remeron he denies any side effects.  His wife is supportive.  He denies any crying spells, irritability, panic attack or any agitation.  Suicidal Ideation: No Plan Formed: No Patient has means to carry out plan: No  Homicidal Ideation: No Plan Formed: No Patient has means to carry out plan: No  Review of Systems: Psychiatric: Agitation: No Hallucination: No Depressed Mood: No Insomnia: No Hypersomnia: No Altered Concentration: No Feels Worthless: No Grandiose Ideas: No Belief In Special Powers: No New/Increased Substance Abuse: No Compulsions: No  Neurologic: Headache: Yes Seizure: No Paresthesias: No  Past psychiatric history Patient has history of depression since 1990.  He has one suicidal attempt .  In the past he had tried Zoloft Paxil Prozac and Wellbutrin but he stopped due to side effects.  Patient has significant history of using cocaine marijuana and alcohol for 20 years.  He claims to be sober since she's been coming to this office.    Medical history Patient has history of hypertension, sleep apnea, BPH , COPD, diabetes, hyperlipidemia, coronary artery disease status post stent and arthritis.  He scheduled to have right hip replacement on November 21.  His primary care physician is Dr. Maryruth [name].  Social History: Patient was born and raised in [place].  His been living in [place] since 1986.  Patient  has 4 children who lives out of town.  He's been married for 2 years.  He has a very supportive wife.  His wife supervises his medication.    Outpatient Encounter Prescriptions as of 06/13/2013  Medication Sig  . albuterol (PROAIR HFA) 108 (90 BASE) MCG/ACT inhaler Inhale 2 puffs into the lungs every 6 (six) hours as needed for wheezing or shortness of breath. 2 puffs every 4 hours as needed only  if your can't catch your breath  . amLODipine (NORVASC) 5 MG tablet Take 5 mg by mouth at bedtime.   Marland Kitchen atenolol (TENORMIN) 25 MG tablet Take 25 mg by mouth at bedtime.   . budesonide-formoterol (SYMBICORT) 160-4.5 MCG/ACT inhaler Take 2 puffs first thing in am and then another 2 puffs about 12 hours later.  . clopidogrel (PLAVIX) 75 MG tablet Take 75 mg by mouth at bedtime.   . Coenzyme Q10 (COQ10) 100 MG CAPS Take 1 capsule by mouth at bedtime.  . Cyanocobalamin (B-12 SL) Place 3 drops under the tongue 3 (three) times a week.  . docusate sodium (COLACE) 50 MG capsule Take 50 mg by mouth daily as needed for mild constipation.  Marland Kitchen etodolac (LODINE) 400 MG tablet Take 400 mg by mouth 2 (two) times daily.  . ferrous sulfate 325 (65 FE) MG tablet Take 325 mg by mouth 2 (two) times daily with a meal.  . fish oil-omega-3 fatty acids 1000 MG capsule Take 1 g by mouth at bedtime.  Marland Kitchen guaiFENesin (MUCINEX) 600 MG 12 hr tablet Take 600 mg by  mouth 2 (two) times daily as needed.  Marland Kitchen HYDROcodone-acetaminophen (NORCO) 10-325 MG per tablet Take 1 tablet by mouth every 8 (eight) hours as needed.  . metFORMIN (GLUCOPHAGE) 500 MG tablet Take 500 mg by mouth 2 (two) times daily with a meal.   . mirtazapine (REMERON) 30 MG tablet TAKE ONE TABLET BY MOUTH AT BEDTIME  . Multiple Vitamin (MULTIVITAMIN WITH MINERALS) TABS Take 1 tablet by mouth 2 (two) times a week.   . nitroGLYCERIN (NITROSTAT) 0.4 MG SL tablet Place 0.4 mg under the tongue every 5 (five) minutes as needed for chest pain.  . Oxycodone HCl 10 MG TABS Take 10 mg  by mouth every 6 (six) hours as needed.  . pravastatin (PRAVACHOL) 80 MG tablet Take 80 mg by mouth at bedtime.  . tadalafil (CIALIS) 20 MG tablet Take 20 mg by mouth daily as needed. For erectile dysfunction  . Testosterone (ANDROGEL PUMP) 12.5 MG/ACT (1%) GEL Place onto the skin See admin instructions. Apply 1 pump to each shoulder once daily  . tiotropium (SPIRIVA) 18 MCG inhalation capsule Place 18 mcg into inhaler and inhale every morning.  . zolpidem (AMBIEN) 10 MG tablet Take 10 mg by mouth at bedtime as needed for sleep.  . [DISCONTINUED] mirtazapine (REMERON) 30 MG tablet TAKE ONE TABLET BY MOUTH AT BEDTIME    Past Psychiatric History/Hospitalization(s): Anxiety: Yes Bipolar Disorder: No Depression: No Mania: No Psychosis: No Schizophrenia: No Personality Disorder: No Hospitalization for psychiatric illness: Yes History of Electroconvulsive Shock Therapy: No Prior Suicide Attempts: Yes  Physical Exam: Constitutional:  BP 138/86  Pulse 93  Ht 5\' 10"  (1.778 m)  Wt 182 lb 9.6 oz (82.827 kg)  BMI 26.20 kg/m2  General Appearance: Patient appears tired, shortness of breath and difficulty walking.  He uses oxygen  Musculoskeletal: Strength & Muscle Tone: within normal limits Gait & Station: unsteady due to pain and recent hip surgery Patient leans: Front Review of Systems  Constitutional: Positive for malaise/fatigue.  Respiratory: Positive for shortness of breath.   Neurological: Positive for weakness.  Psychiatric/Behavioral: The patient is nervous/anxious and has insomnia.      Psychiatric: Speech (describe rate, volume, coherence, spontaneity, and abnormalities if any): Soft clear and coherent with normal tone volume  Thought Process (describe rate, content, abstract reasoning, and computation): Logical and goal-directed  Associations: Coherent, Relevant and Intact  Thoughts: normal  Mental Status: Orientation: oriented to person, place, time/date and  situation Mood & Affect: anxiety Attention Span & Concentration: Fair  Medical Decision Making (Choose Three): Established Problem, Stable/Improving (1), Review of Psycho-Social Stressors (1), Review of Last Therapy Session (1) and Review of New Medication or Change in Dosage (2)  Assessment: Axis I: Maj. depressive disorder, polysubstance dependence in complete remission  Axis II: Deferred  Axis III: See medical history  Axis IV: Mild to moderate  Axis V: 60-65   Plan:  I will continue Remeron 30 mg at bedtime. I recommend to call us back if he has any question or concerns. I will see him again in 6 months.  Kerstin Crusoe T., MD 06/13/2013

## 2013-06-13 NOTE — Assessment & Plan Note (Signed)

## 2013-06-13 NOTE — Assessment & Plan Note (Addendum)
-    05/01/2013 p extensive coaching HFA effectiveness =    75% - PFT's 06/12/13  FEV1  1.15 (37%) with ratrio 43 and 14% better p B2 p no rx day of study and DLCO 29%  DDX of  difficult airways managment all start with A and  include Adherence, Ace Inhibitors, Acid Reflux, Active Sinus Disease, Alpha 1 Antitripsin deficiency, Anxiety masquerading as Airways dz,  ABPA,  allergy(esp in young), Aspiration (esp in elderly), Adverse effects of DPI,  Active smokers, plus two Bs  = Bronchiectasis and Beta blocker use..and one C= CHF  Adherence is always the initial "prime suspect" and is a multilayered concern that requires a "trust but verify" approach in every patient - starting with knowing how to use medications, especially inhalers, correctly, keeping up with refills and understanding the fundamental difference between maintenance and prns vs those medications only taken for a very short course and then stopped and not refilled.  - The proper method of use, as well as anticipated side effects, of a metered-dose inhaler are discussed and demonstrated to the patient. Improved effectiveness after extensive coaching during this visit to a level of approximately  75% so continue symbicort 160 2 bid and spiriva as maint  Active smoking > discussed separately     Each maintenance medication was reviewed in detail including most importantly the difference between maintenance and as needed and under what circumstances the prns are to be used.  Please see instructions for details which were reviewed in writing and the patient given a copy.

## 2013-07-06 ENCOUNTER — Telehealth: Payer: Self-pay | Admitting: Internal Medicine

## 2013-07-06 DIAGNOSIS — R0600 Dyspnea, unspecified: Secondary | ICD-10-CM

## 2013-07-06 MED ORDER — BUDESONIDE-FORMOTEROL FUMARATE 160-4.5 MCG/ACT IN AERO
INHALATION_SPRAY | RESPIRATORY_TRACT | Status: DC
Start: 2013-07-06 — End: 2013-10-30

## 2013-07-06 NOTE — Telephone Encounter (Signed)
Called and spoke with pt and he is aware of samples of the symbicort that have been left up front.  He stated that his wife will come by today to pick these up.  appt has been scheduled for the pt to see MW on 4-6 and he is aware to bring in his formulary to this appt to review what meds his insurance will cover.

## 2013-07-06 NOTE — Telephone Encounter (Signed)
If he has Pharmacist, community the most he'll pay is 25 dollars - the pharmacist may not know how to handle to coupon correctly and may need to try another pharmacy but in the meantime give him enough samples til next ov or up to 6 weeks worth whichever comes first and we'll regroup then (with formulary)

## 2013-07-06 NOTE — Telephone Encounter (Signed)
Dr. Melvyn Novas please advise thanks  No Known Allergies

## 2013-08-20 ENCOUNTER — Ambulatory Visit: Payer: Self-pay | Admitting: Internal Medicine

## 2013-08-30 ENCOUNTER — Ambulatory Visit (INDEPENDENT_AMBULATORY_CARE_PROVIDER_SITE_OTHER): Payer: Medicare Other | Admitting: Internal Medicine

## 2013-08-30 ENCOUNTER — Encounter: Payer: Self-pay | Admitting: Internal Medicine

## 2013-08-30 ENCOUNTER — Encounter (INDEPENDENT_AMBULATORY_CARE_PROVIDER_SITE_OTHER): Payer: Self-pay

## 2013-08-30 VITALS — BP 126/78 | HR 110 | Temp 98.1°F | Ht 70.0 in | Wt 189.0 lb

## 2013-08-30 DIAGNOSIS — J449 Chronic obstructive pulmonary disease, unspecified: Secondary | ICD-10-CM

## 2013-08-30 DIAGNOSIS — F17200 Nicotine dependence, unspecified, uncomplicated: Secondary | ICD-10-CM

## 2013-08-30 NOTE — Patient Instructions (Addendum)
Continue your present medications but work on hard on not smoking - this is the most important aspect of your care!  Ok to use the 02 with activity, not needed at rest   Only use your albuterol (proair) as a rescue medication to be used if you can't catch your breath by resting or doing a relaxed purse lip breathing pattern.  - The less you use it, the better it will work when you need it. - Ok to use up to 2 puffs  every 4 hours if you must but call for immediate appointment if use goes up over your usual need - Don't leave home without it !!  (think of it like the spare tire for your car)   See Tammy NP w/in 2 weeks with all your medications and your formulary for your meds, even over the counter meds, separated in two separate bags, the ones you take no matter what vs the ones you stop once you feel better and take only as needed when you feel you need them.   Tammy  will generate for you a new user friendly medication calendar that will put [place] all on the same page re: your medication use.     Without this process, it simply isn't possible to assure that we are providing  your outpatient care  with  the attention to detail we feel you deserve.   If we cannot assure that you're getting that kind of care,  then we cannot manage your problem effectively from this clinic.  Once you have seen Tammy and we are sure that we're all on the same page with your medication use she will arrange follow up with me.

## 2013-08-30 NOTE — Assessment & Plan Note (Signed)

## 2013-08-30 NOTE — Progress Notes (Signed)
Subjective:    Patient ID: [name], male    DOB: 03/08/1951  MRN: [id number]    Brief patient profile:  12 yobm smoker on disability due to depression since 1994 baseline on spiriva with  doe room to room s 02 then underwent L THR at [facility] and noted even in hosp need 02 with any activity so   referred to pulmonary clinic 05/01/2013 by [facility] with GOLD III copd with mild reversibility documented 06/12/13    History of Present Illness  05/01/2013 1st [facility] office visit/ [name] cc doe x room to room if not on 02 using walker ever since mobilized from surgery 04/03/13 s cough, some chest tightness once or twice per week esp after cigarette use, has not tried inhalers at home (other than spiriva)  but nebs in hosp seemed to help rec Start symbicort 160 Take 2 puffs first thing in am and then another 2 puffs about 12 hours later.  Continue spiriva one in am Work on inhaler technique:    Please remember to go to the lab and x-ray department downstairs for your tests - we will call you with the results when they are available. The key is to stop smoking completely before smoking completely stops you!    06/12/2013 f/u ov/[name] re: GOLD III with reverisibility  Chief Complaint  Patient presents with  . Follow-up    Pt states that his breathing is improved in symbicort. No new co's today.   sleep ok on cpap, wears 02 3h per day "when tired"- not really limited so much by breathing but by back and legs - not used  saba at all  since started symbicort and doesn't even have an hfa saba at home rec Continue your present medications but work on hard on not smoking - this is the most important aspect of your care! Ok to use the 02 with activity, not needed at rest  Only use your albuterol (proair) as a rescue medication    08/30/2013 f/u ov/[name] re: GOLD III copd with reversibilty on spiriva and symbicort 160 2bid Chief Complaint  Patient presents with  . Follow-up    Pt states  breathing has been worse since for the past 2 days since he ran out of symbicort. Did not bring his formulary list from ins.   confused with instructions on how to use meds, esp saba, but now limited by hip >> sob so w/c bound and still smoking    No obvious day to day or daytime variabilty or assoc chronic cough or cp or chest tightness, subjective wheeze overt sinus or hb symptoms. No unusual exp hx or h/o childhood pna/ asthma or knowledge of premature birth.  Sleeping ok without nocturnal  or early am exacerbation  of respiratory  c/o's or need for noct saba. Also denies any obvious fluctuation of symptoms with weather or environmental changes or other aggravating or alleviating factors except as outlined above   Current Medications, Allergies, Complete Past Medical History, Past Surgical History, Family History, and Social History were reviewed in [employer] record.  ROS  The following are not active complaints unless bolded sore throat, dysphagia, dental problems, itching, sneezing,  nasal congestion or excess/ purulent secretions, ear ache,   fever, chills, sweats, unintended wt loss, pleuritic or exertional cp, hemoptysis,  orthopnea pnd or leg swelling, presyncope, palpitations, heartburn, abdominal pain, anorexia, nausea, vomiting, diarrhea  or change in bowel or urinary habits, change in stools or urine,  dysuria,hematuria,  rash, arthralgias, visual complaints, headache, numbness weakness or ataxia or problems with walking due to L Hip or coordination,  change in mood/affect or memory.         .           Objective:   Physical Exam  W/c bm nad   06/12/2013      200 >  08/30/2013  189  Wt Readings from Last 3 Encounters:  05/01/13 183 lb (83.008 kg)  04/04/13 182 lb (82.555 kg)  04/04/13 182 lb (82.555 kg)      HEENT mild turbinate edema.  Oropharynx no thrush or excess pnd or cobblestoning.  No JVD or cervical adenopathy. Mild accessory muscle  hypertrophy. Trachea midline, nl thryroid. Chest was hyperinflated by percussion with diminished breath sounds and moderate increased exp time with mid exp wheeze. Hoover sign positive at mid inspiration. Regular rate and rhythm without murmur gallop or rub or increase P2 or edema.  Abd: no hsm, nl excursion. Ext warm without cyanosis or clubbing.    CXR  05/01/2013 : Stable appearance of the chest. No evidence of acute airspace disease.    labs ok x eos 8.9% Assessment & Plan:

## 2013-08-30 NOTE — Assessment & Plan Note (Signed)
-   PFT's 06/12/13  FEV1  1.15 (37%) with ratio 43 and 14% better p B2 p no rx day of study and DLCO 29%  DDX of  difficult airways managment all start with A and  include Adherence, Ace Inhibitors, Acid Reflux, Active Sinus Disease, Alpha 1 Antitripsin deficiency, Anxiety masquerading as Airways dz,  ABPA,  allergy(esp in young), Aspiration (esp in elderly), Adverse effects of DPI,  Active smokers, plus two Bs  = Bronchiectasis and Beta blocker use..and one C= CHF  Adherence is always the initial "prime suspect" and is a multilayered concern that requires a "trust but verify" approach in every patient - starting with knowing how to use medications, especially inhalers, correctly, keeping up with refills and understanding the fundamental difference between maintenance and prns vs those medications only taken for a very short course and then stopped and not refilled.  - The proper method of use, as well as anticipated side effects, of a metered-dose inhaler are discussed and demonstrated to the patient. Improved effectiveness after extensive coaching during this visit to a level of approximately  75% - maint vs prns and formular issues addressed but he's not really getting it.   To keep things simple, I have asked the patient to first separate medicines that are perceived as maintenance, that is to be taken daily "no matter what", from those medicines that are taken on only on an as-needed basis and I have given the patient examples of both, and then return to see our NP to generate a  detailed  medication calendar which should be followed until the next physician sees the patient and updates it.     Active smoking also major concern - he's w/c bound and may not realize he's progressing toward a GOLD IV as a result of his ongoing cig use.

## 2013-09-14 ENCOUNTER — Encounter: Payer: Self-pay | Admitting: Adult Health

## 2013-09-14 ENCOUNTER — Ambulatory Visit (INDEPENDENT_AMBULATORY_CARE_PROVIDER_SITE_OTHER): Payer: Medicare Other | Admitting: Adult Health

## 2013-09-14 VITALS — BP 112/80 | HR 94 | Temp 98.1°F | Ht 70.0 in | Wt 186.2 lb

## 2013-09-14 DIAGNOSIS — J4489 Other specified chronic obstructive pulmonary disease: Secondary | ICD-10-CM

## 2013-09-14 DIAGNOSIS — J449 Chronic obstructive pulmonary disease, unspecified: Secondary | ICD-10-CM

## 2013-09-14 NOTE — Progress Notes (Signed)
Subjective:    Patient ID: [name], male    DOB: 10/07/1950  MRN: [id number]    Brief patient profile:  85 yobm smoker on disability due to depression since 1994 baseline on spiriva with  doe room to room s 02 then underwent L THR at [facility] and noted even in hosp need 02 with any activity so   referred to pulmonary clinic 05/01/2013 by [facility] with GOLD III copd with mild reversibility documented 06/12/13    History of Present Illness  05/01/2013 1st [facility] office visit/ Wert cc doe x room to room if not on 02 using walker ever since mobilized from surgery 04/03/13 s cough, some chest tightness once or twice per week esp after cigarette use, has not tried inhalers at home (other than spiriva)  but nebs in hosp seemed to help rec Start symbicort 160 Take 2 puffs first thing in am and then another 2 puffs about 12 hours later.  Continue spiriva one in am Work on inhaler technique:    Please remember to go to the lab and x-ray department downstairs for your tests - we will call you with the results when they are available. The key is to stop smoking completely before smoking completely stops you!    06/12/2013 f/u ov/Wert re: GOLD III with reverisibility  Chief Complaint  Patient presents with  . Follow-up    Pt states that his breathing is improved in symbicort. No new co's today.   sleep ok on cpap, wears 02 3h per day "when tired"- not really limited so much by breathing but by back and legs - not used  saba at all  since started symbicort and doesn't even have an hfa saba at home rec Continue your present medications but work on hard on not smoking - this is the most important aspect of your care! Ok to use the 02 with activity, not needed at rest  Only use your albuterol (proair) as a rescue medication    08/30/2013 f/u ov/Wert re: GOLD III copd with reversibilty on spiriva and symbicort 160 2bid Chief Complaint  Patient presents with  . Follow-up    Pt states  breathing has been worse since for the past 2 days since he ran out of symbicort. Did not bring his formulary list from ins.   confused with instructions on how to use meds, esp saba, but now limited by hip >> sob so w/c bound and still smoking  >>no changes   09/14/2013 Follow up  Returns for follow up and med review  We reviewed all his medications and organized them into a medication calendar with patient education. It appears that he is taking his medications correctly. It is hard for him to afford all of his co-pays at times. We discussed possible options w/ his  his formulary.-at this time cont on current regimen . If becomes too expensive will look at neb in place of maintenance meds .  He denies any chest pain, orthopnea, PND, or leg swelling. Says that his breathing is at his baseline without flare of cough or wheezing.     Current Medications, Allergies, Complete Past Medical History, Past Surgical History, Family History, and Social History were reviewed in [employer] record.  ROS  The following are not active complaints unless bolded sore throat, dysphagia, dental problems, itching, sneezing,  nasal congestion or excess/ purulent secretions, ear ache,   fever, chills, sweats, unintended wt loss, pleuritic or exertional cp, hemoptysis,  orthopnea pnd or leg swelling, presyncope, palpitations, heartburn, abdominal pain, anorexia, nausea, vomiting, diarrhea  or change in bowel or urinary habits, change in stools or urine, dysuria,hematuria,  rash, arthralgias, visual complaints, headache, numbness weakness or ataxia or problems with walking due to L Hip or coordination,  change in mood/affect or memory.         .           Objective:   Physical Exam  W/c bm nad   06/12/2013      200 >  08/30/2013  189 >186 .09/14/2013   HEENT mild turbinate edema.  Oropharynx no thrush or excess pnd or cobblestoning.  No JVD or cervical adenopathy. Mild accessory muscle  hypertrophy. Trachea midline, nl thryroid. Chest was hyperinflated by percussion with diminished breath sounds and moderate increased exp time with mid exp wheeze. Hoover sign positive at mid inspiration. Regular rate and rhythm without murmur gallop or rub or increase P2 or edema.  Abd: no hsm, nl excursion. Ext warm without cyanosis or clubbing.    CXR  05/01/2013 : Stable appearance of the chest. No evidence of acute airspace disease.    labs ok x eos 8.9% Assessment & Plan:

## 2013-09-14 NOTE — Assessment & Plan Note (Addendum)
Compensated on present regimen Encouraged on smoking cessation Patient's medications were reviewed today and patient education was given. Computerized medication calendar was adjusted/completed  Plan  Continue on current regimen  Work on not smoking  Follow med calendar closely and bring to each visit.  Follow up Dr. Melvyn Novas  In 6 weeks and As needed

## 2013-09-14 NOTE — Patient Instructions (Signed)
Continue on current regimen  Work on not smoking  Follow med calendar closely and bring to each visit.  Follow up Dr. Melvyn Novas  In 6 weeks and As needed

## 2013-10-04 NOTE — Addendum Note (Signed)
Addended by: Parke Poisson E on: 10/04/2013 09:32 AM   Modules accepted: Orders, Medications

## 2013-10-26 ENCOUNTER — Ambulatory Visit: Payer: Self-pay | Admitting: Internal Medicine

## 2013-10-30 ENCOUNTER — Ambulatory Visit (INDEPENDENT_AMBULATORY_CARE_PROVIDER_SITE_OTHER): Payer: Medicare Other | Admitting: Internal Medicine

## 2013-10-30 ENCOUNTER — Encounter: Payer: Self-pay | Admitting: Internal Medicine

## 2013-10-30 VITALS — BP 132/86 | HR 99 | Temp 98.4°F | Ht 70.0 in | Wt 184.0 lb

## 2013-10-30 DIAGNOSIS — F17200 Nicotine dependence, unspecified, uncomplicated: Secondary | ICD-10-CM

## 2013-10-30 DIAGNOSIS — R0600 Dyspnea, unspecified: Secondary | ICD-10-CM

## 2013-10-30 DIAGNOSIS — J4489 Other specified chronic obstructive pulmonary disease: Secondary | ICD-10-CM

## 2013-10-30 DIAGNOSIS — J449 Chronic obstructive pulmonary disease, unspecified: Secondary | ICD-10-CM

## 2013-10-30 DIAGNOSIS — R0989 Other specified symptoms and signs involving the circulatory and respiratory systems: Secondary | ICD-10-CM

## 2013-10-30 DIAGNOSIS — R0609 Other forms of dyspnea: Secondary | ICD-10-CM

## 2013-10-30 MED ORDER — ALBUTEROL SULFATE HFA 108 (90 BASE) MCG/ACT IN AERS: INHALATION_SPRAY | RESPIRATORY_TRACT | Status: DC

## 2013-10-30 MED ORDER — TIOTROPIUM BROMIDE MONOHYDRATE 18 MCG IN CAPS: 18.0000 ug | ORAL_CAPSULE | Freq: Every morning | RESPIRATORY_TRACT | Status: DC

## 2013-10-30 MED ORDER — BUDESONIDE-FORMOTEROL FUMARATE 160-4.5 MCG/ACT IN AERO: INHALATION_SPRAY | RESPIRATORY_TRACT | Status: DC

## 2013-10-30 NOTE — Patient Instructions (Addendum)
Symbicort 160 Take 2 puffs first thing in am and then another 2 puffs about 12 hours later.  Continue spiriva two puffs each am  Please schedule a follow up visit in 3 months but call sooner if needed

## 2013-10-30 NOTE — Progress Notes (Signed)
Subjective:    Patient ID: [name], male    DOB: 09-23-1950  MRN: [id number]    Brief patient profile:  34 yobm smoker on disability due to depression since 1994 baseline on spiriva with  doe room to room s 02 then underwent L THR at [facility] and noted even in hosp need 02 with any activity so   referred to pulmonary clinic 05/01/2013 by [facility] with GOLD III copd with mild reversibility documented 06/12/13    History of Present Illness  05/01/2013 1st [facility] office visit/ Wert cc doe x room to room if not on 02 using walker ever since mobilized from surgery 04/03/13 s cough, some chest tightness once or twice per week esp after cigarette use, has not tried inhalers at home (other than spiriva)  but nebs in hosp seemed to help rec Start symbicort 160 Take 2 puffs first thing in am and then another 2 puffs about 12 hours later.  Continue spiriva one in am Work on inhaler technique:    Please remember to go to the lab and x-ray department downstairs for your tests - we will call you with the results when they are available. The key is to stop smoking completely before smoking completely stops you!    06/12/2013 f/u ov/Wert re: GOLD III with reverisibility  Chief Complaint  Patient presents with  . Follow-up    Pt states that his breathing is improved in symbicort. No new co's today.   sleep ok on cpap, wears 02 3h per day "when tired"- not really limited so much by breathing but by back and legs - not used  saba at all  since started symbicort and doesn't even have an hfa saba at home rec Continue your present medications but work on hard on not smoking - this is the most important aspect of your care! Ok to use the 02 with activity, not needed at rest  Only use your albuterol (proair) as a rescue medication    08/30/2013 f/u ov/Wert re: GOLD III copd with reversibilty on spiriva and symbicort 160 2bid Chief Complaint  Patient presents with  . Follow-up    Pt states  breathing has been worse since for the past 2 days since he ran out of symbicort. Did not bring his formulary list from ins.   confused with instructions on how to use meds, esp saba, but now limited by hip >> sob so w/c bound and still smoking  >>rec Continue your present medications but work on hard on not smoking - this is the most important aspect of your care! Ok to use the 02 with activity, not needed at rest  Only use your albuterol (proair) as a rescue medication   09/14/2013 Follow up  Returns for follow up and med review  We reviewed all his medications and organized them into a medication calendar with patient education. It appears that he is taking his medications correctly. It is hard for him to afford all of his co-pays at times. We discussed possible options w/ his formulary.-at this time cont on current regimen . If becomes too expensive will look at neb in place of maintenance meds .  rec Continue on current regimen  Work on not smoking  Follow med calendar closely and bring to each visit.   10/30/2013 f/u ov/Wert re: GOLD III copd/ no med calendar  Chief Complaint  Patient presents with  . Follow-up    Pt c/o SOB worse with hot weather.  Denies cough/congestion.      Does not have rescue inhaler   No obvious day to day or daytime variabilty or assoc chronic cough or cp or chest tightness, subjective wheeze overt sinus or hb symptoms. No unusual exp hx or h/o childhood pna/ asthma or knowledge of premature birth.  Sleeping ok without nocturnal  or early am exacerbation  of respiratory  c/o's or need for noct saba. Also denies any obvious fluctuation of symptoms with weather or environmental changes or other aggravating or alleviating factors except as outlined above   Current Medications, Allergies, Complete Past Medical History, Past Surgical History, Family History, and Social History were reviewed in [employer] record.       ROS  The  following are not active complaints unless bolded sore throat, dysphagia, dental problems, itching, sneezing,  nasal congestion or excess/ purulent secretions, ear ache,   fever, chills, sweats, unintended wt loss, pleuritic or exertional cp, hemoptysis,  orthopnea pnd or leg swelling, presyncope, palpitations, heartburn, abdominal pain, anorexia, nausea, vomiting, diarrhea  or change in bowel or urinary habits, change in stools or urine, dysuria,hematuria,  rash, arthralgias, visual complaints, headache, numbness weakness or ataxia or problems with walking due to L Hip or coordination,  change in mood/affect or memory.         .           Objective:   Physical Exam  W/c bm nad   06/12/2013      200 >  08/30/2013  189 >186 09/14/2013 > 10/30/2013  184  HEENT mild turbinate edema.  Oropharynx no thrush or excess pnd or cobblestoning.  No JVD or cervical adenopathy. Mild accessory muscle hypertrophy. Trachea midline, nl thryroid. Chest was hyperinflated by percussion with diminished breath sounds and moderate increased exp time with mid exp wheeze. Hoover sign positive at mid inspiration. Regular rate and rhythm without murmur gallop or rub or increase P2 or edema.  Abd: no hsm, nl excursion. Ext warm without cyanosis or clubbing.    CXR  05/01/2013 : Stable appearance of the chest. No evidence of acute airspace disease.    labs ok x eos 8.9% Assessment & Plan:

## 2013-10-31 NOTE — Assessment & Plan Note (Signed)

## 2013-10-31 NOTE — Assessment & Plan Note (Signed)
-   PFT's 06/12/13  FEV1  1.15 (37%) with ratio 43 and 14% better p B2 p no rx day of study and DLCO 29% - 08/30/2013 p extensive coaching HFA effectiveness =    75%  -09/14/2013 med calendar > did not bring to office as requested 10/30/13    Each maintenance medication was reviewed in detail including most importantly the difference between maintenance and as needed and under what circumstances the prns are to be used.  Please see instructions for details which were reviewed in writing and the patient given a copy.

## 2013-12-10 ENCOUNTER — Telehealth: Payer: Self-pay | Admitting: Internal Medicine

## 2013-12-10 DIAGNOSIS — R0600 Dyspnea, unspecified: Secondary | ICD-10-CM

## 2013-12-10 MED ORDER — BUDESONIDE-FORMOTEROL FUMARATE 160-4.5 MCG/ACT IN AERO: INHALATION_SPRAY | RESPIRATORY_TRACT | Status: DC

## 2013-12-10 NOTE — Telephone Encounter (Signed)
Pt aware samples left for pick up. Nothing further needed 

## 2013-12-13 ENCOUNTER — Encounter (HOSPITAL_COMMUNITY): Payer: Self-pay | Admitting: Psychiatry

## 2013-12-13 ENCOUNTER — Ambulatory Visit (INDEPENDENT_AMBULATORY_CARE_PROVIDER_SITE_OTHER): Payer: 59 | Admitting: Psychiatry

## 2013-12-13 VITALS — BP 109/79 | HR 95 | Ht 70.0 in | Wt 180.8 lb

## 2013-12-13 DIAGNOSIS — F329 Major depressive disorder, single episode, unspecified: Secondary | ICD-10-CM

## 2013-12-13 DIAGNOSIS — F3289 Other specified depressive episodes: Secondary | ICD-10-CM

## 2013-12-13 MED ORDER — MIRTAZAPINE 30 MG PO TABS: ORAL_TABLET | ORAL | Status: DC

## 2013-12-13 NOTE — Progress Notes (Signed)
[facility] Progress Note  [name] [id number] 63 y.o.  12/13/2013 3:58 PM  Chief Complaint:  Medication management and followup.      History of Present Illness:  [name] came for his followup appointment.  He has been taking his medication without any side effects.  He is completing 12 been and he is not happy his been is not getting under control.  He is taking hydrocodone however his physician had stopped oxycodone and he has noticed increase in density in his chronic pain.  He is wondering if he can't take any other pain medication beside hydrocodone.  I explained that he should have this discussion with his pain specialist who is managing his pain medication however I recommended Lidoderm patch can be helpful with his physician is concerned about addiction and dependency with the medication.  Overall his mood has been good.  He is eating good.  He is sleeping good.  He denies any crying spells, irritability, mood swings.  His wife is very supportive.  He denies any hallucination or any paranoia.  He has no tremors or shakes.  He uses his wheelchair for ambulation because of shortness of breath.  He is not drinking or using any illegal substances.  Suicidal Ideation: No Plan Formed: No Patient has means to carry out plan: No  Homicidal Ideation: No Plan Formed: No Patient has means to carry out plan: No  Review of Systems: Psychiatric: Agitation: No Hallucination: No Depressed Mood: No Insomnia: No Hypersomnia: No Altered Concentration: No Feels Worthless: No Grandiose Ideas: No Belief In Special Powers: No New/Increased Substance Abuse: No Compulsions: No  Neurologic: Headache: Yes Seizure: No Paresthesias: No  Past psychiatric history Patient has history of depression since 1990.  He has one suicidal attempt .  In the past he had tried Zoloft Paxil Prozac and Wellbutrin but he stopped due to side effects.  Patient has significant history of using  cocaine marijuana and alcohol for 20 years.  He claims to be sober since she's been coming to this office.    Medical history Patient has history of hypertension, sleep apnea, BPH , COPD, diabetes, hyperlipidemia, coronary artery disease status post stent and arthritis.  He scheduled to have right hip replacement on November 21.  His primary care physician is [name].  Social History:  Patient was born and raised in [place].  His been living in [place] since 1986.  Patient has 4 children who lives out of town.  He's been married for 2 years.  He has a very supportive wife.  His wife supervises his medication.    Outpatient Encounter Prescriptions as of 12/13/2013  Medication Sig  . HYDROcodone-acetaminophen (NORCO) 10-325 MG per tablet Take 1 tablet by mouth every 6 (six) hours as needed.  . mirtazapine (REMERON) 30 MG tablet TAKE ONE TABLET BY MOUTH AT BEDTIME  . [DISCONTINUED] mirtazapine (REMERON) 30 MG tablet TAKE ONE TABLET BY MOUTH AT BEDTIME  . albuterol (PROAIR HFA) 108 (90 BASE) MCG/ACT inhaler 2 puffs every 4 hours as needed only  if your can't catch your breath  . amLODipine (NORVASC) 5 MG tablet Take 5 mg by mouth at bedtime.   Marland Kitchen atenolol (TENORMIN) 25 MG tablet Take 25 mg by mouth at bedtime.   . budesonide-formoterol (SYMBICORT) 160-4.5 MCG/ACT inhaler Take 2 puffs first thing in am and then another 2 puffs about 12 hours later.  . clopidogrel (PLAVIX) 75 MG tablet Take 75 mg by mouth at bedtime.   Marland Kitchen  Coenzyme Q10 (COQ10) 100 MG CAPS Take 1 capsule by mouth every morning.   Marland Kitchen dextromethorphan-guaiFENesin (MUCINEX DM) 30-600 MG per 12 hr tablet Take 1 tablet by mouth every 12 (twelve) hours as needed for cough.  . fish oil-omega-3 fatty acids 1000 MG capsule Take 1 g by mouth every morning.   . metFORMIN (GLUCOPHAGE) 500 MG tablet 2 tabs by mouth once daily at bedtime  . Multiple Vitamin (MULTIVITAMIN WITH MINERALS) TABS Take 1 tablet by mouth daily.   . nitroGLYCERIN  (NITROSTAT) 0.4 MG SL tablet Place 0.4 mg under the tongue every 5 (five) minutes as needed for chest pain.  Marland Kitchen oxymetazoline (AFRIN) 0.05 % nasal spray 2 puffs twice daily x3 days, stop  . pravastatin (PRAVACHOL) 80 MG tablet Take 80 mg by mouth at bedtime.  . Sodium Chloride-Sodium Bicarb (AYR SALINE NASAL RINSE) 1.57 G PACK as needed.  . tadalafil (CIALIS) 20 MG tablet Take 20 mg by mouth daily as needed. For erectile dysfunction  . Testosterone (ANDROGEL PUMP) 12.5 MG/ACT (1%) GEL Apply 1 pump to each shoulder once daily  . tiotropium (SPIRIVA) 18 MCG inhalation capsule Place 1 capsule (18 mcg total) into inhaler and inhale every morning.  . vitamin B-12 (CYANOCOBALAMIN) 1000 MCG tablet Take 1,000 mcg by mouth every morning.    Past Psychiatric History/Hospitalization(s): Anxiety: Yes Bipolar Disorder: No Depression: No Mania: No Psychosis: No Schizophrenia: No Personality Disorder: No Hospitalization for psychiatric illness: Yes History of Electroconvulsive Shock Therapy: No Prior Suicide Attempts: Yes  Physical Exam: Constitutional:  BP 109/79  Pulse 95  Ht 5\' 10"  (1.778 m)  Wt 180 lb 12.8 oz (82.01 kg)  BMI 25.94 kg/m2  General Appearance: Patient appears tired, shortness of breath and difficulty walking.  He uses oxygen  Musculoskeletal: Strength & Muscle Tone: within normal limits Gait & Station: unsteady due to pain and recent hip surgery Patient leans: Front Review of Systems  Constitutional: Positive for malaise/fatigue.  Respiratory: Positive for shortness of breath.   Neurological: Positive for weakness.  Psychiatric/Behavioral: The patient is nervous/anxious and has insomnia.      Psychiatric: Speech (describe rate, volume, coherence, spontaneity, and abnormalities if any): Soft clear and coherent with normal tone volume  Thought Process (describe rate, content, abstract reasoning, and computation): Logical and goal-directed  Associations: Coherent,  Relevant and Intact  Thoughts: normal  Mental Status: Orientation: oriented to person, place, time/date and situation Mood & Affect: anxiety Attention Span & Concentration: Fair  Established Problem, Stable/Improving (1), Review of Psycho-Social Stressors (1), Review of Last Therapy Session (1) and Review of New Medication or Change in Dosage (2)  Assessment: Axis I: Maj. depressive disorder, polysubstance dependence in complete remission  Axis II: Deferred  Axis III: See medical history  Axis IV: Mild to moderate  Axis V: 60-65   Plan:  Patient's depression is under control with the Remeron .  He is concerned about his chronic pain.  I recommended to discuss with his pain specialist who is managing his chronic pain .  I will continue Remeron 30 mg at bedtime. I recommend to call us back if he has any question or concerns. I will see him again in 6 months.  Michaline Kindig T., MD 12/13/2013

## 2014-01-02 ENCOUNTER — Telehealth: Payer: Self-pay | Admitting: Internal Medicine

## 2014-01-02 DIAGNOSIS — R0600 Dyspnea, unspecified: Secondary | ICD-10-CM

## 2014-01-02 MED ORDER — BUDESONIDE-FORMOTEROL FUMARATE 160-4.5 MCG/ACT IN AERO: INHALATION_SPRAY | RESPIRATORY_TRACT | Status: DC

## 2014-01-02 NOTE — Telephone Encounter (Signed)
Pt is aware that sample is at front for pick up.

## 2014-01-28 ENCOUNTER — Ambulatory Visit (INDEPENDENT_AMBULATORY_CARE_PROVIDER_SITE_OTHER)
Admission: RE | Admit: 2014-01-28 | Discharge: 2014-01-28 | Disposition: A | Discharge status: Home / Self Care | Payer: Medicare Other | Source: Ambulatory Visit | Attending: Internal Medicine | Admitting: Internal Medicine

## 2014-01-28 ENCOUNTER — Encounter: Payer: Self-pay | Admitting: Internal Medicine

## 2014-01-28 ENCOUNTER — Ambulatory Visit (INDEPENDENT_AMBULATORY_CARE_PROVIDER_SITE_OTHER): Payer: Medicare Other | Admitting: Internal Medicine

## 2014-01-28 VITALS — BP 122/70 | HR 107 | Temp 98.8°F | Ht 70.0 in | Wt 184.0 lb

## 2014-01-28 DIAGNOSIS — J449 Chronic obstructive pulmonary disease, unspecified: Secondary | ICD-10-CM

## 2014-01-28 DIAGNOSIS — J9610 Chronic respiratory failure, unspecified whether with hypoxia or hypercapnia: Secondary | ICD-10-CM

## 2014-01-28 DIAGNOSIS — R0902 Hypoxemia: Secondary | ICD-10-CM

## 2014-01-28 DIAGNOSIS — Z23 Encounter for immunization: Secondary | ICD-10-CM

## 2014-01-28 DIAGNOSIS — J9611 Chronic respiratory failure with hypoxia: Secondary | ICD-10-CM

## 2014-01-28 DIAGNOSIS — F17200 Nicotine dependence, unspecified, uncomplicated: Secondary | ICD-10-CM

## 2014-01-28 NOTE — Progress Notes (Signed)
Subjective:    Patient ID: [name], male    DOB: 1950/05/26  MRN: [id number]    Brief patient profile:  36 yobm smoker on disability due to depression since 1994 baseline on spiriva with  doe room to room s 02 then underwent L THR at [facility] and noted even in hosp need 02 with any activity so   referred to pulmonary clinic 05/01/2013 by [facility] with GOLD III copd with mild reversibility documented 06/12/13    History of Present Illness  05/01/2013 1st [facility] office visit/ [name] cc doe x room to room if not on 02 using walker ever since mobilized from surgery 04/03/13 s cough, some chest tightness once or twice per week esp after cigarette use, has not tried inhalers at home (other than spiriva)  but nebs in hosp seemed to help rec Start symbicort 160 Take 2 puffs first thing in am and then another 2 puffs about 12 hours later.  Continue spiriva one in am Work on inhaler technique:    Please remember to go to the lab and x-ray department downstairs for your tests - we will call you with the results when they are available. The key is to stop smoking completely before smoking completely stops you!    06/12/2013 f/u ov/[name] re: GOLD III with reverisibility  Chief Complaint  Patient presents with  . Follow-up    Pt states that his breathing is improved in symbicort. No new co's today.   sleep ok on cpap, wears 02 3h per day "when tired"- not really limited so much by breathing but by back and legs - not used  saba at all  since started symbicort and doesn't even have an hfa saba at home rec Continue your present medications but work on hard on not smoking - this is the most important aspect of your care! Ok to use the 02 with activity, not needed at rest  Only use your albuterol (proair) as a rescue medication    08/30/2013 f/u ov/[name] re: GOLD III copd with reversibilty on spiriva and symbicort 160 2bid Chief Complaint  Patient presents with  . Follow-up    Pt states  breathing has been worse since for the past 2 days since he ran out of symbicort. Did not bring his formulary list from ins.   confused with instructions on how to use meds, esp saba, but now limited by hip >> sob so w/c bound and still smoking  >>rec Continue your present medications but work on hard on not smoking - this is the most important aspect of your care! Ok to use the 02 with activity, not needed at rest  Only use your albuterol (proair) as a rescue medication   09/14/2013 Follow up  Returns for follow up and med review  We reviewed all his medications and organized them into a medication calendar with patient education. It appears that he is taking his medications correctly. It is hard for him to afford all of his co-pays at times. We discussed possible options w/ his formulary.-at this time cont on current regimen . If becomes too expensive will look at neb in place of maintenance meds .  rec Continue on current regimen  Work on not smoking  Follow med calendar closely and bring to each visit.   10/30/2013 f/u ov/[name] re: GOLD III copd/ no med calendar  Chief Complaint  Patient presents with  . Follow-up    Pt c/o SOB worse with hot weather.  Denies cough/congestion.    Does not have rescue inhaler  rec Symbicort 160 Take 2 puffs first thing in am and then another 2 puffs about 12 hours later.  Continue spiriva two puffs each am   01/28/2014 f/u ov/[name] re: copd III/ 02 with activity 2lp / cpap s 02 / no med calenar  Chief Complaint  Patient presents with  . Follow-up    Pt states that he is having increased chest congestion and non prod cough x 10 days. No changes in his breathing. He has used rescue inhaler x 2 in the past wk.   no color to mucus  Ran out of symbicort then apparently worse since then, not watching the dial on top to judge when it runs out so not clear when this was Also not sure how much saba he can use    No obvious day to day or daytime variabilty  or assoc chronic cough or cp or chest tightness, subjective wheeze overt sinus or hb symptoms. No unusual exp hx or h/o childhood pna/ asthma or knowledge of premature birth.  Sleeping ok without nocturnal  or early am exacerbation  of respiratory  c/o's or need for noct saba. Also denies any obvious fluctuation of symptoms with weather or environmental changes or other aggravating or alleviating factors except as outlined above   Current Medications, Allergies, Complete Past Medical History, Past Surgical History, Family History, and Social History were reviewed in [employer] record.       ROS  The following are not active complaints unless bolded sore throat, dysphagia, dental problems, itching, sneezing,  nasal congestion or excess/ purulent secretions, ear ache,   fever, chills, sweats, unintended wt loss, pleuritic or exertional cp, hemoptysis,  orthopnea pnd or leg swelling, presyncope, palpitations, heartburn, abdominal pain, anorexia, nausea, vomiting, diarrhea  or change in bowel or urinary habits, change in stools or urine, dysuria,hematuria,  rash, arthralgias, visual complaints, headache, numbness weakness or ataxia or problems with walking due to L Hip or coordination,  change in mood/affect or memory.         .           Objective:   Physical Exam  W/c bm nad   06/12/2013      200 >  08/30/2013  189 >186 09/14/2013 > 10/30/2013  184> 01/28/2014  184   HEENT mild turbinate edema.  Oropharynx no thrush or excess pnd or cobblestoning.  No JVD or cervical adenopathy. Mild accessory muscle hypertrophy. Trachea midline, nl thryroid. Chest was hyperinflated by percussion with diminished breath sounds and moderate increased exp time with mid exp wheeze. Hoover sign positive at mid inspiration. Regular rate and rhythm without murmur gallop or rub or increase P2 or edema.  Abd: no hsm, nl excursion. Ext warm without cyanosis or clubbing.    CXR  01/28/2014 : 1. No  acute cardiopulmonary disease. 2. COPD.      Assessment & Plan:

## 2014-01-28 NOTE — Patient Instructions (Addendum)
Restart Symbicort 160 Take 2 puffs first thing in am and then another 2 puffs about 12 hours later  Only use your albuterol as a rescue medication to be used if you can't catch your breath by resting or doing a relaxed purse lip breathing pattern.  - The less you use it, the better it will work when you need it. - Ok to use up to 2 puffs  every 4 hours if you must but call for immediate appointment if use goes up over your usual need - Don't leave home without it !!  (think of it like the spare tire for your car)   No fish oil when coughing (no oil based vitamins)  Please remember to go to the  x-ray department downstairs for your tests - we will call you with the results when they are available.    The key is to stop smoking completely before smoking completely stops you!   Please schedule a follow up office visit in 4 weeks, sooner if needed  - spiriva x 3 boxes and symb x 2 samples

## 2014-01-30 NOTE — Progress Notes (Signed)
Quick Note:  Spoke with pt and notified of results per Dr. Wert. Pt verbalized understanding and denied any questions.  ______ 

## 2014-01-30 NOTE — Assessment & Plan Note (Signed)

## 2014-01-30 NOTE — Assessment & Plan Note (Signed)
Placed on 02 at discharge p THR 03/2013 3h/day and cpap at hs RA sats at rest 94% 1/127/15 so rec use 02 with activity only   Adequate control on present rx, reviewed > no change in rx needed

## 2014-01-30 NOTE — Assessment & Plan Note (Signed)
-   PFT's 06/12/13  FEV1  1.15 (37%) with ratrio 43 and 14% better p B2 p no rx day of study and DLCO 29% -09/14/2013 med calendar > did not bring to office as requested 10/30/13 or 01/28/14 - 01/28/2014 p extensive coaching HFA effectiveness =    75%, 100% with dpi   DDX of  difficult airways management all start with A and  include Adherence, Ace Inhibitors, Acid Reflux, Active Sinus Disease, Alpha 1 Antitripsin deficiency, Anxiety masquerading as Airways dz,  ABPA,  allergy(esp in young), Aspiration (esp in elderly), Adverse effects of DPI,  Active smokers, plus two Bs  = Bronchiectasis and Beta blocker use..and one C= CHF  Adherence is always the initial "prime suspect" and is a multilayered concern that requires a "trust but verify" approach in every patient - starting with knowing how to use medications, especially inhalers, correctly, keeping up with refills and understanding the fundamental difference between maintenance and prns vs those medications only taken for a very short course and then stopped and not refilled.  - not following user friendly med calendar - The proper method of use, as well as anticipated side effects, of a metered-dose inhaler are discussed and demonstrated to the patient. Improved effectiveness after extensive coaching during this visit to a level of approximately  75% so gave him 4 weeks samples of both wit f/u in 4 weeks to recount   Active smoking > discussed separately

## 2014-02-26 ENCOUNTER — Ambulatory Visit: Payer: Self-pay | Admitting: Internal Medicine

## 2014-03-07 ENCOUNTER — Ambulatory Visit (INDEPENDENT_AMBULATORY_CARE_PROVIDER_SITE_OTHER): Payer: Medicare Other | Admitting: Internal Medicine

## 2014-03-07 ENCOUNTER — Encounter: Payer: Self-pay | Admitting: Internal Medicine

## 2014-03-07 VITALS — BP 136/90 | HR 88 | Temp 98.1°F | Ht 70.0 in | Wt 194.0 lb

## 2014-03-07 DIAGNOSIS — J449 Chronic obstructive pulmonary disease, unspecified: Secondary | ICD-10-CM

## 2014-03-07 DIAGNOSIS — Z720 Tobacco use: Secondary | ICD-10-CM

## 2014-03-07 DIAGNOSIS — F17210 Nicotine dependence, cigarettes, uncomplicated: Secondary | ICD-10-CM

## 2014-03-07 DIAGNOSIS — F17200 Nicotine dependence, unspecified, uncomplicated: Secondary | ICD-10-CM

## 2014-03-07 NOTE — Patient Instructions (Signed)
Keep working on stopping smoking, this is the most important aspect of your care  Please schedule a follow up visit in 3 months but call sooner if needed

## 2014-03-07 NOTE — Progress Notes (Signed)
Subjective:    Patient ID: [name], male    DOB: 07/03/1950  MRN: [id number]    Brief patient profile:  72 yobm smoker on disability due to depression since 1994 baseline on spiriva with  doe room to room s 02 then underwent L THR at [facility] and noted even in hosp need 02 with any activity so   referred to pulmonary clinic 05/01/2013 by [facility] with GOLD III copd with mild reversibility documented 06/12/13    History of Present Illness  05/01/2013 1st [facility] office visit/ Wert cc doe x room to room if not on 02 using walker ever since mobilized from surgery 04/03/13 s cough, some chest tightness once or twice per week esp after cigarette use, has not tried inhalers at home (other than spiriva)  but nebs in hosp seemed to help rec Start symbicort 160 Take 2 puffs first thing in am and then another 2 puffs about 12 hours later.  Continue spiriva one in am Work on inhaler technique:    Please remember to go to the lab and x-ray department downstairs for your tests - we will call you with the results when they are available. The key is to stop smoking completely before smoking completely stops you!    06/12/2013 f/u ov/Wert re: GOLD III with reverisibility  Chief Complaint  Patient presents with  . Follow-up    Pt states that his breathing is improved in symbicort. No new co's today.   sleep ok on cpap, wears 02 3h per day "when tired"- not really limited so much by breathing but by back and legs - not used  saba at all  since started symbicort and doesn't even have an hfa saba at home rec Continue your present medications but work on hard on not smoking - this is the most important aspect of your care! Ok to use the 02 with activity, not needed at rest  Only use your albuterol (proair) as a rescue medication    08/30/2013 f/u ov/Wert re: GOLD III copd with reversibilty on spiriva and symbicort 160 2bid Chief Complaint  Patient presents with  . Follow-up    Pt states  breathing has been worse since for the past 2 days since he ran out of symbicort. Did not bring his formulary list from ins.   confused with instructions on how to use meds, esp saba, but now limited by hip >> sob so w/c bound and still smoking  >>rec Continue your present medications but work on hard on not smoking - this is the most important aspect of your care! Ok to use the 02 with activity, not needed at rest  Only use your albuterol (proair) as a rescue medication   09/14/2013 Follow up  Returns for follow up and med review  We reviewed all his medications and organized them into a medication calendar with patient education. It appears that he is taking his medications correctly. It is hard for him to afford all of his co-pays at times. We discussed possible options w/ his formulary.-at this time cont on current regimen . If becomes too expensive will look at neb in place of maintenance meds .  rec Continue on current regimen  Work on not smoking  Follow med calendar closely and bring to each visit.   10/30/2013 f/u ov/Wert re: GOLD III copd/ no med calendar  Chief Complaint  Patient presents with  . Follow-up    Pt c/o SOB worse with hot weather.  Denies cough/congestion.    Does not have rescue inhaler  rec Symbicort 160 Take 2 puffs first thing in am and then another 2 puffs about 12 hours later.  Continue spiriva two puffs each am   01/28/2014 f/u ov/Wert re: copd III/ 02 with activity 2lp / cpap s 02 / no med calenar  Chief Complaint  Patient presents with  . Follow-up    Pt states that he is having increased chest congestion and non prod cough x 10 days. No changes in his breathing. He has used rescue inhaler x 2 in the past wk.   no color to mucus  Ran out of symbicort then apparently worse since then, not watching the dial on top to judge when it runs out so not clear when this was Also not sure how much saba he can use  rec No change rx   03/07/2014 f/u ov/Wert  re: copd GOLD III/ no med calendar / almost  Quit smoking x one month  Chief Complaint  Patient presents with  . Follow-up    Breathing has improved. Has not had to use rescue inhaler.  No new co's today.     Not limited by breathing from desired activities  But rather by hip > still mostly w/c dep   No obvious day to day or daytime variabilty or assoc chronic cough or cp or chest tightness, subjective wheeze overt sinus or hb symptoms. No unusual exp hx or h/o childhood pna/ asthma or knowledge of premature birth.  Sleeping ok without nocturnal  or early am exacerbation  of respiratory  c/o's or need for noct saba. Also denies any obvious fluctuation of symptoms with weather or environmental changes or other aggravating or alleviating factors except as outlined above   Current Medications, Allergies, Complete Past Medical History, Past Surgical History, Family History, and Social History were reviewed in [employer] record.       ROS  The following are not active complaints unless bolded sore throat, dysphagia, dental problems, itching, sneezing,  nasal congestion or excess/ purulent secretions, ear ache,   fever, chills, sweats, unintended wt loss, pleuritic or exertional cp, hemoptysis,  orthopnea pnd or leg swelling, presyncope, palpitations, heartburn, abdominal pain, anorexia, nausea, vomiting, diarrhea  or change in bowel or urinary habits, change in stools or urine, dysuria,hematuria,  rash, arthralgias, visual complaints, headache, numbness weakness or ataxia or problems with walking due to L Hip or coordination,  change in mood/affect or memory.         .           Objective:   Physical Exam  W/c bm nad   06/12/2013      200 >  08/30/2013  189 >186 09/14/2013 > 10/30/2013  184> 01/28/2014  184 > 03/06/14  194   HEENT mild turbinate edema.  Oropharynx no thrush or excess pnd or cobblestoning.  No JVD or cervical adenopathy. Mild accessory muscle  hypertrophy. Trachea midline, nl thryroid. Chest was hyperinflated by percussion with diminished breath sounds and moderate increased exp time with mid exp wheeze. Hoover sign positive at mid inspiration. Regular rate and rhythm without murmur gallop or rub or increase P2 or edema.  Abd: no hsm, nl excursion. Ext warm without cyanosis or clubbing.    CXR  01/28/2014 : 1. No acute cardiopulmonary disease. 2. COPD.      Assessment & Plan:

## 2014-03-10 ENCOUNTER — Encounter: Payer: Self-pay | Admitting: Internal Medicine

## 2014-03-10 NOTE — Assessment & Plan Note (Signed)

## 2014-03-10 NOTE — Assessment & Plan Note (Signed)
-   PFT's 06/12/13  FEV1  1.15 (37%) with ratrio 43 and 14% better p B2 p no rx day of study and DLCO 29% -09/14/2013 med calendar > did not bring to office as requested 10/30/13 or 01/28/14 - 01/28/2014 p extensive coaching HFA effectiveness =    75%, 100% with dpi   Adequate control on present rx, reviewed > no change in rx needed      Each maintenance medication was reviewed in detail including most importantly the difference between maintenance and as needed and under what circumstances the prns are to be used.  Please see instructions for details which were reviewed in writing and the patient given a copy.

## 2014-04-12 ENCOUNTER — Telehealth: Payer: Self-pay | Admitting: Internal Medicine

## 2014-04-12 MED ORDER — BUDESONIDE-FORMOTEROL FUMARATE 160-4.5 MCG/ACT IN AERO: 2.0000 | INHALATION_SPRAY | Freq: Two times a day (BID) | RESPIRATORY_TRACT | Status: DC

## 2014-04-12 NOTE — Telephone Encounter (Signed)
Called pt and is aware 1 sample of symbicort has been left for pick up. Nothing further needed

## 2014-04-13 IMAGING — CR DG CHEST 2V
3 series · 3 of 3 positions shown · non-contrast
Comparison: 03/30/2013

CLINICAL DATA: Cough, shortness of breath, COPD.

EXAM:
CHEST  2 VIEW

[view not recorded (1 of 3)]
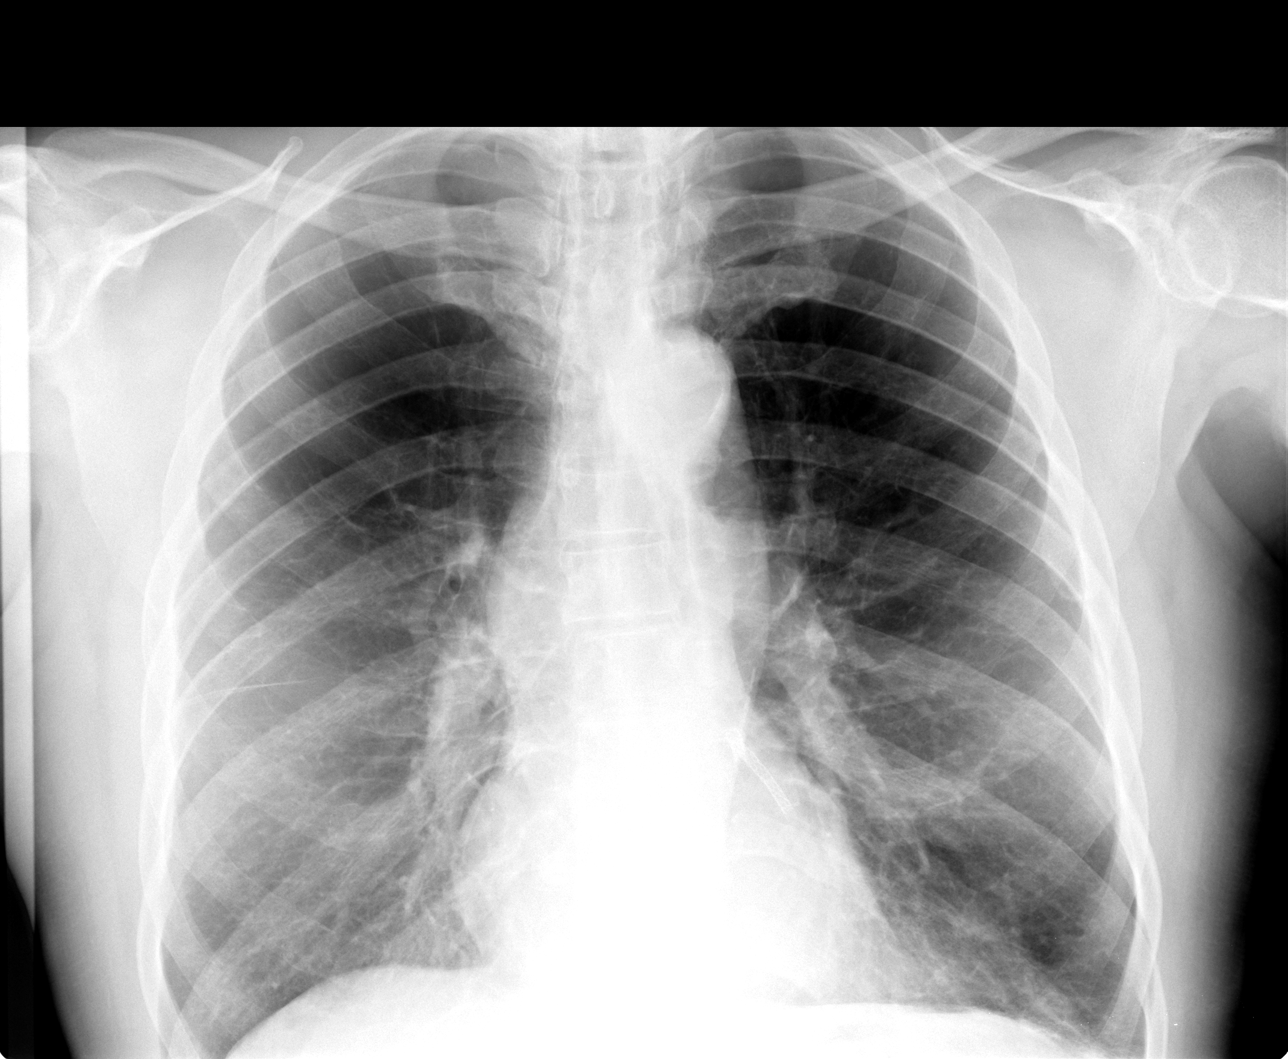

[view not recorded (2 of 3)]
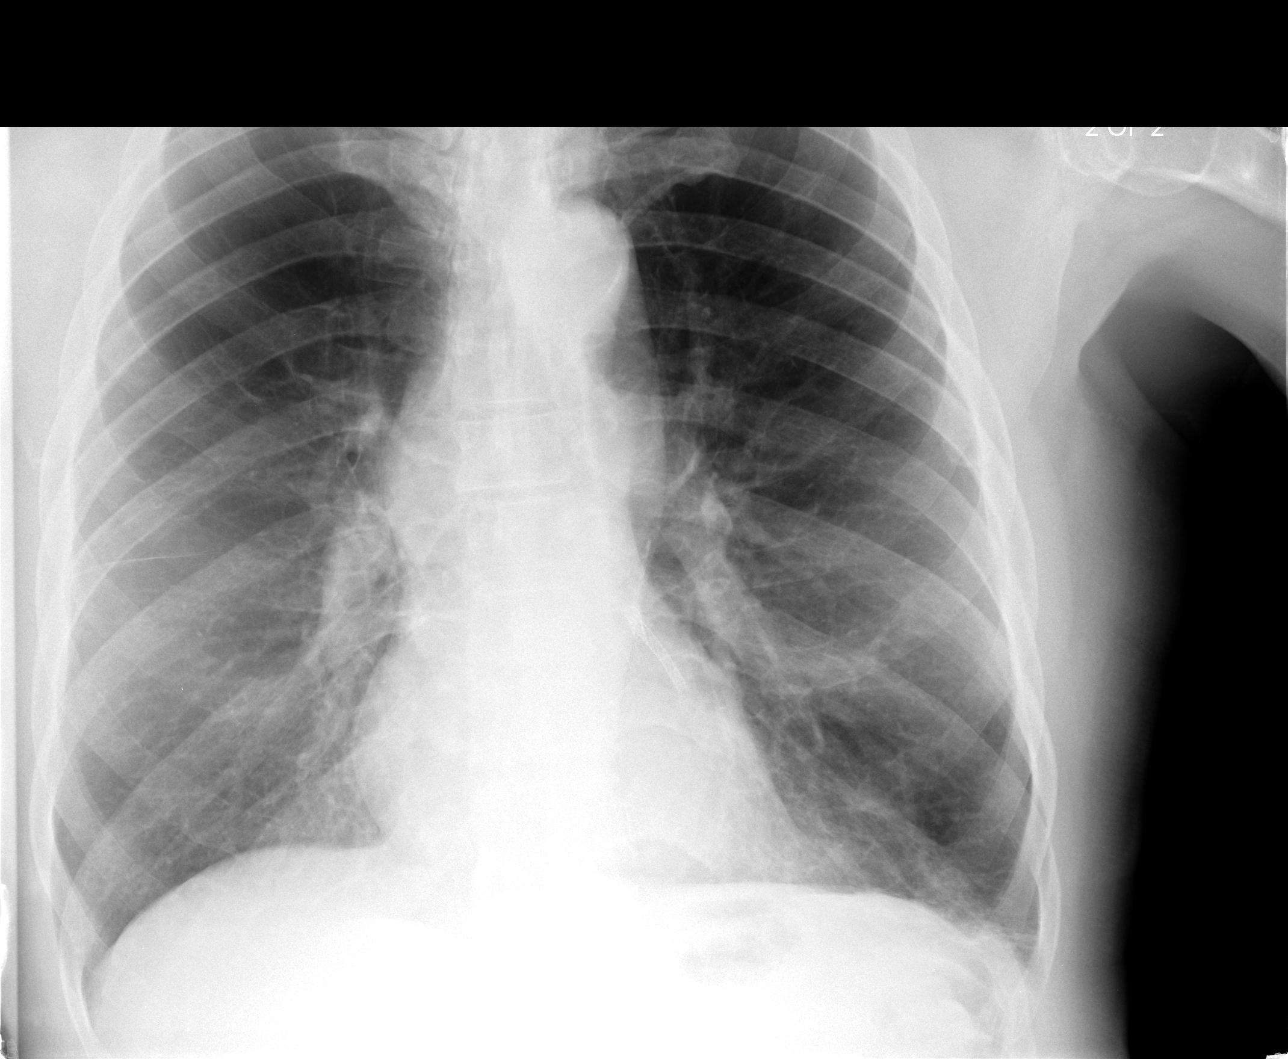

[view not recorded (3 of 3)]
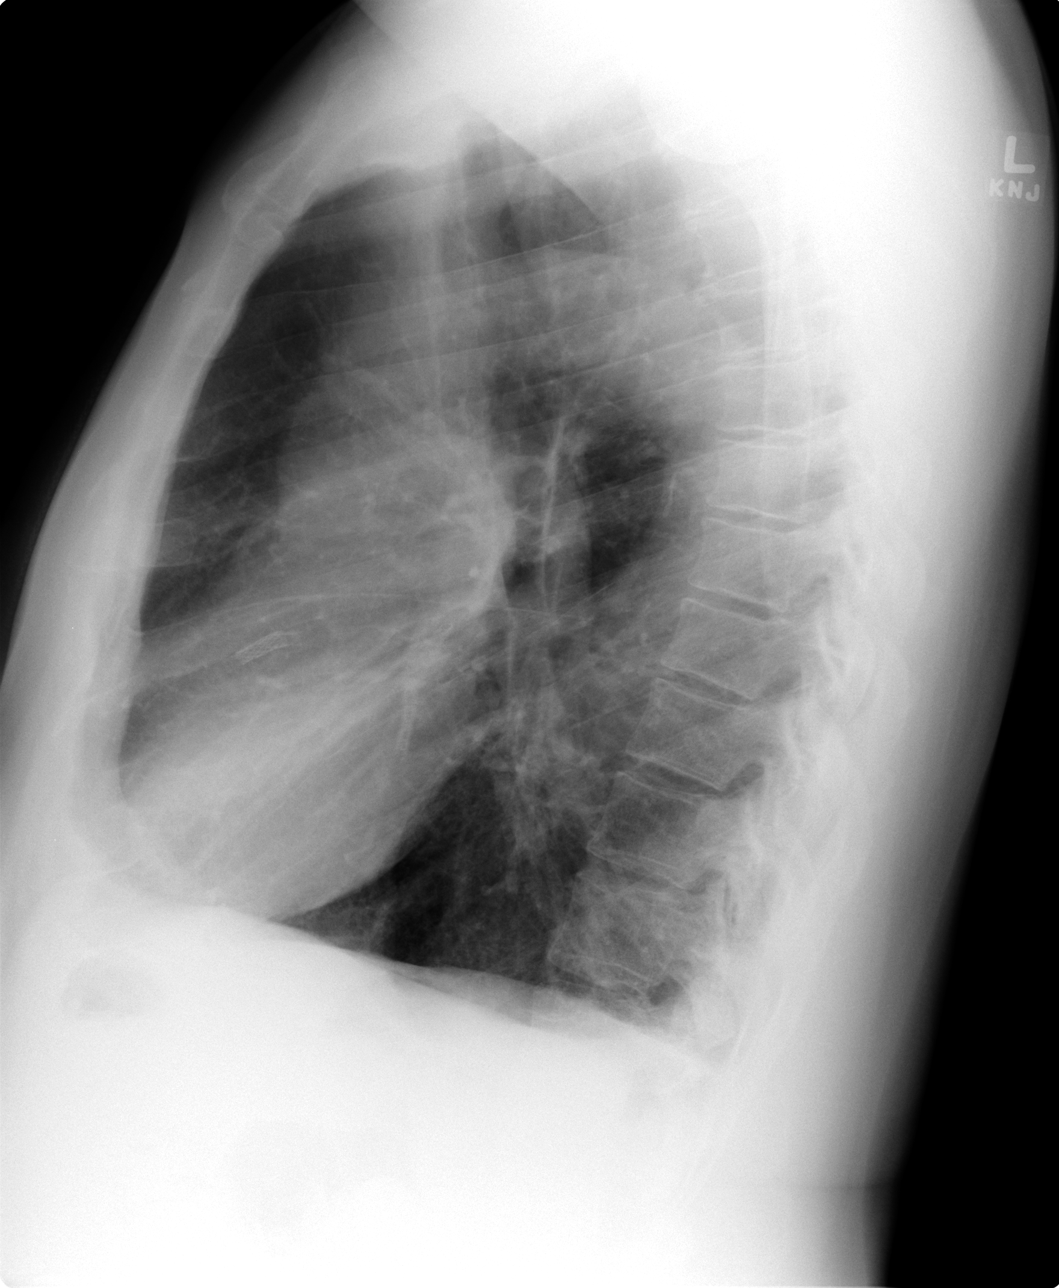

[3 of 3 positions shown; findings below may reference images not displayed]

FINDINGS: Coronary artery stent is noted. Cardiac silhouette is within normal
limits. Thoracic aorta is mildly tortuous with atherosclerotic
calcification noted, unchanged. The lungs remain hyperinflated with
mild linear opacity in the left lung base, unchanged and likely
reflecting scarring. There is no evidence of focal airspace
consolidation, edema, or pleural effusion. No acute osseous
abnormality is identified.
IMPRESSION: Stable appearance of the chest. No evidence of acute airspace
disease.

## 2014-05-27 ENCOUNTER — Telehealth: Payer: Self-pay | Admitting: *Deleted

## 2014-05-27 NOTE — Telephone Encounter (Signed)
Spoke with pt wife in lobby, aware that we do not have any samples of Symbicort 160/4.5. Coupon given for $25 copay. Advised pt wife that she can call back later this week to check for samples again. Nothing further needed.

## 2014-05-27 NOTE — Telephone Encounter (Signed)
Ms. [place], pt wife is in lobby checking on samples called in earlier

## 2014-05-29 ENCOUNTER — Telehealth: Payer: Self-pay | Admitting: Internal Medicine

## 2014-05-29 NOTE — Telephone Encounter (Signed)
1 symbicort up front for pick up  I have called pt and informed him of this  Nothing further needed

## 2014-06-05 ENCOUNTER — Telehealth: Payer: Self-pay | Admitting: Internal Medicine

## 2014-06-05 NOTE — Telephone Encounter (Signed)
Spoke with patient-states he called [employer] for assistance with Symbicort as it was costing him too much. They told him he qualified and they are faxing papers to [facility] to sign and return asap. Will forward to [place] to keep an eye out for paper work being faxed to [facility] attention.

## 2014-06-07 ENCOUNTER — Ambulatory Visit: Payer: Medicare Other | Admitting: Internal Medicine

## 2014-06-07 ENCOUNTER — Telehealth: Payer: Self-pay | Admitting: Internal Medicine

## 2014-06-07 NOTE — Telephone Encounter (Signed)
[name] May 12, 1951 - ran out of symbicort. Wants script sent to [employer] program STAT. Advised office clsoed. CHoices are - got to ER or wait at his own risk at home till  Next week and call back next week. He chose the latter   Triage: please help him out  Dr. Brand Males, M.D., [facility].C.P Pulmonary and Critical Care Medicine Staff Physician [facility] Pager: [phone], If no answer or between  15:00h - 7:00h: call [phone]  06/07/2014 11:48 AM

## 2014-06-10 NOTE — Telephone Encounter (Signed)
Spoke with pt, verified that he is already enrolled with [employer].  Called [employer] to verify this, was on hold for 17 minutes and hung up.  Wcb.

## 2014-06-10 NOTE — Telephone Encounter (Signed)
MR, to clarify, do we need to set up the patient for the [place] program or just refill medication?  Thanks!

## 2014-06-10 NOTE — Telephone Encounter (Signed)
HE is a [facility] patient. I Was on call on Friday and he called. AS best as I can understand he wanted reauthorization for the [employer] program. You could ask him. My pager was flying on Friday and this is best I understood him

## 2014-06-11 NOTE — Telephone Encounter (Signed)
Called [employer] refill line at [phone], spend 14 minutes on hold and hung up.  Wcb.

## 2014-06-12 NOTE — Telephone Encounter (Signed)
Spoke with pt, samples placed up front for him.

## 2014-06-12 NOTE — Telephone Encounter (Signed)
Magda Paganini please advise if you have received any paper work on this patient regarding Symbicort. Thanks.

## 2014-06-12 NOTE — Telephone Encounter (Signed)
Pt has not gotten rx in the mail yet. Pt would like a sample to last him until he gets it. Please call him at [phone]

## 2014-06-13 NOTE — Telephone Encounter (Signed)
I received letter asking for Dr Gustavus Bryant NPI number last wk  I faxed the information to [place] for the pt to see if they have contaced him yet

## 2014-06-17 ENCOUNTER — Encounter (HOSPITAL_COMMUNITY): Payer: Self-pay | Admitting: Psychiatry

## 2014-06-17 ENCOUNTER — Ambulatory Visit (INDEPENDENT_AMBULATORY_CARE_PROVIDER_SITE_OTHER): Payer: 59 | Admitting: Psychiatry

## 2014-06-17 VITALS — BP 143/86 | HR 103 | Ht 70.0 in | Wt 199.4 lb

## 2014-06-17 DIAGNOSIS — F330 Major depressive disorder, recurrent, mild: Secondary | ICD-10-CM

## 2014-06-17 DIAGNOSIS — F1920 Other psychoactive substance dependence, uncomplicated: Secondary | ICD-10-CM

## 2014-06-17 MED ORDER — MIRTAZAPINE 30 MG PO TABS: ORAL_TABLET | ORAL | Status: DC

## 2014-06-17 NOTE — Progress Notes (Signed)
[facility] [phone] Progress Note  [name] [id number] 64 y.o.  06/17/2014 3:50 PM  Chief Complaint:  Medication management and followup.      History of Present Illness:  [name] came for his followup appointment with his wife.  He is taking Remeron at bedtime but is helping his sleep and depression.  He endorse a lot of health issues and recently he has worsening of COPD and his chronic pain.  He is no longer taking hydrocodone and wonders what else he can take to help his pain.  He is seeing [name] on a regular basis.  His wife endorsed that he is doing better with Remeron and denies any recent crying spells, irritability, anger or any feeling of hopelessness.  He had a good Christmas.  However physically he has been deteriorated from the past.  He is using wheelchair because of shortness of breath.  He admitted smoking marijuana on and off because sometime he has no appetite.  However he denies any hallucination, paranoia, active or passive suicidal thoughts or homicidal thought.  He described his mood has been stable and there has been no recent aggression or any crying spells.  Patient denies drinking.  He denies any side effects of Remeron.  He lives with his wife who is very supportive.  Suicidal Ideation: No Plan Formed: No Patient has means to carry out plan: No  Homicidal Ideation: No Plan Formed: No Patient has means to carry out plan: No  Review of Systems: Psychiatric: Agitation: No Hallucination: No Depressed Mood: No Insomnia: No Hypersomnia: No Altered Concentration: No Feels Worthless: No Grandiose Ideas: No Belief In Special Powers: No New/Increased Substance Abuse: No Compulsions: No  Neurologic: Headache: Yes Seizure: No Paresthesias: No  Medical history Patient has history of hypertension, sleep apnea, BPH , COPD, diabetes, hyperlipidemia, coronary artery disease status post stent and arthritis.  He scheduled to have right hip  replacement on November 21.  His primary care physician is [name].  Outpatient Encounter Prescriptions as of 06/17/2014  Medication Sig  . albuterol (PROAIR HFA) 108 (90 BASE) MCG/ACT inhaler 2 puffs every 4 hours as needed only  if your can't catch your breath  . amLODipine (NORVASC) 5 MG tablet Take 5 mg by mouth at bedtime.   Marland Kitchen atenolol (TENORMIN) 25 MG tablet Take 25 mg by mouth at bedtime.   . budesonide-formoterol (SYMBICORT) 160-4.5 MCG/ACT inhaler Inhale 2 puffs into the lungs 2 (two) times daily.  . clopidogrel (PLAVIX) 75 MG tablet Take 75 mg by mouth at bedtime.   . Coenzyme Q10 (COQ10) 100 MG CAPS Take 1 capsule by mouth every morning.   Marland Kitchen dextromethorphan-guaiFENesin (MUCINEX DM) 30-600 MG per 12 hr tablet Take 1 tablet by mouth every 12 (twelve) hours as needed for cough.  . fish oil-omega-3 fatty acids 1000 MG capsule Take 1 g by mouth every morning.   . metFORMIN (GLUCOPHAGE) 500 MG tablet 2 tabs by mouth once daily at bedtime  . mirtazapine (REMERON) 30 MG tablet TAKE ONE TABLET BY MOUTH AT BEDTIME  . Multiple Vitamin (MULTIVITAMIN WITH MINERALS) TABS Take 1 tablet by mouth daily.   . nitroGLYCERIN (NITROSTAT) 0.4 MG SL tablet Place 0.4 mg under the tongue every 5 (five) minutes as needed for chest pain.  Marland Kitchen oxymetazoline (AFRIN) 0.05 % nasal spray 2 puffs twice daily x3 days, as needed  . pravastatin (PRAVACHOL) 80 MG tablet Take 80 mg by mouth at bedtime.  . Sodium Chloride-Sodium Bicarb (AYR SALINE  NASAL RINSE) 1.57 G PACK as needed.  . tadalafil (CIALIS) 20 MG tablet Take 20 mg by mouth daily as needed. For erectile dysfunction  . Testosterone (ANDROGEL PUMP) 12.5 MG/ACT (1%) GEL Apply 1 pump to each shoulder once daily  . tiotropium (SPIRIVA) 18 MCG inhalation capsule Place 1 capsule (18 mcg total) into inhaler and inhale every morning.  . vitamin B-12 (CYANOCOBALAMIN) 1000 MCG tablet Take 1,000 mcg by mouth every morning.  . [DISCONTINUED] mirtazapine (REMERON) 30 MG  tablet TAKE ONE TABLET BY MOUTH AT BEDTIME    Past Psychiatric History/Hospitalization(s): Patient has history of depression since 1990.  He has one suicidal attempt .  In the past he had tried Zoloft Paxil Prozac and Wellbutrin but he stopped due to side effects.  Patient has significant history of using cocaine marijuana and alcohol for 20 years.  He claims to be sober since she's been coming to this office.   Anxiety: Yes Bipolar Disorder: No Depression: No Mania: No Psychosis: No Schizophrenia: No Personality Disorder: No Hospitalization for psychiatric illness: Yes History of Electroconvulsive Shock Therapy: No Prior Suicide Attempts: Yes  Physical Exam: Constitutional:  BP 143/86 mmHg  Pulse 103  Ht 5\' 10"  (1.778 m)  Wt 199 lb 6.4 oz (90.447 kg)  BMI 28.61 kg/m2  General Appearance: Patient appears tired, shortness of breath and difficulty walking.    Musculoskeletal: Strength & Muscle Tone: within normal limits Gait & Station: unsteady due to pain  Patient leans: Front ROS   Psychiatric: Speech (describe rate, volume, coherence, spontaneity, and abnormalities if any): Soft clear and coherent with normal tone volume  Thought Process (describe rate, content, abstract reasoning, and computation): Logical and goal-directed  Associations: Coherent, Relevant and Intact  Thoughts: normal  Mental Status: Orientation: oriented to person, place, time/date and situation Mood & Affect: anxiety Attention Span & Concentration: Fair  Established Problem, Stable/Improving (1), Review of Psycho-Social Stressors (1), Review of Last Therapy Session (1) and Review of New Medication or Change in Dosage (2)  Assessment: Axis I: Maj. depressive disorder, polysubstance dependence in complete remission  Axis II: Deferred  Axis III: See medical history  Plan:  Patient is fairly stable on Remeron.  He has no side effects.  I recommended to consult his physician for Lidoderm  patches if not medically contraindicated since patient is afraid to take hydrocodone.  I will continue Remeron 30 mg at bedtime.  Patient like to get a hard copy so he can send to his pharmacy [facility] for 90 days.  I recommend to call us back if he has any question or concerns. I will see him again in 6 months.  Hermelinda Diegel T., MD 06/17/2014

## 2014-06-17 NOTE — Telephone Encounter (Signed)
Pt states he has not heard from from [facility] and me as of today. I have called [employer] and me at  1-[employer] [phone]), Monday through Friday, 8:00 AM-6:00 PM ET-states to allow 5 business days for the company to make their decision and reach patient. Pt is aware and states he will be here on Wednesday-will check with [place] then.    Will forward to [place] to keep an eye out for information on patient.

## 2014-06-19 ENCOUNTER — Ambulatory Visit: Payer: Medicare Other | Admitting: Internal Medicine

## 2014-06-24 ENCOUNTER — Other Ambulatory Visit (HOSPITAL_COMMUNITY): Payer: Self-pay | Admitting: Psychiatry

## 2014-06-24 ENCOUNTER — Other Ambulatory Visit: Payer: Self-pay | Admitting: Internal Medicine

## 2014-06-24 MED ORDER — BUDESONIDE-FORMOTEROL FUMARATE 160-4.5 MCG/ACT IN AERO: 2.0000 | INHALATION_SPRAY | Freq: Two times a day (BID) | RESPIRATORY_TRACT | Status: DC

## 2014-06-24 NOTE — Telephone Encounter (Signed)
No I have not

## 2014-06-24 NOTE — Telephone Encounter (Signed)
Given 90 days on 04/17/15

## 2014-06-24 NOTE — Telephone Encounter (Signed)
Have we received anything on this patient? Thanks! 

## 2014-06-24 NOTE — Telephone Encounter (Signed)
Telephone call from patient reporting it was going to still take several days, 1 week to 10 days before he would receive his new 90 day supply of Remeron from [facility] that he switched over to for medications.  Patient requested a 10 day supply from [place] [phone] at [facility] to last until Remeron arrived.  Patient reported being out and did not want to try going any longer without the medication.  Would like 10 days supply to be authorized.

## 2014-06-24 NOTE — Telephone Encounter (Signed)
Spoke with the pt and advised that we will need to resubmit new forms  He verbalized understanding  He has appt this wk and we will complete then

## 2014-06-24 NOTE — Telephone Encounter (Signed)
Spoke with Marvis(Supervisor) at [facility] and Me - states they have nothing on file for this patient. They have not received an application or an Rx for the medication needing coverage (Symbicort).  I advised Marvis that per our records we have documentation that information was sent to [facility] regarding this patient, per [facility] they have nothing in the system for this patient.  Marvis states that they need application for assistance, along with Rx for medication with NPI of MD listed on Rx. This all needs to be refaxed to [phone]  Please advise Magda Paganini if you have this documentation([place] Application with Rx) to be re-faxed. I do not see where it has been scanned into chart.

## 2014-06-25 ENCOUNTER — Other Ambulatory Visit (HOSPITAL_COMMUNITY): Payer: Self-pay | Admitting: Psychiatry

## 2014-06-26 ENCOUNTER — Other Ambulatory Visit (HOSPITAL_COMMUNITY): Payer: Self-pay | Admitting: Psychiatry

## 2014-06-26 ENCOUNTER — Encounter: Payer: Self-pay | Admitting: Internal Medicine

## 2014-06-26 ENCOUNTER — Ambulatory Visit (INDEPENDENT_AMBULATORY_CARE_PROVIDER_SITE_OTHER): Payer: Medicare Other | Admitting: Internal Medicine

## 2014-06-26 ENCOUNTER — Telehealth (HOSPITAL_COMMUNITY): Payer: Self-pay

## 2014-06-26 VITALS — BP 142/90 | HR 105 | Ht 70.0 in | Wt 200.0 lb

## 2014-06-26 DIAGNOSIS — J449 Chronic obstructive pulmonary disease, unspecified: Secondary | ICD-10-CM | POA: Diagnosis not present

## 2014-06-26 DIAGNOSIS — Z720 Tobacco use: Secondary | ICD-10-CM | POA: Diagnosis not present

## 2014-06-26 DIAGNOSIS — F17200 Nicotine dependence, unspecified, uncomplicated: Secondary | ICD-10-CM

## 2014-06-26 DIAGNOSIS — F17210 Nicotine dependence, cigarettes, uncomplicated: Secondary | ICD-10-CM

## 2014-06-26 DIAGNOSIS — J9611 Chronic respiratory failure with hypoxia: Secondary | ICD-10-CM

## 2014-06-26 NOTE — Telephone Encounter (Signed)
Telephone call with patient to verify he still needed a 10 day supply of Remeron to last until his requested 90 day mail supply arrives.  Patient reported he filled a past left 30 days supply at his local pharmacy so now was fine until his 90 day supply arrived and no current order needed.

## 2014-06-26 NOTE — Patient Instructions (Signed)
Eat more fish / especially baked salmon for your omega 3s  Only use your albuterol as a rescue medication to be used if you can't catch your breath by resting or doing a relaxed purse lip breathing pattern.  - The less you use it, the better it will work when you need it. - Ok to use up to 2 puffs  every 4 hours if you must but call for immediate appointment if use goes up over your usual need - Don't leave home without it !!  (think of it like the spare tire for your car)   The key is to stop smoking completely before smoking completely stops you!   Please schedule a follow up visit in 4 months but call sooner if needed

## 2014-06-26 NOTE — Telephone Encounter (Signed)
Given on 06/17/14. Too soon to refill.

## 2014-06-26 NOTE — Progress Notes (Signed)
Subjective:    Patient ID: [name], male    DOB: [birth year]  MRN: [id number]    Brief patient profile:  62 yobm active smoker on disability due to depression since 1994 baseline on spiriva with  doe room to room s 02 then underwent L THR at [facility] and noted even in hosp need 02 with any activity so   referred to pulmonary clinic 05/01/2013 by [facility] with GOLD III copd with mild reversibility documented 06/12/13    History of Present Illness  05/01/2013 1st [facility] office visit/ [name] cc doe x room to room if not on 02 using walker ever since mobilized from surgery 04/03/13 s cough, some chest tightness once or twice per week esp after cigarette use, has not tried inhalers at home (other than spiriva)  but nebs in hosp seemed to help rec Start symbicort 160 Take 2 puffs first thing in am and then another 2 puffs about 12 hours later.  Continue spiriva one in am Work on inhaler technique:    Please remember to go to the lab and x-ray department downstairs for your tests - we will call you with the results when they are available. The key is to stop smoking completely before smoking completely stops you!   08/30/2013 f/u ov/[name] re: GOLD III copd with reversibilty on spiriva and symbicort 160 2bid Chief Complaint  Patient presents with  . Follow-up    Pt states breathing has been worse since for the past 2 days since he ran out of symbicort. Did not bring his formulary list from ins.   confused with instructions on how to use meds, esp saba, but now limited by hip >> sob so w/c bound and still smoking  >>rec Continue your present medications but work on hard on not smoking - this is the most important aspect of your care! Ok to use the 02 with activity, not needed at rest  Only use your albuterol (proair) as a rescue medication   09/14/2013 Follow up  Returns for follow up and med review  We reviewed all his medications and organized them into a medication calendar with  patient education. It appears that he is taking his medications correctly. It is hard for him to afford all of his co-pays at times. We discussed possible options w/ his formulary.-at this time cont on current regimen . If becomes too expensive will look at neb in place of maintenance meds .  rec Continue on current regimen  Work on not smoking  Follow med calendar closely and bring to each visit.    03/07/2014 f/u ov/[name] re: copd GOLD III/ no med calendar / almost  Quit smoking x one month  Chief Complaint  Patient presents with  . Follow-up    Breathing has improved. Has not had to use rescue inhaler.  No new co's today.  Not limited by breathing from desired activities  But rather by hip > still mostly w/c dep   06/26/2014 f/u ov/[name] re: copd/ still active smoker / no med calendar  Chief Complaint  Patient presents with  . Follow-up    Pt states breathing is some worse today- relates to taking pain meds.   uses cpap s 02 at bedtime and with walking using 2lpm  Worse breathing may correlate with more hip pain  Only using proair 3 x month  cig after meals toughest to leave off but smoking about 6 /day    No obvious day to day or  daytime variabilty or assoc chronic cough or cp or chest tightness, subjective wheeze overt sinus or hb symptoms. No unusual exp hx or h/o childhood pna/ asthma or knowledge of premature birth.  Sleeping ok without nocturnal  or early am exacerbation  of respiratory  c/o's or need for noct saba. Also denies any obvious fluctuation of symptoms with weather or environmental changes or other aggravating or alleviating factors except as outlined above   Current Medications, Allergies, Complete Past Medical History, Past Surgical History, Family History, and Social History were reviewed in [employer] record.       ROS  The following are not active complaints unless bolded sore throat, dysphagia, dental problems, itching, sneezing,   nasal congestion or excess/ purulent secretions, ear ache,   fever, chills, sweats, unintended wt loss, pleuritic or exertional cp, hemoptysis,  orthopnea pnd or leg swelling, presyncope, palpitations, heartburn, abdominal pain, anorexia, nausea, vomiting, diarrhea  or change in bowel or urinary habits, change in stools or urine, dysuria,hematuria,  rash, arthralgias, visual complaints, headache, numbness weakness or ataxia or problems with walking due to L Hip or coordination,  change in mood/affect or memory.         .           Objective:   Physical Exam  W/c bm nad   06/12/2013      200 >  08/30/2013  189 >186 09/14/2013 > 10/30/2013  184> 01/28/2014  184 > 03/06/14  194 > 06/26/2014  200  HEENT mild turbinate edema.  Oropharynx no thrush or excess pnd or cobblestoning.  No JVD or cervical adenopathy. Mild accessory muscle hypertrophy. Trachea midline, nl thryroid. Chest was hyperinflated by percussion with diminished breath sounds and moderate increased exp time with mid exp wheeze. Hoover sign positive at mid inspiration. Regular rate and rhythm without murmur gallop or rub or increase P2 or edema.  Abd: no hsm, nl excursion. Ext warm without cyanosis or clubbing.    CXR  01/28/2014 : 1. No acute cardiopulmonary disease. 2. COPD.      Assessment & Plan:

## 2014-06-28 ENCOUNTER — Encounter: Payer: Self-pay | Admitting: Internal Medicine

## 2014-06-28 NOTE — Assessment & Plan Note (Signed)
>   3 min discussion  I emphasized that although we never turn away smokers from the pulmonary clinic, we do ask that they understand that the recommendations that we make  won't work nearly as well in the presence of continued cigarette exposure.  In fact, we may very well  reach a point where we can't promise to help the patient if he/she can't quit smoking. (We can and will promise to try to help, we just can't promise what we recommend will really work)   Discussed uncoupling the behavior from the smoking (eg smoke before dinner, not after since he says this is the toughest to give up)  Vista for chantix/ nicotine replacement but should come from primary care as we will only be seeing a couple of times a year in this clinic

## 2014-06-28 NOTE — Assessment & Plan Note (Signed)
-   PFT's 06/12/13  FEV1  1.15 (37%) with ratrio 43 and 14% better p B2 p no rx day of study and DLCO 29% -09/14/2013 med calendar > did not bring to office as requested 10/30/13 or 01/28/14 or 06/27/14  - 01/28/2014 p extensive coaching HFA effectiveness =    75%, 100% with dpi   Severe copd but as he is mostly w/c bound is not as symptomatic as he would otherwise be and unfortunately still smoking (see sep a/p)  Relatively stable on spiriva and symbicort    Each maintenance medication was reviewed in detail including most importantly the difference between maintenance and as needed and under what circumstances the prns are to be used.  Please see instructions for details which were reviewed in writing and the patient given a copy.

## 2014-06-28 NOTE — Assessment & Plan Note (Signed)
Placed on 02 at discharge p THR 03/2013 3h/day and cpap at hs RA sats at rest 94% 06/27/14 so rec use 02 with activity only   Adequate control on present rx, reviewed > no change in rx needed

## 2014-07-10 ENCOUNTER — Telehealth: Payer: Self-pay | Admitting: Internal Medicine

## 2014-07-10 MED ORDER — BUDESONIDE-FORMOTEROL FUMARATE 160-4.5 MCG/ACT IN AERO: 2.0000 | INHALATION_SPRAY | Freq: Two times a day (BID) | RESPIRATORY_TRACT | Status: DC

## 2014-07-10 NOTE — Telephone Encounter (Signed)
Spoke with pt, states he needs a sample of symbicort to use until his med gets approved.  This has been placed up front for pickup.  Nothing further needed.

## 2014-07-24 DIAGNOSIS — J449 Chronic obstructive pulmonary disease, unspecified: Secondary | ICD-10-CM | POA: Diagnosis not present

## 2014-08-05 ENCOUNTER — Other Ambulatory Visit: Payer: Self-pay | Admitting: Gastroenterology

## 2014-08-05 DIAGNOSIS — J449 Chronic obstructive pulmonary disease, unspecified: Secondary | ICD-10-CM | POA: Diagnosis not present

## 2014-08-05 DIAGNOSIS — Z1211 Encounter for screening for malignant neoplasm of colon: Secondary | ICD-10-CM | POA: Diagnosis not present

## 2014-08-05 DIAGNOSIS — I2510 Atherosclerotic heart disease of native coronary artery without angina pectoris: Secondary | ICD-10-CM | POA: Diagnosis not present

## 2014-08-14 DIAGNOSIS — G4730 Sleep apnea, unspecified: Secondary | ICD-10-CM | POA: Diagnosis not present

## 2014-08-14 DIAGNOSIS — G4733 Obstructive sleep apnea (adult) (pediatric): Secondary | ICD-10-CM | POA: Diagnosis not present

## 2014-08-16 ENCOUNTER — Encounter (HOSPITAL_COMMUNITY): Payer: Self-pay | Admitting: *Deleted

## 2014-08-20 DIAGNOSIS — Z0000 Encounter for general adult medical examination without abnormal findings: Secondary | ICD-10-CM | POA: Diagnosis not present

## 2014-08-24 DIAGNOSIS — J449 Chronic obstructive pulmonary disease, unspecified: Secondary | ICD-10-CM | POA: Diagnosis not present

## 2014-08-29 NOTE — Progress Notes (Signed)
I instructed [name] to bring the 2 Herbal medications that he has added with him, so that we may add them to his list.  I also instructed patient to not take any additional Herbal medications until instructed by MD after procedure.  Patient reported that he stopped Plavix Fri, April 8 or Sunday, April 10.

## 2014-08-30 ENCOUNTER — Encounter (HOSPITAL_COMMUNITY)
Admission: RE | Disposition: A | Discharge status: Home / Self Care | Payer: Self-pay | Source: Ambulatory Visit | Attending: Gastroenterology

## 2014-08-30 ENCOUNTER — Ambulatory Visit (HOSPITAL_COMMUNITY): Payer: Medicare Other | Admitting: Certified Registered Nurse Anesthetist

## 2014-08-30 ENCOUNTER — Ambulatory Visit (HOSPITAL_COMMUNITY)
Admission: RE | Admit: 2014-08-30 | Discharge: 2014-08-30 | Disposition: A | Discharge status: Home / Self Care | Payer: Medicare Other | Source: Ambulatory Visit | Attending: Gastroenterology | Admitting: Gastroenterology

## 2014-08-30 ENCOUNTER — Encounter (HOSPITAL_COMMUNITY): Payer: Self-pay | Admitting: *Deleted

## 2014-08-30 DIAGNOSIS — K5520 Angiodysplasia of colon without hemorrhage: Secondary | ICD-10-CM | POA: Insufficient documentation

## 2014-08-30 DIAGNOSIS — J449 Chronic obstructive pulmonary disease, unspecified: Secondary | ICD-10-CM | POA: Insufficient documentation

## 2014-08-30 DIAGNOSIS — I2510 Atherosclerotic heart disease of native coronary artery without angina pectoris: Secondary | ICD-10-CM | POA: Insufficient documentation

## 2014-08-30 DIAGNOSIS — D127 Benign neoplasm of rectosigmoid junction: Secondary | ICD-10-CM | POA: Insufficient documentation

## 2014-08-30 DIAGNOSIS — E119 Type 2 diabetes mellitus without complications: Secondary | ICD-10-CM | POA: Diagnosis not present

## 2014-08-30 DIAGNOSIS — D123 Benign neoplasm of transverse colon: Secondary | ICD-10-CM | POA: Diagnosis not present

## 2014-08-30 DIAGNOSIS — K5730 Diverticulosis of large intestine without perforation or abscess without bleeding: Secondary | ICD-10-CM | POA: Diagnosis not present

## 2014-08-30 DIAGNOSIS — F17200 Nicotine dependence, unspecified, uncomplicated: Secondary | ICD-10-CM | POA: Diagnosis not present

## 2014-08-30 DIAGNOSIS — A630 Anogenital (venereal) warts: Secondary | ICD-10-CM | POA: Insufficient documentation

## 2014-08-30 DIAGNOSIS — Z1211 Encounter for screening for malignant neoplasm of colon: Secondary | ICD-10-CM | POA: Diagnosis not present

## 2014-08-30 DIAGNOSIS — Z9989 Dependence on other enabling machines and devices: Secondary | ICD-10-CM | POA: Diagnosis not present

## 2014-08-30 DIAGNOSIS — G4730 Sleep apnea, unspecified: Secondary | ICD-10-CM | POA: Insufficient documentation

## 2014-08-30 DIAGNOSIS — K219 Gastro-esophageal reflux disease without esophagitis: Secondary | ICD-10-CM | POA: Insufficient documentation

## 2014-08-30 DIAGNOSIS — I252 Old myocardial infarction: Secondary | ICD-10-CM | POA: Diagnosis not present

## 2014-08-30 DIAGNOSIS — D122 Benign neoplasm of ascending colon: Secondary | ICD-10-CM | POA: Diagnosis not present

## 2014-08-30 DIAGNOSIS — Z79899 Other long term (current) drug therapy: Secondary | ICD-10-CM | POA: Diagnosis not present

## 2014-08-30 HISTORY — PX: COLONOSCOPY WITH PROPOFOL: SHX5780

## 2014-08-30 LAB — GLUCOSE, CAPILLARY: Glucose-Capillary: 116 mg/dL — ABNORMAL HIGH (ref 70–99)

## 2014-08-30 SURGERY — COLONOSCOPY WITH PROPOFOL
Anesthesia: Monitor Anesthesia Care

## 2014-08-30 MED ORDER — SODIUM CHLORIDE 0.9 % IV SOLN: INTRAVENOUS | Status: DC

## 2014-08-30 MED ORDER — LACTATED RINGERS IV SOLN
INTRAVENOUS | Status: DC
  Administered 2014-08-30: 1000 mL via INTRAVENOUS
  Administered 2014-08-30: 09:00:00 via INTRAVENOUS

## 2014-08-30 MED ORDER — LIDOCAINE HCL (CARDIAC) 20 MG/ML IV SOLN
INTRAVENOUS | Status: DC | PRN
  Administered 2014-08-30: 50 mg via INTRAVENOUS
  Administered 2014-08-30: 30 mg via INTRAVENOUS

## 2014-08-30 MED ORDER — PROPOFOL INFUSION 10 MG/ML OPTIME
INTRAVENOUS | Status: DC | PRN
  Administered 2014-08-30: 100 ug/kg/min via INTRAVENOUS

## 2014-08-30 NOTE — Transfer of Care (Signed)
Immediate Anesthesia Transfer of Care Note  Patient: [name]  Procedure(s) Performed: Procedure(s) with comments: COLONOSCOPY WITH PROPOFOL (N/A) - h&p in file   Patient Location: PACU  Anesthesia Type:MAC  Level of Consciousness: awake, patient cooperative and responds to stimulation  Airway & Oxygen Therapy: Patient Spontanous Breathing and Patient connected to face mask oxygen  Post-op Assessment: Report given to RN and Post -op Vital signs reviewed and stable  Post vital signs: Reviewed and stable  Last Vitals:  Filed Vitals:   08/30/14 0754  BP: 128/87  Temp: 36.7 C  Resp: 17    Complications: No apparent anesthesia complications

## 2014-08-30 NOTE — H&P (View-Only) (Signed)
I instructed [name] to bring the 2 Herbal medications that he has added with him, so that we may add them to his list.  I also instructed patient to not take any additional Herbal medications until instructed by MD after procedure.  Patient reported that he stopped Plavix Fri, April 8 or Sunday, April 10.

## 2014-08-30 NOTE — Anesthesia Preprocedure Evaluation (Signed)
Anesthesia Evaluation  Patient identified by MRN, date of birth, ID band Patient awake    Reviewed: Allergy & Precautions, NPO status , Patient's Chart, lab work & pertinent test results  Airway Mallampati: I  TM Distance: >3 FB Neck ROM: Full    Dental  (+) Teeth Intact, Dental Advisory Given   Pulmonary sleep apnea and Continuous Positive Airway Pressure Ventilation , COPD COPD inhaler, Current Smoker,  breath sounds clear to auscultation        Cardiovascular hypertension, Pt. on medications + CAD and + Past MI Rhythm:Regular Rate:Normal     Neuro/Psych    GI/Hepatic GERD-  Controlled and Medicated,  Endo/Other  diabetes  Renal/GU      Musculoskeletal   Abdominal   Peds  Hematology   Anesthesia Other Findings   Reproductive/Obstetrics                             Anesthesia Physical Anesthesia Plan  ASA: III  Anesthesia Plan: MAC   Post-op Pain Management:    Induction: Intravenous  Airway Management Planned: Simple Face Mask  Additional Equipment:   Intra-op Plan:   Post-operative Plan: Extubation in OR  Informed Consent: I have reviewed the patients History and Physical, chart, labs and discussed the procedure including the risks, benefits and alternatives for the proposed anesthesia with the patient or authorized representative who has indicated his/her understanding and acceptance.   Dental advisory given  Plan Discussed with: CRNA, Anesthesiologist and Surgeon  Anesthesia Plan Comments:         Anesthesia Quick Evaluation

## 2014-08-30 NOTE — Anesthesia Postprocedure Evaluation (Signed)
Anesthesia Post Note  Patient: [name]  Procedure(s) Performed: Procedure(s) (LRB): COLONOSCOPY WITH PROPOFOL (N/A)  Anesthesia type: MAC  Patient location: PACU  Post pain: Pain level controlled  Post assessment: Post-op Vital signs reviewed  Last Vitals: BP 141/88 mmHg  Pulse 86  Temp(Src) 36.7 C (Oral)  Resp 19  Ht 5\' 10"  (1.778 m)  Wt 200 lb (90.719 kg)  BMI 28.70 kg/m2  SpO2 96%  Post vital signs: Reviewed  Level of consciousness: awake  Complications: No apparent anesthesia complications

## 2014-08-30 NOTE — Discharge Instructions (Signed)
Colonoscopy, Care After °These instructions give you information on caring for yourself after your procedure. Your doctor may also give you more specific instructions. Call your doctor if you have any problems or questions after your procedure. °HOME CARE °· Do not drive for 24 hours. °· Do not sign important papers or use machinery for 24 hours. °· You may shower. °· You may go back to your usual activities, but go slower for the first 24 hours. °· Take rest breaks often during the first 24 hours. °· Walk around or use warm packs on your belly (abdomen) if you have belly cramping or gas. °· Drink enough fluids to keep your pee (urine) clear or pale yellow. °· Resume your normal diet. Avoid heavy or fried foods. °· Avoid drinking alcohol for 24 hours or as told by your doctor. °· Only take medicines as told by your doctor. °If a tissue sample (biopsy) was taken during the procedure:  °· Do not take aspirin or blood thinners for 7 days, or as told by your doctor. °· Do not drink alcohol for 7 days, or as told by your doctor. °· Eat soft foods for the first 24 hours. °GET HELP IF: °You still have a small amount of blood in your poop (stool) 2-3 days after the procedure. °GET HELP RIGHT AWAY IF: °· You have more than a small amount of blood in your poop. °· You see clumps of tissue (blood clots) in your poop. °· Your belly is puffy (swollen). °· You feel sick to your stomach (nauseous) or throw up (vomit). °· You have a fever. °· You have belly pain that gets worse and medicine does not help. °MAKE SURE YOU: °· Understand these instructions. °· Will watch your condition. °· Will get help right away if you are not doing well or get worse. °Document Released: 06/05/2010 Document Revised: 05/08/2013 Document Reviewed: 01/08/2013 °Exit[employer]® Patient Information ©2015 [employer]. This information is not intended to replace advice given to you by your health care provider. Make sure you discuss any questions you have with  your health care provider. ° °

## 2014-08-30 NOTE — Op Note (Signed)
[facility] [place], [address]   COLONOSCOPY PROCEDURE REPORT  PATIENT: [name], [name]  MR#: [id number] BIRTHDATE: 03-24-1951 , 41  yrs. old GENDER: male ENDOSCOPIST: Carol Ada, MD REFERRED BY: PROCEDURE DATE:  09-07-2014 PROCEDURE:   Colonoscopy with snare polypectomy ASA CLASS:   Class III INDICATIONS: Screening MEDICATIONS: Monitored anesthesia care  DESCRIPTION OF PROCEDURE:   After the risks and benefits and of the procedure were explained, informed consent was obtained.  revealed anal condyloma.    The Pentax Ped Colon [id number]  endoscope was introduced through the anus and advanced to the cecum, which was identified by both the appendix and ileocecal valve .  The quality of the prep was adequate .  The instrument was then slowly withdrawn as the colon was fully examined. Estimated blood loss is zero unless otherwise noted in this procedure report.     FINDINGS: In the transverse colon two 3-4 mm sessile polyps were removed with a cold snare.  in the rectosigmoid colon two 2-3 mm sessile polyps were removed with a cold snare.  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  Scattered left sided diverticula were identified.  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            The scope was then withdrawn from the patient and the procedure completed.  WITHDRAWAL TIME:  COMPLICATIONS: There were no immediate complications. ENDOSCOPIC IMPRESSION: 1) Colonic polyps. 2) Small nonbleeding Hepatic flexure AVMs. 3) Diverticula. 4) Residual anal condyloma. RECOMMENDATIONS: 1) Follow up biopsy results. 2) Repeat the colonoscopy in 3-5 years. 3) Okay to resume Plavix. 4) Follow up with Surgery for the condyloma.  REPEAT EXAM:  cc:  _______________________________ eSignedCarol Ada, MD September 07, 2014 9:47 AM   CPT CODES: ICD CODES:  The ICD and CPT codes recommended by this  software are interpretations from the data that the clinical staff has captured with the software.  The verification of the translation of this report to the ICD and CPT codes and modifiers is the sole responsibility of the health care institution and practicing physician where this report was generated.  [employer]. will not be held responsible for the validity of the ICD and CPT codes included on this report.  AMA assumes no liability for data contained or not contained herein. CPT is a Designer, television/film set of the [employer].   PATIENT NAME:  [name], [name] MR#: [id number]

## 2014-08-30 NOTE — Interval H&P Note (Signed)
History and Physical Interval Note:  08/30/2014 9:06 AM  [name]  has presented today for surgery, with the diagnosis of screening  The various methods of treatment have been discussed with the patient and family. After consideration of risks, benefits and other options for treatment, the patient has consented to  Procedure(s) with comments: COLONOSCOPY WITH PROPOFOL (N/A) - h&p in file  as a surgical intervention .  [name] history has been reviewed, patient examined, no change in status, stable for surgery.  I have reviewed the patient's chart and labs.  Questions were answered to the patient's satisfaction.     Isac Lincks D

## 2014-09-02 ENCOUNTER — Encounter (HOSPITAL_COMMUNITY): Payer: Self-pay | Admitting: Gastroenterology

## 2014-09-13 DIAGNOSIS — G4730 Sleep apnea, unspecified: Secondary | ICD-10-CM | POA: Diagnosis not present

## 2014-09-13 DIAGNOSIS — G4733 Obstructive sleep apnea (adult) (pediatric): Secondary | ICD-10-CM | POA: Diagnosis not present

## 2014-09-23 DIAGNOSIS — K6289 Other specified diseases of anus and rectum: Secondary | ICD-10-CM | POA: Diagnosis not present

## 2014-09-23 DIAGNOSIS — J449 Chronic obstructive pulmonary disease, unspecified: Secondary | ICD-10-CM | POA: Diagnosis not present

## 2014-10-14 DIAGNOSIS — G4730 Sleep apnea, unspecified: Secondary | ICD-10-CM | POA: Diagnosis not present

## 2014-10-14 DIAGNOSIS — G4733 Obstructive sleep apnea (adult) (pediatric): Secondary | ICD-10-CM | POA: Diagnosis not present

## 2014-10-22 ENCOUNTER — Other Ambulatory Visit (HOSPITAL_COMMUNITY): Payer: Self-pay | Admitting: Psychiatry

## 2014-10-22 ENCOUNTER — Telehealth (HOSPITAL_COMMUNITY): Payer: Self-pay

## 2014-10-22 DIAGNOSIS — F330 Major depressive disorder, recurrent, mild: Secondary | ICD-10-CM

## 2014-10-22 NOTE — Telephone Encounter (Signed)
Medication refill request received from [facility] for a refill of patient's 90 day order of Mirtazapine.  Patient last seen on 06/17/14 and does not return until 12/16/14.  A new 90 day prescription was printed for patient on 06/17/14.

## 2014-10-23 MED ORDER — MIRTAZAPINE 30 MG PO TABS: ORAL_TABLET | ORAL | Status: DC

## 2014-10-23 NOTE — Telephone Encounter (Signed)
Patient called with request for mirtazapine 90 day refill order to be sent to [facility].  Patient stated he could not afford prescription from [place].  Discussed with Dr. Adele Schilder who authorized a 90 day order be sent to [facility]. Called patient back to inform new order was e-scribed to [facility] and confirmed received this date.

## 2014-10-24 DIAGNOSIS — J449 Chronic obstructive pulmonary disease, unspecified: Secondary | ICD-10-CM | POA: Diagnosis not present

## 2014-11-13 DIAGNOSIS — G4733 Obstructive sleep apnea (adult) (pediatric): Secondary | ICD-10-CM | POA: Diagnosis not present

## 2014-11-13 DIAGNOSIS — G4730 Sleep apnea, unspecified: Secondary | ICD-10-CM | POA: Diagnosis not present

## 2014-11-14 ENCOUNTER — Telehealth: Payer: Self-pay | Admitting: Internal Medicine

## 2014-11-14 DIAGNOSIS — J449 Chronic obstructive pulmonary disease, unspecified: Secondary | ICD-10-CM

## 2014-11-14 NOTE — Telephone Encounter (Signed)
Pt due for APPT w/ MW Called and LMTCB x1

## 2014-11-15 NOTE — Telephone Encounter (Signed)
Recall is in computer to schedule appt for June appt - patient was never called to schedule.  Made appt for 11/21/14 with MW at 2:30 for 4 mo ROV. Pt would like to know if we can go ahead and send an order for a POC eval - states that he needs this ASAP as he is unable to [facility] the [facility] and do his exercising.  Feels that he is stuck in his home and unable to get out and about d/t his O2 tying him down.  Please advise Dr Melvyn Novas. Thanks.

## 2014-11-15 NOTE — Telephone Encounter (Signed)
Fine with me

## 2014-11-15 NOTE — Telephone Encounter (Signed)
Spoke with pt. Advised him that we will send over the order to [facility] for his evaluation. Nothing further was needed at this time.

## 2014-11-21 ENCOUNTER — Ambulatory Visit: Payer: Medicare Other | Admitting: Internal Medicine

## 2014-11-23 DIAGNOSIS — J449 Chronic obstructive pulmonary disease, unspecified: Secondary | ICD-10-CM | POA: Diagnosis not present

## 2014-11-26 ENCOUNTER — Telehealth: Payer: Self-pay | Admitting: Internal Medicine

## 2014-11-26 DIAGNOSIS — E119 Type 2 diabetes mellitus without complications: Secondary | ICD-10-CM | POA: Diagnosis not present

## 2014-11-26 DIAGNOSIS — M130 Polyarthritis, unspecified: Secondary | ICD-10-CM | POA: Diagnosis not present

## 2014-11-26 DIAGNOSIS — I10 Essential (primary) hypertension: Secondary | ICD-10-CM | POA: Diagnosis not present

## 2014-11-26 DIAGNOSIS — J449 Chronic obstructive pulmonary disease, unspecified: Secondary | ICD-10-CM | POA: Diagnosis not present

## 2014-11-26 NOTE — Telephone Encounter (Signed)
lmtcb x1 for pt appt scheduled to see MW 7/15

## 2014-11-27 NOTE — Telephone Encounter (Signed)
Pt states that [facility] came out last week and evaluated him for POC and they told him that he needs a POC that goes up to 5L on continuous flow with exertion.  They only have a machine that goes up to 3L continous.  Pt states that he has always used 2L Pulse with exertion only.  I advised pt to keep appt with Dr wert on 11/29/14 and we could recheck his walking sats and discuss with Dr Melvyn Novas at that time.  PT verbalized understanding.

## 2014-11-29 ENCOUNTER — Ambulatory Visit (INDEPENDENT_AMBULATORY_CARE_PROVIDER_SITE_OTHER): Payer: Medicare Other | Admitting: Internal Medicine

## 2014-11-29 ENCOUNTER — Encounter: Payer: Self-pay | Admitting: Internal Medicine

## 2014-11-29 VITALS — BP 130/90 | HR 106 | Ht 70.0 in | Wt 200.0 lb

## 2014-11-29 DIAGNOSIS — J449 Chronic obstructive pulmonary disease, unspecified: Secondary | ICD-10-CM | POA: Diagnosis not present

## 2014-11-29 DIAGNOSIS — J9611 Chronic respiratory failure with hypoxia: Secondary | ICD-10-CM | POA: Diagnosis not present

## 2014-11-29 NOTE — Progress Notes (Signed)
Subjective:    Patient ID: [name], male    DOB: January 08, 1951  MRN: [id number]    Brief patient profile:  2 yobm quit smoking 12/03/14 on disability due to depression since 1994 baseline on spiriva with  doe room to room s 02 then underwent L THR at [facility] and noted even in hosp need 02 with any activity so referred to pulmonary clinic 05/01/2013 by [facility] with GOLD III copd with mild reversibility documented 06/12/13    History of Present Illness  05/01/2013 1st [facility] office visit/ Wert cc doe x room to room if not on 02 using walker ever since mobilized from surgery 04/03/13 s cough, some chest tightness once or twice per week esp after cigarette use, has not tried inhalers at home (other than spiriva)  but nebs in hosp seemed to help rec Start symbicort 160 Take 2 puffs first thing in am and then another 2 puffs about 12 hours later.  Continue spiriva one in am Work on inhaler technique:    Please remember to go to the lab and x-ray department downstairs for your tests - we will call you with the results when they are available. The key is to stop smoking completely before smoking completely stops you!   08/30/2013 f/u ov/Wert re: GOLD III copd with reversibilty on spiriva and symbicort 160 2bid Chief Complaint  Patient presents with  . Follow-up    Pt states breathing has been worse since for the past 2 days since he ran out of symbicort. Did not bring his formulary list from ins.   confused with instructions on how to use meds, esp saba, but now limited by hip >> sob so w/c bound and still smoking  >>rec Continue your present medications but work on hard on not smoking - this is the most important aspect of your care! Ok to use the 02 with activity, not needed at rest  Only use your albuterol (proair) as a rescue medication   09/14/2013 Follow up  Returns for follow up and med review  We reviewed all his medications and organized them into a medication calendar with  patient education. It appears that he is taking his medications correctly. It is hard for him to afford all of his co-pays at times. We discussed possible options w/ his formulary.-at this time cont on current regimen . If becomes too expensive will look at neb in place of maintenance meds .  rec Continue on current regimen  Work on not smoking  Follow med calendar closely and bring to each visit.    11/29/2014 f/u ov/Wert re: GOLD III copd/ quit smoking 11/24/14/ symb/spiriva/ min saba need/ no med calendar  Chief Complaint  Patient presents with  . Follow-up    Breathing about the same since the last visit. He denies any new co's today.     Min  Am cough  congestion / limited L hip > breathing requesting POC  No obvious day to day or daytime variabilty or assoc   cp or chest tightness, subjective wheeze overt sinus or hb symptoms. No unusual exp hx or h/o childhood pna/ asthma or knowledge of premature birth.  Sleeping ok without nocturnal  or early am exacerbation  of respiratory  c/o's or need for noct saba. Also denies any obvious fluctuation of symptoms with weather or environmental changes or other aggravating or alleviating factors except as outlined above   Current Medications, Allergies, Complete Past Medical History, Past Surgical History, Family  History, and Social History were reviewed in [employer] record.     ROS  The following are not active complaints unless bolded sore throat, dysphagia, dental problems, itching, sneezing,  nasal congestion or excess/ purulent secretions, ear ache,   fever, chills, sweats, unintended wt loss, pleuritic or exertional cp, hemoptysis,  orthopnea pnd or leg swelling, presyncope, palpitations, heartburn, abdominal pain, anorexia, nausea, vomiting, diarrhea  or change in bowel or urinary habits, change in stools or urine, dysuria,hematuria,  rash, arthralgias, visual complaints, headache, numbness weakness or ataxia or  problems with walking due to L Hip or coordination,  change in mood/affect or memory.           Objective:   Physical Exam  W/c bm nad   06/12/2013   200 >  08/30/2013  189 >186 09/14/2013 > 10/30/2013  184> 01/28/2014  184 > 03/06/14  194 > 06/26/2014  200> 11/29/2014  200   HEENT mild turbinate edema.  Oropharynx no thrush or excess pnd or cobblestoning.  No JVD or cervical adenopathy. Mild accessory muscle hypertrophy. Trachea midline, nl thryroid. Chest was hyperinflated by percussion with diminished breath sounds and moderate increased exp time with mid exp wheeze. Hoover sign positive at mid inspiration. Regular rate and rhythm without murmur gallop or rub or increase P2 or edema.  Abd: no hsm, nl excursion. Ext warm without cyanosis or clubbing.    CXR  01/28/2014 : 1. No acute cardiopulmonary disease. 2. COPD.      Assessment & Plan:

## 2014-11-29 NOTE — Patient Instructions (Signed)
Please see patient coordinator before you leave today  to schedule evaluation for ambulatory 02 concentrator  Please schedule a follow up visit in 6  months but call sooner if needed

## 2014-11-30 ENCOUNTER — Encounter: Payer: Self-pay | Admitting: Internal Medicine

## 2014-11-30 NOTE — Assessment & Plan Note (Signed)
-   PFT's 06/12/13  FEV1  1.15 (37%) with ratrio 43 and 14% better p B2 p no rx day of study and DLCO 29% -09/14/2013 med calendar > did not bring to office as requested 10/30/13 or 01/28/14 or 06/27/14  - 01/28/2014 p extensive coaching HFA effectiveness =    75%, 100% with dpi   Despite severe copd should do much better if can maintain on meds and off cigs  I had an extended discussion with the patient/ wife reviewing all relevant studies completed to date and  lasting 15 to 20 minutes of a 25 minute visit    Each maintenance medication was reviewed in detail including most importantly the difference between maintenance and prns and under what circumstances the prns are to be triggered using an action plan format that is not reflected in the computer generated alphabetically organized AVS.    Please see instructions for details which were reviewed in writing and the patient given a copy highlighting the part that I personally wrote and discussed at today's ov.

## 2014-11-30 NOTE — Assessment & Plan Note (Signed)
Placed on 02 at discharge p THR 03/2013 3h/day and cpap at hs RA sats at rest 94% 06/27/14  so rec use 02 with activity only  - 11/29/2014   Walked RA x one lap @ 185 stopped due to hip pain/ sob with sat 88% > referred for POC   Should easily reach adequate sats with POC esp since pace is so slow related to hip.

## 2014-12-05 ENCOUNTER — Other Ambulatory Visit (HOSPITAL_COMMUNITY): Payer: Self-pay | Admitting: Psychiatry

## 2014-12-05 NOTE — Telephone Encounter (Signed)
Refill of Remeron declined at this time as a new 90 day order was e-scribed on 10/23/14.

## 2014-12-13 DIAGNOSIS — G4733 Obstructive sleep apnea (adult) (pediatric): Secondary | ICD-10-CM | POA: Diagnosis not present

## 2014-12-13 DIAGNOSIS — G4730 Sleep apnea, unspecified: Secondary | ICD-10-CM | POA: Diagnosis not present

## 2014-12-16 ENCOUNTER — Telehealth: Payer: Self-pay | Admitting: Internal Medicine

## 2014-12-16 ENCOUNTER — Ambulatory Visit (HOSPITAL_COMMUNITY): Payer: Self-pay | Admitting: Psychiatry

## 2014-12-16 NOTE — Telephone Encounter (Signed)
noted 

## 2014-12-24 DIAGNOSIS — J449 Chronic obstructive pulmonary disease, unspecified: Secondary | ICD-10-CM | POA: Diagnosis not present

## 2015-01-07 ENCOUNTER — Ambulatory Visit (HOSPITAL_COMMUNITY): Payer: Self-pay | Admitting: Psychiatry

## 2015-01-09 ENCOUNTER — Other Ambulatory Visit (HOSPITAL_COMMUNITY): Payer: Self-pay | Admitting: Psychiatry

## 2015-01-10 ENCOUNTER — Other Ambulatory Visit (HOSPITAL_COMMUNITY): Payer: Self-pay | Admitting: Psychiatry

## 2015-01-13 ENCOUNTER — Ambulatory Visit (INDEPENDENT_AMBULATORY_CARE_PROVIDER_SITE_OTHER): Payer: 59 | Admitting: Psychiatry

## 2015-01-13 ENCOUNTER — Encounter (HOSPITAL_COMMUNITY): Payer: Self-pay | Admitting: Psychiatry

## 2015-01-13 VITALS — BP 121/93 | HR 103 | Ht 69.0 in | Wt 195.0 lb

## 2015-01-13 DIAGNOSIS — F1921 Other psychoactive substance dependence, in remission: Secondary | ICD-10-CM

## 2015-01-13 DIAGNOSIS — G4733 Obstructive sleep apnea (adult) (pediatric): Secondary | ICD-10-CM | POA: Diagnosis not present

## 2015-01-13 DIAGNOSIS — G4730 Sleep apnea, unspecified: Secondary | ICD-10-CM | POA: Diagnosis not present

## 2015-01-13 DIAGNOSIS — F329 Major depressive disorder, single episode, unspecified: Secondary | ICD-10-CM

## 2015-01-13 DIAGNOSIS — F330 Major depressive disorder, recurrent, mild: Secondary | ICD-10-CM

## 2015-01-13 MED ORDER — TRAZODONE HCL 50 MG PO TABS: ORAL_TABLET | ORAL | Status: DC

## 2015-01-13 MED ORDER — MIRTAZAPINE 30 MG PO TABS: ORAL_TABLET | ORAL | Status: DC

## 2015-01-13 NOTE — Progress Notes (Signed)
[name] Progress Note  [name] [id number] 64 y.o.  01/13/2015 3:32 PM  Chief Complaint:  Medication management and followup.      History of Present Illness:  [name] came for his followup appointment with his wife.  He missed last appointment because he was sick.  He stopped smoking in July and he had noticed he has difficulty sleeping.  He gets some time irritable with his wife because lack of sleep and feeling tired.  He is taking Remeron without any side effects.  He denies any crying spells, feeling of hopelessness or worthlessness.  He is requesting some medication to help his sleep .  He is using CPAP machine .  Patient lives with his wife was very supportive.  Patient denies any paranoia or any hallucination.  He is no longer smoking marijuana .  He denies any hallucination or any paranoia.  His appetite is okay.  He has gained weight since he stopped smoking.  His vitals are stable.    Suicidal Ideation: No Plan Formed: No Patient has means to carry out plan: No  Homicidal Ideation: No Plan Formed: No Patient has means to carry out plan: No  Review of Systems: Psychiatric: Agitation: No Hallucination: No Depressed Mood: No Insomnia: No Hypersomnia: No Altered Concentration: No Feels Worthless: No Grandiose Ideas: No Belief In Special Powers: No New/Increased Substance Abuse: No Compulsions: No  Neurologic: Headache: Yes Seizure: No Paresthesias: No  Medical history Patient has history of hypertension, sleep apnea, BPH , COPD, diabetes, hyperlipidemia, coronary artery disease status post stent and arthritis. His primary care physician is [name].  Outpatient Encounter Prescriptions as of 01/13/2015  Medication Sig  . albuterol (PROAIR HFA) 108 (90 BASE) MCG/ACT inhaler 2 puffs every 4 hours as needed only  if your can't catch your breath  . amLODipine (NORVASC) 5 MG tablet Take 5 mg by mouth at bedtime.   Marland Kitchen aspirin EC 81 MG tablet  Take 81 mg by mouth at bedtime.  Marland Kitchen atenolol (TENORMIN) 25 MG tablet Take 25 mg by mouth at bedtime.   . budesonide-formoterol (SYMBICORT) 160-4.5 MCG/ACT inhaler Inhale 2 puffs into the lungs 2 (two) times daily.  . clopidogrel (PLAVIX) 75 MG tablet Take 75 mg by mouth at bedtime.   . Coenzyme Q10 (COQ10) 100 MG CAPS Take 1 capsule by mouth every morning.   . fish oil-omega-3 fatty acids 1000 MG capsule Take 1 g by mouth every morning.   . metFORMIN (GLUCOPHAGE) 500 MG tablet 2 tabs by mouth once daily at bedtime  . mirtazapine (REMERON) 30 MG tablet TAKE ONE TABLET BY MOUTH AT BEDTIME  . Multiple Vitamin (MULTIVITAMIN WITH MINERALS) TABS Take 1 tablet by mouth every morning.   . nitroGLYCERIN (NITROSTAT) 0.4 MG SL tablet Place 0.4 mg under the tongue every 5 (five) minutes as needed for chest pain.  . pravastatin (PRAVACHOL) 80 MG tablet Take 80 mg by mouth at bedtime.  . sodium chloride (OCEAN) 0.65 % SOLN nasal spray Place 1 spray into both nostrils as needed for congestion.  . tadalafil (CIALIS) 20 MG tablet Take 20 mg by mouth daily as needed. For erectile dysfunction  . tiotropium (SPIRIVA) 18 MCG inhalation capsule Place 1 capsule (18 mcg total) into inhaler and inhale every morning.  . traZODone (DESYREL) 50 MG tablet Take 1/2 to 1 tab at bed time  . vitamin B-12 (CYANOCOBALAMIN) 1000 MCG tablet Take 1,000 mcg by mouth every morning.  . [DISCONTINUED] dextromethorphan-guaiFENesin [facility] DM)  30-600 MG per 12 hr tablet Take 1 tablet by mouth every 12 (twelve) hours as needed for cough.  . [DISCONTINUED] mirtazapine (REMERON) 30 MG tablet TAKE ONE TABLET BY MOUTH AT BEDTIME  . [DISCONTINUED] Testosterone (ANDROGEL PUMP) 12.5 MG/ACT (1%) GEL Apply 1 pump to each shoulder once daily  . [DISCONTINUED] traZODone (DESYREL) 50 MG tablet Take 1/2 to 1 tab at bed time   No facility-administered encounter medications on file as of 01/13/2015.    Past Psychiatric  History/Hospitalization(s): Patient has history of depression since 72.  He has one suicidal attempt .  In the past he had tried Zoloft Paxil Prozac and Wellbutrin but he stopped due to side effects.  Patient has significant history of using cocaine marijuana and alcohol for 20 years.  He claims to be sober since she's been coming to this office.   Anxiety: Yes Bipolar Disorder: No Depression: No Mania: No Psychosis: No Schizophrenia: No Personality Disorder: No Hospitalization for psychiatric illness: Yes History of Electroconvulsive Shock Therapy: No Prior Suicide Attempts: Yes  Physical Exam: Constitutional:  BP 121/93 mmHg  Pulse 103  Ht 5\' 9"  (1.753 m)  Wt 195 lb (88.451 kg)  BMI 28.78 kg/m2  General Appearance: Patient appears tired, shortness of breath and difficulty walking.    Musculoskeletal: Strength & Muscle Tone: within normal limits Gait & Station: unsteady due to pain  Patient leans: Front ROS   Psychiatric: Speech (describe rate, volume, coherence, spontaneity, and abnormalities if any): Soft clear and coherent with normal tone volume  Thought Process (describe rate, content, abstract reasoning, and computation): Logical and goal-directed  Associations: Coherent, Relevant and Intact  Thoughts: normal  Mental Status: Orientation: oriented to person, place, time/date and situation Mood & Affect: anxiety Attention Span & Concentration: Fair  Established Problem, Stable/Improving (1), New problem, with additional work up planned, Review of Psycho-Social Stressors (1), Review of Last Therapy Session (1), Review of Medication Regimen & Side Effects (2) and Review of New Medication or Change in Dosage (2)  Assessment: Axis I: Maj. depressive disorder, polysubstance dependence in complete remission  Axis II: Deferred  Axis III: See medical history  Plan:  I will add low-dose trazodone to help his sleep .  Continue Remeron 30 mg at bedtime.  He has no  side effects.  Trazodone 50 mg half to one tablet at bedtime for insomnia.  Recommended to call us back if he has any question or any concern.  Discussed medication side effects.  Patient uses [facility] for 90 days.  However we will provide 30 day supply of trazodone to see if it works.  Recommended to call us back if he needed more refills.  I recommend to call us back if he has any question or concerns. I will see him again in 3 months.  Derald Lorge T., MD 01/13/2015

## 2015-01-24 DIAGNOSIS — J449 Chronic obstructive pulmonary disease, unspecified: Secondary | ICD-10-CM | POA: Diagnosis not present

## 2015-01-29 DIAGNOSIS — E119 Type 2 diabetes mellitus without complications: Secondary | ICD-10-CM | POA: Diagnosis not present

## 2015-01-29 DIAGNOSIS — I10 Essential (primary) hypertension: Secondary | ICD-10-CM | POA: Diagnosis not present

## 2015-01-29 DIAGNOSIS — J449 Chronic obstructive pulmonary disease, unspecified: Secondary | ICD-10-CM | POA: Diagnosis not present

## 2015-01-29 DIAGNOSIS — M130 Polyarthritis, unspecified: Secondary | ICD-10-CM | POA: Diagnosis not present

## 2015-02-04 ENCOUNTER — Telehealth: Payer: Self-pay | Admitting: Internal Medicine

## 2015-02-04 NOTE — Telephone Encounter (Signed)
Dose is 2.5 and shoud take the rspimat 2 puffs q am

## 2015-02-04 NOTE — Telephone Encounter (Signed)
Pt states Dr Criss Rosales was giving him samples of Spiriva Respimat 1.6mcg; states he had been using this at 1 puff daily instead of 2 puffs and figured out today that he needed to take 2 puffs. Pt needs Rx sent to [facility] 90 day supply-we have Spiriva handihaler on his current med list; MW please advise if okay to send Rx as Respimat and which strength he needs to be on 1.25 or 2.5. Thanks.   Pt aware of call back tomorrow.

## 2015-02-05 MED ORDER — TIOTROPIUM BROMIDE MONOHYDRATE 2.5 MCG/ACT IN AERS: 2.0000 | INHALATION_SPRAY | Freq: Every morning | RESPIRATORY_TRACT | Status: DC

## 2015-02-05 NOTE — Telephone Encounter (Signed)
Called pt and is aware of change. Nothing further needed and RX sent

## 2015-02-12 ENCOUNTER — Other Ambulatory Visit (HOSPITAL_COMMUNITY): Payer: Self-pay | Admitting: Psychiatry

## 2015-02-12 DIAGNOSIS — G4733 Obstructive sleep apnea (adult) (pediatric): Secondary | ICD-10-CM | POA: Diagnosis not present

## 2015-02-12 DIAGNOSIS — G4730 Sleep apnea, unspecified: Secondary | ICD-10-CM | POA: Diagnosis not present

## 2015-02-13 ENCOUNTER — Other Ambulatory Visit (HOSPITAL_COMMUNITY): Payer: Self-pay | Admitting: Psychiatry

## 2015-02-13 DIAGNOSIS — F330 Major depressive disorder, recurrent, mild: Secondary | ICD-10-CM

## 2015-02-13 MED ORDER — TRAZODONE HCL 50 MG PO TABS: ORAL_TABLET | ORAL | Status: DC

## 2015-02-23 DIAGNOSIS — J449 Chronic obstructive pulmonary disease, unspecified: Secondary | ICD-10-CM | POA: Diagnosis not present

## 2015-03-14 DIAGNOSIS — G4730 Sleep apnea, unspecified: Secondary | ICD-10-CM | POA: Diagnosis not present

## 2015-03-14 DIAGNOSIS — G4733 Obstructive sleep apnea (adult) (pediatric): Secondary | ICD-10-CM | POA: Diagnosis not present

## 2015-03-26 DIAGNOSIS — J449 Chronic obstructive pulmonary disease, unspecified: Secondary | ICD-10-CM | POA: Diagnosis not present

## 2015-04-14 DIAGNOSIS — G4730 Sleep apnea, unspecified: Secondary | ICD-10-CM | POA: Diagnosis not present

## 2015-04-14 DIAGNOSIS — G4733 Obstructive sleep apnea (adult) (pediatric): Secondary | ICD-10-CM | POA: Diagnosis not present

## 2015-04-15 ENCOUNTER — Ambulatory Visit (HOSPITAL_COMMUNITY): Payer: Self-pay | Admitting: Psychiatry

## 2015-04-18 DIAGNOSIS — R0600 Dyspnea, unspecified: Secondary | ICD-10-CM | POA: Diagnosis not present

## 2015-04-18 DIAGNOSIS — I252 Old myocardial infarction: Secondary | ICD-10-CM | POA: Diagnosis not present

## 2015-04-18 DIAGNOSIS — E784 Other hyperlipidemia: Secondary | ICD-10-CM | POA: Diagnosis not present

## 2015-04-18 DIAGNOSIS — I2510 Atherosclerotic heart disease of native coronary artery without angina pectoris: Secondary | ICD-10-CM | POA: Diagnosis not present

## 2015-04-21 DIAGNOSIS — S335XXA Sprain of ligaments of lumbar spine, initial encounter: Secondary | ICD-10-CM | POA: Diagnosis not present

## 2015-04-21 DIAGNOSIS — S338XXA Sprain of other parts of lumbar spine and pelvis, initial encounter: Secondary | ICD-10-CM | POA: Diagnosis not present

## 2015-04-25 DIAGNOSIS — J449 Chronic obstructive pulmonary disease, unspecified: Secondary | ICD-10-CM | POA: Diagnosis not present

## 2015-04-29 ENCOUNTER — Other Ambulatory Visit (HOSPITAL_COMMUNITY): Payer: Self-pay | Admitting: Psychiatry

## 2015-04-30 ENCOUNTER — Telehealth (HOSPITAL_COMMUNITY): Payer: Self-pay

## 2015-04-30 ENCOUNTER — Ambulatory Visit (HOSPITAL_COMMUNITY): Payer: Self-pay | Admitting: Psychiatry

## 2015-04-30 ENCOUNTER — Telehealth: Payer: Self-pay | Admitting: Internal Medicine

## 2015-04-30 DIAGNOSIS — F330 Major depressive disorder, recurrent, mild: Secondary | ICD-10-CM

## 2015-04-30 NOTE — Telephone Encounter (Signed)
We'll discuss with the patient when he returns as there is a long wait anyway

## 2015-04-30 NOTE — Telephone Encounter (Signed)
Spoke with pt. States that Dr. Wynonia Lawman wants [place] to refer him to pulmonary rehab. Pt was last seen in 11/2014 and has pending OV with MW in 05/2015.  MW - please advise about referral. Thanks.

## 2015-04-30 NOTE — Telephone Encounter (Signed)
Medication management - Patient left a message he was in need of a new Remeron order.  Stated had to cancel appointment from today, 04/30/15 due to not feeling well and is rescheduled for 06/03/15.  Pt. no showed 04/15/15 and last seen 01/13/15.

## 2015-04-30 NOTE — Telephone Encounter (Signed)
Spoke with pt and is aware of below. He had no questions. Nothing further needed

## 2015-05-02 MED ORDER — MIRTAZAPINE 30 MG PO TABS: ORAL_TABLET | ORAL | Status: DC

## 2015-05-02 NOTE — Telephone Encounter (Signed)
Met with Dr. Adele Schilder who approved a one time 30 day order for patient's prescribed Remeron but no more refills until patient is evaluated.  Called patient to inform as he stated plan to keep appointment on 06/03/15.  One time new order e-scribed to [facility] as patient stated understanding and approved by Dr. Adele Schilder.

## 2015-05-09 ENCOUNTER — Other Ambulatory Visit (HOSPITAL_COMMUNITY): Payer: Self-pay | Admitting: Psychiatry

## 2015-05-13 ENCOUNTER — Other Ambulatory Visit: Payer: Self-pay | Admitting: Internal Medicine

## 2015-05-13 MED ORDER — TIOTROPIUM BROMIDE MONOHYDRATE 2.5 MCG/ACT IN AERS: 2.0000 | INHALATION_SPRAY | Freq: Every morning | RESPIRATORY_TRACT | Status: DC

## 2015-05-14 ENCOUNTER — Telehealth: Payer: Self-pay | Admitting: Internal Medicine

## 2015-05-14 NOTE — Telephone Encounter (Signed)
Called and spoke with the patient. Patient states that he received a 90 days supply of Spiriva HandiHaler instead of Spiriva respimat. He states that PCP sent rx for Spiriva HandiHaler. Our office sent a rx for Spiriva respimat on 05/13/15 to the pharmacy. I explained to the patient that we had no samples to give him but I did speak with Joellen Jersey and she verified that the HandiHaler and respimat were same strength and that he could use that. Patient states he will use 90 day supply of HandiHaler and will use respimat once supply of HandiHaler is gone. Pt voiced understanding and had no further questions. Nothing further needed.

## 2015-05-15 DIAGNOSIS — G4730 Sleep apnea, unspecified: Secondary | ICD-10-CM | POA: Diagnosis not present

## 2015-05-15 DIAGNOSIS — G4733 Obstructive sleep apnea (adult) (pediatric): Secondary | ICD-10-CM | POA: Diagnosis not present

## 2015-05-26 DIAGNOSIS — J449 Chronic obstructive pulmonary disease, unspecified: Secondary | ICD-10-CM | POA: Diagnosis not present

## 2015-05-28 ENCOUNTER — Encounter: Payer: Self-pay | Admitting: Internal Medicine

## 2015-05-28 ENCOUNTER — Ambulatory Visit (INDEPENDENT_AMBULATORY_CARE_PROVIDER_SITE_OTHER)
Admission: RE | Admit: 2015-05-28 | Discharge: 2015-05-28 | Disposition: A | Discharge status: Home / Self Care | Payer: 59 | Source: Ambulatory Visit | Attending: Internal Medicine | Admitting: Internal Medicine

## 2015-05-28 ENCOUNTER — Ambulatory Visit (INDEPENDENT_AMBULATORY_CARE_PROVIDER_SITE_OTHER): Payer: Medicare Other | Admitting: Internal Medicine

## 2015-05-28 DIAGNOSIS — J9611 Chronic respiratory failure with hypoxia: Secondary | ICD-10-CM

## 2015-05-28 DIAGNOSIS — J449 Chronic obstructive pulmonary disease, unspecified: Secondary | ICD-10-CM

## 2015-05-28 DIAGNOSIS — R0602 Shortness of breath: Secondary | ICD-10-CM | POA: Diagnosis not present

## 2015-05-28 NOTE — Progress Notes (Signed)
Subjective:    Patient ID: [name], male    DOB: 22-May-1950  MRN: [id number]    Brief patient profile:  40 yobm quit smoking 12/03/14 on disability due to depression since 1994 baseline on spiriva with  doe room to room s 02 then underwent L THR at [facility] and noted even in hosp need 02 with any activity so referred to pulmonary clinic 05/01/2013 by [name] with GOLD III copd with mild reversibility documented 06/12/13    History of Present Illness  05/01/2013 1st [name] Pulmonary office visit/ [name] cc doe x room to room if not on 02 using walker ever since mobilized from surgery 04/03/13 s cough, some chest tightness once or twice per week esp after cigarette use, has not tried inhalers at home (other than spiriva)  but nebs in hosp seemed to help rec Start symbicort 160 Take 2 puffs first thing in am and then another 2 puffs about 12 hours later.  Continue spiriva one in am Work on inhaler technique:    Please remember to go to the lab and x-ray department downstairs for your tests - we will call you with the results when they are available. The key is to stop smoking completely before smoking completely stops you!   08/30/2013 f/u ov/[name] re: GOLD III copd with reversibilty on spiriva and symbicort 160 2bid Chief Complaint  Patient presents with  . Follow-up    Pt states breathing has been worse since for the past 2 days since he ran out of symbicort. Did not bring his formulary list from ins.   confused with instructions on how to use meds, esp saba, but now limited by hip >> sob so w/c bound and still smoking  >>rec Continue your present medications but work on hard on not smoking - this is the most important aspect of your care! Ok to use the 02 with activity, not needed at rest  Only use your albuterol (proair) as a rescue medication   09/14/2013 Follow up  Returns for follow up and med review  We reviewed all his medications and organized them into a medication calendar with  patient education. It appears that he is taking his medications correctly. It is hard for him to afford all of his co-pays at times. We discussed possible options w/ his formulary.-at this time cont on current regimen . If becomes too expensive will look at neb in place of maintenance meds .  rec Continue on current regimen  Work on not smoking  Follow med calendar closely and bring to each visit.    11/29/2014 f/u ov/[name] re: GOLD III copd/ quit smoking 11/24/14/ symb/spiriva/ min saba need/ no med calendar  Chief Complaint  Patient presents with  . Follow-up    Breathing about the same since the last visit. He denies any new co's today.   Min  Am cough  congestion / limited L hip > breathing requesting POC rec Please see patient coordinator before you leave today  to schedule evaluation for ambulatory 02 concentrator    05/28/2015  f/u ov/[name] re: symbicort / spiriva maint rx / no med calendar  Chief Complaint  Patient presents with  . Follow-up    Pt states his breathing is unchanged. He rarely uses albuterol inhaler.   sleeps on cpap/ just using 02 with activity  Dispute between wife and pt re sob severity / he is becoming more and more sedentary ? hiip vs sob  No cough /congestion  No obvious day to day or daytime variabilty or assoc   cp or chest tightness, subjective wheeze overt sinus or hb symptoms. No unusual exp hx or h/o childhood pna/ asthma or knowledge of premature birth.  Sleeping ok without nocturnal  or early am exacerbation  of respiratory  c/o's or need for noct saba. Also denies any obvious fluctuation of symptoms with weather or environmental changes or other aggravating or alleviating factors except as outlined above   Current Medications, Allergies, Complete Past Medical History, Past Surgical History, Family History, and Social History were reviewed in [employer] record.     ROS  The following are not active complaints unless  bolded sore throat, dysphagia, dental problems, itching, sneezing,  nasal congestion or excess/ purulent secretions, ear ache,   fever, chills, sweats, unintended wt loss, pleuritic or exertional cp, hemoptysis,  orthopnea pnd or leg swelling, presyncope, palpitations, heartburn, abdominal pain, anorexia, nausea, vomiting, diarrhea  or change in bowel or urinary habits, change in stools or urine, dysuria,hematuria,  rash, arthralgias, visual complaints, headache, numbness weakness or ataxia or problems with walking due to L Hip or coordination,  change in mood/affect or memory.           Objective:   Physical Exam  W/c bm nad / vital signs reviewed   06/12/2013   200 >  08/30/2013  189 >186 09/14/2013 > 10/30/2013  184> 01/28/2014  184 > 03/06/14  194 > 06/26/2014  200> 11/29/2014  200> 05/28/2015  212    HEENT mild turbinate edema.  Oropharynx no thrush or excess pnd or cobblestoning.  No JVD or cervical adenopathy. Mild accessory muscle hypertrophy. Trachea midline, nl thryroid. Chest was hyperinflated by percussion with diminished breath sounds and moderate increased exp time with mid exp wheeze. Hoover sign positive at mid inspiration. Regular rate and rhythm without murmur gallop or rub or increase P2 or edema.  Abd: no hsm, nl excursion. Ext warm without cyanosis or clubbing.    CXR PA and Lateral:   05/28/2015 :    I personally reviewed images and agree with radiology impression as follows:   COPD with increased reticular nodular opacity in both mid to lower lung zones My impression:  Air trapping compressing markings in lower lobes       Assessment & Plan:

## 2015-05-28 NOTE — Patient Instructions (Signed)
No change recs - try to be more active but always wear the 3lpm with exertion   Please schedule a follow up visit in 3 months but call sooner if needed with pfts and we'll discuss rehab then

## 2015-05-30 NOTE — Progress Notes (Signed)
Quick Note:  Spoke with pt and notified of results per Dr. Wert. Pt verbalized understanding and denied any questions.  ______ 

## 2015-05-30 NOTE — Assessment & Plan Note (Addendum)
-   PFT's 06/12/13  FEV1  1.15 (37%) with ratio 43 and 14% better p B2 p no rx day of study and DLCO 29% - 09/14/2013 med calendar > did not bring to office as requested 10/30/13 or 01/28/14 or 06/27/14 or 05/28/2015  - 01/28/2014 p extensive coaching HFA effectiveness =    75%, 100% with dpi    DDX of  difficult airways management almost all start with A and  include Adherence, Ace Inhibitors, Acid Reflux, Active Sinus Disease, Alpha 1 Antitripsin deficiency, Anxiety masquerading as Airways dz,  ABPA,  Allergy(esp in young), Aspiration (esp in elderly), Adverse effects of meds,  Active smokers, A bunch of PE's (a small clot burden can't cause this syndrome unless there is already severe underlying pulm or vascular dz with poor reserve) plus two Bs  = Bronchiectasis and Beta blocker use..and one C= CHF  Adherence is always the initial "prime suspect" and is a multilayered concern that requires a "trust but verify" approach in every patient - starting with knowing how to use medications, especially inhalers, correctly, keeping up with refills and understanding the fundamental difference between maintenance and prns vs those medications only taken for a very short course and then stopped and not refilled.  - no med calendar / requested he bring all meds and med calendar next ov  ? Anxiety > usually at the bottom of this list of usual suspects but should be much higher on this pt's based on H and P  - I believe his anxiety is resulting in more air trapping   ? Active smoking > denies and likely truthful as much more anxious since quit smoking   I had an extended discussion with the patient reviewing all relevant studies completed to date and  lasting 15 to 20 minutes of a 25 minute visit    Each maintenance medication was reviewed in detail including most importantly the difference between maintenance and prns and under what circumstances the prns are to be triggered using an action plan format that is not  reflected in the computer generated alphabetically organized AVS but trather by a customized med calendar that reflects the AVS meds with confirmed 100% correlation.   Please see instructions for details which were reviewed in writing and the patient given a copy highlighting the part that I personally wrote and discussed at today's ov.

## 2015-05-30 NOTE — Assessment & Plan Note (Signed)
Placed on 02 at discharge p THR 03/2013 3h/day and cpap at hs RA sats at rest 94% 06/27/14  so rec use 02 with activity only  - 11/29/2014   Walked RA x one lap @ 185 stopped due to hip pain/ sob with sat 88% > referred for POC  - 05/28/2015 sats ok at rest RA/  Walked on 3lpm  x one lap @ 185 stopped due to sob/ chest tightness but sats ok   As of 05/28/2015 rec 3lpm with any activity/ and hs/ none at rest unless sob at rest

## 2015-06-03 ENCOUNTER — Ambulatory Visit (INDEPENDENT_AMBULATORY_CARE_PROVIDER_SITE_OTHER): Payer: 59 | Admitting: Psychiatry

## 2015-06-03 ENCOUNTER — Encounter (HOSPITAL_COMMUNITY): Payer: Self-pay | Admitting: Psychiatry

## 2015-06-03 VITALS — BP 155/96 | HR 108 | Ht 70.0 in | Wt 211.6 lb

## 2015-06-03 DIAGNOSIS — F330 Major depressive disorder, recurrent, mild: Secondary | ICD-10-CM

## 2015-06-03 MED ORDER — MIRTAZAPINE 30 MG PO TABS: ORAL_TABLET | ORAL | Status: DC

## 2015-06-04 NOTE — Progress Notes (Signed)
[facility] Progress Note  [name] [id number] 65 y.o.  06/04/2015 9:52 AM  Chief Complaint:  Medication management and followup.      History of Present Illness:  [name] came for his followup appointment with his wife.  He missed appointment in November because he was sick.  He continues to have chronic health issues and has been recently seen his pulmonologist.  He is concerned about his breathing issues which is not getting better.  However his anxiety and depression is under control with Remeron.  On his last visit we added trazodone as needed for insomnia and he had a good response but he does not take it every night.  He denies any irritability , anger , mood swing.  He denies any recent feeling of hopelessness or worthlessness.  His energy level is low.  He denies any crying spells .  He is using CPAP machine but there are times that he has difficulty sleeping due to cough and breathing issues.  Denies any hallucination or any paranoia.  He denies drinking or using any illegal substances.  His Christmas was good as he was able to see his family members.  His appetite is okay.  His vitals are stable.  Suicidal Ideation: No Plan Formed: No Patient has means to carry out plan: No  Homicidal Ideation: No Plan Formed: No Patient has means to carry out plan: No  Review of Systems: Psychiatric: Agitation: No Hallucination: No Depressed Mood: No Insomnia: No Hypersomnia: No Altered Concentration: No Feels Worthless: No Grandiose Ideas: No Belief In Special Powers: No New/Increased Substance Abuse: No Compulsions: No  Neurologic: Headache: Yes Seizure: No Paresthesias: No  Medical history Patient has history of hypertension, sleep apnea, BPH , COPD, diabetes, hyperlipidemia, coronary artery disease status post stent and arthritis. His primary care physician is [name].  Outpatient Encounter Prescriptions as of 06/03/2015  Medication Sig  . albuterol  (PROAIR HFA) 108 (90 BASE) MCG/ACT inhaler 2 puffs every 4 hours as needed only  if your can't catch your breath  . amLODipine (NORVASC) 5 MG tablet Take 5 mg by mouth at bedtime.   Marland Kitchen aspirin EC 81 MG tablet Take 81 mg by mouth at bedtime.  Marland Kitchen atenolol (TENORMIN) 25 MG tablet Take 25 mg by mouth at bedtime.   . budesonide-formoterol (SYMBICORT) 160-4.5 MCG/ACT inhaler Inhale 2 puffs into the lungs 2 (two) times daily.  . clopidogrel (PLAVIX) 75 MG tablet Take 75 mg by mouth at bedtime.   . Coenzyme Q10 (COQ10) 100 MG CAPS Take 1 capsule by mouth every morning.   . metFORMIN (GLUCOPHAGE) 500 MG tablet 2 tabs by mouth once daily at bedtime  . mirtazapine (REMERON) 30 MG tablet TAKE ONE TABLET BY MOUTH AT BEDTIME  . Multiple Vitamin (MULTIVITAMIN WITH MINERALS) TABS Take 1 tablet by mouth every morning.   . nitroGLYCERIN (NITROSTAT) 0.4 MG SL tablet Place 0.4 mg under the tongue every 5 (five) minutes as needed for chest pain.  . OXYGEN Inhale 2-3 L into the lungs. With exertion only  . pravastatin (PRAVACHOL) 80 MG tablet Take 80 mg by mouth at bedtime.  . sodium chloride (OCEAN) 0.65 % SOLN nasal spray Place 1 spray into both nostrils as needed for congestion.  . tadalafil (CIALIS) 20 MG tablet Take 20 mg by mouth daily as needed. For erectile dysfunction  . Tiotropium Bromide Monohydrate (SPIRIVA RESPIMAT) 2.5 MCG/ACT AERS Inhale 2 puffs into the lungs every morning.  . traZODone (DESYREL) 50  MG tablet Take 1/2 to 1 tab at bed time  . vitamin B-12 (CYANOCOBALAMIN) 1000 MCG tablet Take 1,000 mcg by mouth every morning.  . [DISCONTINUED] mirtazapine (REMERON) 30 MG tablet TAKE ONE TABLET BY MOUTH AT BEDTIME   No facility-administered encounter medications on file as of 06/03/2015.    Past Psychiatric History/Hospitalization(s): Patient has history of depression since 52.  He has one suicidal attempt .  In the past he had tried Zoloft Paxil Prozac and Wellbutrin but he stopped due to side  effects.  Patient has significant history of using cocaine marijuana and alcohol for 20 years.  He claims to be sober since she's been coming to this office.   Anxiety: Yes Bipolar Disorder: No Depression: No Mania: No Psychosis: No Schizophrenia: No Personality Disorder: No Hospitalization for psychiatric illness: Yes History of Electroconvulsive Shock Therapy: No Prior Suicide Attempts: Yes  Physical Exam: Constitutional:  BP 155/96 mmHg  Pulse 108  Ht 5\' 10"  (1.778 m)  Wt 211 lb 9.6 oz (95.981 kg)  BMI 30.36 kg/m2  General Appearance: Patient appears tired, shortness of breath and difficulty walking.    Musculoskeletal: Strength & Muscle Tone: within normal limits Gait & Station: unsteady due to pain  Patient leans: Front Review of Systems  Constitutional: Positive for malaise/fatigue.  Respiratory: Positive for shortness of breath.   Psychiatric/Behavioral: Negative for suicidal ideas and hallucinations.   Mental status examination: Patient is casually dressed and fairly groomed.  He is using wheelchair for ambulation.  He has a oxygen as he has difficulty breathing.  He appears tired but cooperative.  He described his mood exhausted .  His affect is appropriate.  He denies any auditory or visual hallucination.  He denies any active or passive suicidal parts or homicidal thought.  His attention and concentration is fair.  His thought processes slow but logical and goal-directed.  There were no delusions, paranoia or any obsessive thoughts.  His psychomotor activity is slow.  He is alert and oriented 3 .  His cognition is intact.  His insight judgment and impulse control is okay.  Established Problem, Stable/Improving (1), Review or order clinical lab tests (1), Review of Last Therapy Session (1) and Review of Medication Regimen & Side Effects (2)  Assessment: Axis I: Maj. depressive disorder, polysubstance dependence in complete remission  Axis II: Deferred  Axis III:  See medical history  Plan:  Patient is taking Remeron 30 mg at bedtime.  He uses trazodone 50 mg half tablet only as needed and he does not need a new prescription at this time.  Discussed medication side effects and benefits.  Encouraged to keep appointment with his pulmonologist as he is concerned about his breathing.  Recommended to call us back if he has any question or any concern.   Patient uses [facility] for 90 days an a new position of Remeron is sent to pharmacy.  Discuss safety plan that anytime having active suicidal thoughts or homicidal thought but he need to call 911 or go to a local emergency room.  Follow-up in 3 months.  ARFEEN,SYED T., MD 06/04/2015

## 2015-06-14 DIAGNOSIS — G4733 Obstructive sleep apnea (adult) (pediatric): Secondary | ICD-10-CM | POA: Diagnosis not present

## 2015-06-14 DIAGNOSIS — G4730 Sleep apnea, unspecified: Secondary | ICD-10-CM | POA: Diagnosis not present

## 2015-06-26 DIAGNOSIS — J449 Chronic obstructive pulmonary disease, unspecified: Secondary | ICD-10-CM | POA: Diagnosis not present

## 2015-07-09 ENCOUNTER — Other Ambulatory Visit (HOSPITAL_COMMUNITY): Payer: Self-pay | Admitting: Psychiatry

## 2015-07-09 DIAGNOSIS — F330 Major depressive disorder, recurrent, mild: Secondary | ICD-10-CM

## 2015-07-09 MED ORDER — TRAZODONE HCL 50 MG PO TABS: ORAL_TABLET | ORAL | Status: DC

## 2015-07-14 ENCOUNTER — Telehealth (HOSPITAL_COMMUNITY): Payer: Self-pay

## 2015-07-14 DIAGNOSIS — G4733 Obstructive sleep apnea (adult) (pediatric): Secondary | ICD-10-CM | POA: Diagnosis not present

## 2015-07-14 DIAGNOSIS — G4730 Sleep apnea, unspecified: Secondary | ICD-10-CM | POA: Diagnosis not present

## 2015-07-14 DIAGNOSIS — F330 Major depressive disorder, recurrent, mild: Secondary | ICD-10-CM

## 2015-07-14 NOTE — Telephone Encounter (Signed)
Patient called and states the Remeron is not working and he has had 2 hours of sleep in the last 2 days. Please advise, thank you

## 2015-07-15 MED ORDER — HYDROXYZINE PAMOATE 25 MG PO CAPS: ORAL_CAPSULE | ORAL | Status: DC

## 2015-07-15 NOTE — Telephone Encounter (Signed)
I spoke with Dr. Adele Schilder and he gave me orders to send in Vistaril 25 mg 1-2 po qhs prn. I sent this to the pharmacy and then called the patient to let him know

## 2015-07-15 NOTE — Telephone Encounter (Signed)
After speaking with you about patients Trazodone and increasing to 100 mg at night, I called the patient. Patient states that he stopped taking the trazodone at all because he was unable to sleep, had a stuffy nose and his joints were aching. He states that when he stopped they symptoms went away. Please advise, thank you

## 2015-07-22 ENCOUNTER — Other Ambulatory Visit (HOSPITAL_COMMUNITY): Payer: Self-pay | Admitting: Psychiatry

## 2015-07-23 DIAGNOSIS — H31011 Macula scars of posterior pole (postinflammatory) (post-traumatic), right eye: Secondary | ICD-10-CM | POA: Diagnosis not present

## 2015-07-23 DIAGNOSIS — H31019 Macula scars of posterior pole (postinflammatory) (post-traumatic), unspecified eye: Secondary | ICD-10-CM | POA: Diagnosis not present

## 2015-07-23 DIAGNOSIS — E119 Type 2 diabetes mellitus without complications: Secondary | ICD-10-CM | POA: Diagnosis not present

## 2015-07-23 DIAGNOSIS — H3554 Dystrophies primarily involving the retinal pigment epithelium: Secondary | ICD-10-CM | POA: Diagnosis not present

## 2015-07-23 DIAGNOSIS — H4720 Unspecified optic atrophy: Secondary | ICD-10-CM | POA: Diagnosis not present

## 2015-07-24 DIAGNOSIS — J449 Chronic obstructive pulmonary disease, unspecified: Secondary | ICD-10-CM | POA: Diagnosis not present

## 2015-07-29 ENCOUNTER — Other Ambulatory Visit: Payer: Self-pay | Admitting: Internal Medicine

## 2015-07-29 MED ORDER — BUDESONIDE-FORMOTEROL FUMARATE 160-4.5 MCG/ACT IN AERO: 2.0000 | INHALATION_SPRAY | Freq: Two times a day (BID) | RESPIRATORY_TRACT | Status: DC

## 2015-08-13 DIAGNOSIS — G4733 Obstructive sleep apnea (adult) (pediatric): Secondary | ICD-10-CM | POA: Diagnosis not present

## 2015-08-13 DIAGNOSIS — G4730 Sleep apnea, unspecified: Secondary | ICD-10-CM | POA: Diagnosis not present

## 2015-08-18 DIAGNOSIS — M130 Polyarthritis, unspecified: Secondary | ICD-10-CM | POA: Diagnosis not present

## 2015-08-18 DIAGNOSIS — I519 Heart disease, unspecified: Secondary | ICD-10-CM | POA: Diagnosis not present

## 2015-08-18 DIAGNOSIS — E119 Type 2 diabetes mellitus without complications: Secondary | ICD-10-CM | POA: Diagnosis not present

## 2015-08-18 DIAGNOSIS — I10 Essential (primary) hypertension: Secondary | ICD-10-CM | POA: Diagnosis not present

## 2015-08-18 DIAGNOSIS — J441 Chronic obstructive pulmonary disease with (acute) exacerbation: Secondary | ICD-10-CM | POA: Diagnosis not present

## 2015-08-24 DIAGNOSIS — J449 Chronic obstructive pulmonary disease, unspecified: Secondary | ICD-10-CM | POA: Diagnosis not present

## 2015-08-26 ENCOUNTER — Ambulatory Visit: Payer: Self-pay | Admitting: Internal Medicine

## 2015-09-01 ENCOUNTER — Ambulatory Visit (HOSPITAL_COMMUNITY): Payer: Self-pay | Admitting: Psychiatry

## 2015-09-02 ENCOUNTER — Ambulatory Visit: Payer: Self-pay | Admitting: Internal Medicine

## 2015-09-03 ENCOUNTER — Ambulatory Visit (HOSPITAL_COMMUNITY): Payer: Self-pay | Admitting: Psychiatry

## 2015-09-10 ENCOUNTER — Telehealth (HOSPITAL_COMMUNITY): Payer: Self-pay

## 2015-09-10 ENCOUNTER — Other Ambulatory Visit (HOSPITAL_COMMUNITY): Payer: Self-pay | Admitting: Psychiatry

## 2015-09-10 DIAGNOSIS — F330 Major depressive disorder, recurrent, mild: Secondary | ICD-10-CM

## 2015-09-10 NOTE — Telephone Encounter (Signed)
Patient is calling for a refill on Remeron, he is out and has a follow up on 5/9. Please review and advise, thank you

## 2015-09-11 MED ORDER — MIRTAZAPINE 30 MG PO TABS: ORAL_TABLET | ORAL | Status: DC

## 2015-09-11 NOTE — Telephone Encounter (Signed)
He had missed multiple appointments.  We can provide last time his refills but in the future he need to keep appointment.

## 2015-09-11 NOTE — Telephone Encounter (Signed)
Sent remeron in to the pharmacy, 30 day order

## 2015-09-12 ENCOUNTER — Other Ambulatory Visit (HOSPITAL_COMMUNITY): Payer: Self-pay

## 2015-09-12 DIAGNOSIS — G4733 Obstructive sleep apnea (adult) (pediatric): Secondary | ICD-10-CM | POA: Diagnosis not present

## 2015-09-12 DIAGNOSIS — G4730 Sleep apnea, unspecified: Secondary | ICD-10-CM | POA: Diagnosis not present

## 2015-09-12 DIAGNOSIS — F330 Major depressive disorder, recurrent, mild: Secondary | ICD-10-CM

## 2015-09-12 MED ORDER — HYDROXYZINE PAMOATE 25 MG PO CAPS: ORAL_CAPSULE | ORAL | Status: DC

## 2015-09-12 NOTE — Telephone Encounter (Signed)
Per Dr. Adele Schilder patient can get one month of his medications. I called patient and let him know that he needs to keep his appointment on 5/9 in order to continue to get his prescriptions. Patient voiced his understanding, he also stated that his Vistaril is not working and I advised him to speak with Dr. Adele Schilder about that.

## 2015-09-17 ENCOUNTER — Other Ambulatory Visit (HOSPITAL_COMMUNITY): Payer: Self-pay | Admitting: Psychiatry

## 2015-09-23 ENCOUNTER — Ambulatory Visit (HOSPITAL_COMMUNITY): Payer: Self-pay | Admitting: Psychiatry

## 2015-09-23 DIAGNOSIS — J449 Chronic obstructive pulmonary disease, unspecified: Secondary | ICD-10-CM | POA: Diagnosis not present

## 2015-09-29 ENCOUNTER — Ambulatory Visit (HOSPITAL_COMMUNITY): Payer: Self-pay | Admitting: Psychiatry

## 2015-10-01 ENCOUNTER — Ambulatory Visit (INDEPENDENT_AMBULATORY_CARE_PROVIDER_SITE_OTHER): Payer: 59 | Admitting: Psychiatry

## 2015-10-01 ENCOUNTER — Encounter (HOSPITAL_COMMUNITY): Payer: Self-pay | Admitting: Psychiatry

## 2015-10-01 VITALS — BP 126/90 | HR 89 | Ht 69.0 in | Wt 204.2 lb

## 2015-10-01 DIAGNOSIS — F330 Major depressive disorder, recurrent, mild: Secondary | ICD-10-CM | POA: Diagnosis not present

## 2015-10-01 DIAGNOSIS — F1921 Other psychoactive substance dependence, in remission: Secondary | ICD-10-CM

## 2015-10-01 MED ORDER — HYDROXYZINE PAMOATE 25 MG PO CAPS: 25.0000 mg | ORAL_CAPSULE | Freq: Every day | ORAL | Status: DC

## 2015-10-01 MED ORDER — MIRTAZAPINE 30 MG PO TABS: ORAL_TABLET | ORAL | Status: DC

## 2015-10-01 NOTE — Progress Notes (Signed)
[facility] Progress Note  VERNICE NOTHDURFT [id number] 65 y.o.  10/01/2015 4:01 PM  Chief Complaint:  Medication management and followup.      History of Present Illness:  Zimere came for his followup appointment with his wife.  He is taking Remeron and Vistaril as prescribed.  He does not take trazodone every night.  Overall his sleep is good.  He admitted lately having issues with her life because she is pushing him to do walking and he gets easily short of breath .  He is seeing a pulmonologist and trying to do breathing exercises .  He sleeping good.  He denies any panic attack.  He denies any crying spells, feeling of hopelessness or worthlessness.  He denies any suicidal thoughts.  His anger is under control.  He is not drinking alcohol or using any illegal substances.  He uses CPAP machine at night which is helping his sleep. His appetite is okay.  His vitals are stable.  Suicidal Ideation: No Plan Formed: No Patient has means to carry out plan: No  Homicidal Ideation: No Plan Formed: No Patient has means to carry out plan: No  Review of Systems: Psychiatric: Agitation: No Hallucination: No Depressed Mood: No Insomnia: No Hypersomnia: No Altered Concentration: No Feels Worthless: No Grandiose Ideas: No Belief In Special Powers: No New/Increased Substance Abuse: No Compulsions: No  Neurologic: Headache: Yes Seizure: No Paresthesias: No  Medical history Patient has history of hypertension, sleep apnea, BPH , COPD, diabetes, hyperlipidemia, coronary artery disease status post stent and arthritis. His primary care physician is [name].  Outpatient Encounter Prescriptions as of 10/01/2015  Medication Sig  . albuterol (PROAIR HFA) 108 (90 BASE) MCG/ACT inhaler 2 puffs every 4 hours as needed only  if your can't catch your breath  . amLODipine (NORVASC) 5 MG tablet Take 5 mg by mouth at bedtime.   Marland Kitchen aspirin EC 81 MG tablet Take 81 mg by mouth at  bedtime.  Marland Kitchen atenolol (TENORMIN) 25 MG tablet Take 25 mg by mouth at bedtime.   . budesonide-formoterol (SYMBICORT) 160-4.5 MCG/ACT inhaler Inhale 2 puffs into the lungs 2 (two) times daily.  . clopidogrel (PLAVIX) 75 MG tablet Take 75 mg by mouth at bedtime.   . Coenzyme Q10 (COQ10) 100 MG CAPS Take 1 capsule by mouth every morning.   . hydrOXYzine (VISTARIL) 25 MG capsule Take 1 capsule (25 mg total) by mouth at bedtime.  . metFORMIN (GLUCOPHAGE) 500 MG tablet 2 tabs by mouth once daily at bedtime  . mirtazapine (REMERON) 30 MG tablet TAKE ONE TABLET BY MOUTH AT BEDTIME  . Multiple Vitamin (MULTIVITAMIN WITH MINERALS) TABS Take 1 tablet by mouth every morning.   . nitroGLYCERIN (NITROSTAT) 0.4 MG SL tablet Place 0.4 mg under the tongue every 5 (five) minutes as needed for chest pain.  . OXYGEN Inhale 2-3 L into the lungs. With exertion only  . pravastatin (PRAVACHOL) 40 MG tablet 40 mg.  . sodium chloride (OCEAN) 0.65 % SOLN nasal spray Place 1 spray into both nostrils as needed for congestion.  . tadalafil (CIALIS) 20 MG tablet Take 20 mg by mouth daily as needed. For erectile dysfunction  . Tiotropium Bromide Monohydrate (SPIRIVA RESPIMAT) 2.5 MCG/ACT AERS Inhale 2 puffs into the lungs every morning.  . traZODone (DESYREL) 50 MG tablet Take 1/2 to 1 tab at bed time  . vitamin B-12 (CYANOCOBALAMIN) 1000 MCG tablet Take 1,000 mcg by mouth every morning.  . [DISCONTINUED] hydrOXYzine (VISTARIL) 25  MG capsule Take 1 - 2 capsules by mouth daily  At bedtime as needed  . [DISCONTINUED] mirtazapine (REMERON) 30 MG tablet TAKE ONE TABLET BY MOUTH AT BEDTIME  . [DISCONTINUED] pravastatin (PRAVACHOL) 80 MG tablet Take 80 mg by mouth at bedtime.   No facility-administered encounter medications on file as of 10/01/2015.    Past Psychiatric History/Hospitalization(s): Patient has history of depression since 19.  He has one suicidal attempt .  In the past he had tried Zoloft Paxil Prozac and Wellbutrin  but he stopped due to side effects.  Patient has significant history of using cocaine marijuana and alcohol for 20 years.  He claims to be sober since she's been coming to this office.   Anxiety: Yes Bipolar Disorder: No Depression: No Mania: No Psychosis: No Schizophrenia: No Personality Disorder: No Hospitalization for psychiatric illness: Yes History of Electroconvulsive Shock Therapy: No Prior Suicide Attempts: Yes  Physical Exam: Constitutional:  BP 126/90 mmHg  Pulse 89  Ht 5\' 9"  (1.753 m)  Wt 204 lb 3.2 oz (92.625 kg)  BMI 30.14 kg/m2  General Appearance: Patient appears tired, shortness of breath and difficulty walking.    Musculoskeletal: Strength & Muscle Tone: within normal limits Gait & Station: unsteady due to pain  Patient leans: Front Review of Systems  Constitutional: Positive for malaise/fatigue.  Respiratory: Positive for shortness of breath.   Psychiatric/Behavioral: Negative for suicidal ideas and hallucinations.   Mental status examination: Patient is casually dressed and fairly groomed.  He is using wheelchair for ambulation.  He has a oxygen as he has difficulty breathing.  He described his mood euthymic and his affect is appropriate.  He denies any auditory or visual hallucination.  He denies any active or passive suicidal parts or homicidal thought.  His attention and concentration is fair.  His thought processes slow but logical and goal-directed.  There were no delusions, paranoia or any obsessive thoughts.  His psychomotor activity is slow.  He is alert and oriented 3 .  His cognition is intact.  His insight judgment and impulse control is okay.  Established Problem, Stable/Improving (1), Review or order clinical lab tests (1), Review of Last Therapy Session (1) and Review of Medication Regimen & Side Effects (2)  Assessment: Axis I: Maj. depressive disorder, polysubstance dependence in complete remission  Axis II: Deferred  Axis III: See medical  history  Plan:  Patient is a stable on Remeron 30 mg at bedtime and Vistaril 25 mg at bedtime.  Discussed medication side effects and benefits.  He has enough trazodone which she takes only as needed.  Recommended to call us back if he has any question or any concern.  Follow-up in 3 months.  Mikie Misner T., MD 10/01/2015

## 2015-10-14 DIAGNOSIS — G4733 Obstructive sleep apnea (adult) (pediatric): Secondary | ICD-10-CM | POA: Diagnosis not present

## 2015-10-14 DIAGNOSIS — G4730 Sleep apnea, unspecified: Secondary | ICD-10-CM | POA: Diagnosis not present

## 2015-10-20 ENCOUNTER — Telehealth: Payer: Self-pay | Admitting: Internal Medicine

## 2015-10-20 NOTE — Telephone Encounter (Signed)
atc X2, line rang to fast busy signal. Wcb.  

## 2015-10-21 NOTE — Telephone Encounter (Addendum)
Spoke with pt. He is inquiring about tier exceptions for Symbicort and Spiriva. These have been completed via Cover My Meds. Symbicort Key: [id number]. Spiriva Key: [id number]. Will await decisions.

## 2015-10-21 NOTE — Telephone Encounter (Signed)
Patient calling back -prm

## 2015-10-21 NOTE — Telephone Encounter (Signed)
[phone], pt cb

## 2015-10-21 NOTE — Telephone Encounter (Signed)
LM for patient x 1 

## 2015-10-24 DIAGNOSIS — J449 Chronic obstructive pulmonary disease, unspecified: Secondary | ICD-10-CM | POA: Diagnosis not present

## 2015-10-24 MED ORDER — TIOTROPIUM BROMIDE MONOHYDRATE 2.5 MCG/ACT IN AERS
2.0000 | INHALATION_SPRAY | Freq: Every day | RESPIRATORY_TRACT | Status: DC
Start: 2015-10-24 — End: 2015-11-10

## 2015-10-24 MED ORDER — BUDESONIDE-FORMOTEROL FUMARATE 160-4.5 MCG/ACT IN AERO: 2.0000 | INHALATION_SPRAY | Freq: Two times a day (BID) | RESPIRATORY_TRACT | Status: DC

## 2015-10-24 NOTE — Telephone Encounter (Signed)
2 week samples of each. Ov with NP and fomulary before samples run out

## 2015-10-24 NOTE — Telephone Encounter (Signed)
Spoke with patient-states he rec'd denial for Symbicort and Spiriva tier exceptions and would like to know what to do now. MW please advise. Thanks.

## 2015-10-24 NOTE — Telephone Encounter (Signed)
[phone] calling about his meds states they were denied

## 2015-10-24 NOTE — Telephone Encounter (Signed)
Pt has appt to see TP on Tuesday 10-28-15 at 4:00pm; samples of each left a front desk for pick up. Pt is aware to bring formulary list. Nothing more needed at this time.

## 2015-10-28 ENCOUNTER — Ambulatory Visit: Payer: Self-pay | Admitting: Adult Health

## 2015-11-07 ENCOUNTER — Telehealth: Payer: Self-pay | Admitting: Internal Medicine

## 2015-11-07 ENCOUNTER — Ambulatory Visit: Payer: Self-pay | Admitting: Internal Medicine

## 2015-11-07 MED ORDER — AZITHROMYCIN 250 MG PO TABS
ORAL_TABLET | ORAL | Status: DC
Start: 2015-11-07 — End: 2016-02-10

## 2015-11-07 NOTE — Telephone Encounter (Signed)
Zpak, see Monday

## 2015-11-07 NOTE — Telephone Encounter (Signed)
Spoke with the pt and notified of recs per MW  He verbalized understanding  Nothing further needed  

## 2015-11-07 NOTE — Telephone Encounter (Signed)
I spoke with the pt  He states he cancelled his PFT and ov with MW for today b/c he was up all night coughing and he is feeling tired today He developed a cold over a wk ago and is having increased SOB and minimal wheezing  I got him to reschedule appt for Monday (he did not want to come any sooner) He is coughing up mininal yellow sputum   Dr Melvyn Novas, please advise thanks (he is the last AVS)  No Known Allergies

## 2015-11-10 ENCOUNTER — Ambulatory Visit (INDEPENDENT_AMBULATORY_CARE_PROVIDER_SITE_OTHER): Payer: Medicare Other | Admitting: Internal Medicine

## 2015-11-10 ENCOUNTER — Encounter: Payer: Self-pay | Admitting: Internal Medicine

## 2015-11-10 VITALS — BP 126/80 | HR 101 | Ht 70.0 in | Wt 202.0 lb

## 2015-11-10 DIAGNOSIS — J9611 Chronic respiratory failure with hypoxia: Secondary | ICD-10-CM

## 2015-11-10 DIAGNOSIS — J441 Chronic obstructive pulmonary disease with (acute) exacerbation: Secondary | ICD-10-CM

## 2015-11-10 MED ORDER — PREDNISONE 10 MG PO TABS: ORAL_TABLET | ORAL | Status: DC

## 2015-11-10 MED ORDER — FORMOTEROL FUMARATE 20 MCG/2ML IN NEBU: 20.0000 ug | INHALATION_SOLUTION | Freq: Two times a day (BID) | RESPIRATORY_TRACT | Status: DC

## 2015-11-10 MED ORDER — BUDESONIDE 0.25 MG/2ML IN SUSP: RESPIRATORY_TRACT | Status: DC

## 2015-11-10 MED ORDER — IPRATROPIUM-ALBUTEROL 0.5-2.5 (3) MG/3ML IN SOLN: 3.0000 mL | RESPIRATORY_TRACT | Status: DC | PRN

## 2015-11-10 NOTE — Patient Instructions (Addendum)
Prednisone 10 mg take  4 each am x 2 days,   2 each am x 2 days,  1 each am x 2 days and stop   Plan A = Automatic = Budesonide 0.25 mg / perforomist 20 mcg every 12 hours per neb (instead of symbicort and spiriva)  Plan B = Backup Only use your albuterol as a rescue medication to be used if you can't catch your breath by resting or doing a relaxed purse lip breathing pattern.  - The less you use it, the better it will work when you need it. - Ok to use the inhaler up to 2 puffs  every 4 hours if you must but call for appointment if use goes up over your usual need - Don't leave home without it !!  (think of it like the spare tire for your car)   Plan C = Crisis - only use your albuterol/ipatropium  nebulizer if you first try Plan B and it fails to help > ok to use the nebulizer up to every 4 hours but if start needing it regularly call for immediate appointment    Please schedule a follow up visit in 3 months but call sooner if needed with pft on return

## 2015-11-10 NOTE — Assessment & Plan Note (Signed)
Placed on 02 at discharge p THR 03/2013 3h/day and cpap at hs RA sats at rest 94% 06/27/14  so rec use 02 with activity only  - 11/29/2014   Walked RA x one lap @ 185 stopped due to hip pain/ sob with sat 88% > referred for POC  - 05/28/2015 sats ok at rest RA/  Walked on 3lpm  x one lap @ 185 stopped due to sob/ chest tightness but sats ok   As of 11/10/2015 rec 3lpm with any activity/ and 2lpm hs/ none at rest unless sob at rest

## 2015-11-10 NOTE — Progress Notes (Signed)
Subjective:    Patient ID: [name], male    DOB: 1950-07-26  MRN: [id number]    Brief patient profile:  24 yobm quit smoking 12/03/14 on disability due to depression since 1994 baseline on spiriva with  doe room to room s 02 then underwent L THR at [facility] and noted even in hosp need 02 with any activity so referred to pulmonary clinic 05/01/2013 by [facility] with GOLD III copd with mild reversibility documented 06/12/13    History of Present Illness  05/01/2013 1st [facility] office visit/ Bertrice Leder cc doe x room to room if not on 02 using walker ever since mobilized from surgery 04/03/13 s cough, some chest tightness once or twice per week esp after cigarette use, has not tried inhalers at home (other than spiriva)  but nebs in hosp seemed to help rec Start symbicort 160 Take 2 puffs first thing in am and then another 2 puffs about 12 hours later.  Continue spiriva one in am Work on inhaler technique:    Please remember to go to the lab and x-ray department downstairs for your tests - we will call you with the results when they are available. The key is to stop smoking completely before smoking completely stops you!   08/30/2013 f/u ov/Jackquelyn Sundberg re: GOLD III copd with reversibilty on spiriva and symbicort 160 2bid Chief Complaint  Patient presents with  . Follow-up    Pt states breathing has been worse since for the past 2 days since he ran out of symbicort. Did not bring his formulary list from ins.   confused with instructions on how to use meds, esp saba, but now limited by hip >> sob so w/c bound and still smoking  >>rec Continue your present medications but work on hard on not smoking - this is the most important aspect of your care! Ok to use the 02 with activity, not needed at rest  Only use your albuterol (proair) as a rescue medication   09/14/2013 Follow up  Returns for follow up and med review  We reviewed all his medications and organized them into a medication calendar with  patient education. It appears that he is taking his medications correctly. It is hard for him to afford all of his co-pays at times. We discussed possible options w/ his formulary.-at this time cont on current regimen . If becomes too expensive will look at neb in place of maintenance meds .  rec Continue on current regimen  Work on not smoking  Follow med calendar closely and bring to each visit.    11/29/2014 f/u ov/Meghanne Pletz re: GOLD III copd/ quit smoking 11/24/14/ symb/spiriva/ min saba need/ no med calendar  Chief Complaint  Patient presents with  . Follow-up    Breathing about the same since the last visit. He denies any new co's today.   Min  Am cough  congestion / limited L hip > breathing requesting POC rec Please see patient coordinator before you leave today  to schedule evaluation for ambulatory 02 concentrator    05/28/2015  f/u ov/Liba Hulsey re: symbicort / spiriva maint rx / no med calendar  Chief Complaint  Patient presents with  . Follow-up    Pt states his breathing is unchanged. He rarely uses albuterol inhaler.   sleeps on cpap/ just using 02 with activity  Dispute between wife and pt re sob severity / he is becoming more and more sedentary ? hiip vs sob  No cough /congestion  rec  No change recs - try to be more active but always wear the 3lpm with exertion     11/10/2015  Acute extended  ov/Juanell Saffo re: aecopd/ on symb/spiriva can't afford rx ? compliant  Chief Complaint  Patient presents with  . Acute Visit    Pt c/o increased SOB, and cough x 2 wks. He states he can only take shallow breaths. He is currently on zithromax.    using hfa saba sev times per day s much relief  No obvious day to day or daytime variabilty or assoc excess/ purulent sputum or mucus plugs     cp or chest tightness, subjective wheeze overt sinus or hb symptoms. No unusual exp hx or h/o childhood pna/ asthma or knowledge of premature birth.  Sleeping ok without nocturnal  or early am exacerbation   of respiratory  c/o's or need for noct saba. Also denies any obvious fluctuation of symptoms with weather or environmental changes or other aggravating or alleviating factors except as outlined above   Current Medications, Allergies, Complete Past Medical History, Past Surgical History, Family History, and Social History were reviewed in [employer] record.     ROS  The following are not active complaints unless bolded sore throat, dysphagia, dental problems, itching, sneezing,  nasal congestion or excess/ purulent secretions, ear ache,   fever, chills, sweats, unintended wt loss, pleuritic or exertional cp, hemoptysis,  orthopnea pnd or leg swelling, presyncope, palpitations, heartburn, abdominal pain, anorexia, nausea, vomiting, diarrhea  or change in bowel or urinary habits, change in stools or urine, dysuria,hematuria,  rash, arthralgias, visual complaints, headache, numbness weakness or ataxia or problems with walking due to L Hip or coordination,  change in mood/affect or memory.           Objective:   Physical Exam  W/c bm nad / vital signs reviewed   06/12/2013   200 >  08/30/2013  189 >186 09/14/2013 > 10/30/2013  184> 01/28/2014  184 > 03/06/14  194 > 06/26/2014  200> 11/29/2014  200> 05/28/2015  212 > 11/10/2015   202  HEENT mild turbinate edema.  Oropharynx no thrush or excess pnd or cobblestoning.  No JVD or cervical adenopathy. Mild accessory muscle hypertrophy. Trachea midline, nl thryroid. Chest was hyperinflated by percussion with diminished breath sounds and moderate increased exp time with mid exp wheeze. Hoover sign positive at mid inspiration. Regular rate and rhythm without murmur gallop or rub or increase P2 or edema.  Abd: no hsm, nl excursion. Ext warm without cyanosis or clubbing.          Assessment & Plan:

## 2015-11-11 ENCOUNTER — Other Ambulatory Visit: Payer: Self-pay | Admitting: Internal Medicine

## 2015-11-11 ENCOUNTER — Encounter: Payer: Self-pay | Admitting: Internal Medicine

## 2015-11-11 DIAGNOSIS — J449 Chronic obstructive pulmonary disease, unspecified: Secondary | ICD-10-CM | POA: Diagnosis not present

## 2015-11-11 DIAGNOSIS — J441 Chronic obstructive pulmonary disease with (acute) exacerbation: Secondary | ICD-10-CM | POA: Insufficient documentation

## 2015-11-11 NOTE — Assessment & Plan Note (Signed)
DDX of  difficult airways management almost all start with A and  include Adherence, Ace Inhibitors, Acid Reflux, Active Sinus Disease, Alpha 1 Antitripsin deficiency, Anxiety masquerading as Airways dz,  ABPA,  Allergy(esp in young), Aspiration (esp in elderly), Adverse effects of meds,  Active smokers, A bunch of PE's (a small clot burden can't cause this syndrome unless there is already severe underlying pulm or vascular dz with poor reserve) plus two Bs  = Bronchiectasis and Beta blocker use..and one C= CHF  Adherence is always the initial "prime suspect" and is a multilayered concern that requires a "trust but verify" approach in every patient - starting with knowing how to use medications, especially inhalers, correctly, keeping up with refills and understanding the fundamental difference between maintenance and prns vs those medications only taken for a very short course and then stopped and not refilled.  - can't afford meds, doubt he's compliant - 11/10/2015  After extensive coaching HFA effectiveness =    75% with short Ti limiting - try laba/ics neb if costs work out as the only "non-tier 3" meds on list are nebs and use duoneb as backup  ? Allergy/asthma component > Prednisone 10 mg take  4 each am x 2 days,   2 each am x 2 days,  1 each am x 2 days and stop   ? BB effect > unlikely with low dose tenormin, might be an issue with higher dosing  ? Anxiety > usually dx of exclusion but higher on his list   I had an extended discussion with the patient reviewing all relevant studies completed to date and  lasting 25 minutes of a 40  minute extened acute office  visit    Formulary restrictions will be an ongoing challenge for the forseable future and I would be happy to pick an alternative if the pt will first  provide me a list of them but pt  will need to return here for training for any new device that is required eg dpi vs hfa vs respimat.    In meantime we can always provide samples so  the patient never runs out of any needed respiratory medications.   Each maintenance medication was reviewed in detail including most importantly the difference between maintenance and prns and under what circumstances the prns are to be triggered using an action plan format that is not reflected in the computer generated alphabetically organized AVS.    Please see instructions for details which were reviewed in writing and the patient given a copy highlighting the part that I personally wrote and discussed at today's ov.

## 2015-11-13 DIAGNOSIS — G4730 Sleep apnea, unspecified: Secondary | ICD-10-CM | POA: Diagnosis not present

## 2015-11-13 DIAGNOSIS — G4733 Obstructive sleep apnea (adult) (pediatric): Secondary | ICD-10-CM | POA: Diagnosis not present

## 2015-11-14 ENCOUNTER — Telehealth: Payer: Self-pay | Admitting: Internal Medicine

## 2015-11-14 NOTE — Telephone Encounter (Signed)
Last ov with MW on 11/10/15 Patient Instructions     Prednisone 10 mg take 4 each am x 2 days, 2 each am x 2 days, 1 each am x 2 days and stop   Plan A = Automatic = Budesonide 0.25 mg / perforomist 20 mcg every 12 hours per neb (instead of symbicort and spiriva)  Plan B = Backup Only use your albuterol as a rescue medication to be used if you can't catch your breath by resting or doing a relaxed purse lip breathing pattern.  - The less you use it, the better it will work when you need it. - Ok to use the inhaler up to 2 puffs every 4 hours if you must but call for appointment if use goes up over your usual need - Don't leave home without it !! (think of it like the spare tire for your car)   Plan C = Crisis - only use your albuterol/ipatropium nebulizer if you first try Plan B and it fails to help > ok to use the nebulizer up to every 4 hours but if start needing it regularly call for immediate appointment   Please schedule a follow up visit in 3 months but call sooner if needed with pft on return    Called spoke with pt. He states that the respiratory therapist from [facility] told him he needed to contact our office to discuss the correct way to take the medication. I reviewed MW's recs from the ov. He voiced understanding and had no further questions. Nothing further needed.

## 2015-11-20 ENCOUNTER — Other Ambulatory Visit (HOSPITAL_COMMUNITY): Payer: Self-pay | Admitting: Psychiatry

## 2015-11-21 ENCOUNTER — Other Ambulatory Visit (HOSPITAL_COMMUNITY): Payer: Self-pay | Admitting: Psychiatry

## 2015-11-23 DIAGNOSIS — J449 Chronic obstructive pulmonary disease, unspecified: Secondary | ICD-10-CM | POA: Diagnosis not present

## 2015-12-15 DIAGNOSIS — G4730 Sleep apnea, unspecified: Secondary | ICD-10-CM | POA: Diagnosis not present

## 2015-12-15 DIAGNOSIS — G4733 Obstructive sleep apnea (adult) (pediatric): Secondary | ICD-10-CM | POA: Diagnosis not present

## 2015-12-16 ENCOUNTER — Other Ambulatory Visit (HOSPITAL_COMMUNITY): Payer: Self-pay | Admitting: Psychiatry

## 2015-12-16 DIAGNOSIS — F330 Major depressive disorder, recurrent, mild: Secondary | ICD-10-CM

## 2015-12-24 DIAGNOSIS — I519 Heart disease, unspecified: Secondary | ICD-10-CM | POA: Diagnosis not present

## 2015-12-24 DIAGNOSIS — E119 Type 2 diabetes mellitus without complications: Secondary | ICD-10-CM | POA: Diagnosis not present

## 2015-12-24 DIAGNOSIS — I10 Essential (primary) hypertension: Secondary | ICD-10-CM | POA: Diagnosis not present

## 2015-12-24 DIAGNOSIS — J441 Chronic obstructive pulmonary disease with (acute) exacerbation: Secondary | ICD-10-CM | POA: Diagnosis not present

## 2015-12-24 DIAGNOSIS — J449 Chronic obstructive pulmonary disease, unspecified: Secondary | ICD-10-CM | POA: Diagnosis not present

## 2015-12-31 ENCOUNTER — Other Ambulatory Visit: Payer: Self-pay | Admitting: Pharmacist

## 2015-12-31 ENCOUNTER — Telehealth: Payer: Self-pay | Admitting: Internal Medicine

## 2015-12-31 NOTE — Telephone Encounter (Signed)
Ok to do dulera 200 in place of symbicort and sample of spiriva until has a chance to return here to regroup with formulary alternatives in hand

## 2015-12-31 NOTE — Patient Outreach (Addendum)
Follow up with patient's Pulmonologist, Dr. Melvyn Novas [phone]) to discuss prescribing Advair or Dulera as an alternative to the patient's Symbicort or Anoro as an alternative to the patient's Spiriva and long-acting beta2-agonist portion of his Symbicort inhaler, as patient is in coverage gap of his insurance and unable to afford his Symbicort or Spiriva.   Leave a message with Sharyn Lull in Dr. Gustavus Bryant office. If have not heard back from the office by 01/02/16, will follow up again at that time.  [name], PharmD Clinical Pharmacist [facility] [phone]

## 2015-12-31 NOTE — Patient Outreach (Addendum)
Outreach call to [name] regarding his request for follow up from the [facility] Medication Adherence Campaign. Left a HIPAA compliant message on the patient's voicemail.  Receive a call back from Mr. [name]. HIPAA identifiers verified and verbal consent received. Mr. [name] reports that he has been taking his metformin 500 mg twice daily as directed. Patient denies any barriers to adherence, such as cost or side effects. Denies any missed doses.  Patient reports that he has been having difficulty with affordability of his Symbicort and Spiriva inhalers. Reports that he is currently in the coverage gap of his insurance. Reports that he has applied for and been denied extra help this year. Reports that he was denied patient assistance from [place] for his Symbicort as he had not met the 3% of household out of pocket expenditure requirement.   Patient reports that he currently has samples of Symbicort and Spiriva from his pulmonologist, Dr. Melvyn Novas. Also reports that prior to entering the coverage gap, he received formoterol, budesonide and Duoneb nebulizer solution as prescribed by Dr. Melvyn Novas. Reports that the formoterol was too expensive for him to order again. However, reports that he has these medications on hand for when he runs out of his Symbicort and Spiriva samples  Patient reports that he has contacted [employer] and his out of pocket expense thus far for the year is $697. Based on this and the patient's reported household income, patient appears to meet eligibility requirements for the [place] patient assistance program for Advair or Anoro as well as the eligibility requirements for [employer]'s patient assistance program for [facility].  Offer to follow up with patient's Pulmonologist, Dr. Melvyn Novas [phone]) to discuss prescribing Advair or Dulera as an alternative to the patient's Symbicort or Anoro as an alternative to the patient's Spiriva and long-acting beta2-agonist portion of his Symbicort inhaler.    Let Mr. [name] know that I will call him back once I have heard back from Dr. Gustavus Bryant office. Patient confirms that he has my phone number.  Patient states that he has no further medication questions or concerns for me at this time.  Harlow Asa, PharmD Clinical Pharmacist [facility] [phone]

## 2015-12-31 NOTE — Telephone Encounter (Signed)
Harlow Asa from [facility] called regarding patient's medications. Patient cannot afford the Symbicort and Spiriva.  Patient assistance - patient would qualify for the following:  Advair instead of Symbicort Anoro instead of Symbicort or Spiriva Dulera instead of Advair or Symbicort.  Dr. Melvyn Novas, please advise.    Instructions   Prednisone 10 mg take  4 each am x 2 days,   2 each am x 2 days,  1 each am x 2 days and stop   Plan A = Automatic = Budesonide 0.25 mg / perforomist 20 mcg every 12 hours per neb (instead of symbicort and spiriva)  Plan B = Backup Only use your albuterol as a rescue medication to be used if you can't catch your breath by resting or doing a relaxed purse lip breathing pattern.  - The less you use it, the better it will work when you need it. - Ok to use the inhaler up to 2 puffs  every 4 hours if you must but call for appointment if use goes up over your usual need - Don't leave home without it !!  (think of it like the spare tire for your car)   Plan C = Crisis - only use your albuterol/ipatropium  nebulizer if you first try Plan B and it fails to help > ok to use the nebulizer up to every 4 hours but if start needing it regularly call for immediate appointment    Please schedule a follow up visit in 3 months but call sooner if needed with pft on return

## 2016-01-01 ENCOUNTER — Ambulatory Visit (HOSPITAL_COMMUNITY): Payer: Self-pay | Admitting: Psychiatry

## 2016-01-01 MED ORDER — TIOTROPIUM BROMIDE MONOHYDRATE 2.5 MCG/ACT IN AERS: 2.0000 | INHALATION_SPRAY | Freq: Every morning | RESPIRATORY_TRACT | 0 refills | Status: DC

## 2016-01-01 MED ORDER — MOMETASONE FURO-FORMOTEROL FUM 200-5 MCG/ACT IN AERO: 2.0000 | INHALATION_SPRAY | Freq: Two times a day (BID) | RESPIRATORY_TRACT | 0 refills | Status: DC

## 2016-01-01 NOTE — Telephone Encounter (Signed)
lmtcb x1 for Dr. Marius Ditch. Samples of Dulera and Spiriva Respimat have been left at the front desk for pick up.

## 2016-01-02 ENCOUNTER — Other Ambulatory Visit: Payer: Self-pay | Admitting: Pharmacist

## 2016-01-02 NOTE — Patient Outreach (Signed)
Call to follow up with Mr. Maston. Let patient know that I spoke with Truman Hayward in Dr. Gustavus Bryant office and that per Truman Hayward, the office will be helping the patient to complete the patient assistance forms for [facility]. Let Mr. Debose know that Truman Hayward indicated that she would be following up with him directly.  Mr. Applin expresses thanks for my assistance and confirms that he has my phone number to call for future medication questions or concerns.  Will close pharmacy episode now.  Harlow Asa, PharmD Clinical Pharmacist [facility] [phone]

## 2016-01-02 NOTE — Telephone Encounter (Signed)
[name] calling back [phone]

## 2016-01-02 NOTE — Telephone Encounter (Signed)
Called and spoke with [name] and she stated that the problem is that the pt is currently in the donut hole so changing the meds will not help as his insurance still will not cover the meds.  She stated that he will qualify for the dulera patient assistance so if we could start the process for this.  Forms have been filled out and placed in MW look at to be signed.  Once we get these back I will call the pt to have him pick up the samples up front and he will need to sign these forms and drop off any income forms to be sent in with the pt assistance forms.

## 2016-01-02 NOTE — Patient Outreach (Signed)
Receive a voicemail from Elida in Dr. Gustavus Bryant office requesting that I return her call at [phone]. Leave a message requesting a call back.  Note per EPIC Chart review, Dr. Melvyn Novas responded to the message that I left on 12/31/15, stating "Ok to do dulera 200 in place of symbicort and sample of spiriva until has a chance to return here to regroup with formulary alternatives in hand."  Receive a call back from [facility] in Dr. Gustavus Bryant office. Let Truman Hayward know that the patient is currently in the coverage gap, so that rather than a formulary alternative, patient is looking for patient assistance options. Let Truman Hayward know that based on the details of the patient assistance program for [facility], patient appears to qualify for this program. Truman Hayward states that the office will go ahead and start the patient assistance paperwork for [facility] through [employer] for this patient and provide patient with samples of the Spiriva.  Truman Hayward states that the office will follow up with the patient to complete this process.  Harlow Asa, PharmD Clinical Pharmacist [facility] [phone]

## 2016-01-02 NOTE — Telephone Encounter (Signed)
lmtcb x2 for Dr. Liliana Cline.

## 2016-01-05 NOTE — Telephone Encounter (Signed)
MW please advise if these forms have been signed. Thanks.

## 2016-01-05 NOTE — Telephone Encounter (Signed)
Forms signed by MW. Pt is needing to sign forms and get requested information.  I called patient and is requesting form to be left for pick up. I have done so. According to note 01/01/16 samples were already left for pick up.

## 2016-01-05 NOTE — Telephone Encounter (Signed)
I think I signed it last week but be sure he has samples to bridge the gap as my intent is for him never to be out of meds

## 2016-01-06 DIAGNOSIS — M545 Low back pain: Secondary | ICD-10-CM | POA: Diagnosis not present

## 2016-01-06 DIAGNOSIS — M25551 Pain in right hip: Secondary | ICD-10-CM | POA: Diagnosis not present

## 2016-01-06 DIAGNOSIS — M25552 Pain in left hip: Secondary | ICD-10-CM | POA: Diagnosis not present

## 2016-01-07 ENCOUNTER — Telehealth: Payer: Self-pay | Admitting: Internal Medicine

## 2016-01-07 NOTE — Telephone Encounter (Signed)
Pt wanted to make sure he understood how many puffs and how many times of day he is to use the [facility] inhaler. Pt is aware to take 2 puffs BID of dulera. Nothing more needed at this time.

## 2016-01-14 DIAGNOSIS — G4730 Sleep apnea, unspecified: Secondary | ICD-10-CM | POA: Diagnosis not present

## 2016-01-14 DIAGNOSIS — G4733 Obstructive sleep apnea (adult) (pediatric): Secondary | ICD-10-CM | POA: Diagnosis not present

## 2016-01-24 DIAGNOSIS — J449 Chronic obstructive pulmonary disease, unspecified: Secondary | ICD-10-CM | POA: Diagnosis not present

## 2016-02-07 ENCOUNTER — Other Ambulatory Visit (HOSPITAL_COMMUNITY): Payer: Self-pay | Admitting: Psychiatry

## 2016-02-07 DIAGNOSIS — F330 Major depressive disorder, recurrent, mild: Secondary | ICD-10-CM

## 2016-02-10 ENCOUNTER — Ambulatory Visit (INDEPENDENT_AMBULATORY_CARE_PROVIDER_SITE_OTHER): Payer: Medicare Other | Admitting: Internal Medicine

## 2016-02-10 ENCOUNTER — Encounter (INDEPENDENT_AMBULATORY_CARE_PROVIDER_SITE_OTHER): Payer: Medicare Other | Admitting: Internal Medicine

## 2016-02-10 ENCOUNTER — Encounter: Payer: Self-pay | Admitting: Internal Medicine

## 2016-02-10 VITALS — BP 122/80 | HR 95 | Ht 70.0 in | Wt 200.0 lb

## 2016-02-10 DIAGNOSIS — J449 Chronic obstructive pulmonary disease, unspecified: Secondary | ICD-10-CM

## 2016-02-10 DIAGNOSIS — J9611 Chronic respiratory failure with hypoxia: Secondary | ICD-10-CM

## 2016-02-10 DIAGNOSIS — Z23 Encounter for immunization: Secondary | ICD-10-CM | POA: Diagnosis not present

## 2016-02-10 LAB — PULMONARY FUNCTION TEST
DL/VA % pred: 37 %
DL/VA: 1.73 ml/min/mmHg/L
DLCO cor % pred: 24 %
DLCO cor: 7.94 ml/min/mmHg
DLCO unc % pred: 24 %
DLCO unc: 8.01 ml/min/mmHg
FEF 25-75 Post: 0.4 L/s
FEF 25-75 Pre: 0.43 L/s
FEF2575-%Change-Post: -8 %
FEF2575-%Pred-Post: 15 %
FEF2575-%Pred-Pre: 16 %
FEV1-%Change-Post: -6 %
FEV1-%Pred-Post: 35 %
FEV1-%Pred-Pre: 38 %
FEV1-Post: 1.06 L
FEV1-Pre: 1.13 L
FEV1FVC-%Change-Post: -2 %
FEV1FVC-%Pred-Pre: 57 %
FEV6-%Change-Post: -2 %
FEV6-%Pred-Post: 62 %
FEV6-%Pred-Pre: 63 %
FEV6-Post: 2.33 L
FEV6-Pre: 2.39 L
FEV6FVC-%Change-Post: 1 %
FEV6FVC-%Pred-Post: 99 %
FEV6FVC-%Pred-Pre: 98 %
FVC-%Change-Post: -3 %
FVC-%Pred-Post: 62 %
FVC-%Pred-Pre: 65 %
FVC-Post: 2.44 L
FVC-Pre: 2.54 L
Post FEV1/FVC ratio: 44 %
Post FEV6/FVC ratio: 95 %
Pre FEV1/FVC ratio: 45 %
Pre FEV6/FVC Ratio: 94 %
RV % pred: 193 %
RV: 4.59 L
TLC % pred: 106 %
TLC: 7.43 L

## 2016-02-10 MED ORDER — FORMOTEROL FUMARATE 20 MCG/2ML IN NEBU: 20.0000 ug | INHALATION_SOLUTION | Freq: Two times a day (BID) | RESPIRATORY_TRACT | 11 refills | Status: DC

## 2016-02-10 MED ORDER — BUDESONIDE 0.25 MG/2ML IN SUSP: RESPIRATORY_TRACT | 12 refills | Status: DC

## 2016-02-10 NOTE — Patient Instructions (Addendum)
Plan A = Automatic =  Performist(formoterol) with budesonide(Pulmonary)  = symbicort/dulera   Twice daily through Medicare B                                       Ok to use up your spiriva remaining if can't afford to refill it  Plan B = Backup Only use your albuterol (Proiar)  as a rescue medication to be used if you can't catch your breath by resting or doing a relaxed purse lip breathing pattern.  - The less you use it, the better it will work when you need it. - Ok to use the inhaler up to 2 puffs  every 4 hours if you must but call for appointment if use goes up over your usual need - Don't leave home without it !!  (think of it like the spare tire for your car)   Plan C = Crisis - only use your albuterol/ipatropium nebulizer if you first try Plan B and it fails to help > ok to use the nebulizer up to every 4 hours but if start needing it regularly call for immediate appointment   See Tammy NP in 4  weeks with all your medications, inhalers/ solutions/ even over the counter meds, separated in two separate bags, the ones you take no matter what vs the ones you stop once you feel better and take only as needed when you feel you need them.   Tammy  will generate for you a new user friendly medication calendar that will put [place] all on the same page re: your medication use.     Without this process, it simply isn't possible to assure that we are providing  your outpatient care  with  the attention to detail we feel you deserve.   If we cannot assure that you're getting that kind of care,  then we cannot manage your problem effectively from this clinic.  Once you have seen Tammy and we are sure that we're all on the same page with your medication use she will arrange follow up with me.

## 2016-02-10 NOTE — Progress Notes (Signed)
Subjective:    Patient ID: [name], male    DOB: October 19, 1950  MRN: [id number]    Brief patient profile:  61 yobm quit smoking 12/03/14 on disability due to depression since 1994 baseline on spiriva with  doe room to room s 02 then underwent L THR 03/2013  at [facility] and noted even in hosp need 02 with any activity so referred to pulmonary clinic 05/01/2013 by [facility] with GOLD III copd with mild reversibility documented 06/12/13    History of Present Illness  05/01/2013 1st [facility] office visit/ [name] cc doe x room to room if not on 02 using walker ever since mobilized from surgery 04/03/13 s cough, some chest tightness once or twice per week esp after cigarette use, has not tried inhalers at home (other than spiriva)  but nebs in hosp seemed to help rec Start symbicort 160 Take 2 puffs first thing in am and then another 2 puffs about 12 hours later.  Continue spiriva one in am Work on inhaler technique:    Please remember to go to the lab and x-ray department downstairs for your tests - we will call you with the results when they are available. The key is to stop smoking completely before smoking completely stops you!     09/14/2013 Follow up  Returns for follow up and med review  We reviewed all his medications and organized them into a medication calendar with patient education. It appears that he is taking his medications correctly. It is hard for him to afford all of his co-pays at times. We discussed possible options w/ his formulary.-at this time cont on current regimen . If becomes too expensive will look at neb in place of maintenance meds .  rec Continue on current regimen  Work on not smoking  Follow med calendar closely and bring to each visit.    11/29/2014 f/u ov/[name] re: GOLD III copd/ quit smoking 11/24/14/ symb/spiriva/ min saba need/ no med calendar  Chief Complaint  Patient presents with  . Follow-up    Breathing about the same since the last visit. He  denies any new co's today.   Min  Am cough  congestion / limited L hip > breathing requesting POC rec Please see patient coordinator before you leave today  to schedule evaluation for ambulatory 02 concentrator    11/10/2015  Acute extended  ov/[name] re: aecopd/ on symb/spiriva can't afford rx ? compliant  Chief Complaint  Patient presents with  . Acute Visit    Pt c/o increased SOB, and cough x 2 wks. He states he can only take shallow breaths. He is currently on zithromax.    using hfa saba sev times per day s much relief  rec Prednisone 10 mg take  4 each am x 2 days,   2 each am x 2 days,  1 each am x 2 days and stop  Plan A = Automatic = Budesonide 0.25 mg / perforomist 20 mcg every 12 hours per neb (instead of symbicort and spiriva) Plan B = Backup Only use your albuterol as a rescue medication Plan C = Crisis - only use your albuterol/ipatropium  nebulizer if you first try Plan B and it fails to help > ok to use the nebulizer up to every 4 hours but if start needing it regularly call for immediate appointment   02/10/2016  f/u ov/[name] re: COPD GOLD III/ dulera 200/ spiriva /no med calendar  Chief Complaint  Patient presents with  .  Follow-up    PFT's done today. Breathing has improved since the last visit.   ex x 15-20 min s stopping at 2lpm / 2lpm hs   No obvious day to day or daytime variabilty or assoc excess/ purulent sputum or mucus plugs     cp or chest tightness, subjective wheeze overt sinus or hb symptoms. No unusual exp hx or h/o childhood pna/ asthma or knowledge of premature birth.  Sleeping ok without nocturnal  or early am exacerbation  of respiratory  c/o's or need for noct saba. Also denies any obvious fluctuation of symptoms with weather or environmental changes or other aggravating or alleviating factors except as outlined above   Current Medications, Allergies, Complete Past Medical History, Past Surgical History, Family History, and Social History were  reviewed in [employer] record.     ROS  The following are not active complaints unless bolded sore throat, dysphagia, dental problems, itching, sneezing,  nasal congestion or excess/ purulent secretions, ear ache,   fever, chills, sweats, unintended wt loss, pleuritic or exertional cp, hemoptysis,  orthopnea pnd or leg swelling, presyncope, palpitations, heartburn, abdominal pain, anorexia, nausea, vomiting, diarrhea  or change in bowel or urinary habits, change in stools or urine, dysuria,hematuria,  rash, arthralgias, visual complaints, headache, numbness weakness or ataxia or problems with walking  or coordination,  change in mood/affect or memory.           Objective:   Physical Exam  W/c bm nad / vital signs reviewed - note sats 93% RA in w/c on arrival     06/12/2013   200 >  08/30/2013  189 >186 09/14/2013 > 10/30/2013  184> 01/28/2014  184 > 03/06/14  194 > 06/26/2014  200> 11/29/2014  200> 05/28/2015  212 > 11/10/2015   202 > 02/10/2016  200   HEENT mild turbinate edema.  Oropharynx no thrush or excess pnd or cobblestoning.  No JVD or cervical adenopathy. Mild accessory muscle hypertrophy. Trachea midline, nl thryroid. Chest was hyperinflated by percussion with diminished breath sounds and moderate increased exp time with mid exp wheeze. Hoover sign positive at mid inspiration. Regular rate and rhythm without murmur gallop or rub or increase P2 or edema.  Abd: no hsm, nl excursion. Ext warm without cyanosis or clubbing.        Assessment & Plan:

## 2016-02-11 ENCOUNTER — Other Ambulatory Visit: Payer: Self-pay | Admitting: Pharmacist

## 2016-02-11 ENCOUNTER — Encounter: Payer: Self-pay | Admitting: Internal Medicine

## 2016-02-11 ENCOUNTER — Telehealth: Payer: Self-pay | Admitting: Internal Medicine

## 2016-02-11 ENCOUNTER — Ambulatory Visit (HOSPITAL_COMMUNITY): Payer: Self-pay | Admitting: Psychiatry

## 2016-02-11 NOTE — Assessment & Plan Note (Signed)
Placed on 02 at discharge p THR 03/2013 3h/day and cpap at hs RA sats at rest 94% 06/27/14  so rec use 02 with activity only  - 11/29/2014   Walked RA x one lap @ 185 stopped due to hip pain/ sob with sat 88% > referred for POC  - 05/28/2015 sats ok at rest RA/  Walked on 3lpm  x one lap @ 185 stopped due to sob/ chest tightness but sats ok   As of 02/10/2016 rec2- 3lpm with any activity targeting sats > 90% / and 2lpm hs/ none at rest unless sob at res

## 2016-02-11 NOTE — Telephone Encounter (Signed)
Patient wants to know if he should still be taking the Spiriva with the [facility]. Patient cannot afford Performist either.  Patient states that he brought in some forms for Patient assistance on [facility] but he has not heard anything futher regarding the [facility].  He said that he asked Dr. Melvyn Novas about it at his OV yesterday and Dr. Melvyn Novas was not aware of the status of paperwork.    Magda Paganini - please advise if you have this patient's paperwork and if it has been sent? Dr. Melvyn Novas - please advise on what patient's medications should be?

## 2016-02-11 NOTE — Assessment & Plan Note (Addendum)
-   PFT's 06/12/13  FEV1  1.15 (37%) with ratrio 43 and 14% better p B2 p no rx day of study and DLCO 29% -09/14/2013 med calendar > did not bring to office as requested 10/30/13 or 01/28/14 or 06/27/14  - 01/28/2014 p extensive coaching HFA effectiveness =    75%, 100% with dpi  - PFT's  02/10/2016  FEV1 1.06  (35 % ) ratio 44  p -6 % improvement from saba p prior to study with DLCO  24 % corrects to 37  % for alv volume    - The proper method of use, as well as anticipated side effects, of a metered-dose inhaler are discussed and demonstrated to the patient. Improved effectiveness after extensive coaching during this visit to a level of approximately 75 % from a baseline of 50 %    Reports breathing better despite slt downward trend in fev1 and I strongly suspect deconditioning / anxiety are playing a bigger role in his limitations (always arrives in w/c though says he can exercise x 15 m s stopping).  Main challenge here is keeping him on meds/ med reconciliation (no longer has/uses med calendar   I had an extended discussion with the patient reviewing all relevant studies completed to date and  lasting 15 to 20 minutes of a 25 minute visit on the following ongoing concerns:   Formulary restrictions will be an ongoing challenge for the forseable future and I would be happy to pick an alternative if the pt will first  provide me a list of them but pt  will need to return here for training for any new device that is required eg dpi vs hfa vs respimat.    In meantime we can always provide samples so the patient never runs out of any needed respiratory medications.   Should be able to access meds for new thru DME   To keep things simple, I have asked the patient to first separate medicines that are perceived as maintenance, that is to be taken daily "no matter what", from those medicines that are taken on only on an as-needed basis and I have given the patient examples of both, and then return to see our NP  to generate a  detailed  medication calendar which should be followed until the next physician sees the patient and updates it.    For today>>Each maintenance medication was reviewed in detail including most importantly the difference between maintenance and as needed and under what circumstances the prns are to be used.  Please see instructions for details which were reviewed in writing and the patient given a copy.

## 2016-02-11 NOTE — Patient Outreach (Signed)
Receive a call from Mr. Hogle. Patient is requesting my help in figuring out the status of his paperwork for the patient assistance for [facility] through [employer]. Patient reports that he picked up, completed and returned the application paperwork last month, but has not heard back from Dr. Gustavus Bryant office or [employer] regarding the status yet.   Also reports that he needs clarification on what inhaler maintenance regimen Dr. Melvyn Novas wants for him to follow.   Let [name] know that I will call Dr. Gustavus Bryant office to request clarification on each of these issues and then call him back.  Harlow Asa, PharmD Clinical Pharmacist [facility] [phone]

## 2016-02-11 NOTE — Telephone Encounter (Signed)
Called and spoke with pt and he is aware of how to use the meds that he has now.    He will send his wife after this form and samples(if we have any) and he will return the form with his info to me.

## 2016-02-11 NOTE — Telephone Encounter (Signed)
He did not give me any forms yesterday, sorry

## 2016-02-11 NOTE — Telephone Encounter (Signed)
Elizabeth pharmacist from [facility] requesting a call back - She can be reached at [phone]

## 2016-02-11 NOTE — Telephone Encounter (Signed)
Called and spoke with Benjamine Mola, Pharmacist with [facility]---  She stated that she was calling due to the pt was given perforomist and bedesonide by MW at his appt.  Benjamine Mola stated that this type of medication is very expensive for the pt, only due to the type of insurance that he has and his coverage gap.  I advised Benjamine Mola that we will fill out the pt assistance forms again for the dulera and have MW fill out his part and then have the pt to come up and bring his information and sign these forms so they can be faxed the same day.    She also stated that the pt was very confused about what meds he is to be on at this time from his OV with MW.  I advised that these were printed out on his AVS and was reviewed with the pt before he left the office.  I will review these again with the pt when I call him about the pt assistance forms.    Forms completed and given to Daneil Dan to have MW sign and will return forms to me

## 2016-02-11 NOTE — Patient Outreach (Signed)
Return call to Mr. Sciara. Mr. Revilla reports that he spoke with Truman Hayward. Reports that his wife go and pick up the [facility] patient assistance form again, he will sign this and have her return it to the office. Mr. Buhl reports that Truman Hayward also reviewed with him his medication regimen as directed by Dr. Melvyn Novas.   Remind patient to continue to be sure to follow up with Dr. Melvyn Novas for any increase in signs/symptoms, such as increase in shortness of breath, increase in wheezing, increase in coughing or increased need for rescue inhaler. Patient verbalizes understanding. Patient reports that he has recently received his influenza and pneumonia vaccines.  Mr. Easterlin reports that he has no further questions for me at this time, but confirms that he has my phone number for future concerns.  Harlow Asa, PharmD Clinical Pharmacist [facility] [phone]

## 2016-02-11 NOTE — Telephone Encounter (Signed)
Done

## 2016-02-11 NOTE — Telephone Encounter (Signed)
Forms were already placed for pick up by Leigh. Nothing further is needed

## 2016-02-11 NOTE — Patient Outreach (Signed)
Call to follow up with Dr. Gustavus Bryant office. Speak with Sharyn Lull in Dr. Gustavus Bryant office. Let her know that patient contacted me today requesting my help in figuring out the status of his paperwork for the patient assistance for [facility] through [employer]. Patient reports that he picked up, completed and returned the application paperwork last month, but has not heard back from Dr. Gustavus Bryant office or [employer] regarding the status yet.   Also reports that he needs clarification on what inhaler maintenance regimen Dr. Melvyn Novas wants for him to follow.   Sharyn Lull states that she will look into both of these concerns and then give me a call back today.  Harlow Asa, PharmD Clinical Pharmacist [facility] [phone]

## 2016-02-11 NOTE — Patient Outreach (Signed)
Receive a call back from [facility] in Dr. Gustavus Bryant office. Truman Hayward states that she will follow up with the patient in order to complete the application for [facility] patient assistance again and in order to review his current medications with him.  Harlow Asa, PharmD Clinical Pharmacist [facility] [phone]

## 2016-02-13 DIAGNOSIS — G4733 Obstructive sleep apnea (adult) (pediatric): Secondary | ICD-10-CM | POA: Diagnosis not present

## 2016-02-13 DIAGNOSIS — G4730 Sleep apnea, unspecified: Secondary | ICD-10-CM | POA: Diagnosis not present

## 2016-02-16 ENCOUNTER — Telehealth: Payer: Self-pay | Admitting: Internal Medicine

## 2016-02-16 NOTE — Telephone Encounter (Signed)
Patient assistance forms have been placed in MW's look at. Samples of Spiriva Respimat and Symbicort have been given to the pt's wife. Nothing further was needed at this time.

## 2016-02-23 DIAGNOSIS — J449 Chronic obstructive pulmonary disease, unspecified: Secondary | ICD-10-CM | POA: Diagnosis not present

## 2016-03-09 ENCOUNTER — Encounter: Payer: Self-pay | Admitting: Adult Health

## 2016-03-10 ENCOUNTER — Other Ambulatory Visit: Payer: Self-pay | Admitting: Pharmacist

## 2016-03-10 ENCOUNTER — Telehealth: Payer: Self-pay | Admitting: Internal Medicine

## 2016-03-10 NOTE — Patient Outreach (Signed)
Phone call to Mr. Nipp to follow up about his patient assistance application for [facility]. Left a HIPAA compliant message on the patient's voicemail.  Harlow Asa, PharmD Clinical Pharmacist [facility] [phone]

## 2016-03-10 NOTE — Patient Outreach (Signed)
Speak with [name] who reports that he is currently dealing with having a cracked tooth. Reports that he called his dentist and has an appointment to be seen on Monday. However, reports that the area around the tooth has now become very sensitive and is throbbing. Advise Mr. Droz to call back to the dentist to let the dentist know how his situation has changed and to request that he be seen sooner. Mr. Wassil states that he will call the dentist back now when we get off of the phone. Also provide the patient with the [facility] 24-hour nurse advise line in case anything further comes up overnight.  Mr. Gantt reports that he has not heard back from [employer] or Dr. Gustavus Bryant office regarding his patient assistance application for [facility]. Reports that he will call Dr. Gustavus Bryant office to verify whether the application was sent and then call me back.  Harlow Asa, PharmD Clinical Pharmacist [facility] [phone]

## 2016-03-10 NOTE — Telephone Encounter (Signed)
LMTCB

## 2016-03-11 MED ORDER — TIOTROPIUM BROMIDE MONOHYDRATE 2.5 MCG/ACT IN AERS
2.0000 | INHALATION_SPRAY | Freq: Every morning | RESPIRATORY_TRACT | 0 refills | Status: DC
Start: 2016-03-11 — End: 2018-11-07

## 2016-03-11 MED ORDER — MOMETASONE FURO-FORMOTEROL FUM 200-5 MCG/ACT IN AERO: 2.0000 | INHALATION_SPRAY | Freq: Two times a day (BID) | RESPIRATORY_TRACT | 0 refills | Status: DC

## 2016-03-11 NOTE — Telephone Encounter (Signed)
Pt returned phone called... Contact # [phone].Mearl Latin

## 2016-03-11 NOTE — Telephone Encounter (Signed)
I faxed his pt assistance forms earlier this month  I just sent his scan papers yesterday so they are not scanned in yet  I spoke with the pt and advised I will call [employer] to check on them, since he has yet to do so  Spoke with the rep at [employer] and she states that the forms were received, but were mailed back to [place] due to missing information They are to remail b/c I have not received yet  I have left sample of Dulera in the meantime and also spiriva respimat per his req  Nothing further needed at this time  Will correct the forms once recieved

## 2016-03-15 DIAGNOSIS — G4730 Sleep apnea, unspecified: Secondary | ICD-10-CM | POA: Diagnosis not present

## 2016-03-15 DIAGNOSIS — G4733 Obstructive sleep apnea (adult) (pediatric): Secondary | ICD-10-CM | POA: Diagnosis not present

## 2016-03-18 ENCOUNTER — Encounter (HOSPITAL_COMMUNITY): Payer: Self-pay | Admitting: Psychiatry

## 2016-03-18 ENCOUNTER — Ambulatory Visit (INDEPENDENT_AMBULATORY_CARE_PROVIDER_SITE_OTHER): Payer: 59 | Admitting: Psychiatry

## 2016-03-18 DIAGNOSIS — F330 Major depressive disorder, recurrent, mild: Secondary | ICD-10-CM | POA: Diagnosis not present

## 2016-03-18 DIAGNOSIS — Z79899 Other long term (current) drug therapy: Secondary | ICD-10-CM | POA: Diagnosis not present

## 2016-03-18 MED ORDER — HYDROXYZINE PAMOATE 25 MG PO CAPS: 25.0000 mg | ORAL_CAPSULE | Freq: Every day | ORAL | 0 refills | Status: DC

## 2016-03-18 MED ORDER — MIRTAZAPINE 30 MG PO TABS: 30.0000 mg | ORAL_TABLET | Freq: Every day | ORAL | 1 refills | Status: DC

## 2016-03-18 MED ORDER — HYDROXYZINE PAMOATE 25 MG PO CAPS: 25.0000 mg | ORAL_CAPSULE | Freq: Every day | ORAL | 1 refills | Status: DC

## 2016-03-18 MED ORDER — MIRTAZAPINE 30 MG PO TABS: 30.0000 mg | ORAL_TABLET | Freq: Every day | ORAL | 0 refills | Status: DC

## 2016-03-18 NOTE — Progress Notes (Signed)
[facility] Progress Note  [name] [id number] y.o.  03/18/2016 2:49 PM  Chief Complaint:  Medication management and followup.      History of Present Illness:  [name] came for his followup appointment with his wife.  He had a quite some are.  He likes his current psychiatric medication which is Remeron and Vistaril.  He sleeping good.  Sometime he get anxious and nervous when his oxygen level is low.  He is using oxygen most of the time.  He's been seeing his pulmonologist and primary care physician on a regular basis.  Patient denies any depressive thoughts, irritability, anger, mood swing or any hallucination.  His wife endorsed much improvement in his behavior and impulse control.  His appetite is okay.  He has no concerns or side effects from psychiatric medication.  Patient denies drinking or using any illegal substances.  He uses CPAP machine . His vitals are stable.  Suicidal Ideation: No Plan Formed: No Patient has means to carry out plan: No  Homicidal Ideation: No Plan Formed: No Patient has means to carry out plan: No  Review of Systems: Psychiatric: Agitation: No Hallucination: No Depressed Mood: No Insomnia: No Hypersomnia: No Altered Concentration: No Feels Worthless: No Grandiose Ideas: No Belief In Special Powers: No New/Increased Substance Abuse: No Compulsions: No  Neurologic: Headache: Yes Seizure: No Paresthesias: No  Medical history Patient has history of hypertension, sleep apnea, BPH , COPD, diabetes, hyperlipidemia, coronary artery disease status post stent and arthritis. His primary care physician is [name].  Outpatient Encounter Prescriptions as of 03/18/2016  Medication Sig Dispense Refill  . albuterol (PROAIR HFA) 108 (90 BASE) MCG/ACT inhaler 2 puffs every 4 hours as needed only  if your can't catch your breath 1 Inhaler 11  . amLODipine (NORVASC) 5 MG tablet Take 5 mg by mouth at bedtime.     Marland Kitchen aspirin EC 81 MG  tablet Take 81 mg by mouth at bedtime.    Marland Kitchen atenolol (TENORMIN) 25 MG tablet Take 25 mg by mouth at bedtime.     . budesonide (PULMICORT) 0.25 MG/2ML nebulizer solution 0.25 mg twice daily 120 mL 12  . clopidogrel (PLAVIX) 75 MG tablet Take 75 mg by mouth at bedtime.     . Coenzyme Q10 (COQ10) 100 MG CAPS Take 1 capsule by mouth every morning.     . formoterol (PERFOROMIST) 20 MCG/2ML nebulizer solution Take 2 mLs (20 mcg total) by nebulization 2 (two) times daily. Use in nebulizer twice daily perfectly regularly 120 mL 11  . hydrOXYzine (VISTARIL) 25 MG capsule Take 1 capsule (25 mg total) by mouth at bedtime. 90 capsule 0  . ipratropium-albuterol (DUONEB) 0.5-2.5 (3) MG/3ML SOLN Take 3 mLs by nebulization every 4 (four) hours as needed. 360 mL 11  . metFORMIN (GLUCOPHAGE) 500 MG tablet 2 tabs by mouth once daily at bedtime    . mirtazapine (REMERON) 30 MG tablet Take 1 tablet (30 mg total) by mouth at bedtime. 90 tablet 0  . mometasone-formoterol (DULERA) 200-5 MCG/ACT AERO Inhale 2 puffs into the lungs 2 (two) times daily. 1 Inhaler 0  . Multiple Vitamin (MULTIVITAMIN WITH MINERALS) TABS Take 1 tablet by mouth every morning.     . nitroGLYCERIN (NITROSTAT) 0.4 MG SL tablet Place 0.4 mg under the tongue every 5 (five) minutes as needed for chest pain.    . OXYGEN Inhale 2-3 L into the lungs. With exertion only    . pravastatin (PRAVACHOL) 40 MG  tablet 40 mg.    . sodium chloride (OCEAN) 0.65 % SOLN nasal spray Place 1 spray into both nostrils as needed for congestion.    . tadalafil (CIALIS) 20 MG tablet Take 20 mg by mouth daily as needed. For erectile dysfunction    . Tiotropium Bromide Monohydrate (SPIRIVA RESPIMAT) 2.5 MCG/ACT AERS Inhale 2 puffs into the lungs every morning. 2 Inhaler 0  . vitamin B-12 (CYANOCOBALAMIN) 1000 MCG tablet Take 1,000 mcg by mouth every morning.    . [DISCONTINUED] hydrOXYzine (VISTARIL) 25 MG capsule Take 1 capsule by mouth at  bedtime 90 capsule 0  .  [DISCONTINUED] mirtazapine (REMERON) 30 MG tablet Take 1 tablet by mouth at  bedtime 90 tablet 0   No facility-administered encounter medications on file as of 03/18/2016.     Past Psychiatric History/Hospitalization(s): Patient has history of depression since 16.  He has one suicidal attempt .  In the past he had tried Zoloft Paxil Prozac and Wellbutrin but he stopped due to side effects.  Patient has significant history of using cocaine marijuana and alcohol for 20 years.  He claims to be sober since she's been coming to this office.   Anxiety: Yes Bipolar Disorder: No Depression: No Mania: No Psychosis: No Schizophrenia: No Personality Disorder: No Hospitalization for psychiatric illness: Yes History of Electroconvulsive Shock Therapy: No Prior Suicide Attempts: Yes  Physical Exam: Constitutional:  BP 126/74   Pulse (!) 105   Ht 5\' 10"  (1.778 m)   Wt 202 lb (91.6 kg)   BMI 28.98 kg/m   General Appearance: Patient appears tired, shortness of breath and difficulty walking.    Musculoskeletal: Strength & Muscle Tone: within normal limits Gait & Station: unsteady due to pain  Patient leans: Front Review of Systems  Constitutional: Positive for malaise/fatigue.  Respiratory: Positive for shortness of breath.   Psychiatric/Behavioral: Negative for hallucinations and suicidal ideas.   Mental status examination: Patient is casually dressed and fairly groomed.  He is using wheelchair for ambulation.  He has a oxygen as he has difficulty breathing.  He described his mood Pleasant and his affect is appropriate.  He denies any auditory or visual hallucination.  He denies any active or passive suicidal parts or homicidal thought.  His attention and concentration is fair.  His thought processes slow but logical and goal-directed.  There were no delusions, paranoia or any obsessive thoughts.  His psychomotor activity is slow.  He is alert and oriented 3 .  His cognition is intact.  His  insight judgment and impulse control is okay.  Established Problem, Stable/Improving (1), Review of Last Therapy Session (1) and Review of Medication Regimen & Side Effects (2)  Assessment: Axis I: Maj. depressive disorder, polysubstance dependence in complete remission  Axis II: Deferred  Axis III: See medical history  Plan:  Patient is a stable on Remeron 30 mg at bedtime and Vistaril 25 mg at bedtime.  He has no side effects.  Discussed medication side effects and benefits.  He has enough trazodone which she takes only as needed.  Recommended to call us back if he has any question or any concern.  Follow-up in 6 months.  Daymeon Fischman T., MD 03/18/2016

## 2016-03-22 ENCOUNTER — Encounter: Payer: Self-pay | Admitting: Adult Health

## 2016-03-25 DIAGNOSIS — J449 Chronic obstructive pulmonary disease, unspecified: Secondary | ICD-10-CM | POA: Diagnosis not present

## 2016-04-05 ENCOUNTER — Telehealth: Payer: Self-pay | Admitting: Internal Medicine

## 2016-04-05 NOTE — Telephone Encounter (Signed)
Spoke with patient-he is aware that we do not have any samples of Proair  To help patient at this time. Pt will also call [employer] to see where his application stands and contact our office. Hold in Triage.

## 2016-04-07 ENCOUNTER — Telehealth: Payer: Self-pay

## 2016-04-07 NOTE — Telephone Encounter (Signed)
[name] from [facility] called in this morning wanting to know the status of this pt. Paper work for assistance for his [employer]. She stated that she sees where it is noted that forms were sent to [employer] but as of currently has not been received.   She can be reached at [phone]  Message will be frowarded to [facility] to check on the status of these forms.   Also to note Pt. Would like a call with an update of the status of this.

## 2016-04-12 NOTE — Telephone Encounter (Signed)
Attempted to contact pt. No answer, no option to leave a message. Will try back.  

## 2016-04-13 NOTE — Telephone Encounter (Signed)
Pt returning call.[name] ° °

## 2016-04-13 NOTE — Telephone Encounter (Signed)
Spoke with pt. States that the last he heard the patient assistance program faxed something to [place].  Magda Paganini - have you received anything on this pt?

## 2016-04-14 NOTE — Telephone Encounter (Signed)
Spoke with pt. And he stated he is suppose receive another form in the mail and he will bring it, into the office for Dr. Melvyn Novas to fill out.   Will forward to [place] to look out for

## 2016-04-14 NOTE — Telephone Encounter (Signed)
We don't have it in our "to do" paperwork

## 2016-04-14 NOTE — Telephone Encounter (Signed)
lmtcb x1 for pt. 

## 2016-04-15 NOTE — Telephone Encounter (Signed)
Ok, will hold for forms

## 2016-04-16 ENCOUNTER — Other Ambulatory Visit: Payer: Self-pay | Admitting: Internal Medicine

## 2016-04-16 MED ORDER — ALBUTEROL SULFATE HFA 108 (90 BASE) MCG/ACT IN AERS: INHALATION_SPRAY | RESPIRATORY_TRACT | 0 refills | Status: DC

## 2016-04-19 NOTE — Telephone Encounter (Signed)
No forms received as of today.

## 2016-04-21 NOTE — Telephone Encounter (Signed)
Pt never provided forms and therefore I am closing this encounter

## 2016-04-24 DIAGNOSIS — J449 Chronic obstructive pulmonary disease, unspecified: Secondary | ICD-10-CM | POA: Diagnosis not present

## 2016-04-27 ENCOUNTER — Telehealth: Payer: Self-pay | Admitting: Internal Medicine

## 2016-04-27 NOTE — Telephone Encounter (Signed)
We did not have any samples of the [facility] here. I advised [name] and he states he will bring the form by for Dr. Gustavus Bryant signature.

## 2016-04-28 ENCOUNTER — Telehealth: Payer: Self-pay | Admitting: Internal Medicine

## 2016-04-28 NOTE — Telephone Encounter (Signed)
I have placed this in MW's lookat to be signed

## 2016-04-28 NOTE — Telephone Encounter (Signed)
Patient assistant forms for dulera placed in MW look at [place] and two samples given to pt spouse. Pt wife would like for [place] to call her once this is complete. Pt spouse would Will route to [place] to f/u on.

## 2016-04-28 NOTE — Telephone Encounter (Signed)
done

## 2016-04-29 NOTE — Telephone Encounter (Signed)
Patient wife here to pick up form - not in folder up front - pr

## 2016-04-29 NOTE — Telephone Encounter (Signed)
Forms given to patient wife.  Copy made and placed in MW scan folder. Nothing further needed.

## 2016-05-06 ENCOUNTER — Telehealth: Payer: Self-pay | Admitting: Internal Medicine

## 2016-05-06 NOTE — Telephone Encounter (Signed)
[name] called and stated that there was a sig and license number missing on his forms. He stated he will have his wife bring the form up tomorrow for him to fill out the missing parts.  Say they need it faxed as well once done. Sending to [facility] nurse just so she is aware

## 2016-05-07 NOTE — Telephone Encounter (Signed)
Patient's wife dropped paperwork off. This is in Dr. Gustavus Bryant folder up front. She states this is important that this be done today.

## 2016-05-07 NOTE — Telephone Encounter (Signed)
Forms placed in MW look at. Will route to [place] to f/u on, as pt is requesting this be complete today.

## 2016-05-07 NOTE — Telephone Encounter (Signed)
Still waiting on these forms

## 2016-05-12 NOTE — Telephone Encounter (Signed)
Form is still in Dr Gustavus Bryant lookat waiting to be signed

## 2016-05-12 NOTE — Telephone Encounter (Signed)
Done and faxed  Pt aware and copy up front for pick up per his request I am keeping the original forms for now in case something happens and they don't receive

## 2016-05-12 NOTE — Telephone Encounter (Signed)
Magda Paganini please advise if this message can be closed.  Thanks!

## 2016-05-25 DIAGNOSIS — J449 Chronic obstructive pulmonary disease, unspecified: Secondary | ICD-10-CM | POA: Diagnosis not present

## 2016-05-25 DIAGNOSIS — I2510 Atherosclerotic heart disease of native coronary artery without angina pectoris: Secondary | ICD-10-CM | POA: Diagnosis not present

## 2016-05-25 DIAGNOSIS — G4730 Sleep apnea, unspecified: Secondary | ICD-10-CM | POA: Diagnosis not present

## 2016-05-25 DIAGNOSIS — G4733 Obstructive sleep apnea (adult) (pediatric): Secondary | ICD-10-CM | POA: Diagnosis not present

## 2016-05-25 DIAGNOSIS — I252 Old myocardial infarction: Secondary | ICD-10-CM | POA: Diagnosis not present

## 2016-06-21 ENCOUNTER — Ambulatory Visit: Payer: Self-pay | Admitting: Adult Health

## 2016-06-22 DIAGNOSIS — J441 Chronic obstructive pulmonary disease with (acute) exacerbation: Secondary | ICD-10-CM | POA: Diagnosis not present

## 2016-06-24 ENCOUNTER — Encounter: Payer: Self-pay | Admitting: Adult Health

## 2016-06-24 ENCOUNTER — Ambulatory Visit (INDEPENDENT_AMBULATORY_CARE_PROVIDER_SITE_OTHER): Payer: Medicare Other | Admitting: Adult Health

## 2016-06-24 DIAGNOSIS — J9611 Chronic respiratory failure with hypoxia: Secondary | ICD-10-CM | POA: Diagnosis not present

## 2016-06-24 DIAGNOSIS — J449 Chronic obstructive pulmonary disease, unspecified: Secondary | ICD-10-CM

## 2016-06-24 MED ORDER — ALBUTEROL SULFATE HFA 108 (90 BASE) MCG/ACT IN AERS: INHALATION_SPRAY | RESPIRATORY_TRACT | 1 refills | Status: DC

## 2016-06-24 NOTE — Addendum Note (Signed)
Addended by: Parke Poisson E on: 06/24/2016 05:56 PM   Modules accepted: Orders

## 2016-06-24 NOTE — Patient Instructions (Signed)
Continue on current regimen  Follow med calendar closely and bring to each visit.  Follow up Dr. Wert  In 3 months and As needed   

## 2016-06-24 NOTE — Assessment & Plan Note (Signed)
Compensated on present regimen Patient's medications were reviewed today and patient education was given. Computerized medication calendar was adjusted/completed   Plan  . Patient Instructions  Continue on current regimen  Follow med calendar closely and bring to each visit.  Follow up Dr. Melvyn Novas  In  3 months and As needed

## 2016-06-24 NOTE — Progress Notes (Signed)
@Patient  ID: [name], male    DOB: 08/08/1950, 66 y.o.   MRN: [id number]  Chief Complaint  Patient presents with  . Follow-up    COPD     Referring provider: Lucianne Lei, MD  HPI: 66 year old male former smoker July 2016 followed for gold III COPD O2 dependent respiratory failure  06/24/2016 Follow up : COPD /O2 RF  Patient presents for a three-month follow-up for COPD. Patient says that since last visit. His breathing is doing okay. He's had no flare of cough or wheezing. He remains on Symbicort and Spiriva. Was trying to change over to nebulizers but does not want to change from his inhalers and can patient assistance to Symbicort.  Patient remains on oxygen 2 L at bedtime. Uses with activity. Intermittently. We reviewed all his medications organize them into a medication calendar with patient education. He appears to be take his medications correctly.    No Known Allergies  Immunization History  Administered Date(s) Administered  . Influenza Split 02/14/2013, 02/15/2015  . Influenza, High Dose Seasonal PF 02/10/2016  . Influenza,inj,Quad PF,36+ Mos 01/28/2014  . Influenza-Unspecified 01/16/2015  . Pneumococcal Conjugate-13 02/10/2016  . Pneumococcal-Unspecified 05/17/2013    Past Medical History:  Diagnosis Date  . Anxiety   . Arthritis   . COPD (chronic obstructive pulmonary disease) ([place])   . Coronary artery disease    x4  . Depression   . Diabetes mellitus without complication ([place])   . GERD (gastroesophageal reflux disease)    tums will relieve  . HLD (hyperlipidemia)   . HTN (hypertension)   . Myocardial infarction    x2  . Obstructive sleep apnea    sleep study 2006, uses CPAP, pt does not know cpap settings  . On home oxygen therapy    2L via nasal cannula prn  . Shortness of breath    exertion    Tobacco History: History  Smoking Status  . Former Smoker  . Packs/day: 0.25  . Years: 45.00  . Types: Cigarettes  . Quit date: 11/24/2014    Smokeless Tobacco  . Never Used   Counseling given: Not Answered   Outpatient Encounter Prescriptions as of 06/24/2016  Medication Sig  . albuterol (PROAIR HFA) 108 (90 Base) MCG/ACT inhaler 2 puffs every 4 hours as needed only  if your can't catch your breath  . amLODipine (NORVASC) 5 MG tablet Take 5 mg by mouth at bedtime.   Marland Kitchen aspirin EC 81 MG tablet Take 81 mg by mouth at bedtime.  Marland Kitchen atenolol (TENORMIN) 25 MG tablet Take 25 mg by mouth at bedtime.   . budesonide (PULMICORT) 0.25 MG/2ML nebulizer solution 0.25 mg twice daily  . clopidogrel (PLAVIX) 75 MG tablet Take 75 mg by mouth at bedtime.   . Coenzyme Q10 (COQ10) 100 MG CAPS Take 1 capsule by mouth every morning.   . formoterol (PERFOROMIST) 20 MCG/2ML nebulizer solution Take 2 mLs (20 mcg total) by nebulization 2 (two) times daily. Use in nebulizer twice daily perfectly regularly  . hydrOXYzine (VISTARIL) 25 MG capsule Take 1 capsule (25 mg total) by mouth at bedtime.  Marland Kitchen ipratropium-albuterol (DUONEB) 0.5-2.5 (3) MG/3ML SOLN Take 3 mLs by nebulization every 4 (four) hours as needed.  . metFORMIN (GLUCOPHAGE) 500 MG tablet 2 tabs by mouth once daily at bedtime  . mirtazapine (REMERON) 30 MG tablet Take 1 tablet (30 mg total) by mouth at bedtime.  . mometasone-formoterol (DULERA) 200-5 MCG/ACT AERO Inhale 2 puffs into the lungs 2 (two)  times daily.  . Multiple Vitamin (MULTIVITAMIN WITH MINERALS) TABS Take 1 tablet by mouth every morning.   . nitroGLYCERIN (NITROSTAT) 0.4 MG SL tablet Place 0.4 mg under the tongue every 5 (five) minutes as needed for chest pain.  . OXYGEN Inhale 2-3 L into the lungs. With exertion only  . pravastatin (PRAVACHOL) 40 MG tablet 40 mg.  . sodium chloride (OCEAN) 0.65 % SOLN nasal spray Place 1 spray into both nostrils as needed for congestion.  . tadalafil (CIALIS) 20 MG tablet Take 20 mg by mouth daily as needed. For erectile dysfunction  . Tiotropium Bromide Monohydrate (SPIRIVA RESPIMAT) 2.5 MCG/ACT  AERS Inhale 2 puffs into the lungs every morning.  . vitamin B-12 (CYANOCOBALAMIN) 1000 MCG tablet Take 1,000 mcg by mouth every morning.   No facility-administered encounter medications on file as of 06/24/2016.      Review of Systems  Constitutional:   No  weight loss, night sweats,  Fevers, chills,  +fatigue, or  lassitude.  HEENT:   No headaches,  Difficulty swallowing,  Tooth/dental problems, or  Sore throat,                No sneezing, itching, ear ache, nasal congestion, post nasal drip,   CV:  No chest pain,  Orthopnea, PND, swelling in lower extremities, anasarca, dizziness, palpitations, syncope.   GI  No heartburn, indigestion, abdominal pain, nausea, vomiting, diarrhea, change in bowel habits, loss of appetite, bloody stools.   Resp:  .  No wheezing.  No chest wall deformity  Skin: no rash or lesions.  GU: no dysuria, change in color of urine, no urgency or frequency.  No flank pain, no hematuria   MS:  No joint pain or swelling.  No decreased range of motion.  No back pain.    Physical Exam  BP 124/76 (BP Location: Left Arm, Cuff Size: Normal)   Pulse 89   Ht 5\' 10"  (1.778 m)   Wt 202 lb (91.6 kg)   SpO2 91%   BMI 28.98 kg/m   GEN: A/Ox3; pleasant , NAD , elderly    HEENT:  [place]/AT,  EACs-clear, TMs-wnl, NOSE-clear, THROAT-clear, no lesions, no postnasal drip or exudate noted.   NECK:  Supple w/ fair ROM; no JVD; normal carotid impulses w/o bruits; no thyromegaly or nodules palpated; no lymphadenopathy.    RESP  Decreased BS in bases ,  no accessory muscle use, no dullness to percussion  CARD:  RRR, no m/r/g, no peripheral edema, pulses intact, no cyanosis or clubbing.  GI:   Soft & nt; nml bowel sounds; no organomegaly or masses detected.   Musco: Warm bil, no deformities or joint swelling noted.   Neuro: alert, no focal deficits noted.    Skin: Warm, no lesions or rashes  Psych:  No change in mood or affect. No depression or anxiety.  No memory  loss.  Lab Results:  CBC    Component Value Date/Time   WBC 8.3 05/01/2013 1253   RBC 4.50 05/01/2013 1253   HGB 13.1 05/01/2013 1253   HCT 39.7 05/01/2013 1253   PLT 379.0 05/01/2013 1253   MCV 88.2 05/01/2013 1253   MCH 30.1 04/06/2013 0520   MCHC 33.1 05/01/2013 1253   RDW 15.3 (H) 05/01/2013 1253   LYMPHSABS 2.3 05/01/2013 1253   MONOABS 0.7 05/01/2013 1253   EOSABS 0.7 05/01/2013 1253   BASOSABS 0.1 05/01/2013 1253    BMET    Component Value Date/Time   NA 143 05/01/2013  1253   K 4.1 05/01/2013 1253   CL 110 05/01/2013 1253   CO2 23 05/01/2013 1253   GLUCOSE 120 (H) 05/01/2013 1253   BUN 16 05/01/2013 1253   CREATININE 0.8 05/01/2013 1253   CALCIUM 10.0 05/01/2013 1253   GFRNONAA >90 03/30/2013 1228   GFRAA >90 03/30/2013 1228    BNP No results found for: BNP  ProBNP    Component Value Date/Time   PROBNP 28.0 05/01/2013 1253    Imaging: No results found.   Assessment & Plan:   COPD GOLD III with reversibility  Compensated on present regimen Patient's medications were reviewed today and patient education was given. Computerized medication calendar was adjusted/completed   Plan  . Patient Instructions  Continue on current regimen  Follow med calendar closely and bring to each visit.  Follow up Dr. Melvyn Novas  In  3 months and As needed       Chronic respiratory failure with hypoxia ([place]) Cont on current regimen  Cont on o2      Pinki Rottman, NP 06/24/2016

## 2016-06-24 NOTE — Assessment & Plan Note (Signed)
Cont on current regimen  Cont on o2  

## 2016-06-24 NOTE — Progress Notes (Signed)
Chart and office note reviewed in detail  > agree with a/p as outlined    

## 2016-06-25 DIAGNOSIS — J449 Chronic obstructive pulmonary disease, unspecified: Secondary | ICD-10-CM | POA: Diagnosis not present

## 2016-06-29 NOTE — Addendum Note (Signed)
Addended by: Tyson Dense on: 06/29/2016 10:18 AM   Modules accepted: Orders

## 2016-06-29 NOTE — Addendum Note (Signed)
Addended by: Tyson Dense on: 06/29/2016 10:05 AM   Modules accepted: Orders

## 2016-07-01 ENCOUNTER — Telehealth: Payer: Self-pay | Admitting: Internal Medicine

## 2016-07-01 NOTE — Telephone Encounter (Signed)
lmtcb x1 for pt. 

## 2016-07-02 ENCOUNTER — Telehealth: Payer: Self-pay | Admitting: Adult Health

## 2016-07-02 NOTE — Telephone Encounter (Signed)
Spoke with Fernande Boyden  She is calling to find out TP's collaborating physician  I gave the information needed

## 2016-07-05 NOTE — Telephone Encounter (Signed)
Spoke with pt, who states he thinks this has been handled. I then spoke with Crystal with [facility] who confirmed no further information is needed at this time. Pt Rx has been mailed out and should be received in 2-6 business days. Pt aware & voiced his understanding. Nothing further needed.

## 2016-07-05 NOTE — Telephone Encounter (Signed)
[phone] calling back

## 2016-07-05 NOTE — Telephone Encounter (Signed)
lmtcb X 2 

## 2016-07-13 ENCOUNTER — Ambulatory Visit (INDEPENDENT_AMBULATORY_CARE_PROVIDER_SITE_OTHER): Payer: 59 | Admitting: Psychiatry

## 2016-07-13 ENCOUNTER — Encounter (HOSPITAL_COMMUNITY): Payer: Self-pay | Admitting: Psychiatry

## 2016-07-13 VITALS — BP 128/82 | HR 96 | Ht 70.0 in | Wt 204.0 lb

## 2016-07-13 DIAGNOSIS — Z818 Family history of other mental and behavioral disorders: Secondary | ICD-10-CM

## 2016-07-13 DIAGNOSIS — Z825 Family history of asthma and other chronic lower respiratory diseases: Secondary | ICD-10-CM

## 2016-07-13 DIAGNOSIS — F330 Major depressive disorder, recurrent, mild: Secondary | ICD-10-CM | POA: Diagnosis not present

## 2016-07-13 DIAGNOSIS — Z79899 Other long term (current) drug therapy: Secondary | ICD-10-CM | POA: Diagnosis not present

## 2016-07-13 DIAGNOSIS — Z87891 Personal history of nicotine dependence: Secondary | ICD-10-CM

## 2016-07-13 DIAGNOSIS — Z9889 Other specified postprocedural states: Secondary | ICD-10-CM

## 2016-07-13 DIAGNOSIS — F419 Anxiety disorder, unspecified: Secondary | ICD-10-CM

## 2016-07-13 DIAGNOSIS — Z8249 Family history of ischemic heart disease and other diseases of the circulatory system: Secondary | ICD-10-CM

## 2016-07-13 MED ORDER — TRAZODONE HCL 50 MG PO TABS: 50.0000 mg | ORAL_TABLET | Freq: Every day | ORAL | 1 refills | Status: DC

## 2016-07-13 MED ORDER — BUSPIRONE HCL 5 MG PO TABS: 5.0000 mg | ORAL_TABLET | Freq: Two times a day (BID) | ORAL | 1 refills | Status: DC

## 2016-07-13 MED ORDER — MIRTAZAPINE 30 MG PO TABS: 30.0000 mg | ORAL_TABLET | Freq: Every day | ORAL | 1 refills | Status: DC

## 2016-07-13 NOTE — Progress Notes (Signed)
[facility] MD/PA/NP OP Progress Note  07/13/2016 1:36 PM [name]  MRN:  [id number]  Chief Complaint:  Chief Complaint    Follow-up     Subjective:  I'm feeling anxious and nervous.  I wanted to do exercise but I get anxiety attacks.  HPI: [name] came for his follow-up appointment.  He is complaining of increased anxiety and nervousness.  His pulmonologist told that his lungs are okay and he should do exercise and he tried a few times but he developed anxiety attacks.  His wife also endorse that he gets very anxious when he was told to do exercise.  He sleeping good with Remeron.  He does not feel that he needed trazodone.  Some nights he takes Vistaril.  He denies any crying spells, irritability, anger, paranoia or any feeling of hopelessness or worthlessness.  Patient told since he quit smoking he has increased energy and motivation but did anxiety and nervousness he cannot do exercise.  He is using CPAP machine every night.  He denies drinking alcohol or using any illegal substances.  He lives with his wife who is very supportive.  Patient uses wheelchair because of generalized weakness and neuropathy.  His vital signs are stable.  Visit Diagnosis:    ICD-9-CM ICD-10-CM   1. Major depressive disorder, recurrent episode, mild (HCC) 296.31 F33.0 busPIRone (BUSPAR) 5 MG tablet     mirtazapine (REMERON) 30 MG tablet     traZODone (DESYREL) 50 MG tablet    Past Psychiatric History: Reviewed. Patient has history of depression since 89.  He has one suicidal attempt .  In the past he had tried Zoloft Paxil Prozac and Wellbutrin but he stopped due to side effects.  Patient has significant history of using cocaine marijuana and alcohol for 20 years.  He claims to be sober since she's been coming to this office.    Past Medical History:  Past Medical History:  Diagnosis Date  . Anxiety   . Arthritis   . COPD (chronic obstructive pulmonary disease) ([place])   . Coronary artery disease    x4  .  Depression   . Diabetes mellitus without complication ([place])   . GERD (gastroesophageal reflux disease)    tums will relieve  . HLD (hyperlipidemia)   . HTN (hypertension)   . Myocardial infarction    x2  . Obstructive sleep apnea    sleep study 2006, uses CPAP, pt does not know cpap settings  . On home oxygen therapy    2L via nasal cannula prn  . Shortness of breath    exertion    Past Surgical History:  Procedure Laterality Date  . CARDIAC CATHETERIZATION  2003,2010   x4   . COLONOSCOPY WITH PROPOFOL N/A 08/30/2014   Procedure: COLONOSCOPY WITH PROPOFOL;  Surgeon: Carol Ada, MD;  Location: [place];  Service: Endoscopy;  Laterality: N/A;  h&p in file   . CORONARY ANGIOPLASTY     stents x 4  . DENTAL SURGERY    . TOTAL HIP ARTHROPLASTY  04/06/2012   Procedure: TOTAL HIP ARTHROPLASTY;  Surgeon: Sharmon Revere, MD;  Location: [facility];  Service: Orthopedics;  Laterality: Right;  . TOTAL HIP ARTHROPLASTY Left 04/03/2013   Procedure: LEFT TOTAL HIP ARTHROPLASTY;  Surgeon: Sharmon Revere, MD;  Location: [facility];  Service: Orthopedics;  Laterality: Left;    Family Psychiatric History: Reviewed.  Family History:  Family History  Problem Relation Age of Onset  . Depression Mother   . Heart  disease Father   . Emphysema Father     smoked    Social History:  Social History   Social History  . Marital status: Married    Spouse name: N/A  . Number of children: N/A  . Years of education: N/A   Social History Main Topics  . Smoking status: Former Smoker    Packs/day: 0.25    Years: 45.00    Types: Cigarettes    Quit date: 11/24/2014  . Smokeless tobacco: Never Used  . Alcohol use No     Comment: Reports drinks a beer maybe 4 times a year.  . Drug use: No  . Sexual activity: Yes    Partners: Female    Birth control/ protection: None   Other Topics Concern  . None   Social History Narrative  . None    Allergies: No Known Allergies  Metabolic Disorder  Labs: Lab Results  Component Value Date   HGBA1C  06/22/2008    5.7 (NOTE)   The ADA recommends the following therapeutic goal for glycemic   control related to Hgb A1C measurement:   Goal of Therapy:   < 7.0% Hgb A1C   Reference: American Diabetes Association: Clinical Practice   Recommendations 2008, Diabetes Care,  2008, 31:(Suppl 1).   MPG 117 06/22/2008   No results found for: PROLACTIN Lab Results  Component Value Date   CHOL  06/23/2008    174        ATP III CLASSIFICATION:  <200     mg/dL   Desirable  200-239  mg/dL   Borderline High  >=240    mg/dL   High          TRIG 41 06/23/2008   HDL 46 06/23/2008   CHOLHDL 3.8 06/23/2008   VLDL 8 06/23/2008   LDLCALC (H) 06/23/2008    120        Total Cholesterol/HDL:CHD Risk Coronary Heart Disease Risk Table                     Men   Women  1/2 Average Risk   3.4   3.3  Average Risk       5.0   4.4  2 X Average Risk   9.6   7.1  3 X Average Risk  23.4   11.0        Use the calculated Patient Ratio above and the CHD Risk Table to determine the patient's CHD Risk.        ATP III CLASSIFICATION (LDL):  <100     mg/dL   Optimal  100-129  mg/dL   Near or Above                    Optimal  130-159  mg/dL   Borderline  160-189  mg/dL   High  >190     mg/dL   Very High     Current Medications: Current Outpatient Prescriptions  Medication Sig Dispense Refill  . albuterol (PROAIR HFA) 108 (90 Base) MCG/ACT inhaler 2 puffs every 4 hours as needed only  if your can't catch your breath 3 Inhaler 1  . amLODipine (NORVASC) 5 MG tablet Take 5 mg by mouth at bedtime.     Marland Kitchen aspirin EC 81 MG tablet Take 81 mg by mouth at bedtime.    Marland Kitchen atenolol (TENORMIN) 25 MG tablet Take 25 mg by mouth at bedtime.     . budesonide-formoterol (SYMBICORT) 160-4.5 MCG/ACT inhaler  Inhale 2 puffs into the lungs 2 (two) times daily.    . clopidogrel (PLAVIX) 75 MG tablet Take 75 mg by mouth at bedtime.     . Coenzyme Q10 (COQ10) 100 MG CAPS Take 1  capsule by mouth at bedtime.     Marland Kitchen dextromethorphan-guaiFENesin (MUCINEX DM) 30-600 MG 12hr tablet Take 1 tablet by mouth 2 (two) times daily.    . hydrOXYzine (ATARAX/VISTARIL) 25 MG tablet Take 25 mg by mouth at bedtime.    . hydrOXYzine (VISTARIL) 25 MG capsule Take 1 capsule (25 mg total) by mouth at bedtime. 90 capsule 1  . metFORMIN (GLUCOPHAGE) 500 MG tablet 2 tabs by mouth once daily at bedtime    . mirtazapine (REMERON) 30 MG tablet Take 1 tablet (30 mg total) by mouth at bedtime. 90 tablet 1  . Multiple Vitamin (MULTIVITAMIN WITH MINERALS) TABS Take 1 tablet by mouth at bedtime.     . naproxen (NAPROSYN) 500 MG tablet Take 500 mg by mouth 2 (two) times daily as needed.    . nitroGLYCERIN (NITROSTAT) 0.4 MG SL tablet Place 0.4 mg under the tongue every 5 (five) minutes as needed for chest pain.    . OXYGEN Inhale 2-3 L into the lungs. With exertion only    . pravastatin (PRAVACHOL) 40 MG tablet Take 40 mg by mouth at bedtime.     . sildenafil (VIAGRA) 25 MG tablet Take 25 mg by mouth See admin instructions. Use as directed    . sodium chloride (OCEAN) 0.65 % SOLN nasal spray Place 1 spray into both nostrils as needed for congestion.    . Tiotropium Bromide Monohydrate (SPIRIVA RESPIMAT) 2.5 MCG/ACT AERS Inhale 2 puffs into the lungs every morning. 2 Inhaler 0   No current facility-administered medications for this visit.     Neurologic: Headache: No Seizure: No Paresthesias: Yes  Musculoskeletal: Strength & Muscle Tone: decreased Gait & Station: unsteady Patient leans: Front and Backward  Psychiatric Specialty Exam: Review of Systems  Constitutional: Positive for malaise/fatigue.  HENT: Negative.   Skin: Negative.   Neurological: Positive for tingling.  Psychiatric/Behavioral: The patient is nervous/anxious.     Blood pressure 128/82, pulse 96, height 5\' 10"  (1.778 m), weight 204 lb (92.5 kg).Body mass index is 29.27 kg/m.  General Appearance: Casual  Eye Contact:   Fair  Speech:  Clear and Coherent and Slow  Volume:  Decreased  Mood:  Anxious and Depressed  Affect:  Constricted  Thought Process:  Goal Directed  Orientation:  Full (Time, Place, and Person)  Thought Content: WDL, Logical and Rumination   Suicidal Thoughts:  No  Homicidal Thoughts:  No  Memory:  Immediate;   Fair Recent;   Fair Remote;   Fair  Judgement:  Good  Insight:  Good  Psychomotor Activity:  Normal  Concentration:  Concentration: Fair and Attention Span: Fair  Recall:  [employer] of Knowledge: Good  Language: Good  Akathisia:  No  Handed:  Right  AIMS (if indicated):  0  Assets:  Communication Skills Desire for Improvement Housing  ADL's:  Intact  Cognition: WNL  Sleep:  fair   Assessment: Major depressive disorder, recurrent.  Anxiety disorder NOS  Plan: I review his records, recent blood work results, collateral information from his pulmonary physician and his current medication.  He is taking Remeron which is helping his sleep and depression but he continues to have anxiety and nervousness.  He endorses also level at slow when he gets nervous and anxious.  Patient uses wheelchair.  I recommended to try BuSpar 5 mg twice a day to help anxiety and nervousness.  Discontinue Vistaril and take trazodone 50 mg half to one tablet for insomnia.  He will continue Remeron 30 mg at bedtime.  Discussed medication side effects and benefits.  Recommended to call us back if he has any question, concern if he feel worsening of the symptom.  Follow-up in 2 months.     Pauline Pegues T., MD 07/13/2016, 1:36 PM

## 2016-07-14 DIAGNOSIS — J449 Chronic obstructive pulmonary disease, unspecified: Secondary | ICD-10-CM | POA: Diagnosis not present

## 2016-07-19 ENCOUNTER — Other Ambulatory Visit (HOSPITAL_COMMUNITY): Payer: Self-pay

## 2016-07-19 DIAGNOSIS — F330 Major depressive disorder, recurrent, mild: Secondary | ICD-10-CM

## 2016-07-19 MED ORDER — BUSPIRONE HCL 5 MG PO TABS: 5.0000 mg | ORAL_TABLET | Freq: Two times a day (BID) | ORAL | 0 refills | Status: DC

## 2016-07-21 DIAGNOSIS — G4733 Obstructive sleep apnea (adult) (pediatric): Secondary | ICD-10-CM | POA: Diagnosis not present

## 2016-07-21 DIAGNOSIS — G4730 Sleep apnea, unspecified: Secondary | ICD-10-CM | POA: Diagnosis not present

## 2016-07-22 DIAGNOSIS — H2513 Age-related nuclear cataract, bilateral: Secondary | ICD-10-CM | POA: Diagnosis not present

## 2016-07-22 DIAGNOSIS — H31019 Macula scars of posterior pole (postinflammatory) (post-traumatic), unspecified eye: Secondary | ICD-10-CM | POA: Diagnosis not present

## 2016-07-22 DIAGNOSIS — E119 Type 2 diabetes mellitus without complications: Secondary | ICD-10-CM | POA: Diagnosis not present

## 2016-07-22 DIAGNOSIS — H31011 Macula scars of posterior pole (postinflammatory) (post-traumatic), right eye: Secondary | ICD-10-CM | POA: Diagnosis not present

## 2016-07-22 DIAGNOSIS — H4720 Unspecified optic atrophy: Secondary | ICD-10-CM | POA: Diagnosis not present

## 2016-07-23 DIAGNOSIS — J449 Chronic obstructive pulmonary disease, unspecified: Secondary | ICD-10-CM | POA: Diagnosis not present

## 2016-07-26 ENCOUNTER — Other Ambulatory Visit (HOSPITAL_COMMUNITY): Payer: Self-pay | Admitting: Psychiatry

## 2016-07-26 DIAGNOSIS — F330 Major depressive disorder, recurrent, mild: Secondary | ICD-10-CM

## 2016-07-28 ENCOUNTER — Telehealth (HOSPITAL_COMMUNITY): Payer: Self-pay

## 2016-07-28 ENCOUNTER — Telehealth: Payer: Self-pay | Admitting: Internal Medicine

## 2016-07-28 ENCOUNTER — Other Ambulatory Visit (HOSPITAL_COMMUNITY): Payer: Self-pay | Admitting: Psychiatry

## 2016-07-28 DIAGNOSIS — M130 Polyarthritis, unspecified: Secondary | ICD-10-CM | POA: Diagnosis not present

## 2016-07-28 DIAGNOSIS — E119 Type 2 diabetes mellitus without complications: Secondary | ICD-10-CM | POA: Diagnosis not present

## 2016-07-28 DIAGNOSIS — I10 Essential (primary) hypertension: Secondary | ICD-10-CM | POA: Diagnosis not present

## 2016-07-28 NOTE — Telephone Encounter (Signed)
Patient said his PCP wanted him to call because his heart rate was up today to 116. He said she wanted to have him check with you to see if any of his medications could be doing this.He said you recently added Buspar and Trazodone, he has only been taking the Buspar and is wondering if he should start back on the Trazodone. Please review and advise, thank you

## 2016-07-28 NOTE — Telephone Encounter (Signed)
Hydroxyzine was discontinued.  Remeron was recently given.  Too soon to refill

## 2016-07-28 NOTE — Telephone Encounter (Signed)
Spoke with patient-states he noted high HR today at PCP office of 116 and was told to contact our office to see if [facility] inhaler could be causing this. Pt has been on Dulera since 06/2016-was switched from Symbicort. Pt also notes that his O2 levels have been much lower since being on Dulera(gets 88-92% RA) than on Symbicort (91-95%RA). Would like recs from MW.   Please advise. Thanks.

## 2016-07-28 NOTE — Telephone Encounter (Signed)
Called and spoke to pt. Informed him of the recs per MW. Pt verbalized understanding. Pt states he hasnt been taking the albuterol often (its occasional use) and states he would still like to change to Symbicort but would like to take a sample prior to buying the rx, pt states he will call back next week to see if we have Symbicort 160. Pt aware to start taking the Tenormin BID. Nothing further needed at this time.

## 2016-07-28 NOTE — Telephone Encounter (Signed)
Fine with me to change back to symbicort 160 but most likely this is due to either too much use of albuterol or too low a dose of tenormin  If not using albutrol then it would be the tenormin more than likely and should try the 25 bid instdead of daily

## 2016-07-29 ENCOUNTER — Other Ambulatory Visit (HOSPITAL_COMMUNITY): Payer: Self-pay | Admitting: Psychiatry

## 2016-07-29 NOTE — Telephone Encounter (Signed)
I called patient and let him know what you said, he voiced his understanding. Patient will call me next week to let me know how it goes

## 2016-07-29 NOTE — Telephone Encounter (Signed)
He should stop BuSpar and to see if heart rate dropped to normal.  If heart rate remains high then BuSpar is not causing increased heart rate and he should remain on it.  For now don't take trazodone until heart rate stabilize.

## 2016-08-04 ENCOUNTER — Telehealth: Payer: Self-pay | Admitting: Internal Medicine

## 2016-08-04 NOTE — Telephone Encounter (Signed)
Called and spoke to pt. Pt is requesting a Symbicort sample. This has been left up front for pick up. Pt verbalized understanding and denied any further questions or concerns at this time.

## 2016-08-09 ENCOUNTER — Telehealth (HOSPITAL_COMMUNITY): Payer: Self-pay

## 2016-08-09 NOTE — Telephone Encounter (Signed)
Called patient and let him know to start taking 1 1/2 tablets of Remeron at bedtime. Patient was agreeable to the plan and will call me next week to let me know how he is doing.

## 2016-08-09 NOTE — Telephone Encounter (Signed)
He can try increasing Remeron to 45 mg at night.  We can also help his anxiety.

## 2016-08-09 NOTE — Telephone Encounter (Signed)
Patient is calling back to let you know his heart rate has stabilized since stopping the Buspar, he would like to know what to do about his anxiety if he can no longer take the Buspar. Please review and advise, thank you

## 2016-08-11 DIAGNOSIS — J449 Chronic obstructive pulmonary disease, unspecified: Secondary | ICD-10-CM | POA: Diagnosis not present

## 2016-08-23 ENCOUNTER — Encounter (HOSPITAL_COMMUNITY)
Admission: RE | Admit: 2016-08-23 | Discharge: 2016-08-23 | Disposition: A | Discharge status: Home / Self Care | Payer: Medicare Other | Source: Ambulatory Visit | Attending: Family Medicine | Admitting: Family Medicine

## 2016-08-23 ENCOUNTER — Telehealth (HOSPITAL_COMMUNITY): Payer: Self-pay

## 2016-08-23 DIAGNOSIS — J449 Chronic obstructive pulmonary disease, unspecified: Secondary | ICD-10-CM | POA: Diagnosis not present

## 2016-08-23 NOTE — Telephone Encounter (Signed)
Patient was referred to Pulmonary rehab, the nurse called and said that he is currently not appropriate for the group exercise program due to increased anxiety. When patient tried his B/P and heart rate shot up. The nurse Truddie Crumble) states he does need this program, but can not participate until he can get his anxiety under control. Please review and advise, thank you

## 2016-08-24 NOTE — Telephone Encounter (Signed)
Per Dr. Adele Schilder I called the patient and advised him to restart his Buspar 5 mg bid. Patient was agreeable to this and will start today.

## 2016-09-06 ENCOUNTER — Telehealth: Payer: Self-pay | Admitting: Internal Medicine

## 2016-09-06 MED ORDER — BUDESONIDE-FORMOTEROL FUMARATE 160-4.5 MCG/ACT IN AERO: 2.0000 | INHALATION_SPRAY | Freq: Two times a day (BID) | RESPIRATORY_TRACT | 0 refills | Status: DC

## 2016-09-06 NOTE — Telephone Encounter (Signed)
Spoke with the pt and advised 2 samples of symbicort left up front for pick up  Nothing further needed

## 2016-09-09 ENCOUNTER — Ambulatory Visit (HOSPITAL_COMMUNITY): Payer: 59 | Admitting: Psychiatry

## 2016-09-09 ENCOUNTER — Ambulatory Visit (HOSPITAL_COMMUNITY): Payer: Self-pay | Admitting: Psychiatry

## 2016-09-14 DIAGNOSIS — Z96643 Presence of artificial hip joint, bilateral: Secondary | ICD-10-CM | POA: Diagnosis not present

## 2016-09-14 DIAGNOSIS — J9611 Chronic respiratory failure with hypoxia: Secondary | ICD-10-CM | POA: Diagnosis not present

## 2016-09-14 DIAGNOSIS — I2510 Atherosclerotic heart disease of native coronary artery without angina pectoris: Secondary | ICD-10-CM | POA: Diagnosis not present

## 2016-09-14 DIAGNOSIS — J449 Chronic obstructive pulmonary disease, unspecified: Secondary | ICD-10-CM | POA: Diagnosis not present

## 2016-09-14 DIAGNOSIS — I119 Hypertensive heart disease without heart failure: Secondary | ICD-10-CM | POA: Diagnosis not present

## 2016-09-14 DIAGNOSIS — M069 Rheumatoid arthritis, unspecified: Secondary | ICD-10-CM | POA: Diagnosis not present

## 2016-09-14 DIAGNOSIS — Z951 Presence of aortocoronary bypass graft: Secondary | ICD-10-CM | POA: Diagnosis not present

## 2016-09-14 DIAGNOSIS — E119 Type 2 diabetes mellitus without complications: Secondary | ICD-10-CM | POA: Diagnosis not present

## 2016-09-16 ENCOUNTER — Ambulatory Visit (HOSPITAL_COMMUNITY): Payer: Self-pay | Admitting: Psychiatry

## 2016-09-16 DIAGNOSIS — E119 Type 2 diabetes mellitus without complications: Secondary | ICD-10-CM | POA: Diagnosis not present

## 2016-09-16 DIAGNOSIS — I10 Essential (primary) hypertension: Secondary | ICD-10-CM | POA: Diagnosis not present

## 2016-09-17 ENCOUNTER — Other Ambulatory Visit (HOSPITAL_COMMUNITY)
Admission: RE | Admit: 2016-09-17 | Discharge: 2016-09-17 | Disposition: A | Discharge status: Home / Self Care | Payer: Medicare Other | Source: Ambulatory Visit | Attending: Family Medicine | Admitting: Family Medicine

## 2016-09-17 ENCOUNTER — Other Ambulatory Visit: Payer: Self-pay | Admitting: Family Medicine

## 2016-09-17 DIAGNOSIS — Z113 Encounter for screening for infections with a predominantly sexual mode of transmission: Secondary | ICD-10-CM | POA: Diagnosis present

## 2016-09-21 ENCOUNTER — Ambulatory Visit: Payer: Self-pay | Admitting: Internal Medicine

## 2016-09-22 DIAGNOSIS — J449 Chronic obstructive pulmonary disease, unspecified: Secondary | ICD-10-CM | POA: Diagnosis not present

## 2016-09-23 ENCOUNTER — Ambulatory Visit (HOSPITAL_COMMUNITY): Payer: 59 | Admitting: Psychiatry

## 2016-09-23 DIAGNOSIS — M069 Rheumatoid arthritis, unspecified: Secondary | ICD-10-CM | POA: Diagnosis not present

## 2016-09-23 DIAGNOSIS — Z951 Presence of aortocoronary bypass graft: Secondary | ICD-10-CM | POA: Diagnosis not present

## 2016-09-23 DIAGNOSIS — J449 Chronic obstructive pulmonary disease, unspecified: Secondary | ICD-10-CM | POA: Diagnosis not present

## 2016-09-23 DIAGNOSIS — J9611 Chronic respiratory failure with hypoxia: Secondary | ICD-10-CM | POA: Diagnosis not present

## 2016-09-23 DIAGNOSIS — E119 Type 2 diabetes mellitus without complications: Secondary | ICD-10-CM | POA: Diagnosis not present

## 2016-09-23 DIAGNOSIS — I119 Hypertensive heart disease without heart failure: Secondary | ICD-10-CM | POA: Diagnosis not present

## 2016-09-23 DIAGNOSIS — Z96643 Presence of artificial hip joint, bilateral: Secondary | ICD-10-CM | POA: Diagnosis not present

## 2016-09-23 DIAGNOSIS — I2510 Atherosclerotic heart disease of native coronary artery without angina pectoris: Secondary | ICD-10-CM | POA: Diagnosis not present

## 2016-09-23 LAB — URINE CYTOLOGY ANCILLARY ONLY
BACTERIAL VAGINITIS: NEGATIVE
CANDIDA VAGINITIS: NEGATIVE
Chlamydia: NEGATIVE
NEISSERIA GONORRHEA: NEGATIVE
Trichomonas: NEGATIVE

## 2016-09-28 ENCOUNTER — Other Ambulatory Visit (HOSPITAL_COMMUNITY): Payer: Self-pay

## 2016-09-28 DIAGNOSIS — I2510 Atherosclerotic heart disease of native coronary artery without angina pectoris: Secondary | ICD-10-CM | POA: Diagnosis not present

## 2016-09-28 DIAGNOSIS — I119 Hypertensive heart disease without heart failure: Secondary | ICD-10-CM | POA: Diagnosis not present

## 2016-09-28 DIAGNOSIS — Z96643 Presence of artificial hip joint, bilateral: Secondary | ICD-10-CM | POA: Diagnosis not present

## 2016-09-28 DIAGNOSIS — E119 Type 2 diabetes mellitus without complications: Secondary | ICD-10-CM | POA: Diagnosis not present

## 2016-09-28 DIAGNOSIS — M069 Rheumatoid arthritis, unspecified: Secondary | ICD-10-CM | POA: Diagnosis not present

## 2016-09-28 DIAGNOSIS — F330 Major depressive disorder, recurrent, mild: Secondary | ICD-10-CM

## 2016-09-28 DIAGNOSIS — J9611 Chronic respiratory failure with hypoxia: Secondary | ICD-10-CM | POA: Diagnosis not present

## 2016-09-28 DIAGNOSIS — J449 Chronic obstructive pulmonary disease, unspecified: Secondary | ICD-10-CM | POA: Diagnosis not present

## 2016-09-28 DIAGNOSIS — Z951 Presence of aortocoronary bypass graft: Secondary | ICD-10-CM | POA: Diagnosis not present

## 2016-09-28 MED ORDER — MIRTAZAPINE 30 MG PO TABS: 45.0000 mg | ORAL_TABLET | Freq: Every day | ORAL | 0 refills | Status: DC

## 2016-09-28 NOTE — Progress Notes (Unsigned)
Patient's insurance

## 2016-09-29 ENCOUNTER — Ambulatory Visit (HOSPITAL_COMMUNITY): Payer: Self-pay | Admitting: Psychiatry

## 2016-09-30 DIAGNOSIS — J9611 Chronic respiratory failure with hypoxia: Secondary | ICD-10-CM | POA: Diagnosis not present

## 2016-09-30 DIAGNOSIS — Z96643 Presence of artificial hip joint, bilateral: Secondary | ICD-10-CM | POA: Diagnosis not present

## 2016-09-30 DIAGNOSIS — Z951 Presence of aortocoronary bypass graft: Secondary | ICD-10-CM | POA: Diagnosis not present

## 2016-09-30 DIAGNOSIS — I2510 Atherosclerotic heart disease of native coronary artery without angina pectoris: Secondary | ICD-10-CM | POA: Diagnosis not present

## 2016-09-30 DIAGNOSIS — M069 Rheumatoid arthritis, unspecified: Secondary | ICD-10-CM | POA: Diagnosis not present

## 2016-09-30 DIAGNOSIS — E119 Type 2 diabetes mellitus without complications: Secondary | ICD-10-CM | POA: Diagnosis not present

## 2016-09-30 DIAGNOSIS — I119 Hypertensive heart disease without heart failure: Secondary | ICD-10-CM | POA: Diagnosis not present

## 2016-09-30 DIAGNOSIS — J449 Chronic obstructive pulmonary disease, unspecified: Secondary | ICD-10-CM | POA: Diagnosis not present

## 2016-10-04 ENCOUNTER — Ambulatory Visit: Payer: Self-pay | Admitting: Internal Medicine

## 2016-10-07 DIAGNOSIS — E119 Type 2 diabetes mellitus without complications: Secondary | ICD-10-CM | POA: Diagnosis not present

## 2016-10-07 DIAGNOSIS — I2510 Atherosclerotic heart disease of native coronary artery without angina pectoris: Secondary | ICD-10-CM | POA: Diagnosis not present

## 2016-10-07 DIAGNOSIS — Z951 Presence of aortocoronary bypass graft: Secondary | ICD-10-CM | POA: Diagnosis not present

## 2016-10-07 DIAGNOSIS — J9611 Chronic respiratory failure with hypoxia: Secondary | ICD-10-CM | POA: Diagnosis not present

## 2016-10-07 DIAGNOSIS — M069 Rheumatoid arthritis, unspecified: Secondary | ICD-10-CM | POA: Diagnosis not present

## 2016-10-07 DIAGNOSIS — Z96643 Presence of artificial hip joint, bilateral: Secondary | ICD-10-CM | POA: Diagnosis not present

## 2016-10-07 DIAGNOSIS — I119 Hypertensive heart disease without heart failure: Secondary | ICD-10-CM | POA: Diagnosis not present

## 2016-10-07 DIAGNOSIS — J449 Chronic obstructive pulmonary disease, unspecified: Secondary | ICD-10-CM | POA: Diagnosis not present

## 2016-10-08 DIAGNOSIS — I10 Essential (primary) hypertension: Secondary | ICD-10-CM | POA: Diagnosis not present

## 2016-10-08 DIAGNOSIS — E119 Type 2 diabetes mellitus without complications: Secondary | ICD-10-CM | POA: Diagnosis not present

## 2016-10-12 DIAGNOSIS — E119 Type 2 diabetes mellitus without complications: Secondary | ICD-10-CM | POA: Diagnosis not present

## 2016-10-12 DIAGNOSIS — Z96643 Presence of artificial hip joint, bilateral: Secondary | ICD-10-CM | POA: Diagnosis not present

## 2016-10-12 DIAGNOSIS — I119 Hypertensive heart disease without heart failure: Secondary | ICD-10-CM | POA: Diagnosis not present

## 2016-10-12 DIAGNOSIS — J449 Chronic obstructive pulmonary disease, unspecified: Secondary | ICD-10-CM | POA: Diagnosis not present

## 2016-10-12 DIAGNOSIS — J9611 Chronic respiratory failure with hypoxia: Secondary | ICD-10-CM | POA: Diagnosis not present

## 2016-10-12 DIAGNOSIS — Z951 Presence of aortocoronary bypass graft: Secondary | ICD-10-CM | POA: Diagnosis not present

## 2016-10-12 DIAGNOSIS — M069 Rheumatoid arthritis, unspecified: Secondary | ICD-10-CM | POA: Diagnosis not present

## 2016-10-12 DIAGNOSIS — I2510 Atherosclerotic heart disease of native coronary artery without angina pectoris: Secondary | ICD-10-CM | POA: Diagnosis not present

## 2016-10-13 ENCOUNTER — Ambulatory Visit (HOSPITAL_COMMUNITY): Payer: Self-pay | Admitting: Psychiatry

## 2016-10-14 DIAGNOSIS — M069 Rheumatoid arthritis, unspecified: Secondary | ICD-10-CM | POA: Diagnosis not present

## 2016-10-14 DIAGNOSIS — J9611 Chronic respiratory failure with hypoxia: Secondary | ICD-10-CM | POA: Diagnosis not present

## 2016-10-14 DIAGNOSIS — Z951 Presence of aortocoronary bypass graft: Secondary | ICD-10-CM | POA: Diagnosis not present

## 2016-10-14 DIAGNOSIS — Z96643 Presence of artificial hip joint, bilateral: Secondary | ICD-10-CM | POA: Diagnosis not present

## 2016-10-14 DIAGNOSIS — I119 Hypertensive heart disease without heart failure: Secondary | ICD-10-CM | POA: Diagnosis not present

## 2016-10-14 DIAGNOSIS — J449 Chronic obstructive pulmonary disease, unspecified: Secondary | ICD-10-CM | POA: Diagnosis not present

## 2016-10-14 DIAGNOSIS — E119 Type 2 diabetes mellitus without complications: Secondary | ICD-10-CM | POA: Diagnosis not present

## 2016-10-14 DIAGNOSIS — I2510 Atherosclerotic heart disease of native coronary artery without angina pectoris: Secondary | ICD-10-CM | POA: Diagnosis not present

## 2016-10-21 DIAGNOSIS — G4730 Sleep apnea, unspecified: Secondary | ICD-10-CM | POA: Diagnosis not present

## 2016-10-21 DIAGNOSIS — G4733 Obstructive sleep apnea (adult) (pediatric): Secondary | ICD-10-CM | POA: Diagnosis not present

## 2016-10-23 DIAGNOSIS — J449 Chronic obstructive pulmonary disease, unspecified: Secondary | ICD-10-CM | POA: Diagnosis not present

## 2016-10-27 ENCOUNTER — Ambulatory Visit (HOSPITAL_COMMUNITY): Payer: Self-pay | Admitting: Psychiatry

## 2016-11-02 ENCOUNTER — Ambulatory Visit (HOSPITAL_COMMUNITY): Payer: Self-pay | Admitting: Psychiatry

## 2016-11-04 ENCOUNTER — Ambulatory Visit (HOSPITAL_COMMUNITY): Payer: Self-pay | Admitting: Psychiatry

## 2016-11-04 DIAGNOSIS — Z96643 Presence of artificial hip joint, bilateral: Secondary | ICD-10-CM | POA: Diagnosis not present

## 2016-11-04 DIAGNOSIS — J9611 Chronic respiratory failure with hypoxia: Secondary | ICD-10-CM | POA: Diagnosis not present

## 2016-11-04 DIAGNOSIS — M069 Rheumatoid arthritis, unspecified: Secondary | ICD-10-CM | POA: Diagnosis not present

## 2016-11-04 DIAGNOSIS — E119 Type 2 diabetes mellitus without complications: Secondary | ICD-10-CM | POA: Diagnosis not present

## 2016-11-04 DIAGNOSIS — I2510 Atherosclerotic heart disease of native coronary artery without angina pectoris: Secondary | ICD-10-CM | POA: Diagnosis not present

## 2016-11-04 DIAGNOSIS — J449 Chronic obstructive pulmonary disease, unspecified: Secondary | ICD-10-CM | POA: Diagnosis not present

## 2016-11-04 DIAGNOSIS — Z951 Presence of aortocoronary bypass graft: Secondary | ICD-10-CM | POA: Diagnosis not present

## 2016-11-04 DIAGNOSIS — I119 Hypertensive heart disease without heart failure: Secondary | ICD-10-CM | POA: Diagnosis not present

## 2016-11-05 ENCOUNTER — Ambulatory Visit: Payer: Self-pay | Admitting: Internal Medicine

## 2016-11-09 DIAGNOSIS — H31091 Other chorioretinal scars, right eye: Secondary | ICD-10-CM | POA: Diagnosis not present

## 2016-11-09 DIAGNOSIS — H2513 Age-related nuclear cataract, bilateral: Secondary | ICD-10-CM | POA: Diagnosis not present

## 2016-11-09 DIAGNOSIS — E119 Type 2 diabetes mellitus without complications: Secondary | ICD-10-CM | POA: Diagnosis not present

## 2016-11-11 DIAGNOSIS — Z951 Presence of aortocoronary bypass graft: Secondary | ICD-10-CM | POA: Diagnosis not present

## 2016-11-11 DIAGNOSIS — J441 Chronic obstructive pulmonary disease with (acute) exacerbation: Secondary | ICD-10-CM | POA: Diagnosis not present

## 2016-11-11 DIAGNOSIS — I10 Essential (primary) hypertension: Secondary | ICD-10-CM | POA: Diagnosis not present

## 2016-11-11 DIAGNOSIS — M130 Polyarthritis, unspecified: Secondary | ICD-10-CM | POA: Diagnosis not present

## 2016-11-11 DIAGNOSIS — E119 Type 2 diabetes mellitus without complications: Secondary | ICD-10-CM | POA: Diagnosis not present

## 2016-11-11 DIAGNOSIS — M069 Rheumatoid arthritis, unspecified: Secondary | ICD-10-CM | POA: Diagnosis not present

## 2016-11-11 DIAGNOSIS — J449 Chronic obstructive pulmonary disease, unspecified: Secondary | ICD-10-CM | POA: Diagnosis not present

## 2016-11-11 DIAGNOSIS — J9611 Chronic respiratory failure with hypoxia: Secondary | ICD-10-CM | POA: Diagnosis not present

## 2016-11-11 DIAGNOSIS — I119 Hypertensive heart disease without heart failure: Secondary | ICD-10-CM | POA: Diagnosis not present

## 2016-11-11 DIAGNOSIS — I2510 Atherosclerotic heart disease of native coronary artery without angina pectoris: Secondary | ICD-10-CM | POA: Diagnosis not present

## 2016-11-11 DIAGNOSIS — Z96643 Presence of artificial hip joint, bilateral: Secondary | ICD-10-CM | POA: Diagnosis not present

## 2016-11-22 DIAGNOSIS — G4730 Sleep apnea, unspecified: Secondary | ICD-10-CM | POA: Diagnosis not present

## 2016-11-22 DIAGNOSIS — J449 Chronic obstructive pulmonary disease, unspecified: Secondary | ICD-10-CM | POA: Diagnosis not present

## 2016-11-22 DIAGNOSIS — G4733 Obstructive sleep apnea (adult) (pediatric): Secondary | ICD-10-CM | POA: Diagnosis not present

## 2016-11-23 DIAGNOSIS — J449 Chronic obstructive pulmonary disease, unspecified: Secondary | ICD-10-CM | POA: Diagnosis not present

## 2016-11-23 DIAGNOSIS — I2510 Atherosclerotic heart disease of native coronary artery without angina pectoris: Secondary | ICD-10-CM | POA: Diagnosis not present

## 2016-11-23 DIAGNOSIS — M1991 Primary osteoarthritis, unspecified site: Secondary | ICD-10-CM | POA: Diagnosis not present

## 2016-11-23 DIAGNOSIS — Z9981 Dependence on supplemental oxygen: Secondary | ICD-10-CM | POA: Diagnosis not present

## 2016-11-23 DIAGNOSIS — I252 Old myocardial infarction: Secondary | ICD-10-CM | POA: Diagnosis not present

## 2016-11-23 DIAGNOSIS — Z7902 Long term (current) use of antithrombotics/antiplatelets: Secondary | ICD-10-CM | POA: Diagnosis not present

## 2016-11-23 DIAGNOSIS — Z7984 Long term (current) use of oral hypoglycemic drugs: Secondary | ICD-10-CM | POA: Diagnosis not present

## 2016-11-23 DIAGNOSIS — E119 Type 2 diabetes mellitus without complications: Secondary | ICD-10-CM | POA: Diagnosis not present

## 2016-11-23 DIAGNOSIS — G4733 Obstructive sleep apnea (adult) (pediatric): Secondary | ICD-10-CM | POA: Diagnosis not present

## 2016-11-23 DIAGNOSIS — I10 Essential (primary) hypertension: Secondary | ICD-10-CM | POA: Diagnosis not present

## 2016-11-23 DIAGNOSIS — M069 Rheumatoid arthritis, unspecified: Secondary | ICD-10-CM | POA: Diagnosis not present

## 2016-11-30 DIAGNOSIS — Z9981 Dependence on supplemental oxygen: Secondary | ICD-10-CM | POA: Diagnosis not present

## 2016-11-30 DIAGNOSIS — I2510 Atherosclerotic heart disease of native coronary artery without angina pectoris: Secondary | ICD-10-CM | POA: Diagnosis not present

## 2016-11-30 DIAGNOSIS — J449 Chronic obstructive pulmonary disease, unspecified: Secondary | ICD-10-CM | POA: Diagnosis not present

## 2016-11-30 DIAGNOSIS — M1991 Primary osteoarthritis, unspecified site: Secondary | ICD-10-CM | POA: Diagnosis not present

## 2016-11-30 DIAGNOSIS — I252 Old myocardial infarction: Secondary | ICD-10-CM | POA: Diagnosis not present

## 2016-11-30 DIAGNOSIS — G4733 Obstructive sleep apnea (adult) (pediatric): Secondary | ICD-10-CM | POA: Diagnosis not present

## 2016-11-30 DIAGNOSIS — Z7984 Long term (current) use of oral hypoglycemic drugs: Secondary | ICD-10-CM | POA: Diagnosis not present

## 2016-11-30 DIAGNOSIS — M069 Rheumatoid arthritis, unspecified: Secondary | ICD-10-CM | POA: Diagnosis not present

## 2016-11-30 DIAGNOSIS — I10 Essential (primary) hypertension: Secondary | ICD-10-CM | POA: Diagnosis not present

## 2016-11-30 DIAGNOSIS — E119 Type 2 diabetes mellitus without complications: Secondary | ICD-10-CM | POA: Diagnosis not present

## 2016-11-30 DIAGNOSIS — Z7902 Long term (current) use of antithrombotics/antiplatelets: Secondary | ICD-10-CM | POA: Diagnosis not present

## 2016-12-02 DIAGNOSIS — I2510 Atherosclerotic heart disease of native coronary artery without angina pectoris: Secondary | ICD-10-CM | POA: Diagnosis not present

## 2016-12-02 DIAGNOSIS — J449 Chronic obstructive pulmonary disease, unspecified: Secondary | ICD-10-CM | POA: Diagnosis not present

## 2016-12-02 DIAGNOSIS — M1991 Primary osteoarthritis, unspecified site: Secondary | ICD-10-CM | POA: Diagnosis not present

## 2016-12-02 DIAGNOSIS — M069 Rheumatoid arthritis, unspecified: Secondary | ICD-10-CM | POA: Diagnosis not present

## 2016-12-02 DIAGNOSIS — I252 Old myocardial infarction: Secondary | ICD-10-CM | POA: Diagnosis not present

## 2016-12-02 DIAGNOSIS — G4733 Obstructive sleep apnea (adult) (pediatric): Secondary | ICD-10-CM | POA: Diagnosis not present

## 2016-12-02 DIAGNOSIS — Z7984 Long term (current) use of oral hypoglycemic drugs: Secondary | ICD-10-CM | POA: Diagnosis not present

## 2016-12-02 DIAGNOSIS — Z7902 Long term (current) use of antithrombotics/antiplatelets: Secondary | ICD-10-CM | POA: Diagnosis not present

## 2016-12-02 DIAGNOSIS — Z9981 Dependence on supplemental oxygen: Secondary | ICD-10-CM | POA: Diagnosis not present

## 2016-12-02 DIAGNOSIS — E119 Type 2 diabetes mellitus without complications: Secondary | ICD-10-CM | POA: Diagnosis not present

## 2016-12-02 DIAGNOSIS — I10 Essential (primary) hypertension: Secondary | ICD-10-CM | POA: Diagnosis not present

## 2016-12-06 ENCOUNTER — Ambulatory Visit: Payer: Self-pay | Admitting: Internal Medicine

## 2016-12-07 DIAGNOSIS — Z9981 Dependence on supplemental oxygen: Secondary | ICD-10-CM | POA: Diagnosis not present

## 2016-12-07 DIAGNOSIS — Z7902 Long term (current) use of antithrombotics/antiplatelets: Secondary | ICD-10-CM | POA: Diagnosis not present

## 2016-12-07 DIAGNOSIS — M1991 Primary osteoarthritis, unspecified site: Secondary | ICD-10-CM | POA: Diagnosis not present

## 2016-12-07 DIAGNOSIS — Z7984 Long term (current) use of oral hypoglycemic drugs: Secondary | ICD-10-CM | POA: Diagnosis not present

## 2016-12-07 DIAGNOSIS — I2510 Atherosclerotic heart disease of native coronary artery without angina pectoris: Secondary | ICD-10-CM | POA: Diagnosis not present

## 2016-12-07 DIAGNOSIS — I252 Old myocardial infarction: Secondary | ICD-10-CM | POA: Diagnosis not present

## 2016-12-07 DIAGNOSIS — I10 Essential (primary) hypertension: Secondary | ICD-10-CM | POA: Diagnosis not present

## 2016-12-07 DIAGNOSIS — J449 Chronic obstructive pulmonary disease, unspecified: Secondary | ICD-10-CM | POA: Diagnosis not present

## 2016-12-07 DIAGNOSIS — M069 Rheumatoid arthritis, unspecified: Secondary | ICD-10-CM | POA: Diagnosis not present

## 2016-12-07 DIAGNOSIS — E119 Type 2 diabetes mellitus without complications: Secondary | ICD-10-CM | POA: Diagnosis not present

## 2016-12-07 DIAGNOSIS — G4733 Obstructive sleep apnea (adult) (pediatric): Secondary | ICD-10-CM | POA: Diagnosis not present

## 2016-12-09 DIAGNOSIS — I2510 Atherosclerotic heart disease of native coronary artery without angina pectoris: Secondary | ICD-10-CM | POA: Diagnosis not present

## 2016-12-09 DIAGNOSIS — E119 Type 2 diabetes mellitus without complications: Secondary | ICD-10-CM | POA: Diagnosis not present

## 2016-12-09 DIAGNOSIS — J449 Chronic obstructive pulmonary disease, unspecified: Secondary | ICD-10-CM | POA: Diagnosis not present

## 2016-12-09 DIAGNOSIS — G4733 Obstructive sleep apnea (adult) (pediatric): Secondary | ICD-10-CM | POA: Diagnosis not present

## 2016-12-09 DIAGNOSIS — I10 Essential (primary) hypertension: Secondary | ICD-10-CM | POA: Diagnosis not present

## 2016-12-09 DIAGNOSIS — I252 Old myocardial infarction: Secondary | ICD-10-CM | POA: Diagnosis not present

## 2016-12-09 DIAGNOSIS — Z7984 Long term (current) use of oral hypoglycemic drugs: Secondary | ICD-10-CM | POA: Diagnosis not present

## 2016-12-09 DIAGNOSIS — M1991 Primary osteoarthritis, unspecified site: Secondary | ICD-10-CM | POA: Diagnosis not present

## 2016-12-09 DIAGNOSIS — M069 Rheumatoid arthritis, unspecified: Secondary | ICD-10-CM | POA: Diagnosis not present

## 2016-12-09 DIAGNOSIS — Z9981 Dependence on supplemental oxygen: Secondary | ICD-10-CM | POA: Diagnosis not present

## 2016-12-09 DIAGNOSIS — Z7902 Long term (current) use of antithrombotics/antiplatelets: Secondary | ICD-10-CM | POA: Diagnosis not present

## 2016-12-15 DIAGNOSIS — Z9981 Dependence on supplemental oxygen: Secondary | ICD-10-CM | POA: Diagnosis not present

## 2016-12-15 DIAGNOSIS — E119 Type 2 diabetes mellitus without complications: Secondary | ICD-10-CM | POA: Diagnosis not present

## 2016-12-15 DIAGNOSIS — I252 Old myocardial infarction: Secondary | ICD-10-CM | POA: Diagnosis not present

## 2016-12-15 DIAGNOSIS — Z7902 Long term (current) use of antithrombotics/antiplatelets: Secondary | ICD-10-CM | POA: Diagnosis not present

## 2016-12-15 DIAGNOSIS — G4733 Obstructive sleep apnea (adult) (pediatric): Secondary | ICD-10-CM | POA: Diagnosis not present

## 2016-12-15 DIAGNOSIS — Z7984 Long term (current) use of oral hypoglycemic drugs: Secondary | ICD-10-CM | POA: Diagnosis not present

## 2016-12-15 DIAGNOSIS — M1991 Primary osteoarthritis, unspecified site: Secondary | ICD-10-CM | POA: Diagnosis not present

## 2016-12-15 DIAGNOSIS — J449 Chronic obstructive pulmonary disease, unspecified: Secondary | ICD-10-CM | POA: Diagnosis not present

## 2016-12-15 DIAGNOSIS — M069 Rheumatoid arthritis, unspecified: Secondary | ICD-10-CM | POA: Diagnosis not present

## 2016-12-15 DIAGNOSIS — I2510 Atherosclerotic heart disease of native coronary artery without angina pectoris: Secondary | ICD-10-CM | POA: Diagnosis not present

## 2016-12-15 DIAGNOSIS — I10 Essential (primary) hypertension: Secondary | ICD-10-CM | POA: Diagnosis not present

## 2016-12-16 DIAGNOSIS — I252 Old myocardial infarction: Secondary | ICD-10-CM | POA: Diagnosis not present

## 2016-12-16 DIAGNOSIS — J449 Chronic obstructive pulmonary disease, unspecified: Secondary | ICD-10-CM | POA: Diagnosis not present

## 2016-12-16 DIAGNOSIS — E119 Type 2 diabetes mellitus without complications: Secondary | ICD-10-CM | POA: Diagnosis not present

## 2016-12-16 DIAGNOSIS — Z7902 Long term (current) use of antithrombotics/antiplatelets: Secondary | ICD-10-CM | POA: Diagnosis not present

## 2016-12-16 DIAGNOSIS — M1991 Primary osteoarthritis, unspecified site: Secondary | ICD-10-CM | POA: Diagnosis not present

## 2016-12-16 DIAGNOSIS — I2510 Atherosclerotic heart disease of native coronary artery without angina pectoris: Secondary | ICD-10-CM | POA: Diagnosis not present

## 2016-12-16 DIAGNOSIS — I10 Essential (primary) hypertension: Secondary | ICD-10-CM | POA: Diagnosis not present

## 2016-12-16 DIAGNOSIS — M069 Rheumatoid arthritis, unspecified: Secondary | ICD-10-CM | POA: Diagnosis not present

## 2016-12-16 DIAGNOSIS — Z7984 Long term (current) use of oral hypoglycemic drugs: Secondary | ICD-10-CM | POA: Diagnosis not present

## 2016-12-16 DIAGNOSIS — Z9981 Dependence on supplemental oxygen: Secondary | ICD-10-CM | POA: Diagnosis not present

## 2016-12-16 DIAGNOSIS — G4733 Obstructive sleep apnea (adult) (pediatric): Secondary | ICD-10-CM | POA: Diagnosis not present

## 2016-12-20 ENCOUNTER — Ambulatory Visit (HOSPITAL_COMMUNITY): Payer: 59 | Admitting: Psychiatry

## 2016-12-21 ENCOUNTER — Other Ambulatory Visit (HOSPITAL_COMMUNITY): Payer: Self-pay | Admitting: Psychiatry

## 2016-12-21 ENCOUNTER — Other Ambulatory Visit (HOSPITAL_COMMUNITY): Payer: Self-pay

## 2016-12-21 DIAGNOSIS — F330 Major depressive disorder, recurrent, mild: Secondary | ICD-10-CM

## 2016-12-21 DIAGNOSIS — G4733 Obstructive sleep apnea (adult) (pediatric): Secondary | ICD-10-CM | POA: Diagnosis not present

## 2016-12-21 DIAGNOSIS — I252 Old myocardial infarction: Secondary | ICD-10-CM | POA: Diagnosis not present

## 2016-12-21 DIAGNOSIS — J449 Chronic obstructive pulmonary disease, unspecified: Secondary | ICD-10-CM | POA: Diagnosis not present

## 2016-12-21 DIAGNOSIS — Z9981 Dependence on supplemental oxygen: Secondary | ICD-10-CM | POA: Diagnosis not present

## 2016-12-21 DIAGNOSIS — I2510 Atherosclerotic heart disease of native coronary artery without angina pectoris: Secondary | ICD-10-CM | POA: Diagnosis not present

## 2016-12-21 DIAGNOSIS — I10 Essential (primary) hypertension: Secondary | ICD-10-CM | POA: Diagnosis not present

## 2016-12-21 DIAGNOSIS — E119 Type 2 diabetes mellitus without complications: Secondary | ICD-10-CM | POA: Diagnosis not present

## 2016-12-21 DIAGNOSIS — M1991 Primary osteoarthritis, unspecified site: Secondary | ICD-10-CM | POA: Diagnosis not present

## 2016-12-21 DIAGNOSIS — Z7902 Long term (current) use of antithrombotics/antiplatelets: Secondary | ICD-10-CM | POA: Diagnosis not present

## 2016-12-21 DIAGNOSIS — Z7984 Long term (current) use of oral hypoglycemic drugs: Secondary | ICD-10-CM | POA: Diagnosis not present

## 2016-12-21 DIAGNOSIS — M069 Rheumatoid arthritis, unspecified: Secondary | ICD-10-CM | POA: Diagnosis not present

## 2016-12-21 MED ORDER — BUSPIRONE HCL 5 MG PO TABS: 5.0000 mg | ORAL_TABLET | Freq: Two times a day (BID) | ORAL | 0 refills | Status: DC

## 2016-12-21 NOTE — Progress Notes (Signed)
Patient called and states that he needs a refill on his Buspar sent to [facility]. Patient comes in next week, but it is a mail order pharmacy and he will run out of medication. Per protocol, patient has been seen recently, he has a follow up next week. Per insurance he needs a 90 day or they will not cover. 90 day supply sent to [facility].

## 2016-12-23 DIAGNOSIS — J449 Chronic obstructive pulmonary disease, unspecified: Secondary | ICD-10-CM | POA: Diagnosis not present

## 2016-12-28 ENCOUNTER — Ambulatory Visit (HOSPITAL_COMMUNITY): Payer: Self-pay | Admitting: Psychiatry

## 2016-12-29 ENCOUNTER — Ambulatory Visit (INDEPENDENT_AMBULATORY_CARE_PROVIDER_SITE_OTHER)
Admission: RE | Admit: 2016-12-29 | Discharge: 2016-12-29 | Disposition: A | Discharge status: Home / Self Care | Payer: Medicare Other | Source: Ambulatory Visit | Attending: Internal Medicine | Admitting: Internal Medicine

## 2016-12-29 ENCOUNTER — Encounter: Payer: Self-pay | Admitting: Internal Medicine

## 2016-12-29 ENCOUNTER — Ambulatory Visit (INDEPENDENT_AMBULATORY_CARE_PROVIDER_SITE_OTHER): Payer: Medicare Other | Admitting: Internal Medicine

## 2016-12-29 VITALS — BP 124/82 | HR 104 | Ht 70.0 in | Wt 205.0 lb

## 2016-12-29 DIAGNOSIS — J449 Chronic obstructive pulmonary disease, unspecified: Secondary | ICD-10-CM

## 2016-12-29 DIAGNOSIS — J9611 Chronic respiratory failure with hypoxia: Secondary | ICD-10-CM

## 2016-12-29 NOTE — Progress Notes (Signed)
Subjective:    Patient ID: [name], male    DOB: 11-Feb-1951  MRN: [id number]    Brief patient profile:  39 yobm quit smoking 12/03/14 on disability due to depression since 1994 baseline on spiriva with  doe room to room s 02 then underwent L THR 03/2013  at [facility] and noted even in hosp need 02 with any activity so referred to pulmonary clinic 05/01/2013 by [facility] with GOLD III copd with mild reversibility documented 06/12/13    History of Present Illness  05/01/2013 1st [facility] office visit/ [name] cc doe x room to room if not on 02 using walker ever since mobilized from surgery 04/03/13 s cough, some chest tightness once or twice per week esp after cigarette use, has not tried inhalers at home (other than spiriva)  but nebs in hosp seemed to help rec Start symbicort 160 Take 2 puffs first thing in am and then another 2 puffs about 12 hours later.  Continue spiriva one in am Work on inhaler technique:    Please remember to go to the lab and x-ray department downstairs for your tests - we will call you with the results when they are available. The key is to stop smoking completely before smoking completely stops you!      02/10/2016  f/u ov/[name] re: COPD GOLD III/ dulera 200/ spiriva /no med calendar  Chief Complaint  Patient presents with  . Follow-up    PFT's done today. Breathing has improved since the last visit.   ex x 15-20 min s stopping at 2lpm / 2lpm hs  rec Plan A = Automatic =  Performist(formoterol) with budesonide(Pulmonary)  = symbicort/dulera   Twice daily through [place] to use up your spiriva remaining if can't afford to refill it  Plan B = Backup Only use your albuterol (Proair) as rescue   12/29/2016  f/u ov/[name] re:  COPD GOLD III now on symb 160 2bid/ spiriva smi Chief Complaint  Patient presents with  . Follow-up    pt has good and bad days- sob with exertion, prod cough, chest tightness with exertion on bad days.  states today is a good  day.    ex for up to 15 min with therapist then rests but continues for up to an hour with sats upper 80s on 3lpm with ex/ 2lpm otherwise.  Never tries saba before ex but thinks it helps after he stops and rests   No obvious patterns in day to day or daytime variability or assoc excess/ purulent sputum or mucus plugs or hemoptysis or cp or chest tightness, subjective wheeze or overt sinus or hb symptoms. No unusual exp hx or h/o childhood pna/ asthma or knowledge of premature birth.  Sleeping ok without nocturnal  or early am exacerbation  of respiratory  c/o's or need for noct saba. Also denies any obvious fluctuation of symptoms with weather or environmental changes or other aggravating or alleviating factors except as outlined above   Current Medications, Allergies, Complete Past Medical History, Past Surgical History, Family History, and Social History were reviewed in [employer] record.  ROS  The following are not active complaints unless bolded sore throat, dysphagia, dental problems, itching, sneezing,  nasal congestion or excess/ purulent secretions, ear ache,   fever, chills, sweats, unintended wt loss, classically pleuritic or exertional cp,  orthopnea pnd or leg swelling, presyncope, palpitations, abdominal pain, anorexia, nausea, vomiting, diarrhea  or change in bowel or bladder habits,  change in stools or urine, dysuria,hematuria,  rash, arthralgias, visual complaints, headache, numbness, weakness or ataxia or problems with walking or coordination,  change in mood/affect or memory.                     Objective:   Physical Exam  W/c bound very anxious bm nad / vital signs reviewed - note sats 93% on 3lpm in w/c on arrival     06/12/2013   200 >  08/30/2013  189 >186 09/14/2013 > 10/30/2013  184> 01/28/2014  184 > 03/06/14  194 > 06/26/2014  200> 11/29/2014  200> 05/28/2015  212 > 11/10/2015   202 > 02/10/2016  200 > 12/29/2016    205   HEENT: nl dentition,  and  oropharynx. Nl external ear canals without cough reflex - mild  bilateral non-specific turbinate edema     NECK :  without JVD/Nodes/TM/ nl carotid upstrokes bilaterally   LUNGS: no acc muscle use,  Barrel chest with distant bs bilaterally, minimal late ext wheezes   CV:  RRR  no s3 or murmur or increase in P2, and no edema   ABD:  soft and nontender with nl inspiratory excursion in the supine position. No bruits or organomegaly appreciated, bowel sounds nl  MS:  Nl gait/ ext warm without deformities, calf tenderness, cyanosis or clubbing No obvious joint restrictions   SKIN: warm and dry without lesions    NEURO:  alert, approp, nl sensorium with  no motor or cerebellar deficits apparent.      CXR PA and Lateral:   12/29/2016 :    I personally reviewed images and agree with radiology impression as follows:   1. No acute cardiopulmonary disease. 2. Stable chronic lung changes and COPD.      Assessment & Plan:

## 2016-12-29 NOTE — Patient Instructions (Addendum)
Goal with 02 is to keep the saturation over 90% so ok to increase  with exertion to maintain this sat  Ok to try the proair x 2 pffs  15 min prior to exercise to see if there is any benefit   Work on inhaler technique:  relax and gently blow all the way out then take a nice smooth deep breath back in, triggering the inhaler at same time you start breathing in.  Hold for up to 5 seconds if you can. Blow out thru nose. Rinse and gargle with water when done    See calendar for specific medication instructions and bring it back for each and every office visit for every healthcare provider you see.  Without it,  you may not receive the best quality medical care that we feel you deserve.  You will note that the calendar groups together  your maintenance  medications that are timed at particular times of the day.  Think of this as your checklist for what your doctor has instructed you to do until your next evaluation to see what benefit  there is  to staying on a consistent group of medications intended to keep you well.  The other group at the bottom is entirely up to you to use as you see fit  for specific symptoms that may arise between visits that require you to treat them on an as needed basis.  Think of this as your action plan or "what if" list.   Separating the top medications from the bottom group is fundamental to providing you adequate care going forward.      Please remember to go to the  x-ray department downstairs in the basement  for your tests - we will call you with the results when they are available.      Please schedule a follow up visit in 6  months but call sooner if needed

## 2016-12-30 NOTE — Assessment & Plan Note (Signed)
Placed on 02 at discharge p THR 03/2013 3h/day and cpap at hs RA sats at rest 94% 06/27/14  so rec use 02 with activity only  - 11/29/2014   Walked RA x one lap @ 185 stopped due to hip pain/ sob with sat 88% > referred for POC  - 05/28/2015 sats ok at rest RA/  Walked on 3lpm  x one lap @ 185 stopped due to sob/ chest tightness but sats ok    As of 12/29/2016 rec  targeting sats > 90% / and 2lpm hs and at rest

## 2016-12-30 NOTE — Assessment & Plan Note (Signed)
-   PFT's 06/12/13  FEV1  1.15 (37%) with ratrio 43 and 14% better p B2 p no rx day of study and DLCO 29% -09/14/2013 med calendar > did not bring to office as requested 10/30/13 or 01/28/14 or 06/27/14  - 01/28/2014 p extensive coaching HFA effectiveness =    75%, 100% with dpi  - PFT's  02/10/2016  FEV1 1.06  (35 % ) ratio 44  p -6 % improvement from saba p prior to study with DLCO  24 % corrects to 37  % for alv volume    - 12/29/2016  After extensive coaching HFA effectiveness =    75% (short Ti)    Group D in terms of symptom/risk and laba/lama/ICS  therefore appropriate rx at this point.    I suspect he has a lot of anxiety and depression also playing a role in his degree of debilitation but nothing else to offer at this point other than the suggestion he try the saba 15 m before his PT and keep his sats above 90% at all times to reduce air hunger on this basis (which leads to more air trapping which leads to more anxiety and depression)  I had an extended discussion with the patient/wife reviewing all relevant studies completed to date and  lasting 15 to 20 minutes of a 25 minute visit    Each maintenance medication was reviewed in detail including most importantly the difference between maintenance and prns and under what circumstances the prns are to be triggered using an action plan format that is not reflected in the computer generated alphabetically organized AVS but trather by a customized med calendar that reflects the AVS meds with confirmed 100% correlation.   In addition, Please see AVS for unique instructions that I personally wrote and verbalized to the the pt in detail and then reviewed with pt  by my nurse highlighting any  changes in therapy recommended at today's visit to their plan of care.

## 2016-12-31 DIAGNOSIS — I2510 Atherosclerotic heart disease of native coronary artery without angina pectoris: Secondary | ICD-10-CM | POA: Diagnosis not present

## 2016-12-31 DIAGNOSIS — I10 Essential (primary) hypertension: Secondary | ICD-10-CM | POA: Diagnosis not present

## 2016-12-31 DIAGNOSIS — G4733 Obstructive sleep apnea (adult) (pediatric): Secondary | ICD-10-CM | POA: Diagnosis not present

## 2016-12-31 DIAGNOSIS — M1991 Primary osteoarthritis, unspecified site: Secondary | ICD-10-CM | POA: Diagnosis not present

## 2016-12-31 DIAGNOSIS — Z7984 Long term (current) use of oral hypoglycemic drugs: Secondary | ICD-10-CM | POA: Diagnosis not present

## 2016-12-31 DIAGNOSIS — Z7902 Long term (current) use of antithrombotics/antiplatelets: Secondary | ICD-10-CM | POA: Diagnosis not present

## 2016-12-31 DIAGNOSIS — Z9981 Dependence on supplemental oxygen: Secondary | ICD-10-CM | POA: Diagnosis not present

## 2016-12-31 DIAGNOSIS — E119 Type 2 diabetes mellitus without complications: Secondary | ICD-10-CM | POA: Diagnosis not present

## 2016-12-31 DIAGNOSIS — M069 Rheumatoid arthritis, unspecified: Secondary | ICD-10-CM | POA: Diagnosis not present

## 2016-12-31 DIAGNOSIS — I252 Old myocardial infarction: Secondary | ICD-10-CM | POA: Diagnosis not present

## 2016-12-31 DIAGNOSIS — J449 Chronic obstructive pulmonary disease, unspecified: Secondary | ICD-10-CM | POA: Diagnosis not present

## 2017-01-03 DIAGNOSIS — I2510 Atherosclerotic heart disease of native coronary artery without angina pectoris: Secondary | ICD-10-CM | POA: Diagnosis not present

## 2017-01-03 DIAGNOSIS — M1991 Primary osteoarthritis, unspecified site: Secondary | ICD-10-CM | POA: Diagnosis not present

## 2017-01-03 DIAGNOSIS — Z7902 Long term (current) use of antithrombotics/antiplatelets: Secondary | ICD-10-CM | POA: Diagnosis not present

## 2017-01-03 DIAGNOSIS — I252 Old myocardial infarction: Secondary | ICD-10-CM | POA: Diagnosis not present

## 2017-01-03 DIAGNOSIS — M069 Rheumatoid arthritis, unspecified: Secondary | ICD-10-CM | POA: Diagnosis not present

## 2017-01-03 DIAGNOSIS — I10 Essential (primary) hypertension: Secondary | ICD-10-CM | POA: Diagnosis not present

## 2017-01-03 DIAGNOSIS — G4733 Obstructive sleep apnea (adult) (pediatric): Secondary | ICD-10-CM | POA: Diagnosis not present

## 2017-01-03 DIAGNOSIS — Z7984 Long term (current) use of oral hypoglycemic drugs: Secondary | ICD-10-CM | POA: Diagnosis not present

## 2017-01-03 DIAGNOSIS — J449 Chronic obstructive pulmonary disease, unspecified: Secondary | ICD-10-CM | POA: Diagnosis not present

## 2017-01-03 DIAGNOSIS — Z9981 Dependence on supplemental oxygen: Secondary | ICD-10-CM | POA: Diagnosis not present

## 2017-01-03 DIAGNOSIS — E119 Type 2 diabetes mellitus without complications: Secondary | ICD-10-CM | POA: Diagnosis not present

## 2017-01-07 DIAGNOSIS — Z9981 Dependence on supplemental oxygen: Secondary | ICD-10-CM | POA: Diagnosis not present

## 2017-01-07 DIAGNOSIS — Z7984 Long term (current) use of oral hypoglycemic drugs: Secondary | ICD-10-CM | POA: Diagnosis not present

## 2017-01-07 DIAGNOSIS — I10 Essential (primary) hypertension: Secondary | ICD-10-CM | POA: Diagnosis not present

## 2017-01-07 DIAGNOSIS — E119 Type 2 diabetes mellitus without complications: Secondary | ICD-10-CM | POA: Diagnosis not present

## 2017-01-07 DIAGNOSIS — M1991 Primary osteoarthritis, unspecified site: Secondary | ICD-10-CM | POA: Diagnosis not present

## 2017-01-07 DIAGNOSIS — I252 Old myocardial infarction: Secondary | ICD-10-CM | POA: Diagnosis not present

## 2017-01-07 DIAGNOSIS — G4733 Obstructive sleep apnea (adult) (pediatric): Secondary | ICD-10-CM | POA: Diagnosis not present

## 2017-01-07 DIAGNOSIS — M069 Rheumatoid arthritis, unspecified: Secondary | ICD-10-CM | POA: Diagnosis not present

## 2017-01-07 DIAGNOSIS — J449 Chronic obstructive pulmonary disease, unspecified: Secondary | ICD-10-CM | POA: Diagnosis not present

## 2017-01-07 DIAGNOSIS — I2510 Atherosclerotic heart disease of native coronary artery without angina pectoris: Secondary | ICD-10-CM | POA: Diagnosis not present

## 2017-01-07 DIAGNOSIS — Z7902 Long term (current) use of antithrombotics/antiplatelets: Secondary | ICD-10-CM | POA: Diagnosis not present

## 2017-01-11 DIAGNOSIS — Z7902 Long term (current) use of antithrombotics/antiplatelets: Secondary | ICD-10-CM | POA: Diagnosis not present

## 2017-01-11 DIAGNOSIS — G4733 Obstructive sleep apnea (adult) (pediatric): Secondary | ICD-10-CM | POA: Diagnosis not present

## 2017-01-11 DIAGNOSIS — J449 Chronic obstructive pulmonary disease, unspecified: Secondary | ICD-10-CM | POA: Diagnosis not present

## 2017-01-11 DIAGNOSIS — E119 Type 2 diabetes mellitus without complications: Secondary | ICD-10-CM | POA: Diagnosis not present

## 2017-01-11 DIAGNOSIS — I10 Essential (primary) hypertension: Secondary | ICD-10-CM | POA: Diagnosis not present

## 2017-01-11 DIAGNOSIS — M069 Rheumatoid arthritis, unspecified: Secondary | ICD-10-CM | POA: Diagnosis not present

## 2017-01-11 DIAGNOSIS — Z7984 Long term (current) use of oral hypoglycemic drugs: Secondary | ICD-10-CM | POA: Diagnosis not present

## 2017-01-11 DIAGNOSIS — M1991 Primary osteoarthritis, unspecified site: Secondary | ICD-10-CM | POA: Diagnosis not present

## 2017-01-11 DIAGNOSIS — Z9981 Dependence on supplemental oxygen: Secondary | ICD-10-CM | POA: Diagnosis not present

## 2017-01-11 DIAGNOSIS — I2510 Atherosclerotic heart disease of native coronary artery without angina pectoris: Secondary | ICD-10-CM | POA: Diagnosis not present

## 2017-01-11 DIAGNOSIS — I252 Old myocardial infarction: Secondary | ICD-10-CM | POA: Diagnosis not present

## 2017-01-14 DIAGNOSIS — I252 Old myocardial infarction: Secondary | ICD-10-CM | POA: Diagnosis not present

## 2017-01-14 DIAGNOSIS — I10 Essential (primary) hypertension: Secondary | ICD-10-CM | POA: Diagnosis not present

## 2017-01-14 DIAGNOSIS — J449 Chronic obstructive pulmonary disease, unspecified: Secondary | ICD-10-CM | POA: Diagnosis not present

## 2017-01-14 DIAGNOSIS — M1991 Primary osteoarthritis, unspecified site: Secondary | ICD-10-CM | POA: Diagnosis not present

## 2017-01-14 DIAGNOSIS — M069 Rheumatoid arthritis, unspecified: Secondary | ICD-10-CM | POA: Diagnosis not present

## 2017-01-14 DIAGNOSIS — E119 Type 2 diabetes mellitus without complications: Secondary | ICD-10-CM | POA: Diagnosis not present

## 2017-01-14 DIAGNOSIS — Z9981 Dependence on supplemental oxygen: Secondary | ICD-10-CM | POA: Diagnosis not present

## 2017-01-14 DIAGNOSIS — I2510 Atherosclerotic heart disease of native coronary artery without angina pectoris: Secondary | ICD-10-CM | POA: Diagnosis not present

## 2017-01-14 DIAGNOSIS — G4733 Obstructive sleep apnea (adult) (pediatric): Secondary | ICD-10-CM | POA: Diagnosis not present

## 2017-01-14 DIAGNOSIS — Z7984 Long term (current) use of oral hypoglycemic drugs: Secondary | ICD-10-CM | POA: Diagnosis not present

## 2017-01-14 DIAGNOSIS — Z7902 Long term (current) use of antithrombotics/antiplatelets: Secondary | ICD-10-CM | POA: Diagnosis not present

## 2017-01-19 DIAGNOSIS — G4733 Obstructive sleep apnea (adult) (pediatric): Secondary | ICD-10-CM | POA: Diagnosis not present

## 2017-01-19 DIAGNOSIS — G4730 Sleep apnea, unspecified: Secondary | ICD-10-CM | POA: Diagnosis not present

## 2017-01-20 DIAGNOSIS — E119 Type 2 diabetes mellitus without complications: Secondary | ICD-10-CM | POA: Diagnosis not present

## 2017-01-20 DIAGNOSIS — Z9981 Dependence on supplemental oxygen: Secondary | ICD-10-CM | POA: Diagnosis not present

## 2017-01-20 DIAGNOSIS — I2510 Atherosclerotic heart disease of native coronary artery without angina pectoris: Secondary | ICD-10-CM | POA: Diagnosis not present

## 2017-01-20 DIAGNOSIS — I10 Essential (primary) hypertension: Secondary | ICD-10-CM | POA: Diagnosis not present

## 2017-01-20 DIAGNOSIS — J449 Chronic obstructive pulmonary disease, unspecified: Secondary | ICD-10-CM | POA: Diagnosis not present

## 2017-01-20 DIAGNOSIS — M1991 Primary osteoarthritis, unspecified site: Secondary | ICD-10-CM | POA: Diagnosis not present

## 2017-01-20 DIAGNOSIS — M069 Rheumatoid arthritis, unspecified: Secondary | ICD-10-CM | POA: Diagnosis not present

## 2017-01-20 DIAGNOSIS — Z7902 Long term (current) use of antithrombotics/antiplatelets: Secondary | ICD-10-CM | POA: Diagnosis not present

## 2017-01-20 DIAGNOSIS — I252 Old myocardial infarction: Secondary | ICD-10-CM | POA: Diagnosis not present

## 2017-01-20 DIAGNOSIS — Z7984 Long term (current) use of oral hypoglycemic drugs: Secondary | ICD-10-CM | POA: Diagnosis not present

## 2017-01-20 DIAGNOSIS — G4733 Obstructive sleep apnea (adult) (pediatric): Secondary | ICD-10-CM | POA: Diagnosis not present

## 2017-01-23 DIAGNOSIS — J449 Chronic obstructive pulmonary disease, unspecified: Secondary | ICD-10-CM | POA: Diagnosis not present

## 2017-01-24 ENCOUNTER — Other Ambulatory Visit (HOSPITAL_COMMUNITY): Payer: Self-pay | Admitting: Psychiatry

## 2017-01-24 DIAGNOSIS — M069 Rheumatoid arthritis, unspecified: Secondary | ICD-10-CM | POA: Diagnosis not present

## 2017-01-24 DIAGNOSIS — I2510 Atherosclerotic heart disease of native coronary artery without angina pectoris: Secondary | ICD-10-CM | POA: Diagnosis not present

## 2017-01-24 DIAGNOSIS — Z7984 Long term (current) use of oral hypoglycemic drugs: Secondary | ICD-10-CM | POA: Diagnosis not present

## 2017-01-24 DIAGNOSIS — J449 Chronic obstructive pulmonary disease, unspecified: Secondary | ICD-10-CM | POA: Diagnosis not present

## 2017-01-24 DIAGNOSIS — M1991 Primary osteoarthritis, unspecified site: Secondary | ICD-10-CM | POA: Diagnosis not present

## 2017-01-24 DIAGNOSIS — G4733 Obstructive sleep apnea (adult) (pediatric): Secondary | ICD-10-CM | POA: Diagnosis not present

## 2017-01-24 DIAGNOSIS — Z7902 Long term (current) use of antithrombotics/antiplatelets: Secondary | ICD-10-CM | POA: Diagnosis not present

## 2017-01-24 DIAGNOSIS — E119 Type 2 diabetes mellitus without complications: Secondary | ICD-10-CM | POA: Diagnosis not present

## 2017-01-24 DIAGNOSIS — I10 Essential (primary) hypertension: Secondary | ICD-10-CM | POA: Diagnosis not present

## 2017-01-24 DIAGNOSIS — Z9981 Dependence on supplemental oxygen: Secondary | ICD-10-CM | POA: Diagnosis not present

## 2017-01-24 DIAGNOSIS — I252 Old myocardial infarction: Secondary | ICD-10-CM | POA: Diagnosis not present

## 2017-01-24 DIAGNOSIS — F330 Major depressive disorder, recurrent, mild: Secondary | ICD-10-CM

## 2017-01-25 ENCOUNTER — Other Ambulatory Visit (HOSPITAL_COMMUNITY): Payer: Self-pay | Admitting: Psychiatry

## 2017-01-25 ENCOUNTER — Encounter (HOSPITAL_COMMUNITY): Payer: Self-pay | Admitting: Psychiatry

## 2017-01-25 ENCOUNTER — Ambulatory Visit (INDEPENDENT_AMBULATORY_CARE_PROVIDER_SITE_OTHER): Payer: Medicare Other | Admitting: Psychiatry

## 2017-01-25 VITALS — BP 132/78 | HR 102 | Ht 70.0 in | Wt 205.0 lb

## 2017-01-25 DIAGNOSIS — F330 Major depressive disorder, recurrent, mild: Secondary | ICD-10-CM

## 2017-01-25 DIAGNOSIS — F419 Anxiety disorder, unspecified: Secondary | ICD-10-CM

## 2017-01-25 DIAGNOSIS — R450 Nervousness: Secondary | ICD-10-CM

## 2017-01-25 DIAGNOSIS — Z818 Family history of other mental and behavioral disorders: Secondary | ICD-10-CM

## 2017-01-25 DIAGNOSIS — R0602 Shortness of breath: Secondary | ICD-10-CM

## 2017-01-25 DIAGNOSIS — Z87891 Personal history of nicotine dependence: Secondary | ICD-10-CM | POA: Diagnosis not present

## 2017-01-25 MED ORDER — BUSPIRONE HCL 5 MG PO TABS: 5.0000 mg | ORAL_TABLET | Freq: Two times a day (BID) | ORAL | 0 refills | Status: DC

## 2017-01-25 MED ORDER — DULOXETINE HCL 20 MG PO CPEP: ORAL_CAPSULE | ORAL | 1 refills | Status: DC

## 2017-01-25 MED ORDER — MIRTAZAPINE 45 MG PO TABS: 45.0000 mg | ORAL_TABLET | Freq: Every day | ORAL | 0 refills | Status: DC

## 2017-01-25 NOTE — Progress Notes (Signed)
[facility] MD/PA/NP OP Progress Note  01/25/2017 3:18 PM [name]  MRN:  [id number]  Chief Complaint:  I still feel anxious and nervous.  I have no energy.  HPI: Patient came for his follow-up appointment with his wife.  He has not seen more than 9 months.  He apologized missing the appointment.  He is taking Remeron 45 mg and BuSpar 5 mg twice a day but is still feel anxious nervous and has no energy.  He sleeping good with trazodone.  Though he denies any crying spells but feels isolated withdrawn and lack of motivation to do things.  He is using CPAP machine.  He is seeing his primary care physician [name] for his medical needs.  He denies any suicidal thoughts or homicidal thought.  He denies drinking alcohol or using any illegal substances.  He lives with his wife who is very supportive.  Patient uses wheelchair because of generalized weakness and neuropathy.  He is getting physical therapy at home but he does not see any improvement in his generalized weakness.  Visit Diagnosis:    ICD-10-CM   1. Major depressive disorder, recurrent episode, mild (HCC) F33.0 busPIRone (BUSPAR) 5 MG tablet    mirtazapine (REMERON) 45 MG tablet    DULoxetine (CYMBALTA) 20 MG capsule    Past Psychiatric History: Reviewed. Patient has history of depression since 8. He has one suicidal attempt . In the past he had tried Zoloft, Paxil, Prozac, Vistaril, trazodone and Wellbutrin but he stopped due to side effects. Patient has significant history of using cocaine marijuana and alcohol for 20 years. He claims to be sober since he's been coming to this office.   Past Medical History:  Past Medical History:  Diagnosis Date  . Anxiety   . Arthritis   . COPD (chronic obstructive pulmonary disease) ([place])   . Coronary artery disease    x4  . Depression   . Diabetes mellitus without complication ([place])   . GERD (gastroesophageal reflux disease)    tums will relieve  . HLD (hyperlipidemia)   . HTN  (hypertension)   . Myocardial infarction ([place])    x2  . Obstructive sleep apnea    sleep study 2006, uses CPAP, pt does not know cpap settings  . On home oxygen therapy    2L via nasal cannula prn  . Shortness of breath    exertion    Past Surgical History:  Procedure Laterality Date  . CARDIAC CATHETERIZATION  2003,2010   x4   . COLONOSCOPY WITH PROPOFOL N/A 08/30/2014   Procedure: COLONOSCOPY WITH PROPOFOL;  Surgeon: Carol Ada, MD;  Location: [place];  Service: Endoscopy;  Laterality: N/A;  h&p in file   . CORONARY ANGIOPLASTY     stents x 4  . DENTAL SURGERY    . TOTAL HIP ARTHROPLASTY  04/06/2012   Procedure: TOTAL HIP ARTHROPLASTY;  Surgeon: Sharmon Revere, MD;  Location: [place];  Service: Orthopedics;  Laterality: Right;  . TOTAL HIP ARTHROPLASTY Left 04/03/2013   Procedure: LEFT TOTAL HIP ARTHROPLASTY;  Surgeon: Sharmon Revere, MD;  Location: [place];  Service: Orthopedics;  Laterality: Left;    Family Psychiatric History: Reviewed.  Family History:  Family History  Problem Relation Age of Onset  . Depression Mother   . Heart disease Father   . Emphysema Father        smoked    Social History:  Social History   Social History  . Marital status: Married  Spouse name: N/A  . Number of children: N/A  . Years of education: N/A   Social History Main Topics  . Smoking status: Former Smoker    Packs/day: 0.25    Years: 45.00    Types: Cigarettes    Quit date: 11/24/2014  . Smokeless tobacco: Never Used  . Alcohol use No     Comment: Reports drinks a beer maybe 4 times a year.  . Drug use: No  . Sexual activity: Yes    Partners: Female    Birth control/ protection: None   Other Topics Concern  . None   Social History Narrative  . None    Allergies: No Known Allergies  Metabolic Disorder Labs: Lab Results  Component Value Date   HGBA1C  06/22/2008    5.7 (NOTE)   The ADA recommends the following therapeutic goal for glycemic   control  related to Hgb A1C measurement:   Goal of Therapy:   < 7.0% Hgb A1C   Reference: American Diabetes Association: Clinical Practice   Recommendations 2008, Diabetes Care,  2008, 31:(Suppl 1).   MPG 117 06/22/2008   No results found for: PROLACTIN Lab Results  Component Value Date   CHOL  06/23/2008    174        ATP III CLASSIFICATION:  <200     mg/dL   Desirable  200-239  mg/dL   Borderline High  >=240    mg/dL   High          TRIG 41 06/23/2008   HDL 46 06/23/2008   CHOLHDL 3.8 06/23/2008   VLDL 8 06/23/2008   LDLCALC (H) 06/23/2008    120        Total Cholesterol/HDL:CHD Risk Coronary Heart Disease Risk Table                     Men   Women  1/2 Average Risk   3.4   3.3  Average Risk       5.0   4.4  2 X Average Risk   9.6   7.1  3 X Average Risk  23.4   11.0        Use the calculated Patient Ratio above and the CHD Risk Table to determine the patient's CHD Risk.        ATP III CLASSIFICATION (LDL):  <100     mg/dL   Optimal  100-129  mg/dL   Near or Above                    Optimal  130-159  mg/dL   Borderline  160-189  mg/dL   High  >190     mg/dL   Very High   Lab Results  Component Value Date   TSH 1.958 Test methodology is 3rd generation TSH 12/07/2008   TSH 1.137 Test methodology is 3rd generation TSH 06/24/2008    Therapeutic Level Labs: No results found for: LITHIUM No results found for: VALPROATE No components found for:  CBMZ  Current Medications: Current Outpatient Prescriptions  Medication Sig Dispense Refill  . albuterol (PROAIR HFA) 108 (90 Base) MCG/ACT inhaler 2 puffs every 4 hours as needed only  if your can't catch your breath 3 Inhaler 1  . amLODipine (NORVASC) 5 MG tablet Take 5 mg by mouth at bedtime.     Marland Kitchen aspirin EC 81 MG tablet Take 81 mg by mouth at bedtime.    Marland Kitchen atenolol (TENORMIN) 25 MG tablet  Take 25 mg by mouth at bedtime.     . budesonide-formoterol (SYMBICORT) 160-4.5 MCG/ACT inhaler Inhale 2 puffs into the lungs 2 (two) times  daily. 2 Inhaler 0  . busPIRone (BUSPAR) 5 MG tablet Take 1 tablet (5 mg total) by mouth 2 (two) times daily. 180 tablet 0  . clopidogrel (PLAVIX) 75 MG tablet Take 75 mg by mouth at bedtime.     . Coenzyme Q10 (COQ10) 100 MG CAPS Take 1 capsule by mouth at bedtime.     Marland Kitchen dextromethorphan-guaiFENesin (MUCINEX DM) 30-600 MG 12hr tablet Take 1 tablet by mouth 2 (two) times daily.    . metFORMIN (GLUCOPHAGE) 500 MG tablet 2 tabs by mouth once daily at bedtime    . mirtazapine (REMERON) 30 MG tablet Take 1.5 tablets (45 mg total) by mouth at bedtime. 135 tablet 0  . Multiple Vitamin (MULTIVITAMIN WITH MINERALS) TABS Take 1 tablet by mouth at bedtime.     . naproxen (NAPROSYN) 500 MG tablet Take 500 mg by mouth 2 (two) times daily as needed.    . nitroGLYCERIN (NITROSTAT) 0.4 MG SL tablet Place 0.4 mg under the tongue every 5 (five) minutes as needed for chest pain.    . OXYGEN Inhale 2-3 L into the lungs. With exertion only    . pravastatin (PRAVACHOL) 40 MG tablet Take 40 mg by mouth at bedtime.     . sildenafil (VIAGRA) 25 MG tablet Take 25 mg by mouth See admin instructions. Use as directed    . sodium chloride (OCEAN) 0.65 % SOLN nasal spray Place 1 spray into both nostrils as needed for congestion.    . Tiotropium Bromide Monohydrate (SPIRIVA RESPIMAT) 2.5 MCG/ACT AERS Inhale 2 puffs into the lungs every morning. 2 Inhaler 0   No current facility-administered medications for this visit.      Musculoskeletal: Strength & Muscle Tone: decreased Gait & Station: unsteady Patient leans: Using wheelchair  Psychiatric Specialty Exam: Review of Systems  Respiratory: Positive for shortness of breath.        On oxygen  Musculoskeletal:       Uses wheelchair for generalized weakness  Skin: Negative.  Negative for itching and rash.  Neurological: Positive for tingling.  Psychiatric/Behavioral: Positive for depression. The patient is nervous/anxious.     Blood pressure 132/78, pulse (!) 102,  height 5\' 10"  (1.778 m), weight 205 lb (93 kg).Body mass index is 29.41 kg/m.  General Appearance: Fairly Groomed  Eye Contact:  Fair  Speech:  Slow  Volume:  Decreased  Mood:  Anxious and Dysphoric  Affect:  Constricted  Thought Process:  Goal Directed  Orientation:  Full (Time, Place, and Person)  Thought Content: Rumination   Suicidal Thoughts:  No  Homicidal Thoughts:  No  Memory:  Immediate;   Fair Recent;   Fair Remote;   Fair  Judgement:  Fair  Insight:  Fair  Psychomotor Activity:  Decreased  Concentration:  Concentration: Fair and Attention Span: Fair  Recall:  Good  Fund of Knowledge: Good  Language: Good  Akathisia:  No  Handed:  Right  AIMS (if indicated): not done  Assets:  Communication Skills Desire for Improvement Housing Social Support  ADL's:  Intact  Cognition: WNL  Sleep:  Fair   Screenings:   Assessment and Plan: Major depressive disorder, recurrent.  Anxiety disorder NOS.  Reassurance given.  Recommended to keep his appointment and continue to take his medication.  Recommended to try Cymbalta 20 mg for 1 week and than  40 mg a day.  Continue Remeron 45 mg it is helping his sleep but patient continues to have anxiety and residual depression.  Patient uses oxygen and he has chronic fatigue.  Patient is scheduled to see his primary care physician [name] on 21st and I recommended to have his physical and get testosterone level.  Recommended to call us back if he has any question or any concern.  Follow-up in 6 weeks.  Time spent 25 minutes.   Zuria Fosdick T., MD 01/25/2017, 3:18 PM

## 2017-01-26 ENCOUNTER — Other Ambulatory Visit (HOSPITAL_COMMUNITY): Payer: Self-pay | Admitting: Psychiatry

## 2017-01-26 ENCOUNTER — Telehealth (HOSPITAL_COMMUNITY): Payer: Self-pay

## 2017-01-26 DIAGNOSIS — F330 Major depressive disorder, recurrent, mild: Secondary | ICD-10-CM

## 2017-01-26 MED ORDER — BUSPIRONE HCL 5 MG PO TABS: 5.0000 mg | ORAL_TABLET | Freq: Two times a day (BID) | ORAL | 0 refills | Status: DC

## 2017-01-26 MED ORDER — AMITRIPTYLINE HCL 50 MG PO TABS: 50.0000 mg | ORAL_TABLET | Freq: Every day | ORAL | 0 refills | Status: DC

## 2017-01-26 NOTE — Telephone Encounter (Signed)
I called [facility] and spoke to the pharmacist. I mis-spoke in my previous message, it was Cymbalta not Celexa. Patients price for 90 day supply is $120 and he just can not afford it. Please review and advise, thank you

## 2017-01-26 NOTE — Telephone Encounter (Signed)
It is $4 about insurance on [facility] .  He should get about insurance.

## 2017-01-26 NOTE — Telephone Encounter (Signed)
He can try amitriptyline 50 mg at bedtime

## 2017-01-26 NOTE — Telephone Encounter (Signed)
Patient calling to let you know that generic Celexa with his insurance is still $120 a month and he can not afford that. Please review and advise, thank you

## 2017-01-28 DIAGNOSIS — E119 Type 2 diabetes mellitus without complications: Secondary | ICD-10-CM | POA: Diagnosis not present

## 2017-01-28 DIAGNOSIS — I252 Old myocardial infarction: Secondary | ICD-10-CM | POA: Diagnosis not present

## 2017-01-28 DIAGNOSIS — Z7902 Long term (current) use of antithrombotics/antiplatelets: Secondary | ICD-10-CM | POA: Diagnosis not present

## 2017-01-28 DIAGNOSIS — Z9981 Dependence on supplemental oxygen: Secondary | ICD-10-CM | POA: Diagnosis not present

## 2017-01-28 DIAGNOSIS — M069 Rheumatoid arthritis, unspecified: Secondary | ICD-10-CM | POA: Diagnosis not present

## 2017-01-28 DIAGNOSIS — I2510 Atherosclerotic heart disease of native coronary artery without angina pectoris: Secondary | ICD-10-CM | POA: Diagnosis not present

## 2017-01-28 DIAGNOSIS — M1991 Primary osteoarthritis, unspecified site: Secondary | ICD-10-CM | POA: Diagnosis not present

## 2017-01-28 DIAGNOSIS — G4733 Obstructive sleep apnea (adult) (pediatric): Secondary | ICD-10-CM | POA: Diagnosis not present

## 2017-01-28 DIAGNOSIS — I10 Essential (primary) hypertension: Secondary | ICD-10-CM | POA: Diagnosis not present

## 2017-01-28 DIAGNOSIS — J449 Chronic obstructive pulmonary disease, unspecified: Secondary | ICD-10-CM | POA: Diagnosis not present

## 2017-01-28 DIAGNOSIS — Z7984 Long term (current) use of oral hypoglycemic drugs: Secondary | ICD-10-CM | POA: Diagnosis not present

## 2017-02-01 DIAGNOSIS — J449 Chronic obstructive pulmonary disease, unspecified: Secondary | ICD-10-CM | POA: Diagnosis not present

## 2017-02-01 DIAGNOSIS — I10 Essential (primary) hypertension: Secondary | ICD-10-CM | POA: Diagnosis not present

## 2017-02-01 DIAGNOSIS — E119 Type 2 diabetes mellitus without complications: Secondary | ICD-10-CM | POA: Diagnosis not present

## 2017-02-01 DIAGNOSIS — Z9981 Dependence on supplemental oxygen: Secondary | ICD-10-CM | POA: Diagnosis not present

## 2017-02-01 DIAGNOSIS — Z7984 Long term (current) use of oral hypoglycemic drugs: Secondary | ICD-10-CM | POA: Diagnosis not present

## 2017-02-01 DIAGNOSIS — M069 Rheumatoid arthritis, unspecified: Secondary | ICD-10-CM | POA: Diagnosis not present

## 2017-02-01 DIAGNOSIS — G4733 Obstructive sleep apnea (adult) (pediatric): Secondary | ICD-10-CM | POA: Diagnosis not present

## 2017-02-01 DIAGNOSIS — I2510 Atherosclerotic heart disease of native coronary artery without angina pectoris: Secondary | ICD-10-CM | POA: Diagnosis not present

## 2017-02-01 DIAGNOSIS — M1991 Primary osteoarthritis, unspecified site: Secondary | ICD-10-CM | POA: Diagnosis not present

## 2017-02-01 DIAGNOSIS — I252 Old myocardial infarction: Secondary | ICD-10-CM | POA: Diagnosis not present

## 2017-02-01 DIAGNOSIS — Z7902 Long term (current) use of antithrombotics/antiplatelets: Secondary | ICD-10-CM | POA: Diagnosis not present

## 2017-02-04 DIAGNOSIS — I10 Essential (primary) hypertension: Secondary | ICD-10-CM | POA: Diagnosis not present

## 2017-02-08 DIAGNOSIS — I2510 Atherosclerotic heart disease of native coronary artery without angina pectoris: Secondary | ICD-10-CM | POA: Diagnosis not present

## 2017-02-08 DIAGNOSIS — G4733 Obstructive sleep apnea (adult) (pediatric): Secondary | ICD-10-CM | POA: Diagnosis not present

## 2017-02-08 DIAGNOSIS — J449 Chronic obstructive pulmonary disease, unspecified: Secondary | ICD-10-CM | POA: Diagnosis not present

## 2017-02-08 DIAGNOSIS — I252 Old myocardial infarction: Secondary | ICD-10-CM | POA: Diagnosis not present

## 2017-02-08 DIAGNOSIS — I10 Essential (primary) hypertension: Secondary | ICD-10-CM | POA: Diagnosis not present

## 2017-02-08 DIAGNOSIS — E119 Type 2 diabetes mellitus without complications: Secondary | ICD-10-CM | POA: Diagnosis not present

## 2017-02-08 DIAGNOSIS — M1991 Primary osteoarthritis, unspecified site: Secondary | ICD-10-CM | POA: Diagnosis not present

## 2017-02-08 DIAGNOSIS — Z7902 Long term (current) use of antithrombotics/antiplatelets: Secondary | ICD-10-CM | POA: Diagnosis not present

## 2017-02-08 DIAGNOSIS — M069 Rheumatoid arthritis, unspecified: Secondary | ICD-10-CM | POA: Diagnosis not present

## 2017-02-08 DIAGNOSIS — Z9981 Dependence on supplemental oxygen: Secondary | ICD-10-CM | POA: Diagnosis not present

## 2017-02-08 DIAGNOSIS — Z7984 Long term (current) use of oral hypoglycemic drugs: Secondary | ICD-10-CM | POA: Diagnosis not present

## 2017-02-09 ENCOUNTER — Telehealth (HOSPITAL_COMMUNITY): Payer: Self-pay | Admitting: *Deleted

## 2017-02-10 ENCOUNTER — Other Ambulatory Visit (HOSPITAL_COMMUNITY): Payer: Self-pay | Admitting: Psychiatry

## 2017-02-10 DIAGNOSIS — I252 Old myocardial infarction: Secondary | ICD-10-CM | POA: Diagnosis not present

## 2017-02-10 DIAGNOSIS — Z7984 Long term (current) use of oral hypoglycemic drugs: Secondary | ICD-10-CM | POA: Diagnosis not present

## 2017-02-10 DIAGNOSIS — Z7902 Long term (current) use of antithrombotics/antiplatelets: Secondary | ICD-10-CM | POA: Diagnosis not present

## 2017-02-10 DIAGNOSIS — I2510 Atherosclerotic heart disease of native coronary artery without angina pectoris: Secondary | ICD-10-CM | POA: Diagnosis not present

## 2017-02-10 DIAGNOSIS — I10 Essential (primary) hypertension: Secondary | ICD-10-CM | POA: Diagnosis not present

## 2017-02-10 DIAGNOSIS — E119 Type 2 diabetes mellitus without complications: Secondary | ICD-10-CM | POA: Diagnosis not present

## 2017-02-10 DIAGNOSIS — J449 Chronic obstructive pulmonary disease, unspecified: Secondary | ICD-10-CM | POA: Diagnosis not present

## 2017-02-10 DIAGNOSIS — M1991 Primary osteoarthritis, unspecified site: Secondary | ICD-10-CM | POA: Diagnosis not present

## 2017-02-10 DIAGNOSIS — Z9981 Dependence on supplemental oxygen: Secondary | ICD-10-CM | POA: Diagnosis not present

## 2017-02-10 DIAGNOSIS — M069 Rheumatoid arthritis, unspecified: Secondary | ICD-10-CM | POA: Diagnosis not present

## 2017-02-10 DIAGNOSIS — G4733 Obstructive sleep apnea (adult) (pediatric): Secondary | ICD-10-CM | POA: Diagnosis not present

## 2017-02-14 DIAGNOSIS — Z7902 Long term (current) use of antithrombotics/antiplatelets: Secondary | ICD-10-CM | POA: Diagnosis not present

## 2017-02-14 DIAGNOSIS — I10 Essential (primary) hypertension: Secondary | ICD-10-CM | POA: Diagnosis not present

## 2017-02-14 DIAGNOSIS — G4733 Obstructive sleep apnea (adult) (pediatric): Secondary | ICD-10-CM | POA: Diagnosis not present

## 2017-02-14 DIAGNOSIS — M069 Rheumatoid arthritis, unspecified: Secondary | ICD-10-CM | POA: Diagnosis not present

## 2017-02-14 DIAGNOSIS — I252 Old myocardial infarction: Secondary | ICD-10-CM | POA: Diagnosis not present

## 2017-02-14 DIAGNOSIS — M1991 Primary osteoarthritis, unspecified site: Secondary | ICD-10-CM | POA: Diagnosis not present

## 2017-02-14 DIAGNOSIS — Z7984 Long term (current) use of oral hypoglycemic drugs: Secondary | ICD-10-CM | POA: Diagnosis not present

## 2017-02-14 DIAGNOSIS — Z9981 Dependence on supplemental oxygen: Secondary | ICD-10-CM | POA: Diagnosis not present

## 2017-02-14 DIAGNOSIS — I2510 Atherosclerotic heart disease of native coronary artery without angina pectoris: Secondary | ICD-10-CM | POA: Diagnosis not present

## 2017-02-14 DIAGNOSIS — J449 Chronic obstructive pulmonary disease, unspecified: Secondary | ICD-10-CM | POA: Diagnosis not present

## 2017-02-14 DIAGNOSIS — E119 Type 2 diabetes mellitus without complications: Secondary | ICD-10-CM | POA: Diagnosis not present

## 2017-02-21 DIAGNOSIS — J449 Chronic obstructive pulmonary disease, unspecified: Secondary | ICD-10-CM | POA: Diagnosis not present

## 2017-02-21 DIAGNOSIS — M1991 Primary osteoarthritis, unspecified site: Secondary | ICD-10-CM | POA: Diagnosis not present

## 2017-02-21 DIAGNOSIS — Z9981 Dependence on supplemental oxygen: Secondary | ICD-10-CM | POA: Diagnosis not present

## 2017-02-21 DIAGNOSIS — I2510 Atherosclerotic heart disease of native coronary artery without angina pectoris: Secondary | ICD-10-CM | POA: Diagnosis not present

## 2017-02-21 DIAGNOSIS — I252 Old myocardial infarction: Secondary | ICD-10-CM | POA: Diagnosis not present

## 2017-02-21 DIAGNOSIS — G4733 Obstructive sleep apnea (adult) (pediatric): Secondary | ICD-10-CM | POA: Diagnosis not present

## 2017-02-21 DIAGNOSIS — E119 Type 2 diabetes mellitus without complications: Secondary | ICD-10-CM | POA: Diagnosis not present

## 2017-02-21 DIAGNOSIS — Z7902 Long term (current) use of antithrombotics/antiplatelets: Secondary | ICD-10-CM | POA: Diagnosis not present

## 2017-02-21 DIAGNOSIS — I10 Essential (primary) hypertension: Secondary | ICD-10-CM | POA: Diagnosis not present

## 2017-02-21 DIAGNOSIS — Z7984 Long term (current) use of oral hypoglycemic drugs: Secondary | ICD-10-CM | POA: Diagnosis not present

## 2017-02-21 DIAGNOSIS — M069 Rheumatoid arthritis, unspecified: Secondary | ICD-10-CM | POA: Diagnosis not present

## 2017-02-22 DIAGNOSIS — J449 Chronic obstructive pulmonary disease, unspecified: Secondary | ICD-10-CM | POA: Diagnosis not present

## 2017-03-01 ENCOUNTER — Ambulatory Visit (INDEPENDENT_AMBULATORY_CARE_PROVIDER_SITE_OTHER): Payer: Medicare Other | Admitting: Psychiatry

## 2017-03-01 ENCOUNTER — Encounter (HOSPITAL_COMMUNITY): Payer: Self-pay | Admitting: Psychiatry

## 2017-03-01 VITALS — BP 128/74 | HR 108 | Ht 70.0 in | Wt 208.0 lb

## 2017-03-01 DIAGNOSIS — F330 Major depressive disorder, recurrent, mild: Secondary | ICD-10-CM

## 2017-03-01 DIAGNOSIS — R251 Tremor, unspecified: Secondary | ICD-10-CM

## 2017-03-01 DIAGNOSIS — R5383 Other fatigue: Secondary | ICD-10-CM

## 2017-03-01 DIAGNOSIS — Z87891 Personal history of nicotine dependence: Secondary | ICD-10-CM

## 2017-03-01 DIAGNOSIS — R0602 Shortness of breath: Secondary | ICD-10-CM | POA: Diagnosis not present

## 2017-03-01 DIAGNOSIS — Z818 Family history of other mental and behavioral disorders: Secondary | ICD-10-CM | POA: Diagnosis not present

## 2017-03-01 MED ORDER — BUSPIRONE HCL 5 MG PO TABS: 5.0000 mg | ORAL_TABLET | Freq: Two times a day (BID) | ORAL | 0 refills | Status: DC

## 2017-03-01 MED ORDER — MIRTAZAPINE 45 MG PO TABS: 45.0000 mg | ORAL_TABLET | Freq: Every day | ORAL | 0 refills | Status: DC

## 2017-03-01 MED ORDER — AMITRIPTYLINE HCL 50 MG PO TABS: 50.0000 mg | ORAL_TABLET | Freq: Every day | ORAL | 0 refills | Status: DC

## 2017-03-01 NOTE — Progress Notes (Signed)
[place] MD/PA/NP OP Progress Note  03/01/2017 3:23 PM [name]  MRN:  [id number]  Chief Complaint:  I am sleeping better with amitriptyline.  HPI: Patient came for his follow-up appointment.  On his last visit we added Cymbalta but he could not afford it and then we tried low-dose amitriptyline.  He sleeping better.  He is less anxious and less irritable.  However he continued to endorse fatigue, tired and withdrawn.  Patient has lot of health issues.  He is taking multiple medication and he also have tingling, erectile dysfunction and generalized weakness.  He is frustrated because he cannot afford AndroGel which was recommended by his primary care physician.  He wants to know if any medication causing sexual side effects.  Patient has been complaining of sexual side effects for a while and he has been getting Viagra and Cialis from his primary care physician.  He does not want to change his medication.  He denies drinking alcohol or using any illegal substances.  He using CPAP machine.  He denies any major panic attack and he feel BuSpar and Remeron helping his sleep and anxiety.  Patient has no tremors shakes or any EPS.  His appetite is okay.  Visit Diagnosis:    ICD-10-CM   1. Major depressive disorder, recurrent episode, mild (HCC) F33.0 mirtazapine (REMERON) 45 MG tablet    busPIRone (BUSPAR) 5 MG tablet    amitriptyline (ELAVIL) 50 MG tablet    Past Psychiatric History: Reviewed. Patient has history of depression since 72. He has one suicidal attempt . In the past he had tried Zoloft, Paxil, Prozac, Vistaril, trazodone and Wellbutrin but he stopped due to side effects. Patient has significant history of using cocaine marijuana and alcohol for 20 years. He claims to be sober since he's been coming to this office.   Past Medical History:  Past Medical History:  Diagnosis Date  . Anxiety   . Arthritis   . COPD (chronic obstructive pulmonary disease) ([place])   . Coronary artery  disease    x4  . Depression   . Diabetes mellitus without complication ([place])   . GERD (gastroesophageal reflux disease)    tums will relieve  . HLD (hyperlipidemia)   . HTN (hypertension)   . Myocardial infarction ([place])    x2  . Obstructive sleep apnea    sleep study 2006, uses CPAP, pt does not know cpap settings  . On home oxygen therapy    2L via nasal cannula prn  . Shortness of breath    exertion    Past Surgical History:  Procedure Laterality Date  . CARDIAC CATHETERIZATION  2003,2010   x4   . COLONOSCOPY WITH PROPOFOL N/A 08/30/2014   Procedure: COLONOSCOPY WITH PROPOFOL;  Surgeon: Carol Ada, MD;  Location: [facility];  Service: Endoscopy;  Laterality: N/A;  h&p in file   . CORONARY ANGIOPLASTY     stents x 4  . DENTAL SURGERY    . TOTAL HIP ARTHROPLASTY  04/06/2012   Procedure: TOTAL HIP ARTHROPLASTY;  Surgeon: Sharmon Revere, MD;  Location: [facility];  Service: Orthopedics;  Laterality: Right;  . TOTAL HIP ARTHROPLASTY Left 04/03/2013   Procedure: LEFT TOTAL HIP ARTHROPLASTY;  Surgeon: Sharmon Revere, MD;  Location: [facility];  Service: Orthopedics;  Laterality: Left;    Family Psychiatric History: Deferred.  Family History:  Family History  Problem Relation Age of Onset  . Depression Mother   . Heart disease Father   . Emphysema Father  smoked    Social History:  Social History   Social History  . Marital status: Married    Spouse name: N/A  . Number of children: N/A  . Years of education: N/A   Social History Main Topics  . Smoking status: Former Smoker    Packs/day: 0.25    Years: 45.00    Types: Cigarettes    Quit date: 11/24/2014  . Smokeless tobacco: Never Used  . Alcohol use No     Comment: Reports drinks a beer maybe 4 times a year.  . Drug use: No  . Sexual activity: Yes    Partners: Female    Birth control/ protection: None   Other Topics Concern  . Not on file   Social History Narrative  . No narrative on file     Allergies: No Known Allergies  Metabolic Disorder Labs: Lab Results  Component Value Date   HGBA1C  06/22/2008    5.7 (NOTE)   The ADA recommends the following therapeutic goal for glycemic   control related to Hgb A1C measurement:   Goal of Therapy:   < 7.0% Hgb A1C   Reference: American Diabetes Association: Clinical Practice   Recommendations 2008, Diabetes Care,  2008, 31:(Suppl 1).   MPG 117 06/22/2008   No results found for: PROLACTIN Lab Results  Component Value Date   CHOL  06/23/2008    174        ATP III CLASSIFICATION:  <200     mg/dL   Desirable  200-239  mg/dL   Borderline High  >=240    mg/dL   High          TRIG 41 06/23/2008   HDL 46 06/23/2008   CHOLHDL 3.8 06/23/2008   VLDL 8 06/23/2008   LDLCALC (H) 06/23/2008    120        Total Cholesterol/HDL:CHD Risk Coronary Heart Disease Risk Table                     Men   Women  1/2 Average Risk   3.4   3.3  Average Risk       5.0   4.4  2 X Average Risk   9.6   7.1  3 X Average Risk  23.4   11.0        Use the calculated Patient Ratio above and the CHD Risk Table to determine the patient's CHD Risk.        ATP III CLASSIFICATION (LDL):  <100     mg/dL   Optimal  100-129  mg/dL   Near or Above                    Optimal  130-159  mg/dL   Borderline  160-189  mg/dL   High  >190     mg/dL   Very High   Lab Results  Component Value Date   TSH 1.958 Test methodology is 3rd generation TSH 12/07/2008   TSH 1.137 Test methodology is 3rd generation TSH 06/24/2008    Therapeutic Level Labs: No results found for: LITHIUM No results found for: VALPROATE No components found for:  CBMZ  Current Medications: Current Outpatient Prescriptions  Medication Sig Dispense Refill  . albuterol (PROAIR HFA) 108 (90 Base) MCG/ACT inhaler 2 puffs every 4 hours as needed only  if your can't catch your breath 3 Inhaler 1  . amitriptyline (ELAVIL) 50 MG tablet Take 1 tablet (50 mg total) by  mouth at bedtime. 90 tablet  0  . amLODipine (NORVASC) 5 MG tablet Take 5 mg by mouth at bedtime.     Marland Kitchen aspirin EC 81 MG tablet Take 81 mg by mouth at bedtime.    Mar[place] Kitchen atenolol (TENORMIN) 25 MG tablet Take 25 mg by mouth at bedtime.     . budesonide-formoterol (SYMBICORT) 160-4.5 MCG/ACT inhaler Inhale 2 puffs into the lungs 2 (two) times daily. 2 Inhaler 0  . busPIRone (BUSPAR) 5 MG tablet Take 1 tablet (5 mg total) by mouth 2 (two) times daily. 180 tablet 0  . clopidogrel (PLAVIX) 75 MG tablet Take 75 mg by mouth at bedtime.     . Coenzyme Q10 (COQ10) 100 MG CAPS Take 1 capsule by mouth at bedtime.     Marland Kitchen dextromethorphan-guaiFENesin (MUCINEX DM) 30-600 MG 12hr tablet Take 1 tablet by mouth 2 (two) times daily.    . metFORMIN (GLUCOPHAGE) 500 MG tablet 2 tabs by mouth once daily at bedtime    . mirtazapine (REMERON) 45 MG tablet Take 1 tablet (45 mg total) by mouth at bedtime. 90 tablet 0  . Multiple Vitamin (MULTIVITAMIN WITH MINERALS) TABS Take 1 tablet by mouth at bedtime.     . naproxen (NAPROSYN) 500 MG tablet Take 500 mg by mouth 2 (two) times daily as needed.    . nitroGLYCERIN (NITROSTAT) 0.4 MG SL tablet Place 0.4 mg under the tongue every 5 (five) minutes as needed for chest pain.    . OXYGEN Inhale 2-3 L into the lungs. With exertion only    . pravastatin (PRAVACHOL) 40 MG tablet Take 40 mg by mouth at bedtime.     . sildenafil (VIAGRA) 25 MG tablet Take 25 mg by mouth See admin instructions. Use as directed    . sodium chloride (OCEAN) 0.65 % SOLN nasal spray Place 1 spray into both nostrils as needed for congestion.    . Tiotropium Bromide Monohydrate (SPIRIVA RESPIMAT) 2.5 MCG/ACT AERS Inhale 2 puffs into the lungs every morning. 2 Inhaler 0   No current facility-administered medications for this visit.      Musculoskeletal: Strength & Muscle Tone: decreased Gait & Station: unsteady Patient leans: using wheel chair  Psychiatric Specialty Exam: Review of Systems  Respiratory: Positive for shortness of  breath.        On oxygen  Neurological: Positive for tremors.    Blood pressure 128/74, pulse (!) 108, height 5\' 10"  (1.778 m), weight 208 lb (94.3 kg).There is no height or weight on file to calculate BMI.  General Appearance: Fairly Groomed  Eye Contact:  Fair  Speech:  Slow  Volume:  Normal  Mood:  Euthymic  Affect:  Appropriate  Thought Process:  Goal Directed  Orientation:  Full (Time, Place, and Person)  Thought Content: Rumination   Suicidal Thoughts:  No  Homicidal Thoughts:  No  Memory:  Immediate;   Fair Recent;   Fair Remote;   Fair  Judgement:  Good  Insight:  Fair  Psychomotor Activity:  Decreased  Concentration:  Concentration: Fair and Attention Span: Fair  Recall:  [employer] of Knowledge: Fair  Language: Fair  Akathisia:  No  Handed:  Right  AIMS (if indicated): not done  Assets:  Communication Skills Desire for Improvement Housing Social Support  ADL's:  Intact  Cognition: WNL  Sleep:  Good   Screenings:   Assessment and Plan: Major depressive disorder, recurrent.  Patient explained that sexual side effects could be neuropathy and he should  consult with his primary care physician.  Even though amitriptyline can cause sexual side effects but he just started taking a few weeks ago and he has erectile dysfunction for a long time.  Patient does not want to change his amitriptyline because it is helping his sleep, pain and anxiety.  Continue amitriptyline 50 mg at bedtime, BuSpar 5 mg twice a day and Remeron 45 mg at bedtime.  Recommended to call us back if he has any question or any concern.  Follow-up in 3 months.   [facility] T., MD 03/01/2017, 3:23 PM

## 2017-03-08 DIAGNOSIS — E119 Type 2 diabetes mellitus without complications: Secondary | ICD-10-CM | POA: Diagnosis not present

## 2017-03-08 DIAGNOSIS — Z7984 Long term (current) use of oral hypoglycemic drugs: Secondary | ICD-10-CM | POA: Diagnosis not present

## 2017-03-08 DIAGNOSIS — G4733 Obstructive sleep apnea (adult) (pediatric): Secondary | ICD-10-CM | POA: Diagnosis not present

## 2017-03-08 DIAGNOSIS — I2510 Atherosclerotic heart disease of native coronary artery without angina pectoris: Secondary | ICD-10-CM | POA: Diagnosis not present

## 2017-03-08 DIAGNOSIS — J449 Chronic obstructive pulmonary disease, unspecified: Secondary | ICD-10-CM | POA: Diagnosis not present

## 2017-03-08 DIAGNOSIS — I10 Essential (primary) hypertension: Secondary | ICD-10-CM | POA: Diagnosis not present

## 2017-03-08 DIAGNOSIS — Z7902 Long term (current) use of antithrombotics/antiplatelets: Secondary | ICD-10-CM | POA: Diagnosis not present

## 2017-03-08 DIAGNOSIS — Z9981 Dependence on supplemental oxygen: Secondary | ICD-10-CM | POA: Diagnosis not present

## 2017-03-08 DIAGNOSIS — I252 Old myocardial infarction: Secondary | ICD-10-CM | POA: Diagnosis not present

## 2017-03-08 DIAGNOSIS — M069 Rheumatoid arthritis, unspecified: Secondary | ICD-10-CM | POA: Diagnosis not present

## 2017-03-08 DIAGNOSIS — M1991 Primary osteoarthritis, unspecified site: Secondary | ICD-10-CM | POA: Diagnosis not present

## 2017-03-10 DIAGNOSIS — M1991 Primary osteoarthritis, unspecified site: Secondary | ICD-10-CM | POA: Diagnosis not present

## 2017-03-10 DIAGNOSIS — M069 Rheumatoid arthritis, unspecified: Secondary | ICD-10-CM | POA: Diagnosis not present

## 2017-03-10 DIAGNOSIS — G4733 Obstructive sleep apnea (adult) (pediatric): Secondary | ICD-10-CM | POA: Diagnosis not present

## 2017-03-10 DIAGNOSIS — I2510 Atherosclerotic heart disease of native coronary artery without angina pectoris: Secondary | ICD-10-CM | POA: Diagnosis not present

## 2017-03-10 DIAGNOSIS — Z7902 Long term (current) use of antithrombotics/antiplatelets: Secondary | ICD-10-CM | POA: Diagnosis not present

## 2017-03-10 DIAGNOSIS — Z9981 Dependence on supplemental oxygen: Secondary | ICD-10-CM | POA: Diagnosis not present

## 2017-03-10 DIAGNOSIS — J449 Chronic obstructive pulmonary disease, unspecified: Secondary | ICD-10-CM | POA: Diagnosis not present

## 2017-03-10 DIAGNOSIS — I10 Essential (primary) hypertension: Secondary | ICD-10-CM | POA: Diagnosis not present

## 2017-03-10 DIAGNOSIS — Z7984 Long term (current) use of oral hypoglycemic drugs: Secondary | ICD-10-CM | POA: Diagnosis not present

## 2017-03-10 DIAGNOSIS — I252 Old myocardial infarction: Secondary | ICD-10-CM | POA: Diagnosis not present

## 2017-03-10 DIAGNOSIS — E119 Type 2 diabetes mellitus without complications: Secondary | ICD-10-CM | POA: Diagnosis not present

## 2017-03-17 DIAGNOSIS — Z7984 Long term (current) use of oral hypoglycemic drugs: Secondary | ICD-10-CM | POA: Diagnosis not present

## 2017-03-17 DIAGNOSIS — Z7902 Long term (current) use of antithrombotics/antiplatelets: Secondary | ICD-10-CM | POA: Diagnosis not present

## 2017-03-17 DIAGNOSIS — I252 Old myocardial infarction: Secondary | ICD-10-CM | POA: Diagnosis not present

## 2017-03-17 DIAGNOSIS — G4733 Obstructive sleep apnea (adult) (pediatric): Secondary | ICD-10-CM | POA: Diagnosis not present

## 2017-03-17 DIAGNOSIS — I10 Essential (primary) hypertension: Secondary | ICD-10-CM | POA: Diagnosis not present

## 2017-03-17 DIAGNOSIS — E119 Type 2 diabetes mellitus without complications: Secondary | ICD-10-CM | POA: Diagnosis not present

## 2017-03-17 DIAGNOSIS — M1991 Primary osteoarthritis, unspecified site: Secondary | ICD-10-CM | POA: Diagnosis not present

## 2017-03-17 DIAGNOSIS — J449 Chronic obstructive pulmonary disease, unspecified: Secondary | ICD-10-CM | POA: Diagnosis not present

## 2017-03-17 DIAGNOSIS — M069 Rheumatoid arthritis, unspecified: Secondary | ICD-10-CM | POA: Diagnosis not present

## 2017-03-17 DIAGNOSIS — I2510 Atherosclerotic heart disease of native coronary artery without angina pectoris: Secondary | ICD-10-CM | POA: Diagnosis not present

## 2017-03-17 DIAGNOSIS — Z9981 Dependence on supplemental oxygen: Secondary | ICD-10-CM | POA: Diagnosis not present

## 2017-03-22 DIAGNOSIS — I2510 Atherosclerotic heart disease of native coronary artery without angina pectoris: Secondary | ICD-10-CM | POA: Diagnosis not present

## 2017-03-22 DIAGNOSIS — M069 Rheumatoid arthritis, unspecified: Secondary | ICD-10-CM | POA: Diagnosis not present

## 2017-03-22 DIAGNOSIS — Z9981 Dependence on supplemental oxygen: Secondary | ICD-10-CM | POA: Diagnosis not present

## 2017-03-22 DIAGNOSIS — I252 Old myocardial infarction: Secondary | ICD-10-CM | POA: Diagnosis not present

## 2017-03-22 DIAGNOSIS — E119 Type 2 diabetes mellitus without complications: Secondary | ICD-10-CM | POA: Diagnosis not present

## 2017-03-22 DIAGNOSIS — G4733 Obstructive sleep apnea (adult) (pediatric): Secondary | ICD-10-CM | POA: Diagnosis not present

## 2017-03-22 DIAGNOSIS — Z7984 Long term (current) use of oral hypoglycemic drugs: Secondary | ICD-10-CM | POA: Diagnosis not present

## 2017-03-22 DIAGNOSIS — M1991 Primary osteoarthritis, unspecified site: Secondary | ICD-10-CM | POA: Diagnosis not present

## 2017-03-22 DIAGNOSIS — Z7902 Long term (current) use of antithrombotics/antiplatelets: Secondary | ICD-10-CM | POA: Diagnosis not present

## 2017-03-22 DIAGNOSIS — J449 Chronic obstructive pulmonary disease, unspecified: Secondary | ICD-10-CM | POA: Diagnosis not present

## 2017-03-22 DIAGNOSIS — I10 Essential (primary) hypertension: Secondary | ICD-10-CM | POA: Diagnosis not present

## 2017-03-25 DIAGNOSIS — J449 Chronic obstructive pulmonary disease, unspecified: Secondary | ICD-10-CM | POA: Diagnosis not present

## 2017-03-31 ENCOUNTER — Other Ambulatory Visit (HOSPITAL_COMMUNITY): Payer: Self-pay | Admitting: Psychiatry

## 2017-03-31 DIAGNOSIS — F330 Major depressive disorder, recurrent, mild: Secondary | ICD-10-CM

## 2017-04-13 ENCOUNTER — Other Ambulatory Visit (HOSPITAL_COMMUNITY): Payer: Self-pay | Admitting: Psychiatry

## 2017-04-19 DIAGNOSIS — G4730 Sleep apnea, unspecified: Secondary | ICD-10-CM | POA: Diagnosis not present

## 2017-04-19 DIAGNOSIS — G4733 Obstructive sleep apnea (adult) (pediatric): Secondary | ICD-10-CM | POA: Diagnosis not present

## 2017-04-20 ENCOUNTER — Telehealth: Payer: Self-pay | Admitting: Internal Medicine

## 2017-04-20 MED ORDER — MOMETASONE FURO-FORMOTEROL FUM 200-5 MCG/ACT IN AERO: 2.0000 | INHALATION_SPRAY | Freq: Two times a day (BID) | RESPIRATORY_TRACT | 3 refills | Status: DC

## 2017-04-20 NOTE — Telephone Encounter (Signed)
Forms located in [facility] folder up front and placed in Leslie's/MW's inbox on A-side Routing to [place] for follow up

## 2017-04-20 NOTE — Telephone Encounter (Signed)
Pt aware of recs per MW  I have mailed forms and rx for [facility] to [employer]  Pt aware and I made a copy for him to pick up

## 2017-04-20 NOTE — Telephone Encounter (Signed)
Pt asking for forms to be filled out for [facility], however, his med list states he is on Symbicort  MW- please advise if okay to fill out Thanks     Goal with 02 is to keep the saturation over 90% so ok to increase  with exertion to maintain this sat   Ok to try the proair x 2 pffs  15 min prior to exercise to see if there is any benefit    Work on inhaler technique:  relax and gently blow all the way out then take a nice smooth deep breath back in, triggering the inhaler at same time you start breathing in.  Hold for up to 5 seconds if you can. Blow out thru nose. Rinse and gargle with water when done     See calendar for specific medication instructions and bring it back for each and every office visit for every healthcare provider you see.  Without it,  you may not receive the best quality medical care that we feel you deserve.   You will note that the calendar groups together  your maintenance  medications that are timed at particular times of the day.  Think of this as your checklist for what your doctor has instructed you to do until your next evaluation to see what benefit  there is  to staying on a consistent group of medications intended to keep you well.  The other group at the bottom is entirely up to you to use as you see fit  for specific symptoms that may arise between visits that require you to treat them on an as needed basis.  Think of this as your action plan or "what if" list.    Separating the top medications from the bottom group is fundamental to providing you adequate care going forward.       Please remember to go to the  x-ray department downstairs in the basement  for your tests - we will call you with the results when they are available.       Please schedule a follow up visit in 6  months but call sooner if needed

## 2017-04-20 NOTE — Telephone Encounter (Signed)
As long as he understands dulera 200 = symbicort 160 and they are interchangeable that's fine but he should not use both at the same time

## 2017-04-24 DIAGNOSIS — J449 Chronic obstructive pulmonary disease, unspecified: Secondary | ICD-10-CM | POA: Diagnosis not present

## 2017-05-23 DIAGNOSIS — G4733 Obstructive sleep apnea (adult) (pediatric): Secondary | ICD-10-CM | POA: Diagnosis not present

## 2017-05-23 DIAGNOSIS — G4730 Sleep apnea, unspecified: Secondary | ICD-10-CM | POA: Diagnosis not present

## 2017-05-25 DIAGNOSIS — J449 Chronic obstructive pulmonary disease, unspecified: Secondary | ICD-10-CM | POA: Diagnosis not present

## 2017-05-31 ENCOUNTER — Ambulatory Visit (HOSPITAL_COMMUNITY): Payer: Self-pay | Admitting: Psychiatry

## 2017-06-17 DIAGNOSIS — R0600 Dyspnea, unspecified: Secondary | ICD-10-CM | POA: Diagnosis not present

## 2017-06-17 DIAGNOSIS — I252 Old myocardial infarction: Secondary | ICD-10-CM | POA: Diagnosis not present

## 2017-06-17 DIAGNOSIS — I2510 Atherosclerotic heart disease of native coronary artery without angina pectoris: Secondary | ICD-10-CM | POA: Diagnosis not present

## 2017-06-22 ENCOUNTER — Other Ambulatory Visit: Payer: Self-pay | Admitting: Gastroenterology

## 2017-06-22 DIAGNOSIS — Z86010 Personal history of colonic polyps: Secondary | ICD-10-CM | POA: Diagnosis not present

## 2017-06-22 DIAGNOSIS — K625 Hemorrhage of anus and rectum: Secondary | ICD-10-CM | POA: Diagnosis not present

## 2017-06-22 DIAGNOSIS — K5730 Diverticulosis of large intestine without perforation or abscess without bleeding: Secondary | ICD-10-CM | POA: Diagnosis not present

## 2017-06-22 DIAGNOSIS — I2510 Atherosclerotic heart disease of native coronary artery without angina pectoris: Secondary | ICD-10-CM | POA: Diagnosis not present

## 2017-06-22 DIAGNOSIS — J449 Chronic obstructive pulmonary disease, unspecified: Secondary | ICD-10-CM | POA: Diagnosis not present

## 2017-06-25 DIAGNOSIS — J449 Chronic obstructive pulmonary disease, unspecified: Secondary | ICD-10-CM | POA: Diagnosis not present

## 2017-07-01 ENCOUNTER — Ambulatory Visit: Payer: Self-pay | Admitting: Internal Medicine

## 2017-07-01 ENCOUNTER — Telehealth: Payer: Self-pay | Admitting: Internal Medicine

## 2017-07-01 NOTE — Telephone Encounter (Signed)
Hard to know how to help either problem over the phone so needs to be seen by UC or PCP and if getting worse to ER

## 2017-07-01 NOTE — Telephone Encounter (Signed)
MW please advise, should patient be seen in the ER or is there something we can do for him. He also asked if he should contact his PCP. Thanks

## 2017-07-01 NOTE — Telephone Encounter (Signed)
Pt is aware of below message and voiced his understanding. Nothing further is needed.  

## 2017-07-04 ENCOUNTER — Encounter (HOSPITAL_COMMUNITY): Payer: Self-pay | Admitting: Psychiatry

## 2017-07-04 ENCOUNTER — Ambulatory Visit (INDEPENDENT_AMBULATORY_CARE_PROVIDER_SITE_OTHER): Payer: Medicare Other | Admitting: Psychiatry

## 2017-07-04 DIAGNOSIS — Z915 Personal history of self-harm: Secondary | ICD-10-CM

## 2017-07-04 DIAGNOSIS — F419 Anxiety disorder, unspecified: Secondary | ICD-10-CM | POA: Diagnosis not present

## 2017-07-04 DIAGNOSIS — R0602 Shortness of breath: Secondary | ICD-10-CM

## 2017-07-04 DIAGNOSIS — N529 Male erectile dysfunction, unspecified: Secondary | ICD-10-CM

## 2017-07-04 DIAGNOSIS — G8929 Other chronic pain: Secondary | ICD-10-CM

## 2017-07-04 DIAGNOSIS — F330 Major depressive disorder, recurrent, mild: Secondary | ICD-10-CM | POA: Diagnosis not present

## 2017-07-04 DIAGNOSIS — Z87891 Personal history of nicotine dependence: Secondary | ICD-10-CM

## 2017-07-04 MED ORDER — AMITRIPTYLINE HCL 50 MG PO TABS: 50.0000 mg | ORAL_TABLET | Freq: Every day | ORAL | 0 refills | Status: DC

## 2017-07-04 MED ORDER — MIRTAZAPINE 45 MG PO TABS: 45.0000 mg | ORAL_TABLET | Freq: Every day | ORAL | 0 refills | Status: DC

## 2017-07-04 MED ORDER — BUSPIRONE HCL 5 MG PO TABS: 5.0000 mg | ORAL_TABLET | Freq: Two times a day (BID) | ORAL | 0 refills | Status: DC

## 2017-07-04 NOTE — Progress Notes (Signed)
[facility] MD/PA/NP OP Progress Note  07/04/2017 1:27 PM [name]  MRN:  [id number]  Chief Complaint: I like amitriptyline.  It is helping my depression, chronic pain and sleep.  HPI: Patient came for his follow-up appointment with his wife.  He is now taking amitriptyline 50 mg at bedtime.  We tried Cymbalta but he could not afford the co-pay.  He is also taking low-dose testosterone for his erectile dysfunction.  Patient has multiple health issues and he is taking multiple medication.  He has tingling, erectile dysfunction, generalized weakness and chronic pain.  He is not getting any pain medication from his primary care physician and he is not happy about it.  But he feels that the amitriptyline helps some of his pain.  Sometime he has a dizziness while standing but usually he uses wheelchair for ambulation.  His wife also endorsed much improvement in his sleep and depression.  He denies any crying spells, irritability, feeling of hopelessness or worthlessness.  Patient schedule for colonoscopy.  He recently seen his cardiologist and is happy that he does not have to come back for at least a year.  Patient has no tremors shakes or any EPS.  He denies drinking alcohol or using any illegal substances.  He is using CPAP machine.  He denies any major panic attack and he feels BuSpar and Remeron helping his anxiety.  His energy level is fair.  Visit Diagnosis:    ICD-10-CM   1. Major depressive disorder, recurrent episode, mild (HCC) F33.0 busPIRone (BUSPAR) 5 MG tablet    mirtazapine (REMERON) 45 MG tablet    amitriptyline (ELAVIL) 50 MG tablet    Past Psychiatric History: Updated. Patient has history of depression since 79. He has one suicidal attempt . In the past he had tried Zoloft,Paxil,Prozac, Vistaril, trazodoneand Wellbutrin but he stopped due to side effects. Patient has significant history of using cocaine marijuana and alcohol for 20 years. He claims to be sober since he's been  coming to this office.   Past Medical History:  Past Medical History:  Diagnosis Date  . Anxiety   . Arthritis   . COPD (chronic obstructive pulmonary disease) ([place])   . Coronary artery disease    x4  . Depression   . Diabetes mellitus without complication ([place])   . GERD (gastroesophageal reflux disease)    tums will relieve  . HLD (hyperlipidemia)   . HTN (hypertension)   . Myocardial infarction ([place])    x2  . Obstructive sleep apnea    sleep study 2006, uses CPAP, pt does not know cpap settings  . On home oxygen therapy    2L via nasal cannula prn  . Shortness of breath    exertion    Past Surgical History:  Procedure Laterality Date  . CARDIAC CATHETERIZATION  2003,2010   x4   . COLONOSCOPY WITH PROPOFOL N/A 08/30/2014   Procedure: COLONOSCOPY WITH PROPOFOL;  Surgeon: Carol Ada, MD;  Location: [place];  Service: Endoscopy;  Laterality: N/A;  h&p in file   . CORONARY ANGIOPLASTY     stents x 4  . DENTAL SURGERY    . TOTAL HIP ARTHROPLASTY  04/06/2012   Procedure: TOTAL HIP ARTHROPLASTY;  Surgeon: Sharmon Revere, MD;  Location: [place];  Service: Orthopedics;  Laterality: Right;  . TOTAL HIP ARTHROPLASTY Left 04/03/2013   Procedure: LEFT TOTAL HIP ARTHROPLASTY;  Surgeon: Sharmon Revere, MD;  Location: [place];  Service: Orthopedics;  Laterality: Left;  Family Psychiatric History: Updated.  Family History:  Family History  Problem Relation Age of Onset  . Depression Mother   . Heart disease Father   . Emphysema Father        smoked    Social History:  Social History   Socioeconomic History  . Marital status: Married    Spouse name: None  . Number of children: None  . Years of education: None  . Highest education level: None  Social Needs  . Financial resource strain: None  . Food insecurity - worry: None  . Food insecurity - inability: None  . Transportation needs - medical: None  . Transportation needs - non-medical: None  Occupational  History  . None  Tobacco Use  . Smoking status: Former Smoker    Packs/day: 0.25    Years: 45.00    Pack years: 11.25    Types: Cigarettes    Last attempt to quit: 11/24/2014    Years since quitting: 2.6  . Smokeless tobacco: Never Used  Substance and Sexual Activity  . Alcohol use: No    Alcohol/week: 0.0 oz    Comment: Reports drinks a beer maybe 4 times a year.  . Drug use: No  . Sexual activity: Yes    Partners: Female    Birth control/protection: None  Other Topics Concern  . None  Social History Narrative  . None    Allergies: No Known Allergies  Metabolic Disorder Labs: Lab Results  Component Value Date   HGBA1C  06/22/2008    5.7 (NOTE)   The ADA recommends the following therapeutic goal for glycemic   control related to Hgb A1C measurement:   Goal of Therapy:   < 7.0% Hgb A1C   Reference: American Diabetes Association: Clinical Practice   Recommendations 2008, Diabetes Care,  2008, 31:(Suppl 1).   MPG 117 06/22/2008   No results found for: PROLACTIN Lab Results  Component Value Date   CHOL  06/23/2008    174        ATP III CLASSIFICATION:  <200     mg/dL   Desirable  200-239  mg/dL   Borderline High  >=240    mg/dL   High          TRIG 41 06/23/2008   HDL 46 06/23/2008   CHOLHDL 3.8 06/23/2008   VLDL 8 06/23/2008   LDLCALC (H) 06/23/2008    120        Total Cholesterol/HDL:CHD Risk Coronary Heart Disease Risk Table                     Men   Women  1/2 Average Risk   3.4   3.3  Average Risk       5.0   4.4  2 X Average Risk   9.6   7.1  3 X Average Risk  23.4   11.0        Use the calculated Patient Ratio above and the CHD Risk Table to determine the patient's CHD Risk.        ATP III CLASSIFICATION (LDL):  <100     mg/dL   Optimal  100-129  mg/dL   Near or Above                    Optimal  130-159  mg/dL   Borderline  160-189  mg/dL   High  >190     mg/dL   Very High   Lab Results  Component Value Date   TSH 1.958 Test methodology is  3rd generation TSH 12/07/2008   TSH 1.137 Test methodology is 3rd generation TSH 06/24/2008    Therapeutic Level Labs: No results found for: LITHIUM No results found for: VALPROATE No components found for:  CBMZ  Current Medications: Current Outpatient Medications  Medication Sig Dispense Refill  . albuterol (PROAIR HFA) 108 (90 Base) MCG/ACT inhaler 2 puffs every 4 hours as needed only  if your can't catch your breath 3 Inhaler 1  . amitriptyline (ELAVIL) 50 MG tablet Take 1 tablet (50 mg total) by mouth at bedtime. 90 tablet 0  . amLODipine (NORVASC) 5 MG tablet Take 5 mg by mouth at bedtime.     Marland Kitchen aspirin EC 81 MG tablet Take 81 mg by mouth at bedtime.    Marland Kitchen atenolol (TENORMIN) 25 MG tablet Take 25 mg by mouth at bedtime.     . busPIRone (BUSPAR) 5 MG tablet Take 1 tablet (5 mg total) by mouth 2 (two) times daily. 180 tablet 0  . clopidogrel (PLAVIX) 75 MG tablet Take 75 mg by mouth at bedtime.     . Coenzyme Q10 (COQ10) 100 MG CAPS Take 1 capsule by mouth at bedtime.     Marland Kitchen dextromethorphan-guaiFENesin (MUCINEX DM) 30-600 MG 12hr tablet Take 1 tablet by mouth 2 (two) times daily as needed for cough.     . metFORMIN (GLUCOPHAGE) 500 MG tablet Take 1,000 mg by mouth at bedtime. 2 tabs by mouth once daily at bedtime    . mirtazapine (REMERON) 45 MG tablet Take 1 tablet (45 mg total) by mouth at bedtime. 90 tablet 0  . mometasone-formoterol (DULERA) 200-5 MCG/ACT AERO Inhale 2 puffs into the lungs 2 (two) times daily. 3 Inhaler 3  . Multiple Vitamin (MULTIVITAMIN WITH MINERALS) TABS Take 1 tablet by mouth at bedtime.     . nitroGLYCERIN (NITROSTAT) 0.4 MG SL tablet Place 0.4 mg under the tongue every 5 (five) minutes as needed for chest pain.    . Omega-3 Fatty Acids (FISH OIL) 1000 MG CAPS Take 1 capsule by mouth daily.    . OXYGEN Inhale 2-3 L into the lungs as needed. With exertion only     . pravastatin (PRAVACHOL) 80 MG tablet Take 80 mg by mouth at bedtime.     . sildenafil (VIAGRA)  25 MG tablet Take 25 mg by mouth See admin instructions. Use as directed    . sodium chloride (OCEAN) 0.65 % SOLN nasal spray Place 1 spray into both nostrils as needed for congestion.    . Testosterone 40.5 MG/2.5GM (1.62%) GEL Place 1 application onto the skin daily. Rub one pump on to both shoulder blades    . Tiotropium Bromide Monohydrate (SPIRIVA RESPIMAT) 2.5 MCG/ACT AERS Inhale 2 puffs into the lungs every morning. 2 Inhaler 0  . vitamin B-12 (CYANOCOBALAMIN) 1000 MCG tablet Take 1,000 mcg by mouth daily.     No current facility-administered medications for this visit.      Musculoskeletal: Strength & Muscle Tone: decreased Gait & Station: unsteady Patient leans: Using wheelchair.  Psychiatric Specialty Exam: Review of Systems  Respiratory: Positive for shortness of breath.        On oxygen  Musculoskeletal:       Chronic pain    Blood pressure 126/74, pulse (!) 107, height 5\' 10"  (1.778 m), weight 205 lb (93 kg).There is no height or weight on file to calculate BMI.  General Appearance: Fairly Groomed  Eye Contact:  Fair  Speech:  Slow  Volume:  Normal  Mood:  Euthymic  Affect:  Congruent  Thought Process:  Goal Directed  Orientation:  Full (Time, Place, and Person)  Thought Content: Rumination   Suicidal Thoughts:  No  Homicidal Thoughts:  No  Memory:  Immediate;   Fair Recent;   Fair Remote;   Fair  Judgement:  Good  Insight:  Good  Psychomotor Activity:  Decreased  Concentration:  Concentration: Fair and Attention Span: Fair  Recall:  [employer] of Knowledge: Good  Language: Good  Akathisia:  No  Handed:  Right  AIMS (if indicated): not done  Assets:  Communication Skills Desire for Improvement Housing Resilience Social Support  ADL's:  Intact  Cognition: WNL  Sleep:  Good   Screenings:   Assessment and Plan: Major depressive disorder, recurrent.  Anxiety disorder NOS.  Patient doing better on amitriptyline however sometimes he has dizziness  when he tried to stand up quickly.  We discussed postural hypotension and recommended to take extra time when he is changing his posture.  Patient like to continue amitriptyline since it is helping his sleep, depression and chronic pain.  Continue BuSpar 5 mg twice a day and Remeron 45 mg at bedtime.  Encouraged to keep appointment with his primary care physician for chronic pain if he feel amitriptyline not helping enough.  Discussed medication side effects and benefits.  Recommended to call us back if is any question or any concern.  Follow-up in 3 months.   Kathlee Nations, MD 07/04/2017, 1:27 PM

## 2017-07-05 ENCOUNTER — Ambulatory Visit: Payer: Medicare Other | Admitting: Internal Medicine

## 2017-07-05 ENCOUNTER — Encounter: Payer: Self-pay | Admitting: Internal Medicine

## 2017-07-05 VITALS — BP 126/82 | HR 93 | Ht 70.0 in | Wt 205.0 lb

## 2017-07-05 DIAGNOSIS — J449 Chronic obstructive pulmonary disease, unspecified: Secondary | ICD-10-CM | POA: Diagnosis not present

## 2017-07-05 DIAGNOSIS — J9611 Chronic respiratory failure with hypoxia: Secondary | ICD-10-CM

## 2017-07-05 NOTE — Patient Instructions (Addendum)
No change in medications  See calendar for specific medication instructions and bring it back for each and every office visit for every healthcare provider you see.  Without it,  you may not receive the best quality medical care that we feel you deserve.  You will note that the calendar groups together  your maintenance  medications that are timed at particular times of the day.  Think of this as your checklist for what your doctor has instructed you to do until your next evaluation to see what benefit  there is  to staying on a consistent group of medications intended to keep you well.  The other group at the bottom is entirely up to you to use as you see fit  for specific symptoms that may arise between visits that require you to treat them on an as needed basis.  Think of this as your action plan or "what if" list.   Separating the top medications from the bottom group is fundamental to providing you adequate care going forward.     Please schedule a follow up visit in 6  months but call sooner if needed  

## 2017-07-05 NOTE — Progress Notes (Signed)
Subjective:    Patient ID: [name], male    DOB: March 11, 1951  MRN: [id number]    Brief patient profile:  72 yobm quit smoking 12/03/14 on disability due to depression since 1994 baseline on spiriva with  doe room to room s 02 then underwent L THR 03/2013  at [facility] and noted even in hosp need 02 with any activity so referred to pulmonary clinic 05/01/2013 by [facility] with GOLD III copd with mild reversibility documented 06/12/13    History of Present Illness  05/01/2013 1st [facility] office visit/ Wert cc doe x room to room if not on 02 using walker ever since mobilized from surgery 04/03/13 s cough, some chest tightness once or twice per week esp after cigarette use, has not tried inhalers at home (other than spiriva)  but nebs in hosp seemed to help rec Start symbicort 160 Take 2 puffs first thing in am and then another 2 puffs about 12 hours later.  Continue spiriva one in am Work on inhaler technique:    Please remember to go to the lab and x-ray department downstairs for your tests - we will call you with the results when they are available. The key is to stop smoking completely before smoking completely stops you!      02/10/2016  f/u ov/Wert re: COPD GOLD III/ dulera 200/ spiriva /no med calendar  Chief Complaint  Patient presents with  . Follow-up    PFT's done today. Breathing has improved since the last visit.   ex x 15-20 min s stopping at 2lpm / 2lpm hs  rec Plan A = Automatic =  Performist(formoterol) with budesonide(Pulmonary)  = symbicort/dulera   Twice daily through [place] to use up your spiriva remaining if can't afford to refill it  Plan B = Backup Only use your albuterol (Proair) as rescue     12/29/2016  f/u ov/Wert re:  COPD GOLD III now on symb 160 2bid/ spiriva smi Chief Complaint  Patient presents with  . Follow-up    pt has good and bad days- sob with exertion, prod cough, chest tightness with exertion on bad days.  states today is a good  day.    ex for up to 15 min with therapist then rests but continues for up to an hour with sats upper 80s on 3lpm with ex/ 2lpm otherwise.  Never tries saba before ex but thinks it helps after he stops and rests  rec Goal with 02 is to keep the saturation over 90% so ok to increase  with exertion to maintain this sat Ok to try the proair x 2 pffs  15 min prior to exercise to see if there is any benefit  Work on inhaler technique:   See calendar for specific medication instructions .      07/05/2017  f/u ov/Wert re: copd  III/ 02 dep  Chief Complaint  Patient presents with  . Follow-up    6 month follow up for COPD. States his breathing capacity has increased since last visit. Does not get SOB as quickly as before.   Dyspnea:  Across the room on 3lpm / also limited by L hip pain  Cough: no Sleep: cpap and 3lpm   No obvious day to day or daytime variability or assoc excess/ purulent sputum or mucus plugs or hemoptysis or cp or chest tightness, subjective wheeze or overt sinus or hb symptoms. No unusual exposure hx or h/o childhood pna/ asthma or knowledge  of premature birth.  Sleeping ok flat without nocturnal  or early am exacerbation  of respiratory  c/o's or need for noct saba. Also denies any obvious fluctuation of symptoms with weather or environmental changes or other aggravating or alleviating factors except as outlined above   Current Allergies, Complete Past Medical History, Past Surgical History, Family History, and Social History were reviewed in [employer] record.  ROS  The following are not active complaints unless bolded Hoarseness, sore throat, dysphagia, dental problems, itching, sneezing,  nasal congestion or discharge of excess mucus or purulent secretions, ear ache,   fever, chills, sweats, unintended wt loss or wt gain, classically pleuritic or exertional cp,  orthopnea pnd or leg swelling, presyncope, palpitations, abdominal pain, anorexia, nausea,  vomiting, diarrhea  or change in bowel habits or change in bladder habits, change in stools or change in urine, dysuria, hematuria,  rash, arthralgias, visual complaints, headache, numbness, weakness or ataxia or problems with walking or coordination,  change in mood/affect or memory.        Current Meds  Medication Sig  . albuterol (PROAIR HFA) 108 (90 Base) MCG/ACT inhaler 2 puffs every 4 hours as needed only  if your can't catch your breath  . amitriptyline (ELAVIL) 50 MG tablet Take 1 tablet (50 mg total) by mouth at bedtime.  Marland Kitchen amLODipine (NORVASC) 5 MG tablet Take 5 mg by mouth at bedtime.   Marland Kitchen aspirin EC 81 MG tablet Take 81 mg by mouth at bedtime.  Marland Kitchen atenolol (TENORMIN) 25 MG tablet Take 25 mg by mouth at bedtime.   . busPIRone (BUSPAR) 5 MG tablet Take 1 tablet (5 mg total) by mouth 2 (two) times daily.  . clopidogrel (PLAVIX) 75 MG tablet Take 75 mg by mouth at bedtime.   . Coenzyme Q10 (COQ10) 100 MG CAPS Take 1 capsule by mouth at bedtime.   Marland Kitchen dextromethorphan-guaiFENesin (MUCINEX DM) 30-600 MG 12hr tablet Take 1 tablet by mouth 2 (two) times daily as needed for cough.   . metFORMIN (GLUCOPHAGE) 500 MG tablet Take 1,000 mg by mouth at bedtime. 2 tabs by mouth once daily at bedtime  . mirtazapine (REMERON) 45 MG tablet Take 1 tablet (45 mg total) by mouth at bedtime.  . mometasone-formoterol (DULERA) 200-5 MCG/ACT AERO Inhale 2 puffs into the lungs 2 (two) times daily.  . Multiple Vitamin (MULTIVITAMIN WITH MINERALS) TABS Take 1 tablet by mouth at bedtime.   . nitroGLYCERIN (NITROSTAT) 0.4 MG SL tablet Place 0.4 mg under the tongue every 5 (five) minutes as needed for chest pain.  . Omega-3 Fatty Acids (FISH OIL) 1000 MG CAPS Take 1 capsule by mouth daily.  . OXYGEN Inhale 2-3 L into the lungs as needed. With exertion only   . pravastatin (PRAVACHOL) 80 MG tablet Take 80 mg by mouth at bedtime.   . sildenafil (VIAGRA) 25 MG tablet Take 25 mg by mouth See admin instructions. Use as  directed  . sodium chloride (OCEAN) 0.65 % SOLN nasal spray Place 1 spray into both nostrils as needed for congestion.  . Testosterone 40.5 MG/2.5GM (1.62%) GEL Place 1 application onto the skin daily. Rub one pump on to both shoulder blades  . Tiotropium Bromide Monohydrate (SPIRIVA RESPIMAT) 2.5 MCG/ACT AERS Inhale 2 puffs into the lungs every morning.  . vitamin B-12 (CYANOCOBALAMIN) 1000 MCG tablet Take 1,000 mcg by mouth daily.  Objective:   Physical Exam    W/c bound bm nad at rest   06/12/2013   200 >  08/30/2013  189 >186 09/14/2013 > 10/30/2013  184> 01/28/2014  184 > 03/06/14  194 > 06/26/2014  200> 11/29/2014  200> 05/28/2015  212 > 11/10/2015   202 > 02/10/2016  200 > 12/29/2016    205 >  07/05/2017  205      HEENT: nl dentition, turbinates bilaterally, and oropharynx. Nl external ear canals without cough reflex   NECK :  without JVD/Nodes/TM/ nl carotid upstrokes bilaterally   LUNGS: no acc muscle use,  Barrel contour chest with minimal end exp wheeze bilaterally better with plm   CV:  RRR  no s3 or murmur or increase in P2, and no edema   ABD:  soft and nontender with mid inps hoover's  in the supine position. No bruits or organomegaly appreciated, bowel sounds nl  MS:  Nl gait/ ext warm without deformities, calf tenderness, cyanosis or clubbing No obvious joint restrictions   SKIN: warm and dry without lesions    NEURO:  alert, approp, nl sensorium with  no motor or cerebellar deficits apparent.              Assessment & Plan:

## 2017-07-07 ENCOUNTER — Encounter: Payer: Self-pay | Admitting: Internal Medicine

## 2017-07-07 NOTE — Assessment & Plan Note (Addendum)
-   PFT's 06/12/13  FEV1  1.15 (37%) with ratrio 43 and 14% better p B2 p no rx day of study and DLCO 29% -09/14/2013 med calendar > did not bring to office as requested 10/30/13 or 01/28/14 or 06/27/14  - 01/28/2014 p extensive coaching HFA effectiveness =    75%, 100% with dpi  - PFT's  02/10/2016  FEV1 1.06  (35 % ) ratio 44  p -6 % improvement from saba p prior to study with DLCO  24 % corrects to 37  % for alv volume    - 07/05/2017  After extensive coaching HFA effectiveness =    90% from a baseline of 75% (short Ti)   At this point really more limited by L Hip than sob so no change in rx needed at this point     Each maintenance medication was reviewed in detail including most importantly the difference between maintenance and as needed and under what circumstances the prns are to be used. This was done in the context of a medication calendar review which provided the patient with a user-friendly unambiguous mechanism for medication administration and reconciliation and provides an action plan for all active problems. It is critical that this be shown to every doctor  for modification during the office visit if necessary so the patient can use it as a working document.

## 2017-07-07 NOTE — Assessment & Plan Note (Signed)
Placed on 02 at discharge p THR 03/2013 3h/day and cpap at hs RA sats at rest 94% 06/27/14  so rec use 02 with activity only  - 11/29/2014   Walked RA x one lap @ 185 stopped due to hip pain/ sob with sat 88% > referred for POC  - 05/28/2015 sats ok at rest RA/  Walked on 3lpm  x one lap @ 185 stopped due to sob/ chest tightness but sats ok   As of 07/05/2017   Rest 2.5 lpm/ walk on 3lpm and cpap 2.5 with goal of keeping sats > 90%

## 2017-07-08 ENCOUNTER — Other Ambulatory Visit: Payer: Self-pay

## 2017-07-08 ENCOUNTER — Encounter (HOSPITAL_COMMUNITY)
Admission: RE | Disposition: A | Discharge status: Home / Self Care | Payer: Self-pay | Source: Ambulatory Visit | Attending: Gastroenterology

## 2017-07-08 ENCOUNTER — Encounter (HOSPITAL_COMMUNITY): Payer: Self-pay | Admitting: *Deleted

## 2017-07-08 ENCOUNTER — Ambulatory Visit (HOSPITAL_COMMUNITY)
Admission: RE | Admit: 2017-07-08 | Discharge: 2017-07-08 | Disposition: A | Discharge status: Home / Self Care | Payer: Medicare Other | Source: Ambulatory Visit | Attending: Gastroenterology | Admitting: Gastroenterology

## 2017-07-08 DIAGNOSIS — Z8249 Family history of ischemic heart disease and other diseases of the circulatory system: Secondary | ICD-10-CM | POA: Insufficient documentation

## 2017-07-08 DIAGNOSIS — G4733 Obstructive sleep apnea (adult) (pediatric): Secondary | ICD-10-CM | POA: Insufficient documentation

## 2017-07-08 DIAGNOSIS — Z87891 Personal history of nicotine dependence: Secondary | ICD-10-CM | POA: Diagnosis not present

## 2017-07-08 DIAGNOSIS — Z1211 Encounter for screening for malignant neoplasm of colon: Secondary | ICD-10-CM | POA: Diagnosis not present

## 2017-07-08 DIAGNOSIS — F419 Anxiety disorder, unspecified: Secondary | ICD-10-CM | POA: Insufficient documentation

## 2017-07-08 DIAGNOSIS — K5520 Angiodysplasia of colon without hemorrhage: Secondary | ICD-10-CM | POA: Insufficient documentation

## 2017-07-08 DIAGNOSIS — J449 Chronic obstructive pulmonary disease, unspecified: Secondary | ICD-10-CM | POA: Insufficient documentation

## 2017-07-08 DIAGNOSIS — D175 Benign lipomatous neoplasm of intra-abdominal organs: Secondary | ICD-10-CM | POA: Diagnosis not present

## 2017-07-08 DIAGNOSIS — Z7902 Long term (current) use of antithrombotics/antiplatelets: Secondary | ICD-10-CM | POA: Diagnosis not present

## 2017-07-08 DIAGNOSIS — Z9989 Dependence on other enabling machines and devices: Secondary | ICD-10-CM | POA: Insufficient documentation

## 2017-07-08 DIAGNOSIS — K219 Gastro-esophageal reflux disease without esophagitis: Secondary | ICD-10-CM | POA: Insufficient documentation

## 2017-07-08 DIAGNOSIS — Z955 Presence of coronary angioplasty implant and graft: Secondary | ICD-10-CM | POA: Diagnosis not present

## 2017-07-08 DIAGNOSIS — M1990 Unspecified osteoarthritis, unspecified site: Secondary | ICD-10-CM | POA: Insufficient documentation

## 2017-07-08 DIAGNOSIS — K921 Melena: Secondary | ICD-10-CM | POA: Insufficient documentation

## 2017-07-08 DIAGNOSIS — Z86010 Personal history of colonic polyps: Secondary | ICD-10-CM | POA: Diagnosis not present

## 2017-07-08 DIAGNOSIS — Z9981 Dependence on supplemental oxygen: Secondary | ICD-10-CM | POA: Diagnosis not present

## 2017-07-08 DIAGNOSIS — E785 Hyperlipidemia, unspecified: Secondary | ICD-10-CM | POA: Insufficient documentation

## 2017-07-08 DIAGNOSIS — E119 Type 2 diabetes mellitus without complications: Secondary | ICD-10-CM | POA: Insufficient documentation

## 2017-07-08 DIAGNOSIS — I2510 Atherosclerotic heart disease of native coronary artery without angina pectoris: Secondary | ICD-10-CM | POA: Diagnosis not present

## 2017-07-08 DIAGNOSIS — K5730 Diverticulosis of large intestine without perforation or abscess without bleeding: Secondary | ICD-10-CM | POA: Diagnosis not present

## 2017-07-08 DIAGNOSIS — F329 Major depressive disorder, single episode, unspecified: Secondary | ICD-10-CM | POA: Insufficient documentation

## 2017-07-08 DIAGNOSIS — I252 Old myocardial infarction: Secondary | ICD-10-CM | POA: Diagnosis not present

## 2017-07-08 DIAGNOSIS — I10 Essential (primary) hypertension: Secondary | ICD-10-CM | POA: Insufficient documentation

## 2017-07-08 DIAGNOSIS — Z96642 Presence of left artificial hip joint: Secondary | ICD-10-CM | POA: Insufficient documentation

## 2017-07-08 HISTORY — PX: COLONOSCOPY WITH PROPOFOL: SHX5780

## 2017-07-08 LAB — GLUCOSE, CAPILLARY: Glucose-Capillary: 97 mg/dL (ref 65–99)

## 2017-07-08 SURGERY — COLONOSCOPY WITH PROPOFOL
Anesthesia: Moderate Sedation

## 2017-07-08 MED ORDER — FENTANYL CITRATE (PF) 100 MCG/2ML IJ SOLN
INTRAMUSCULAR | Status: AC
  Filled 2017-07-08: qty 2

## 2017-07-08 MED ORDER — MIDAZOLAM HCL 5 MG/ML IJ SOLN
INTRAMUSCULAR | Status: AC
  Filled 2017-07-08: qty 2

## 2017-07-08 MED ORDER — SODIUM CHLORIDE 0.9 % IV SOLN
INTRAVENOUS | Status: DC
  Administered 2017-07-08: 500 mL via INTRAVENOUS

## 2017-07-08 MED ORDER — FENTANYL CITRATE (PF) 100 MCG/2ML IJ SOLN
INTRAMUSCULAR | Status: DC | PRN
  Administered 2017-07-08 (×2): 25 ug via INTRAVENOUS

## 2017-07-08 MED ORDER — MIDAZOLAM HCL 10 MG/2ML IJ SOLN
INTRAMUSCULAR | Status: DC | PRN
  Administered 2017-07-08: 2 mg via INTRAVENOUS
  Administered 2017-07-08: 1 mg via INTRAVENOUS
  Administered 2017-07-08: 2 mg via INTRAVENOUS

## 2017-07-08 SURGICAL SUPPLY — 21 items

## 2017-07-08 NOTE — Op Note (Signed)
[facility] Patient Name: [name] Procedure Date: 07/08/2017 MRN: [id number] Attending MD: Carol Ada , MD Date of Birth: 07-Jan-1951 CSN: [id number] Age: 67 Admit Type: Outpatient Procedure:                Colonoscopy Indications:              High risk colon cancer surveillance: Personal                            history of colonic polyps Providers:                Carol Ada, MD, Burtis Junes, RN, Laurena Spies,                            Technician Referring MD:              Medicines:                Midazolam 5 mg IV, Fentanyl 50 micrograms IV Complications:            No immediate complications. Estimated Blood Loss:     Estimated blood loss: none. Procedure:                Pre-Anesthesia Assessment:                           - Prior to the procedure, a History and Physical                            was performed, and patient medications and                            allergies were reviewed. [name] tolerance of                            previous anesthesia was also reviewed. The risks                            and benefits of the procedure and the sedation                            options and risks were discussed with the patient.                            All questions were answered, and informed consent                            was obtained. Prior Anticoagulants: The patient has                            taken no previous anticoagulant or antiplatelet                            agents. ASA Grade Assessment: III - A patient with  severe systemic disease. After reviewing the risks                            and benefits, the patient was deemed in                            satisfactory condition to undergo the procedure.                           - Sedation was administered by an anesthesia                            professional. Deep sedation was attained.                           After obtaining informed  consent, the colonoscope                            was passed under direct vision. Throughout the                            procedure, the patient's blood pressure, pulse, and                            oxygen saturations were monitored continuously. The                            EC-[id number] ([id number]) scope was introduced through                            the anus and advanced to the the cecum, identified                            by appendiceal orifice and ileocecal valve. The                            colonoscopy was somewhat difficult due to                            significant looping. Successful completion of the                            procedure was aided by applying abdominal pressure.                            The patient tolerated the procedure well. The                            quality of the bowel preparation was good. The                            ileocecal valve, appendiceal orifice, and rectum  were photographed. Scope In: 11:38:52 AM Scope Out: 11:58:41 AM Scope Withdrawal Time: 0 hours 12 minutes 24 seconds  Total Procedure Duration: 0 hours 19 minutes 49 seconds  Findings:      Multiple small and large patchy angiodysplastic lesions without bleeding       were found in the transverse colon.      Scattered small and large-mouthed diverticula were found in the entire       colon.      There was a large lipoma, in the transverse colon. Impression:               - Multiple non-bleeding colonic angiodysplastic                            lesions.                           - Diverticulosis in the entire examined colon.                           - Large lipoma in the transverse colon.                           - No specimens collected. Moderate Sedation:      Moderate (conscious) sedation was administered by the endoscopy nurse       and supervised by the endoscopist. The following parameters were       monitored: oxygen saturation,  heart rate, blood pressure, and response       to care. Recommendation:           - Patient has a contact number available for                            emergencies. The signs and symptoms of potential                            delayed complications were discussed with the                            patient. Return to normal activities tomorrow.                            Written discharge instructions were provided to the                            patient.                           - Resume previous diet.                           - Continue present medications.                           - Repeat colonoscopy in 5 years for surveillance. Procedure Code(s):        --- Professional ---  45378, Colonoscopy, flexible; diagnostic, including                            collection of specimen(s) by brushing or washing,                            when performed (separate procedure) Diagnosis Code(s):        --- Professional ---                           K55.20, Angiodysplasia of colon without hemorrhage                           Z86.010, Personal history of colonic polyps                           D17.5, Benign lipomatous neoplasm of                            intra-abdominal organs                           K57.30, Diverticulosis of large intestine without                            perforation or abscess without bleeding CPT copyright 2016 American Medical Association. All rights reserved. The codes documented in this report are preliminary and upon coder review may  be revised to meet current compliance requirements. Carol Ada, MD Carol Ada, MD 07/08/2017 12:08:00 PM This report has been signed electronically. Number of Addenda: 0

## 2017-07-08 NOTE — Discharge Instructions (Signed)

## 2017-07-08 NOTE — H&P (Signed)
[name] HPI: The patient recently complained about hematochezia x 2 and both episodes were painless.  His colonoscopy on 08/2014 was positive for multiple adenomas.  He is followed by Dr. Wynonia Lawman and currently he takes Plavix.  Since the last visit he was started on an oxygen concentrator.  Past Medical History:  Diagnosis Date  . Anxiety   . Arthritis   . COPD (chronic obstructive pulmonary disease) ([place])   . Coronary artery disease    x4  . Depression   . Diabetes mellitus without complication ([place])   . GERD (gastroesophageal reflux disease)    tums will relieve  . HLD (hyperlipidemia)   . HTN (hypertension)   . Myocardial infarction ([place])    x2  . Obstructive sleep apnea    sleep study 2006, uses CPAP, pt does not know cpap settings  . On home oxygen therapy    2L via nasal cannula prn  . Shortness of breath    exertion    Past Surgical History:  Procedure Laterality Date  . CARDIAC CATHETERIZATION  2003,2010   x4   . COLONOSCOPY WITH PROPOFOL N/A 08/30/2014   Procedure: COLONOSCOPY WITH PROPOFOL;  Surgeon: Carol Ada, MD;  Location: [facility];  Service: Endoscopy;  Laterality: N/A;  h&p in file   . CORONARY ANGIOPLASTY     stents x 4  . DENTAL SURGERY    . TOTAL HIP ARTHROPLASTY  04/06/2012   Procedure: TOTAL HIP ARTHROPLASTY;  Surgeon: Sharmon Revere, MD;  Location: [facility];  Service: Orthopedics;  Laterality: Right;  . TOTAL HIP ARTHROPLASTY Left 04/03/2013   Procedure: LEFT TOTAL HIP ARTHROPLASTY;  Surgeon: Sharmon Revere, MD;  Location: [facility];  Service: Orthopedics;  Laterality: Left;    Family History  Problem Relation Age of Onset  . Depression Mother   . Heart disease Father   . Emphysema Father        smoked    Social History:  reports that he quit smoking about 2 years ago. His smoking use included cigarettes. He has a 11.25 pack-year smoking history. he has never used smokeless tobacco. He reports that he does not drink alcohol or use  drugs.  Allergies: No Known Allergies  Medications: Scheduled: Continuous:  No results found for this or any previous visit (from the past 24 hour(s)).   No results found.  ROS:  As stated above in the HPI otherwise negative.  There were no vitals taken for this visit.    PE: Gen: NAD, Alert and Oriented HEENT:  [place]/AT, EOMI Neck: Supple, no LAD Lungs: CTA Bilaterally CV: RRR without M/G/R ABM: Soft, NTND, +BS Ext: No C/C/E  Assessment/Plan: 1) Hematochezia. 2) Personal history of polyps.  Plan: 1) Colonoscopy.  [name] D 07/08/2017, 7:45 AM

## 2017-07-18 DIAGNOSIS — G4730 Sleep apnea, unspecified: Secondary | ICD-10-CM | POA: Diagnosis not present

## 2017-07-18 DIAGNOSIS — G4733 Obstructive sleep apnea (adult) (pediatric): Secondary | ICD-10-CM | POA: Diagnosis not present

## 2017-07-19 ENCOUNTER — Ambulatory Visit (INDEPENDENT_AMBULATORY_CARE_PROVIDER_SITE_OTHER): Payer: Self-pay | Admitting: Orthopaedic Surgery

## 2017-07-23 DIAGNOSIS — J449 Chronic obstructive pulmonary disease, unspecified: Secondary | ICD-10-CM | POA: Diagnosis not present

## 2017-07-25 DIAGNOSIS — I10 Essential (primary) hypertension: Secondary | ICD-10-CM | POA: Diagnosis not present

## 2017-07-25 DIAGNOSIS — I519 Heart disease, unspecified: Secondary | ICD-10-CM | POA: Diagnosis not present

## 2017-07-25 DIAGNOSIS — R7303 Prediabetes: Secondary | ICD-10-CM | POA: Diagnosis not present

## 2017-07-25 DIAGNOSIS — G4739 Other sleep apnea: Secondary | ICD-10-CM | POA: Diagnosis not present

## 2017-07-25 DIAGNOSIS — Z125 Encounter for screening for malignant neoplasm of prostate: Secondary | ICD-10-CM | POA: Diagnosis not present

## 2017-08-02 ENCOUNTER — Encounter (INDEPENDENT_AMBULATORY_CARE_PROVIDER_SITE_OTHER): Payer: Self-pay | Admitting: Orthopaedic Surgery

## 2017-08-02 ENCOUNTER — Ambulatory Visit (INDEPENDENT_AMBULATORY_CARE_PROVIDER_SITE_OTHER): Payer: Medicare Other | Admitting: Orthopaedic Surgery

## 2017-08-02 VITALS — BP 138/95 | HR 111 | Ht 70.0 in | Wt 204.0 lb

## 2017-08-02 DIAGNOSIS — Z96643 Presence of artificial hip joint, bilateral: Secondary | ICD-10-CM

## 2017-08-02 NOTE — Progress Notes (Signed)
Office Visit Note   Patient: [name]           Date of Birth: February 21, 1951           MRN: [id number] Visit Date: 08/02/2017              Requested by: Lucianne Lei, [address] [place], [place] [address] PCP: Lucianne Lei, MD   Assessment & Plan: Visit Diagnoses:  1. History of bilateral hip arthroplasty     Plan: Patient has significant deconditioning we discussed standing using his walker and you doing quad strengthening exercises.  He can time his standing position and gradually work on walking slightly more.  He has such significant hypoxia with shortness of breath that his ambulation potential is severely limited with his 2-year history of minimal ambulation.  He stands, transfers and just walks a few steps.  He will work on this at home and gradually try to improve his conditioning.  I discussed with him that I could make an outpatient physical therapy referral if he so desires but his insurance is limited his home health therapy since it had plateaued and did not demonstrate progression.  Follow-Up Instructions: Return in about 3 months (around 11/02/2017).   Orders:  No orders of the defined types were placed in this encounter.  No orders of the defined types were placed in this encounter.     Procedures: No procedures performed   Clinical Data: No additional findings.   Subjective: Chief Complaint  Patient presents with  . Lower Back - Pain  . Right Hip - Pain  . Left Hip - Pain    HPI 67 year old male with bilateral hip pain.  Previous left total hip arthroplasty 2014 and right in 2013  done by Dr. Marily Memos.  Patient states that he has aching pain in her legs that radiate down to his knees.  Sometimes he has some numbness in her left toes.  he is uncomfortable after sitting 20-30 minutes and does better when he changes positions.  Patient has COPD Gold III with chronic hypoxia, smoker.  Patient has been through extensive home therapy thought  gave him some improvement.  He is in a wheelchair and has minimal ambulatory.  He uses a walker at home and is here with his wife.  Review of Systems positive for coronary artery disease x4.  Depression, diabetes, GERD, hyperlipidemia, hypertension, increased BMI, obstructive sleep apnea, home oxygen 2 L, chronic shortness of breath, COPD, anxiety otherwise -14 point review of systems as it pertains HPI.   Objective: Vital Signs: BP (!) 138/95   Pulse (!) 111   Ht 5\' 10"  (1.778 m)   Wt 204 lb (92.5 kg)   BMI 29.27 kg/m   Physical Exam  Constitutional: He is oriented to person, place, and time. He appears well-developed and well-nourished.  HENT:  Head: Normocephalic and atraumatic.  Eyes: Pupils are equal, round, and reactive to light. EOM are normal.  Neck: No tracheal deviation present. No thyromegaly present.  Cardiovascular: Normal rate.  Pulmonary/Chest: Effort normal. He has no wheezes.  Abdominal: Soft. Bowel sounds are normal.  Neurological: He is alert and oriented to person, place, and time.  Skin: Skin is warm and dry. Capillary refill takes less than 2 seconds.  Psychiatric: He has a normal mood and affect. His behavior is normal. Judgment and thought content normal.    Ortho Exam pulses intact.  Patient has bilateral quad weakness.  Specialty Comments:  No specialty comments available.  Imaging: No results found.   PMFS History: Patient Active Problem List   Diagnosis Date Noted  . COPD exacerbation ([place]) 11/11/2015  . Smoker 05/02/2013  . COPD GOLD III with reversibility  05/01/2013  . Chronic respiratory failure with hypoxia ([place]) 05/01/2013  . Depression 06/30/2011   Past Medical History:  Diagnosis Date  . Anxiety   . Arthritis   . COPD (chronic obstructive pulmonary disease) ([place])   . Coronary artery disease    x4  . Depression   . Diabetes mellitus without complication ([place])   . GERD (gastroesophageal reflux disease)    tums will relieve  .  HLD (hyperlipidemia)   . HTN (hypertension)   . Myocardial infarction ([place])    x2  . Obstructive sleep apnea    sleep study 2006, uses CPAP, pt does not know cpap settings  . On home oxygen therapy    2L via nasal cannula prn  . Shortness of breath    exertion    Family History  Problem Relation Age of Onset  . Depression Mother   . Heart disease Father   . Emphysema Father        smoked    Past Surgical History:  Procedure Laterality Date  . CARDIAC CATHETERIZATION  2003,2010   x4   . COLONOSCOPY WITH PROPOFOL N/A 08/30/2014   Procedure: COLONOSCOPY WITH PROPOFOL;  Surgeon: Carol Ada, MD;  Location: [facility];  Service: Endoscopy;  Laterality: N/A;  h&p in file   . COLONOSCOPY WITH PROPOFOL N/A 07/08/2017   Procedure: COLONOSCOPY WITH PROPOFOL;  Surgeon: Carol Ada, MD;  Location: [facility];  Service: Endoscopy;  Laterality: N/A;  . CORONARY ANGIOPLASTY     stents x 4  . DENTAL SURGERY    . TOTAL HIP ARTHROPLASTY  04/06/2012   Procedure: TOTAL HIP ARTHROPLASTY;  Surgeon: Sharmon Revere, MD;  Location: [facility];  Service: Orthopedics;  Laterality: Right;  . TOTAL HIP ARTHROPLASTY Left 04/03/2013   Procedure: LEFT TOTAL HIP ARTHROPLASTY;  Surgeon: Sharmon Revere, MD;  Location: [facility];  Service: Orthopedics;  Laterality: Left;   Social History   Occupational History  . Not on file  Tobacco Use  . Smoking status: Former Smoker    Packs/day: 0.25    Years: 45.00    Pack years: 11.25    Types: Cigarettes    Last attempt to quit: 11/24/2014    Years since quitting: 2.7  . Smokeless tobacco: Never Used  Substance and Sexual Activity  . Alcohol use: No    Alcohol/week: 0.0 oz    Comment: Reports drinks a beer maybe 4 times a year.  . Drug use: No  . Sexual activity: Yes    Partners: Female    Birth control/protection: None

## 2017-08-08 ENCOUNTER — Encounter (INDEPENDENT_AMBULATORY_CARE_PROVIDER_SITE_OTHER): Payer: Self-pay | Admitting: Orthopaedic Surgery

## 2017-08-10 DIAGNOSIS — I519 Heart disease, unspecified: Secondary | ICD-10-CM | POA: Diagnosis not present

## 2017-08-10 DIAGNOSIS — I10 Essential (primary) hypertension: Secondary | ICD-10-CM | POA: Diagnosis not present

## 2017-08-10 DIAGNOSIS — J441 Chronic obstructive pulmonary disease with (acute) exacerbation: Secondary | ICD-10-CM | POA: Diagnosis not present

## 2017-08-10 DIAGNOSIS — G4739 Other sleep apnea: Secondary | ICD-10-CM | POA: Diagnosis not present

## 2017-08-10 DIAGNOSIS — N390 Urinary tract infection, site not specified: Secondary | ICD-10-CM | POA: Diagnosis not present

## 2017-08-22 ENCOUNTER — Other Ambulatory Visit (HOSPITAL_COMMUNITY): Payer: Self-pay | Admitting: Psychiatry

## 2017-08-22 DIAGNOSIS — F330 Major depressive disorder, recurrent, mild: Secondary | ICD-10-CM

## 2017-08-23 DIAGNOSIS — J449 Chronic obstructive pulmonary disease, unspecified: Secondary | ICD-10-CM | POA: Diagnosis not present

## 2017-08-30 ENCOUNTER — Telehealth (HOSPITAL_COMMUNITY): Payer: Self-pay

## 2017-08-30 NOTE — Telephone Encounter (Signed)
Patient is calling requesting  A jury letter, he has been asked to appear on May 7th. He would like a letter excusing him, please review and advise, thank you

## 2017-08-30 NOTE — Telephone Encounter (Signed)
Please provide him a letter to be excused from jury.

## 2017-09-02 ENCOUNTER — Other Ambulatory Visit (HOSPITAL_COMMUNITY): Payer: Self-pay | Admitting: Psychiatry

## 2017-09-02 DIAGNOSIS — F330 Major depressive disorder, recurrent, mild: Secondary | ICD-10-CM

## 2017-09-07 NOTE — Telephone Encounter (Signed)
Done. I called patient and let him know that he can come and pick up.

## 2017-09-09 ENCOUNTER — Other Ambulatory Visit (HOSPITAL_COMMUNITY): Payer: Self-pay

## 2017-09-09 DIAGNOSIS — F330 Major depressive disorder, recurrent, mild: Secondary | ICD-10-CM

## 2017-09-09 MED ORDER — MIRTAZAPINE 45 MG PO TABS: 45.0000 mg | ORAL_TABLET | Freq: Every day | ORAL | 0 refills | Status: DC

## 2017-09-22 DIAGNOSIS — J449 Chronic obstructive pulmonary disease, unspecified: Secondary | ICD-10-CM | POA: Diagnosis not present

## 2017-09-28 ENCOUNTER — Ambulatory Visit (HOSPITAL_COMMUNITY): Payer: Self-pay | Admitting: Psychiatry

## 2017-10-18 DIAGNOSIS — G4730 Sleep apnea, unspecified: Secondary | ICD-10-CM | POA: Diagnosis not present

## 2017-10-18 DIAGNOSIS — G4733 Obstructive sleep apnea (adult) (pediatric): Secondary | ICD-10-CM | POA: Diagnosis not present

## 2017-10-23 DIAGNOSIS — J449 Chronic obstructive pulmonary disease, unspecified: Secondary | ICD-10-CM | POA: Diagnosis not present

## 2017-10-24 ENCOUNTER — Ambulatory Visit (HOSPITAL_COMMUNITY): Payer: Medicare Other | Admitting: Psychiatry

## 2017-10-25 ENCOUNTER — Telehealth (HOSPITAL_COMMUNITY): Payer: Self-pay

## 2017-10-25 ENCOUNTER — Other Ambulatory Visit (HOSPITAL_COMMUNITY): Payer: Self-pay | Admitting: Psychiatry

## 2017-10-25 NOTE — Telephone Encounter (Signed)
Patient is coming in on 6/24, he wants to know if it would be okay to stop the Elavil until then. Patient reports that it has thrown his schedule off, he is sleeping until 1 in the afternoon. Please review and advise, thank you

## 2017-10-26 ENCOUNTER — Ambulatory Visit (HOSPITAL_COMMUNITY): Payer: Medicare Other | Admitting: Psychiatry

## 2017-10-26 NOTE — Telephone Encounter (Signed)
I called patient and let him know what Dr. Adele Schilder said, patient was agreeable and will call with any questions or concerns

## 2017-10-26 NOTE — Telephone Encounter (Signed)
He can stop Elavil however if he started to feel anxious and depressed then he can resume with 25 mg only at bedtime

## 2017-10-28 ENCOUNTER — Other Ambulatory Visit (HOSPITAL_COMMUNITY): Payer: Self-pay | Admitting: Psychiatry

## 2017-10-28 ENCOUNTER — Ambulatory Visit (INDEPENDENT_AMBULATORY_CARE_PROVIDER_SITE_OTHER): Payer: Medicare Other | Admitting: Orthopaedic Surgery

## 2017-10-28 DIAGNOSIS — F330 Major depressive disorder, recurrent, mild: Secondary | ICD-10-CM

## 2017-10-31 ENCOUNTER — Other Ambulatory Visit (HOSPITAL_COMMUNITY): Payer: Self-pay

## 2017-10-31 DIAGNOSIS — F330 Major depressive disorder, recurrent, mild: Secondary | ICD-10-CM

## 2017-10-31 MED ORDER — AMITRIPTYLINE HCL 50 MG PO TABS: 50.0000 mg | ORAL_TABLET | Freq: Every day | ORAL | 0 refills | Status: DC

## 2017-11-07 ENCOUNTER — Telehealth (HOSPITAL_COMMUNITY): Payer: Self-pay

## 2017-11-07 ENCOUNTER — Ambulatory Visit (HOSPITAL_COMMUNITY): Payer: Medicare Other | Admitting: Psychiatry

## 2017-11-07 NOTE — Telephone Encounter (Signed)
Patients wife came in today and she reports the patient is sleeping too long and then can not sleep at night. He could not get up for the appointment today - she said he has stopped exercising and does not like to even get out of the bed. Patient has an appointment with [name] tomorrow and he has some anxiety about it. He is taking his medication as prescribed and needs a refill on the Buspar. I have rescheduled patient for 8/5

## 2017-11-08 DIAGNOSIS — N390 Urinary tract infection, site not specified: Secondary | ICD-10-CM | POA: Diagnosis not present

## 2017-11-08 DIAGNOSIS — J441 Chronic obstructive pulmonary disease with (acute) exacerbation: Secondary | ICD-10-CM | POA: Diagnosis not present

## 2017-11-08 DIAGNOSIS — I519 Heart disease, unspecified: Secondary | ICD-10-CM | POA: Diagnosis not present

## 2017-11-08 DIAGNOSIS — I10 Essential (primary) hypertension: Secondary | ICD-10-CM | POA: Diagnosis not present

## 2017-11-11 ENCOUNTER — Other Ambulatory Visit (HOSPITAL_COMMUNITY): Payer: Self-pay

## 2017-11-11 DIAGNOSIS — F330 Major depressive disorder, recurrent, mild: Secondary | ICD-10-CM

## 2017-11-11 MED ORDER — BUSPIRONE HCL 5 MG PO TABS: 5.0000 mg | ORAL_TABLET | Freq: Two times a day (BID) | ORAL | 0 refills | Status: DC

## 2017-11-21 DIAGNOSIS — G4733 Obstructive sleep apnea (adult) (pediatric): Secondary | ICD-10-CM | POA: Diagnosis not present

## 2017-11-21 DIAGNOSIS — G4730 Sleep apnea, unspecified: Secondary | ICD-10-CM | POA: Diagnosis not present

## 2017-11-22 ENCOUNTER — Ambulatory Visit (INDEPENDENT_AMBULATORY_CARE_PROVIDER_SITE_OTHER): Payer: Medicare Other | Admitting: Orthopaedic Surgery

## 2017-11-22 DIAGNOSIS — J449 Chronic obstructive pulmonary disease, unspecified: Secondary | ICD-10-CM | POA: Diagnosis not present

## 2017-12-02 ENCOUNTER — Ambulatory Visit (INDEPENDENT_AMBULATORY_CARE_PROVIDER_SITE_OTHER): Payer: Medicare Other | Admitting: Orthopaedic Surgery

## 2017-12-11 IMAGING — DX DG CHEST 2V
2 series · 2 of 2 positions shown · non-contrast
Comparison: 05/28/2015

CLINICAL DATA: Routine, no chest complains, HTN, DM, x-smoker, hx
of COPD, CAD.

EXAM:
CHEST  2 VIEW

[chest pa]
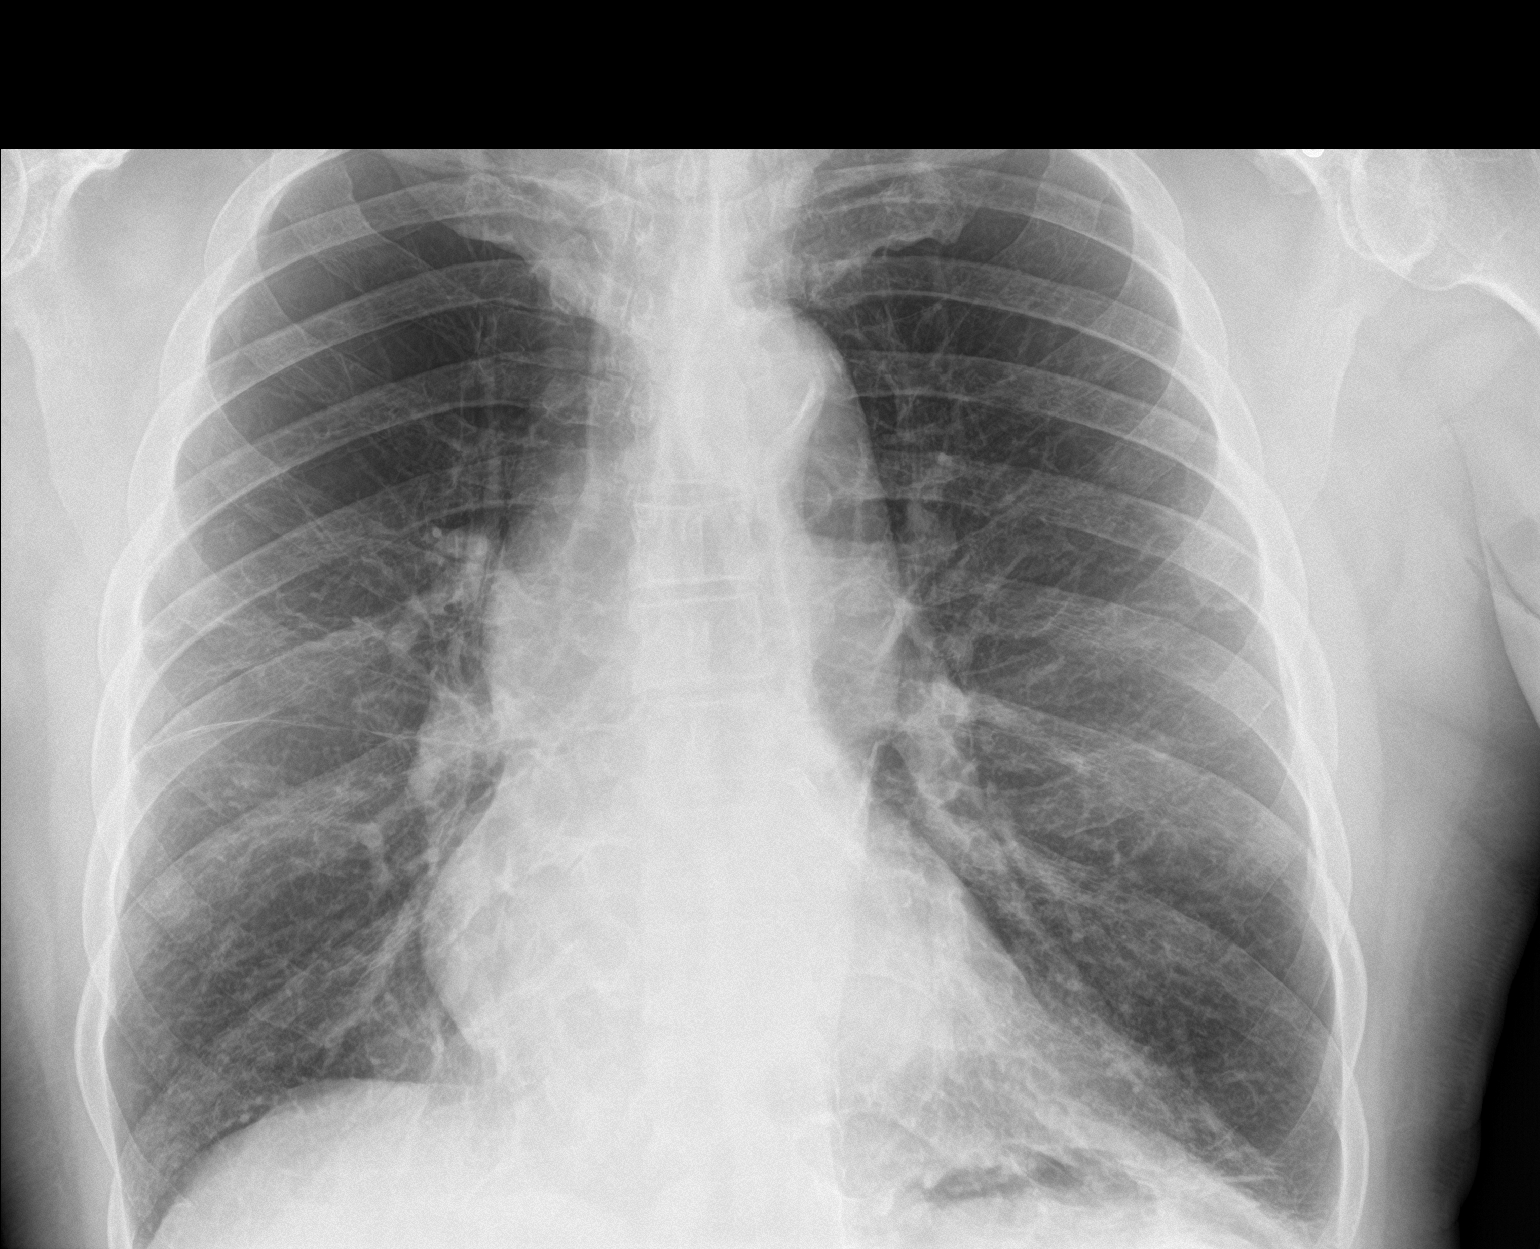

[chest lat]
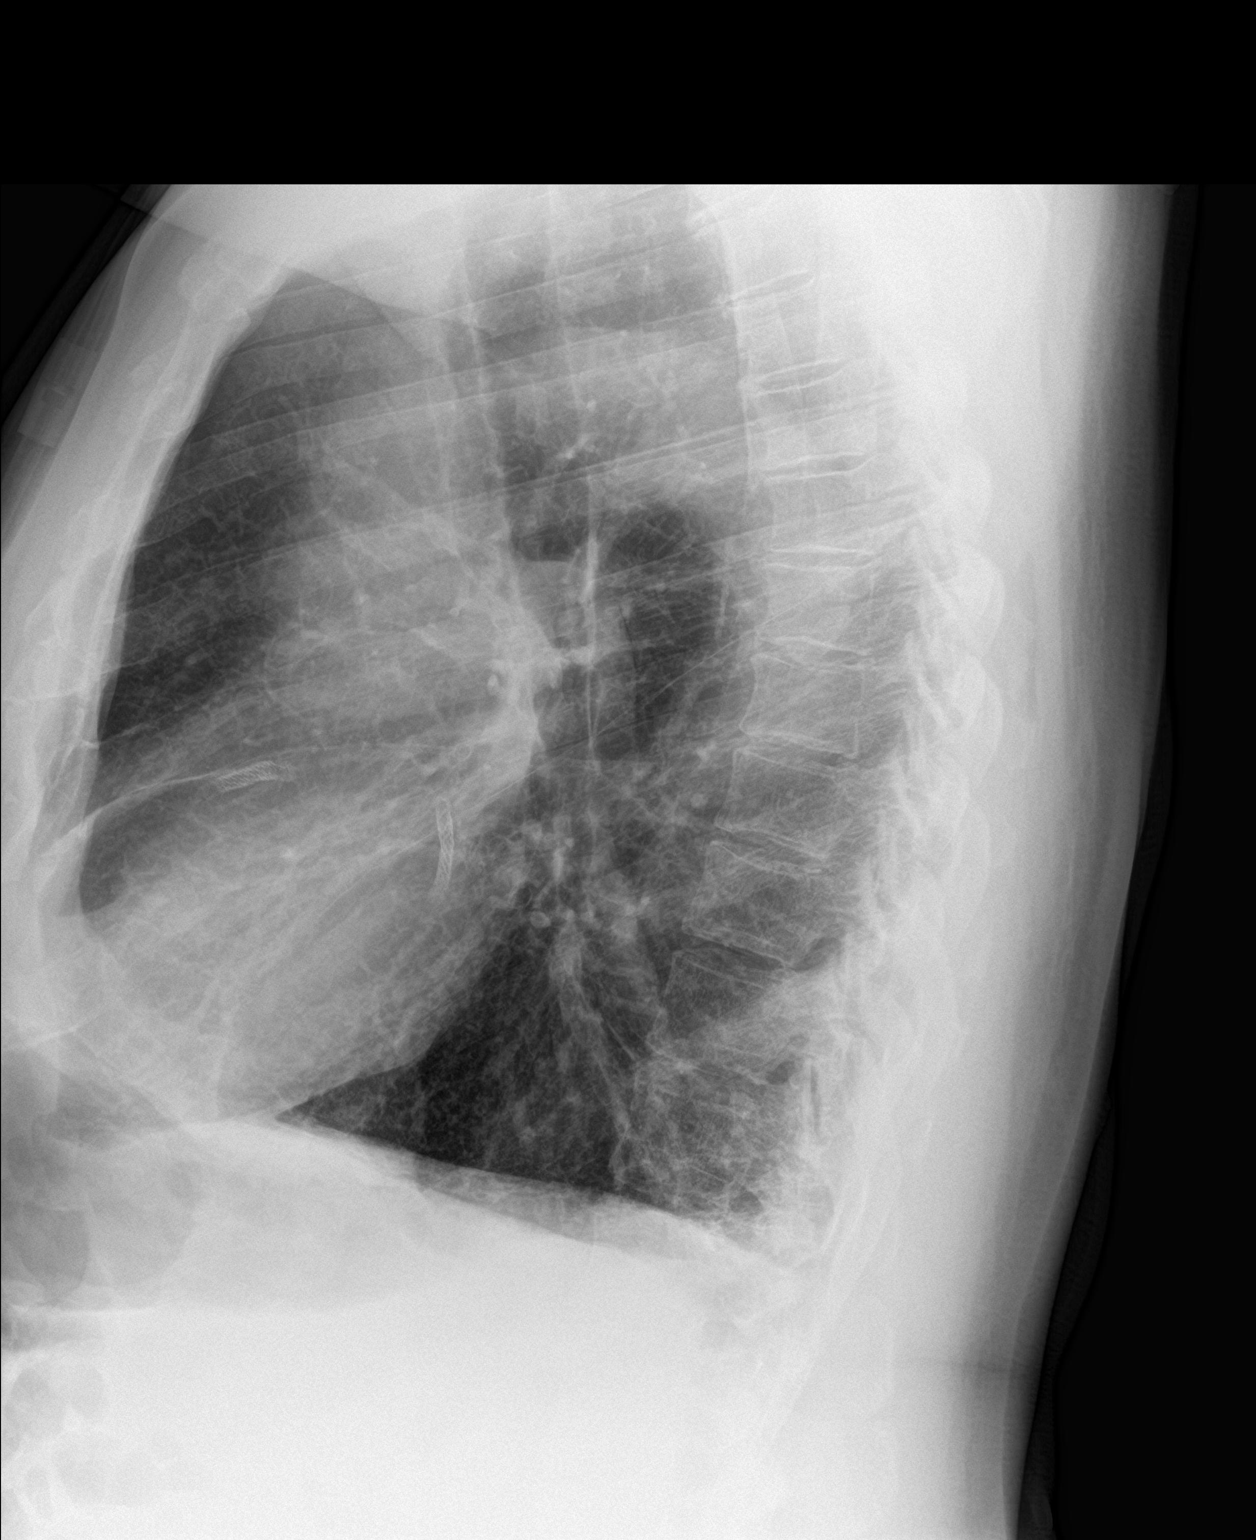

[2 of 2 positions shown; findings below may reference images not displayed]

FINDINGS: The cardiac silhouette is normal in size. There stable Coronary
artery stents. No mediastinal or hilar masses. No evidence of
adenopathy.

Lungs are hyperexpanded with chronic bronchitic changes in the lung
bases as well as stable reticular scarring. Linear and reticular
scarring is also noted in right midline corresponding to the
inferior right upper lobe. Relative lucency in the upper lobes
reflects emphysema.

There is no evidence of pneumonia or pulmonary edema. No pleural
effusion or pneumothorax.

Skeletal structures are unremarkable.
IMPRESSION: 1. No acute cardiopulmonary disease.
2. Stable chronic lung changes and COPD.

## 2017-12-19 ENCOUNTER — Ambulatory Visit (HOSPITAL_COMMUNITY): Payer: Medicare Other | Admitting: Psychiatry

## 2017-12-20 ENCOUNTER — Other Ambulatory Visit (HOSPITAL_COMMUNITY): Payer: Self-pay | Admitting: Psychiatry

## 2017-12-20 DIAGNOSIS — F330 Major depressive disorder, recurrent, mild: Secondary | ICD-10-CM

## 2017-12-23 DIAGNOSIS — J449 Chronic obstructive pulmonary disease, unspecified: Secondary | ICD-10-CM | POA: Diagnosis not present

## 2018-01-02 ENCOUNTER — Ambulatory Visit: Payer: Self-pay | Admitting: Internal Medicine

## 2018-01-12 ENCOUNTER — Ambulatory Visit: Payer: Self-pay | Admitting: Internal Medicine

## 2018-01-18 DIAGNOSIS — G4730 Sleep apnea, unspecified: Secondary | ICD-10-CM | POA: Diagnosis not present

## 2018-01-18 DIAGNOSIS — G4733 Obstructive sleep apnea (adult) (pediatric): Secondary | ICD-10-CM | POA: Diagnosis not present

## 2018-01-23 DIAGNOSIS — J449 Chronic obstructive pulmonary disease, unspecified: Secondary | ICD-10-CM | POA: Diagnosis not present

## 2018-01-24 ENCOUNTER — Other Ambulatory Visit (HOSPITAL_COMMUNITY): Payer: Self-pay | Admitting: Psychiatry

## 2018-01-24 DIAGNOSIS — F330 Major depressive disorder, recurrent, mild: Secondary | ICD-10-CM

## 2018-02-01 ENCOUNTER — Ambulatory Visit (HOSPITAL_COMMUNITY): Payer: Medicare Other | Admitting: Psychiatry

## 2018-02-01 DIAGNOSIS — R972 Elevated prostate specific antigen [PSA]: Secondary | ICD-10-CM | POA: Insufficient documentation

## 2018-02-02 ENCOUNTER — Ambulatory Visit (HOSPITAL_COMMUNITY): Payer: Medicare Other | Admitting: Psychiatry

## 2018-02-06 ENCOUNTER — Other Ambulatory Visit (HOSPITAL_COMMUNITY): Payer: Self-pay

## 2018-02-06 ENCOUNTER — Telehealth (HOSPITAL_COMMUNITY): Payer: Self-pay

## 2018-02-06 DIAGNOSIS — F330 Major depressive disorder, recurrent, mild: Secondary | ICD-10-CM

## 2018-02-06 MED ORDER — BUSPIRONE HCL 5 MG PO TABS: 5.0000 mg | ORAL_TABLET | Freq: Two times a day (BID) | ORAL | 0 refills | Status: DC

## 2018-02-07 DIAGNOSIS — M130 Polyarthritis, unspecified: Secondary | ICD-10-CM | POA: Diagnosis not present

## 2018-02-07 DIAGNOSIS — J449 Chronic obstructive pulmonary disease, unspecified: Secondary | ICD-10-CM | POA: Diagnosis not present

## 2018-02-07 DIAGNOSIS — I519 Heart disease, unspecified: Secondary | ICD-10-CM | POA: Diagnosis not present

## 2018-02-07 DIAGNOSIS — E119 Type 2 diabetes mellitus without complications: Secondary | ICD-10-CM | POA: Diagnosis not present

## 2018-02-27 ENCOUNTER — Ambulatory Visit (HOSPITAL_COMMUNITY): Payer: Medicare Other | Admitting: Psychiatry

## 2018-03-09 ENCOUNTER — Telehealth (INDEPENDENT_AMBULATORY_CARE_PROVIDER_SITE_OTHER): Payer: Self-pay | Admitting: Orthopaedic Surgery

## 2018-03-09 NOTE — Telephone Encounter (Signed)
FYI

## 2018-03-09 NOTE — Telephone Encounter (Signed)
Patient called to let Dr. Lorin Mercy know he thinks he is getting a UTI and wanted to know if he could bring by a urine sample, to double check I called triage and advised he should follow up with PCP and that I would send a message to make Dr. Lorin Mercy aware. Please advise if needed # [phone]

## 2018-03-09 NOTE — Telephone Encounter (Signed)
Please advise 

## 2018-03-09 NOTE — Telephone Encounter (Signed)
Patient called to see if Dr. Lorin Mercy could recommend or order bath rails/handles for his bath tub, he said his therapist has recommended them. Please advise  # [phone]

## 2018-03-09 NOTE — Telephone Encounter (Signed)
I called discussed.  

## 2018-03-10 NOTE — Telephone Encounter (Signed)
I discussed , see his PCP

## 2018-03-13 DIAGNOSIS — N390 Urinary tract infection, site not specified: Secondary | ICD-10-CM | POA: Diagnosis not present

## 2018-03-13 DIAGNOSIS — R269 Unspecified abnormalities of gait and mobility: Secondary | ICD-10-CM | POA: Diagnosis not present

## 2018-03-13 DIAGNOSIS — J449 Chronic obstructive pulmonary disease, unspecified: Secondary | ICD-10-CM | POA: Diagnosis not present

## 2018-03-13 DIAGNOSIS — Z9981 Dependence on supplemental oxygen: Secondary | ICD-10-CM | POA: Diagnosis not present

## 2018-03-13 DIAGNOSIS — M6281 Muscle weakness (generalized): Secondary | ICD-10-CM | POA: Diagnosis not present

## 2018-03-13 DIAGNOSIS — E119 Type 2 diabetes mellitus without complications: Secondary | ICD-10-CM | POA: Diagnosis not present

## 2018-03-13 DIAGNOSIS — I10 Essential (primary) hypertension: Secondary | ICD-10-CM | POA: Diagnosis not present

## 2018-03-13 DIAGNOSIS — M0609 Rheumatoid arthritis without rheumatoid factor, multiple sites: Secondary | ICD-10-CM | POA: Diagnosis not present

## 2018-03-15 DIAGNOSIS — M6281 Muscle weakness (generalized): Secondary | ICD-10-CM | POA: Diagnosis not present

## 2018-03-15 DIAGNOSIS — I10 Essential (primary) hypertension: Secondary | ICD-10-CM | POA: Diagnosis not present

## 2018-03-15 DIAGNOSIS — J449 Chronic obstructive pulmonary disease, unspecified: Secondary | ICD-10-CM | POA: Diagnosis not present

## 2018-03-15 DIAGNOSIS — M0609 Rheumatoid arthritis without rheumatoid factor, multiple sites: Secondary | ICD-10-CM | POA: Diagnosis not present

## 2018-03-15 DIAGNOSIS — R269 Unspecified abnormalities of gait and mobility: Secondary | ICD-10-CM | POA: Diagnosis not present

## 2018-03-15 DIAGNOSIS — E119 Type 2 diabetes mellitus without complications: Secondary | ICD-10-CM | POA: Diagnosis not present

## 2018-03-15 DIAGNOSIS — Z9981 Dependence on supplemental oxygen: Secondary | ICD-10-CM | POA: Diagnosis not present

## 2018-03-16 DIAGNOSIS — Z0000 Encounter for general adult medical examination without abnormal findings: Secondary | ICD-10-CM | POA: Diagnosis not present

## 2018-03-16 DIAGNOSIS — J449 Chronic obstructive pulmonary disease, unspecified: Secondary | ICD-10-CM | POA: Diagnosis not present

## 2018-03-16 DIAGNOSIS — L0390 Cellulitis, unspecified: Secondary | ICD-10-CM | POA: Diagnosis not present

## 2018-03-16 DIAGNOSIS — H6002 Abscess of left external ear: Secondary | ICD-10-CM | POA: Diagnosis not present

## 2018-03-22 DIAGNOSIS — Z9981 Dependence on supplemental oxygen: Secondary | ICD-10-CM | POA: Diagnosis not present

## 2018-03-22 DIAGNOSIS — M0609 Rheumatoid arthritis without rheumatoid factor, multiple sites: Secondary | ICD-10-CM | POA: Diagnosis not present

## 2018-03-22 DIAGNOSIS — E119 Type 2 diabetes mellitus without complications: Secondary | ICD-10-CM | POA: Diagnosis not present

## 2018-03-22 DIAGNOSIS — J449 Chronic obstructive pulmonary disease, unspecified: Secondary | ICD-10-CM | POA: Diagnosis not present

## 2018-03-22 DIAGNOSIS — I10 Essential (primary) hypertension: Secondary | ICD-10-CM | POA: Diagnosis not present

## 2018-03-22 DIAGNOSIS — R269 Unspecified abnormalities of gait and mobility: Secondary | ICD-10-CM | POA: Diagnosis not present

## 2018-03-22 DIAGNOSIS — M6281 Muscle weakness (generalized): Secondary | ICD-10-CM | POA: Diagnosis not present

## 2018-03-24 ENCOUNTER — Ambulatory Visit (HOSPITAL_COMMUNITY): Payer: Medicare Other | Admitting: Psychiatry

## 2018-03-25 DIAGNOSIS — J449 Chronic obstructive pulmonary disease, unspecified: Secondary | ICD-10-CM | POA: Diagnosis not present

## 2018-03-29 DIAGNOSIS — E119 Type 2 diabetes mellitus without complications: Secondary | ICD-10-CM | POA: Diagnosis not present

## 2018-03-29 DIAGNOSIS — I10 Essential (primary) hypertension: Secondary | ICD-10-CM | POA: Diagnosis not present

## 2018-03-29 DIAGNOSIS — Z9981 Dependence on supplemental oxygen: Secondary | ICD-10-CM | POA: Diagnosis not present

## 2018-03-29 DIAGNOSIS — M6281 Muscle weakness (generalized): Secondary | ICD-10-CM | POA: Diagnosis not present

## 2018-03-29 DIAGNOSIS — J449 Chronic obstructive pulmonary disease, unspecified: Secondary | ICD-10-CM | POA: Diagnosis not present

## 2018-03-29 DIAGNOSIS — R269 Unspecified abnormalities of gait and mobility: Secondary | ICD-10-CM | POA: Diagnosis not present

## 2018-03-29 DIAGNOSIS — M0609 Rheumatoid arthritis without rheumatoid factor, multiple sites: Secondary | ICD-10-CM | POA: Diagnosis not present

## 2018-03-31 DIAGNOSIS — L0390 Cellulitis, unspecified: Secondary | ICD-10-CM | POA: Diagnosis not present

## 2018-04-12 DIAGNOSIS — E119 Type 2 diabetes mellitus without complications: Secondary | ICD-10-CM | POA: Diagnosis not present

## 2018-04-12 DIAGNOSIS — J449 Chronic obstructive pulmonary disease, unspecified: Secondary | ICD-10-CM | POA: Diagnosis not present

## 2018-04-12 DIAGNOSIS — M6281 Muscle weakness (generalized): Secondary | ICD-10-CM | POA: Diagnosis not present

## 2018-04-12 DIAGNOSIS — I10 Essential (primary) hypertension: Secondary | ICD-10-CM | POA: Diagnosis not present

## 2018-04-12 DIAGNOSIS — R269 Unspecified abnormalities of gait and mobility: Secondary | ICD-10-CM | POA: Diagnosis not present

## 2018-04-12 DIAGNOSIS — M0609 Rheumatoid arthritis without rheumatoid factor, multiple sites: Secondary | ICD-10-CM | POA: Diagnosis not present

## 2018-04-12 DIAGNOSIS — Z9981 Dependence on supplemental oxygen: Secondary | ICD-10-CM | POA: Diagnosis not present

## 2018-04-17 ENCOUNTER — Telehealth: Payer: Self-pay | Admitting: Internal Medicine

## 2018-04-17 NOTE — Telephone Encounter (Signed)
[employer] application was completed and placed up front for pick up  I called and left a detailed msg for spouse to be aware

## 2018-04-19 DIAGNOSIS — G4733 Obstructive sleep apnea (adult) (pediatric): Secondary | ICD-10-CM | POA: Diagnosis not present

## 2018-04-19 DIAGNOSIS — G4730 Sleep apnea, unspecified: Secondary | ICD-10-CM | POA: Diagnosis not present

## 2018-04-24 ENCOUNTER — Other Ambulatory Visit (HOSPITAL_COMMUNITY): Payer: Self-pay | Admitting: Psychiatry

## 2018-04-24 DIAGNOSIS — J449 Chronic obstructive pulmonary disease, unspecified: Secondary | ICD-10-CM | POA: Diagnosis not present

## 2018-04-24 DIAGNOSIS — F330 Major depressive disorder, recurrent, mild: Secondary | ICD-10-CM

## 2018-04-28 ENCOUNTER — Other Ambulatory Visit (HOSPITAL_COMMUNITY): Payer: Self-pay

## 2018-04-28 DIAGNOSIS — F330 Major depressive disorder, recurrent, mild: Secondary | ICD-10-CM

## 2018-04-28 MED ORDER — BUSPIRONE HCL 5 MG PO TABS: 5.0000 mg | ORAL_TABLET | Freq: Two times a day (BID) | ORAL | 0 refills | Status: DC

## 2018-05-03 ENCOUNTER — Telehealth: Payer: Self-pay | Admitting: Internal Medicine

## 2018-05-03 NOTE — Telephone Encounter (Signed)
I received the handicap placard form and placed in Dr Gustavus Bryant lookat box to be signed, thanks

## 2018-05-03 NOTE — Telephone Encounter (Signed)
Magda Paganini look out for forms left in [facility] box.

## 2018-05-04 NOTE — Telephone Encounter (Signed)
Form done  Pt aware  Will place up front per pt request  Nothing further needed

## 2018-05-15 ENCOUNTER — Ambulatory Visit (HOSPITAL_COMMUNITY): Payer: Medicare Other | Admitting: Psychiatry

## 2018-05-22 DIAGNOSIS — G4733 Obstructive sleep apnea (adult) (pediatric): Secondary | ICD-10-CM | POA: Diagnosis not present

## 2018-05-22 DIAGNOSIS — G4730 Sleep apnea, unspecified: Secondary | ICD-10-CM | POA: Diagnosis not present

## 2018-05-25 ENCOUNTER — Ambulatory Visit (HOSPITAL_COMMUNITY): Payer: Medicare Other | Admitting: Psychiatry

## 2018-05-25 VITALS — BP 128/80 | Ht 70.0 in | Wt 195.0 lb

## 2018-05-25 DIAGNOSIS — F330 Major depressive disorder, recurrent, mild: Secondary | ICD-10-CM | POA: Diagnosis not present

## 2018-05-25 DIAGNOSIS — F411 Generalized anxiety disorder: Secondary | ICD-10-CM | POA: Diagnosis not present

## 2018-05-25 DIAGNOSIS — J449 Chronic obstructive pulmonary disease, unspecified: Secondary | ICD-10-CM | POA: Diagnosis not present

## 2018-05-25 MED ORDER — MIRTAZAPINE 45 MG PO TABS: 45.0000 mg | ORAL_TABLET | Freq: Every day | ORAL | 0 refills | Status: DC

## 2018-05-25 MED ORDER — BUPROPION HCL 75 MG PO TABS: 75.0000 mg | ORAL_TABLET | ORAL | 0 refills | Status: DC

## 2018-05-25 MED ORDER — BUSPIRONE HCL 5 MG PO TABS: 5.0000 mg | ORAL_TABLET | Freq: Two times a day (BID) | ORAL | 0 refills | Status: DC

## 2018-05-25 NOTE — Progress Notes (Signed)
[facility] MD/PA/NP OP Progress Note  05/25/2018 1:04 PM [name]  MRN:  [id number]  Chief Complaint: I have no energy and motivation.  HPI: [name] came for his follow-up appointment with his wife.  He was last seen in February 2019.  He admitted missing appointment but compliant with Remeron and BuSpar.  He was taking amitriptyline but he started to have too much sleep and decided to stop.  He endorsed that sometime he has no motivation to do any things including going to the doctor's appointment.  He has decreased energy, motivation, lack of interest but denies any crying spells or any suicidal thoughts.  I reviewed his medication.  He was taking testosterone however patient stopped the testosterone because of prostate issues.  He has a scheduled appointment tomorrow to see his urology to discuss again about resuming his testosterone.  But he also like something to help his energy, motivation, residual depression.  He has no tremors shakes or any EPS.  Since he stopped the amitriptyline he is sleeping 6 to 7 hours.  His wife endorse his mood is down but no agitation, anger or any aggression.  He feels proud that he is not using drugs for a while.  He feels sometimes anxious and worried about his health but no panic attacks.  His energy level is low.  He denies any paranoia, hallucination or any panic attack.  Visit Diagnosis:    ICD-10-CM   1. GAD (generalized anxiety disorder) F41.1 busPIRone (BUSPAR) 5 MG tablet  2. Major depressive disorder, recurrent episode, mild (HCC) F33.0 busPIRone (BUSPAR) 5 MG tablet    mirtazapine (REMERON) 45 MG tablet    buPROPion (WELLBUTRIN) 75 MG tablet    Past Psychiatric History: Reviewed. History of depression since 1990. One suicidal attempt . Tried Zoloft,Paxil,Prozac, Vistaril, trazodoneand Wellbutrin but stopped due to side effects. 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   Past  Medical History:  Past Medical History:  Diagnosis Date  . Anxiety   . Arthritis   . COPD (chronic obstructive pulmonary disease) ([place])   . Coronary artery disease    x4  . Depression   . Diabetes mellitus without complication ([place])   . GERD (gastroesophageal reflux disease)    tums will relieve  . HLD (hyperlipidemia)   . HTN (hypertension)   . Myocardial infarction ([place])    x2  . Obstructive sleep apnea    sleep study 2006, uses CPAP, pt does not know cpap settings  . On home oxygen therapy    2L via nasal cannula prn  . Shortness of breath    exertion    Past Surgical History:  Procedure Laterality Date  . CARDIAC CATHETERIZATION  2003,2010   x4   . COLONOSCOPY WITH PROPOFOL N/A 08/30/2014   Procedure: COLONOSCOPY WITH PROPOFOL;  Surgeon: Carol Ada, MD;  Location: [place];  Service: Endoscopy;  Laterality: N/A;  h&p in file   . COLONOSCOPY WITH PROPOFOL N/A 07/08/2017   Procedure: COLONOSCOPY WITH PROPOFOL;  Surgeon: Carol Ada, MD;  Location: [facility];  Service: Endoscopy;  Laterality: N/A;  . CORONARY ANGIOPLASTY     stents x 4  . DENTAL SURGERY    . TOTAL HIP ARTHROPLASTY  04/06/2012   Procedure: TOTAL HIP ARTHROPLASTY;  Surgeon: Sharmon Revere, MD;  Location: [place];  Service: Orthopedics;  Laterality: Right;  . TOTAL HIP ARTHROPLASTY Left 04/03/2013   Procedure: LEFT TOTAL HIP  ARTHROPLASTY;  Surgeon: Sharmon Revere, MD;  Location: [place];  Service: Orthopedics;  Laterality: Left;    Family Psychiatric History: Reviewed.  Family History:  Family History  Problem Relation Age of Onset  . Depression Mother   . Heart disease Father   . Emphysema Father        smoked    Social History:  Social History   Socioeconomic History  . Marital status: Married    Spouse name: Not on file  . Number of children: Not on file  . Years of education: Not on file  . Highest education level: Not on file  Occupational History  . Not on file  Social Needs   . Financial resource strain: Not on file  . Food insecurity:    Worry: Not on file    Inability: Not on file  . Transportation needs:    Medical: Not on file    Non-medical: Not on file  Tobacco Use  . Smoking status: Former Smoker    Packs/day: 0.25    Years: 45.00    Pack years: 11.25    Types: Cigarettes    Last attempt to quit: 11/24/2014    Years since quitting: 3.5  . Smokeless tobacco: Never Used  Substance and Sexual Activity  . Alcohol use: No    Alcohol/week: 0.0 standard drinks    Comment: Reports drinks a beer maybe 4 times a year.  . Drug use: No  . Sexual activity: Yes    Partners: Female    Birth control/protection: None  Lifestyle  . Physical activity:    Days per week: Not on file    Minutes per session: Not on file  . Stress: Not on file  Relationships  . Social connections:    Talks on phone: Not on file    Gets together: Not on file    Attends religious service: Not on file    Active member of club or organization: Not on file    Attends meetings of clubs or organizations: Not on file    Relationship status: Not on file  Other Topics Concern  . Not on file  Social History Narrative  . Not on file    Allergies: No Known Allergies  Metabolic Disorder Labs: Lab Results  Component Value Date   HGBA1C  06/22/2008    5.7 (NOTE)   The ADA recommends the following therapeutic goal for glycemic   control related to Hgb A1C measurement:   Goal of Therapy:   < 7.0% Hgb A1C   Reference: American Diabetes Association: Clinical Practice   Recommendations 2008, Diabetes Care,  2008, 31:(Suppl 1).   MPG 117 06/22/2008   No results found for: PROLACTIN Lab Results  Component Value Date   CHOL  06/23/2008    174        ATP III CLASSIFICATION:  <200     mg/dL   Desirable  200-239  mg/dL   Borderline High  >=240    mg/dL   High          TRIG 41 06/23/2008   HDL 46 06/23/2008   CHOLHDL 3.8 06/23/2008   VLDL 8 06/23/2008   LDLCALC (H) 06/23/2008     120        Total Cholesterol/HDL:CHD Risk Coronary Heart Disease Risk Table                     Men   Women  1/2 Average Risk   3.4  3.3  Average Risk       5.0   4.4  2 X Average Risk   9.6   7.1  3 X Average Risk  23.4   11.0        Use the calculated Patient Ratio above and the CHD Risk Table to determine the patient's CHD Risk.        ATP III CLASSIFICATION (LDL):  <100     mg/dL   Optimal  100-129  mg/dL   Near or Above                    Optimal  130-159  mg/dL   Borderline  160-189  mg/dL   High  >190     mg/dL   Very High   Lab Results  Component Value Date   TSH 1.958 Test methodology is 3rd generation TSH 12/07/2008   TSH 1.137 Test methodology is 3rd generation TSH 06/24/2008    Therapeutic Level Labs: No results found for: LITHIUM No results found for: VALPROATE No components found for:  CBMZ  Current Medications: Current Outpatient Medications  Medication Sig Dispense Refill  . albuterol (PROAIR HFA) 108 (90 Base) MCG/ACT inhaler 2 puffs every 4 hours as needed only  if your can't catch your breath 3 Inhaler 1  . amitriptyline (ELAVIL) 50 MG tablet Take 1 tablet (50 mg total) by mouth at bedtime. 30 tablet 0  . amLODipine (NORVASC) 5 MG tablet Take 5 mg by mouth at bedtime.     Marland Kitchen aspirin EC 81 MG tablet Take 81 mg by mouth at bedtime.    Marland Kitchen atenolol (TENORMIN) 25 MG tablet Take 25 mg by mouth at bedtime.     . busPIRone (BUSPAR) 5 MG tablet TAKE 1 TABLET BY MOUTH TWO  TIMES DAILY 30 tablet 0  . busPIRone (BUSPAR) 5 MG tablet Take 1 tablet (5 mg total) by mouth 2 (two) times daily. 60 tablet 0  . clopidogrel (PLAVIX) 75 MG tablet Take 75 mg by mouth at bedtime.     . Coenzyme Q10 (COQ10) 100 MG CAPS Take 1 capsule by mouth at bedtime.     Marland Kitchen dextromethorphan-guaiFENesin (MUCINEX DM) 30-600 MG 12hr tablet Take 1 tablet by mouth 2 (two) times daily as needed for cough.     . metFORMIN (GLUCOPHAGE) 500 MG tablet Take 1,000 mg by mouth at bedtime. 2 tabs by mouth  once daily at bedtime    . mirtazapine (REMERON) 45 MG tablet TAKE 1 TABLET BY MOUTH AT  BEDTIME 90 tablet 0  . mometasone-formoterol (DULERA) 200-5 MCG/ACT AERO Inhale 2 puffs into the lungs 2 (two) times daily. 3 Inhaler 3  . Multiple Vitamin (MULTIVITAMIN WITH MINERALS) TABS Take 1 tablet by mouth at bedtime.     . nitroGLYCERIN (NITROSTAT) 0.4 MG SL tablet Place 0.4 mg under the tongue every 5 (five) minutes as needed for chest pain.    . Omega-3 Fatty Acids (FISH OIL) 1000 MG CAPS Take 1 capsule by mouth daily.    . OXYGEN Inhale 2-3 L into the lungs as needed. With exertion only     . pravastatin (PRAVACHOL) 80 MG tablet Take 80 mg by mouth at bedtime.     . sildenafil (VIAGRA) 25 MG tablet Take 25 mg by mouth See admin instructions. Use as directed    . sodium chloride (OCEAN) 0.65 % SOLN nasal spray Place 1 spray into both nostrils as needed for congestion.    . Testosterone 40.5  MG/2.5GM (1.62%) GEL Place 1 application onto the skin daily. Rub one pump on to both shoulder blades    . Tiotropium Bromide Monohydrate (SPIRIVA RESPIMAT) 2.5 MCG/ACT AERS Inhale 2 puffs into the lungs every morning. 2 Inhaler 0  . vitamin B-12 (CYANOCOBALAMIN) 1000 MCG tablet Take 1,000 mcg by mouth daily.     No current facility-administered medications for this visit.      Musculoskeletal: Strength & Muscle Tone: decreased Gait & Station: unsteady, use wheel chair Patient leans: see above  Psychiatric Specialty Exam: Review of Systems  Constitutional: Positive for malaise/fatigue.  Respiratory: Positive for shortness of breath.   Musculoskeletal: Positive for back pain and joint pain.    There were no vitals taken for this visit.There is no height or weight on file to calculate BMI.  General Appearance: Fairly Groomed  Eye Contact:  Fair  Speech:  Slow  Volume:  Decreased  Mood:  Euthymic  Affect:  Congruent  Thought Process:  Descriptions of Associations: Intact  Orientation:  Full (Time,  Place, and Person)  Thought Content: Rumination   Suicidal Thoughts:  No  Homicidal Thoughts:  No  Memory:  Immediate;   Fair Recent;   Fair Remote;   Fair  Judgement:  Fair  Insight:  Fair  Psychomotor Activity:  Decreased  Concentration:  Concentration: Fair and Attention Span: Fair  Recall:  [employer] of Knowledge: Good  Language: Good  Akathisia:  No  Handed:  Right  AIMS (if indicated): not done  Assets:  Communication Skills Desire for Improvement Housing Social Support  ADL's:  Intact  Cognition: WNL  Sleep:  Fair   Screenings:   Assessment and Plan: Major depressive disorder, recurrent.  Generalized anxiety disorder.  Discussed medication and side effects.  Explained that lack of fatigue, attention, energy could be due to low testosterone.  I explained that he should discuss with his urology about reconsidering testosterone if it is indicated and does not have any side effects or issues with the prostate.  I will try low-dose Wellbutrin to help his energy and motivation.  However explained that medicine can cause insomnia, irritability tremors and shakes.  Patient agreed to try low-dose and call us back if he need refills.  We will start Wellbutrin 75 mg in the morning.  Continue BuSpar 5 mg twice a day and Remeron 45 mg at bedtime.  Recommended to call us back if is any question or any concern.  Follow-up in 3 months.   Kathlee Nations, MD 05/25/2018, 1:04 PM

## 2018-05-26 DIAGNOSIS — Z9981 Dependence on supplemental oxygen: Secondary | ICD-10-CM | POA: Diagnosis not present

## 2018-05-26 DIAGNOSIS — Z993 Dependence on wheelchair: Secondary | ICD-10-CM | POA: Diagnosis not present

## 2018-06-19 ENCOUNTER — Ambulatory Visit: Payer: Self-pay | Admitting: Internal Medicine

## 2018-06-22 ENCOUNTER — Ambulatory Visit: Payer: Self-pay | Admitting: Cardiology

## 2018-06-25 DIAGNOSIS — J449 Chronic obstructive pulmonary disease, unspecified: Secondary | ICD-10-CM | POA: Diagnosis not present

## 2018-06-30 ENCOUNTER — Other Ambulatory Visit (HOSPITAL_COMMUNITY): Payer: Self-pay | Admitting: Psychiatry

## 2018-06-30 DIAGNOSIS — F411 Generalized anxiety disorder: Secondary | ICD-10-CM

## 2018-06-30 DIAGNOSIS — F330 Major depressive disorder, recurrent, mild: Secondary | ICD-10-CM

## 2018-07-04 ENCOUNTER — Other Ambulatory Visit (HOSPITAL_COMMUNITY): Payer: Self-pay | Admitting: Psychiatry

## 2018-07-04 DIAGNOSIS — F330 Major depressive disorder, recurrent, mild: Secondary | ICD-10-CM

## 2018-07-12 ENCOUNTER — Other Ambulatory Visit (HOSPITAL_COMMUNITY): Payer: Self-pay | Admitting: Psychiatry

## 2018-07-12 ENCOUNTER — Telehealth: Payer: Self-pay | Admitting: *Deleted

## 2018-07-12 DIAGNOSIS — F411 Generalized anxiety disorder: Secondary | ICD-10-CM

## 2018-07-12 DIAGNOSIS — F330 Major depressive disorder, recurrent, mild: Secondary | ICD-10-CM

## 2018-07-12 NOTE — Telephone Encounter (Signed)
[name] - [facility] stated there was a concern with pt's Remeron 45mg . [name] was difficult to understand. I called [facility] and spoke with Twee and informed, pt is no seen by any doctor in our practice. Twee states disregard message.

## 2018-07-14 ENCOUNTER — Other Ambulatory Visit (HOSPITAL_COMMUNITY): Payer: Self-pay

## 2018-07-14 DIAGNOSIS — F411 Generalized anxiety disorder: Secondary | ICD-10-CM

## 2018-07-14 DIAGNOSIS — F330 Major depressive disorder, recurrent, mild: Secondary | ICD-10-CM

## 2018-07-14 MED ORDER — BUSPIRONE HCL 5 MG PO TABS: 5.0000 mg | ORAL_TABLET | Freq: Two times a day (BID) | ORAL | 0 refills | Status: DC

## 2018-07-14 MED ORDER — MIRTAZAPINE 45 MG PO TABS: 45.0000 mg | ORAL_TABLET | Freq: Every day | ORAL | 0 refills | Status: DC

## 2018-07-14 NOTE — Progress Notes (Signed)
Patient will be out of medication on 08-24-2018  before next appointment on 09/26/2018. Sent in enough tablets until next office visit

## 2018-07-18 DIAGNOSIS — G4730 Sleep apnea, unspecified: Secondary | ICD-10-CM | POA: Diagnosis not present

## 2018-07-18 DIAGNOSIS — G4733 Obstructive sleep apnea (adult) (pediatric): Secondary | ICD-10-CM | POA: Diagnosis not present

## 2018-07-20 ENCOUNTER — Ambulatory Visit: Payer: Medicare Other | Admitting: Internal Medicine

## 2018-07-24 DIAGNOSIS — J449 Chronic obstructive pulmonary disease, unspecified: Secondary | ICD-10-CM | POA: Diagnosis not present

## 2018-08-02 DIAGNOSIS — I519 Heart disease, unspecified: Secondary | ICD-10-CM | POA: Diagnosis not present

## 2018-08-02 DIAGNOSIS — I10 Essential (primary) hypertension: Secondary | ICD-10-CM | POA: Diagnosis not present

## 2018-08-02 DIAGNOSIS — E1169 Type 2 diabetes mellitus with other specified complication: Secondary | ICD-10-CM | POA: Diagnosis not present

## 2018-08-02 DIAGNOSIS — J449 Chronic obstructive pulmonary disease, unspecified: Secondary | ICD-10-CM | POA: Diagnosis not present

## 2018-08-09 ENCOUNTER — Telehealth: Payer: Self-pay | Admitting: Internal Medicine

## 2018-08-09 NOTE — Telephone Encounter (Signed)
Called and spoke with patient advised him that at this time we do not have samples. Patient verbalized understanding. Nothing further needed.

## 2018-08-24 DIAGNOSIS — J449 Chronic obstructive pulmonary disease, unspecified: Secondary | ICD-10-CM | POA: Diagnosis not present

## 2018-09-23 DIAGNOSIS — J449 Chronic obstructive pulmonary disease, unspecified: Secondary | ICD-10-CM | POA: Diagnosis not present

## 2018-09-26 ENCOUNTER — Encounter (HOSPITAL_COMMUNITY): Payer: Self-pay | Admitting: Psychiatry

## 2018-09-26 ENCOUNTER — Ambulatory Visit (INDEPENDENT_AMBULATORY_CARE_PROVIDER_SITE_OTHER): Payer: Medicare Other | Admitting: Psychiatry

## 2018-09-26 ENCOUNTER — Other Ambulatory Visit: Payer: Self-pay

## 2018-09-26 DIAGNOSIS — F411 Generalized anxiety disorder: Secondary | ICD-10-CM | POA: Diagnosis not present

## 2018-09-26 DIAGNOSIS — F330 Major depressive disorder, recurrent, mild: Secondary | ICD-10-CM

## 2018-09-26 MED ORDER — MIRTAZAPINE 45 MG PO TABS: 45.0000 mg | ORAL_TABLET | Freq: Every day | ORAL | 0 refills | Status: DC

## 2018-09-26 MED ORDER — BUSPIRONE HCL 5 MG PO TABS: 5.0000 mg | ORAL_TABLET | Freq: Two times a day (BID) | ORAL | 0 refills | Status: DC

## 2018-09-26 NOTE — Progress Notes (Signed)
Virtual Visit via Telephone Note  I connected with [name] on 09/26/18 at  2:00 PM EDT by telephone and verified that I am speaking with the correct person using two identifiers.   I discussed the limitations, risks, security and privacy concerns of performing an evaluation and management service by telephone and the availability of in person appointments. I also discussed with the patient that there may be a patient responsible charge related to this service. The patient expressed understanding and agreed to proceed.   History of Present Illness: Patient evaluated by phone.  He is feeling better since getting testosterone injection from his physician.  His energy level is improved.  He does not feel as fatigued and his motivation is also improved.  He is not taking Wellbutrin because he decided since testosterone helping his mood and energy he does not need Wellbutrin.  However he is compliant with Remeron and BuSpar.  He reported no tremors shakes or any EPS.  He is taking extra precaution due to COVID-19 and does not leave the house at all due to COPD.  He lives with his wife who is very supportive.  He denies any crying spells or any feeling of hopelessness or worthlessness.  His appetite is better.  Since taking the BuSpar he does not have any panic attack.   Past Psychiatric History: Reviewed. History of depression since 1990. One suicidal attempt . Tried Zoloft,Paxil,Prozac, Vistaril, trazodoneand Wellbutrin but stopped due to side effects. 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   Observations/Objective: Mental status examination done on the phone.  He described his mood good.  His speech is slow soft but clear and coherent.  His thought process logical.  There were no flight of ideas or loose association.  His attention and concentration is fair.  He denies any auditory or visual hallucination.  He denies any  active or passive suicidal thoughts or homicidal thought.  There were no delusions or any paranoia.  He is alert and oriented x3.  His cognition is fair.  His fund of knowledge is average.  His insight and judgment is okay.  Assessment and Plan: Major depressive disorder, recurrent.  Generalized anxiety disorder.  Patient doing better since started testosterone injection.  He is no longer taking Wellbutrin which was recommended to help his energy and motivation.  He like to continue BuSpar 5 mg twice a day and Remeron 45 mg at bedtime.  Discussed medication side effects and benefits.  Recommended to call us back if is any question or any concern.  Follow-up in 3 months.  Follow Up Instructions:    I discussed the assessment and treatment plan with the patient. The patient was provided an opportunity to ask questions and all were answered. The patient agreed with the plan and demonstrated an understanding of the instructions.   The patient was advised to call back or seek an in-person evaluation if the symptoms worsen or if the condition fails to improve as anticipated.  I provided 20 minutes of non-face-to-face time during this encounter.   Kathlee Nations, MD

## 2018-10-02 ENCOUNTER — Other Ambulatory Visit (HOSPITAL_COMMUNITY): Payer: Self-pay

## 2018-10-02 DIAGNOSIS — F330 Major depressive disorder, recurrent, mild: Secondary | ICD-10-CM

## 2018-10-02 MED ORDER — BUPROPION HCL 75 MG PO TABS: 75.0000 mg | ORAL_TABLET | ORAL | 0 refills | Status: DC

## 2018-10-14 ENCOUNTER — Other Ambulatory Visit (HOSPITAL_COMMUNITY): Payer: Self-pay | Admitting: Psychiatry

## 2018-10-14 DIAGNOSIS — F411 Generalized anxiety disorder: Secondary | ICD-10-CM

## 2018-10-14 DIAGNOSIS — F330 Major depressive disorder, recurrent, mild: Secondary | ICD-10-CM

## 2018-10-23 DIAGNOSIS — G4730 Sleep apnea, unspecified: Secondary | ICD-10-CM | POA: Diagnosis not present

## 2018-10-23 DIAGNOSIS — G4733 Obstructive sleep apnea (adult) (pediatric): Secondary | ICD-10-CM | POA: Diagnosis not present

## 2018-10-24 DIAGNOSIS — J449 Chronic obstructive pulmonary disease, unspecified: Secondary | ICD-10-CM | POA: Diagnosis not present

## 2018-11-07 ENCOUNTER — Telehealth: Payer: Self-pay | Admitting: Internal Medicine

## 2018-11-07 MED ORDER — SPIRIVA RESPIMAT 2.5 MCG/ACT IN AERS: 2.0000 | INHALATION_SPRAY | Freq: Every morning | RESPIRATORY_TRACT | 0 refills | Status: DC

## 2018-11-07 NOTE — Telephone Encounter (Signed)
Spoke with pt, he requested that I re-send the Rx for Spiriva to [facility] because they only sent him enough to do one puff per day when the directions are two puffs daily. I resent the Rx. Pt understood and nothing further is needed. Reminded him of his televisit appt on 11/13/2018 with MW.

## 2018-11-13 ENCOUNTER — Ambulatory Visit: Payer: Self-pay | Admitting: Internal Medicine

## 2018-11-13 ENCOUNTER — Ambulatory Visit (INDEPENDENT_AMBULATORY_CARE_PROVIDER_SITE_OTHER): Payer: Medicare Other | Admitting: Internal Medicine

## 2018-11-13 ENCOUNTER — Other Ambulatory Visit: Payer: Self-pay

## 2018-11-13 ENCOUNTER — Encounter: Payer: Self-pay | Admitting: Internal Medicine

## 2018-11-13 DIAGNOSIS — J9611 Chronic respiratory failure with hypoxia: Secondary | ICD-10-CM | POA: Diagnosis not present

## 2018-11-13 DIAGNOSIS — J449 Chronic obstructive pulmonary disease, unspecified: Secondary | ICD-10-CM

## 2018-11-13 NOTE — Assessment & Plan Note (Signed)
Quit smoking 11/2014  - PFT's 06/12/13  FEV1  1.15 (37%) with ratrio 43 and 14% better p B2 p no rx day of study and DLCO 29% -09/14/2013 med calendar > did not bring to office as requested 10/30/13 or 01/28/14 or 06/27/14  - 01/28/2014 p extensive coaching HFA effectiveness =    75%, 100% with dpi  - PFT's  02/10/2016  FEV1 1.06  (35 % ) ratio 44  p -6 % improvement from saba p prior to study with DLCO  24 % corrects to 37  % for alv volume   - 07/05/2017  After extensive coaching HFA effectiveness =    90% from a baseline of 75% (short Ti)    Adequate control on present rx, reviewed in detail with pt > no change in rx needed    Advised re seriousness of rec to maintain social distancing but should be doing some walking outside to maintain conditioning/ prevent blood clots   Each maintenance medication was reviewed in detail including most importantly the difference between maintenance and as needed and under what circumstances the prns are to be used.  Please see AVS for specific  Instructions which are unique to this visit and I personally typed out  which were reviewed in detail over the phone  with the patient and a copy provided.

## 2018-11-13 NOTE — Patient Instructions (Signed)
No change in medications but try to get more outdoor exercise when feasible to maintain your conditioning and prevent blood clots   Ok to adjust your 02 with exercise if needed to keep sats > 90% and call if can't maintain that minimum  level   See calendar for specific medication instructions and bring it back for each and every office visit for every healthcare provider you see.  Without it,  you may not receive the best quality medical care that we feel you deserve.  You will note that the calendar groups together  your maintenance  medications that are timed at particular times of the day.  Think of this as your checklist for what your doctor has instructed you to do until your next evaluation to see what benefit  there is  to staying on a consistent group of medications intended to keep you well.  The other group at the bottom is entirely up to you to use as you see fit  for specific symptoms that may arise between visits that require you to treat them on an as needed basis.  Think of this as your action plan or "what if" list.   Separating the top medications from the bottom group is fundamental to providing you adequate care going forward.    Please schedule a follow up visit in 6 months but call sooner if needed

## 2018-11-13 NOTE — Progress Notes (Signed)
Subjective:    Patient ID: [name], male    DOB: [birth year]  MRN: [id number]    Brief patient profile:  4 yobm quit smoking 12/03/14 on disability due to depression since 1994 baseline on spiriva with  doe room to room s 02 then underwent L THR 03/2013  at [facility] and noted even in hosp need 02 with any activity so referred to pulmonary clinic 05/01/2013 by [facility] with GOLD III copd with mild reversibility documented 06/12/13    History of Present Illness  05/01/2013 1st [facility] office visit/ [name] cc doe x room to room if not on 02 using walker ever since mobilized from surgery 04/03/13 s cough, some chest tightness once or twice per week esp after cigarette use, has not tried inhalers at home (other than spiriva)  but nebs in hosp seemed to help rec Start symbicort 160 Take 2 puffs first thing in am and then another 2 puffs about 12 hours later.  Continue spiriva one in am Work on inhaler technique:    Please remember to go to the lab and x-ray department downstairs for your tests - we will call you with the results when they are available. The key is to stop smoking completely before smoking completely stops you!      02/10/2016  f/u ov/[name] re: COPD GOLD III/ dulera 200/ spiriva /no med calendar  Chief Complaint  Patient presents with  . Follow-up    PFT's done today. Breathing has improved since the last visit.   ex x 15-20 min s stopping at 2lpm / 2lpm hs  rec Plan A = Automatic =  Performist(formoterol) with budesonide(Pulmonary)  = symbicort/dulera   Twice daily through [place] to use up your spiriva remaining if can't afford to refill it  Plan B = Backup Only use your albuterol (Proair) as rescue     12/29/2016  f/u ov/[name] re:  COPD GOLD III now on symb 160 2bid/ spiriva smi Chief Complaint  Patient presents with  . Follow-up    pt has good and bad days- sob with exertion, prod cough, chest tightness with exertion on bad days.  states today is a good  day.    ex for up to 15 min with therapist then rests but continues for up to an hour with sats upper 80s on 3lpm with ex/ 2lpm otherwise.  Never tries saba before ex but thinks it helps after he stops and rests  rec Goal with 02 is to keep the saturation over 90% so ok to increase  with exertion to maintain this sat Ok to try the proair x 2 pffs  15 min prior to exercise to see if there is any benefit  Work on inhaler technique:   See calendar for specific medication instructions .      07/05/2017  f/u ov/[name] re: copd  III/ 02 dep  Chief Complaint  Patient presents with  . Follow-up    6 month follow up for COPD. States his breathing capacity has increased since last visit. Does not get SOB as quickly as before.   Dyspnea:  Across the room on 3lpm / also limited by L hip pain  Cough: no Sleep: cpap and 3lpm  rec Use med calendar   Virtual Visit via Telephone Note 11/13/2018   I connected with [name] on 11/13/18 at  830 AM EDT by telephone and verified that I am speaking with the correct person using two identifiers.  I discussed the limitations, risks, security and privacy concerns of performing an evaluation and management service by telephone and the availability of in person appointments. I also discussed with the patient that there may be a patient responsible charge related to this service. The patient expressed understanding and agreed to proceed.   History of Present Illness: re copd III/ 02 dep on dulera 200 2bid/ spiriva, rare saba Dyspnea:  Just around the house/ not going out at all  Cough: min am not purulent Sleeping: cpap/ 3lpm ok on side bed is flat SABA use: rare 02: 3lpm   No obvious day to day or daytime variability or assoc excess/ purulent sputum or mucus plugs or hemoptysis or cp or chest tightness, subjective wheeze or overt sinus or hb symptoms.    Also denies any obvious fluctuation of symptoms with weather or environmental changes or other  aggravating or alleviating factors except as outlined above.   Meds reviewed/ med reconciliation completed       Observations/Objective: Sounds good, dry cough to voluntary maneuver / speaking full sentences with his typical speech pattern/ phonation     Assessment and Plan: See problem list for active a/p's   Follow Up Instructions: See avs for instructions unique to this ov which includes revised/ updated med list     I discussed the assessment and treatment plan with the patient. The patient was provided an opportunity to ask questions and all were answered. The patient agreed with the plan and demonstrated an understanding of the instructions.   The patient was advised to call back or seek an in-person evaluation if the symptoms worsen or if the condition fails to improve as anticipated.  I provided 25  minutes of non-face-to-face time during this encounter.   Christinia Gully, MD

## 2018-11-13 NOTE — Assessment & Plan Note (Signed)
Placed on 02 at discharge p THR 03/2013 3h/day and cpap at hs RA sats at rest 94% 06/27/14  so rec use 02 with activity only  - 11/29/2014   Walked RA x one lap @ 185 stopped due to hip pain/ sob with sat 88% > referred for POC  - 05/28/2015 sats ok at rest RA/  Walked on 3lpm  x one lap @ 185 stopped due to sob/ chest tightness but sats ok   As of 11/13/2018   3lpm 24/7   Advised to check sats walking to maintain > 90%

## 2018-11-15 DIAGNOSIS — E1169 Type 2 diabetes mellitus with other specified complication: Secondary | ICD-10-CM | POA: Diagnosis not present

## 2018-11-15 DIAGNOSIS — I10 Essential (primary) hypertension: Secondary | ICD-10-CM | POA: Diagnosis not present

## 2018-11-15 DIAGNOSIS — M130 Polyarthritis, unspecified: Secondary | ICD-10-CM | POA: Diagnosis not present

## 2018-11-15 DIAGNOSIS — J449 Chronic obstructive pulmonary disease, unspecified: Secondary | ICD-10-CM | POA: Diagnosis not present

## 2018-11-20 DIAGNOSIS — G4733 Obstructive sleep apnea (adult) (pediatric): Secondary | ICD-10-CM | POA: Diagnosis not present

## 2018-11-20 DIAGNOSIS — G4730 Sleep apnea, unspecified: Secondary | ICD-10-CM | POA: Diagnosis not present

## 2018-11-23 DIAGNOSIS — J449 Chronic obstructive pulmonary disease, unspecified: Secondary | ICD-10-CM | POA: Diagnosis not present

## 2018-11-28 ENCOUNTER — Other Ambulatory Visit: Payer: Self-pay | Admitting: Internal Medicine

## 2018-12-24 DIAGNOSIS — J449 Chronic obstructive pulmonary disease, unspecified: Secondary | ICD-10-CM | POA: Diagnosis not present

## 2018-12-27 ENCOUNTER — Ambulatory Visit (HOSPITAL_COMMUNITY): Payer: Medicare Other | Admitting: Psychiatry

## 2018-12-28 ENCOUNTER — Encounter (HOSPITAL_COMMUNITY): Payer: Self-pay | Admitting: Psychiatry

## 2018-12-28 ENCOUNTER — Other Ambulatory Visit: Payer: Self-pay

## 2018-12-28 ENCOUNTER — Ambulatory Visit (INDEPENDENT_AMBULATORY_CARE_PROVIDER_SITE_OTHER): Payer: Medicare Other | Admitting: Psychiatry

## 2018-12-28 DIAGNOSIS — F330 Major depressive disorder, recurrent, mild: Secondary | ICD-10-CM

## 2018-12-28 DIAGNOSIS — F411 Generalized anxiety disorder: Secondary | ICD-10-CM

## 2018-12-28 MED ORDER — MIRTAZAPINE 45 MG PO TABS: 45.0000 mg | ORAL_TABLET | Freq: Every day | ORAL | 0 refills | Status: DC

## 2018-12-28 MED ORDER — BUSPIRONE HCL 5 MG PO TABS: 5.0000 mg | ORAL_TABLET | Freq: Two times a day (BID) | ORAL | 0 refills | Status: DC

## 2018-12-28 NOTE — Progress Notes (Signed)
Virtual Visit via Telephone Note  I connected with [name] on 12/28/18 at  2:00 PM EDT by telephone and verified that I am speaking with the correct person using two identifiers.   I discussed the limitations, risks, security and privacy concerns of performing an evaluation and management service by telephone and the availability of in person appointments. I also discussed with the patient that there may be a patient responsible charge related to this service. The patient expressed understanding and agreed to proceed.   History of Present Illness: Patient was evaluated through virtual visit.  He is doing well on his medication.  The past 2 nights he admitted not able to sleep as good.  He is not sure but admitted that he is using vinegar and apple cider before he goes to sleep.  He is not taking Wellbutrin since his anxiety and depression is under control.  He is not irritable or any angry.  He does not leave his house due to [place] as patient has underlying COPD.  He lives with his wife who is very supportive.  He denies any crying spells or any feeling of hopelessness or worthlessness.  His energy level is fair.  His appetite is okay.  He denies any major panic attacks since taking the BuSpar.  Like to continue his current medication.  He is no longer taking Wellbutrin.   Past Psychiatric History:Reviewed. H/O depression since 19. Onesuicidal attempt. Tried Zoloft,Paxil,Prozac, Vistaril, trazodoneand Wellbutrin but stopped due to side effects. H/O using cocaine, THC and alcohol for 20 years. Claims to be sober since coming to this office.    Psychiatric Specialty Exam: Physical Exam  ROS  There were no vitals taken for this visit.There is no height or weight on file to calculate BMI.  General Appearance: NA  Eye Contact:  NA  Speech:  Slow  Volume:  Decreased  Mood:  Euthymic  Affect:  NA  Thought Process:  Descriptions of Associations: Intact  Orientation:  Full (Time,  Place, and Person)  Thought Content:  Rumination  Suicidal Thoughts:  No  Homicidal Thoughts:  No  Memory:  Immediate;   Fair Recent;   Fair Remote;   Fair  Judgement:  Fair  Insight:  Fair  Psychomotor Activity:  NA  Concentration:  Concentration: Fair and Attention Span: Fair  Recall:  [employer] of Knowledge:  Good  Language:  Fair  Akathisia:  No  Handed:  Right  AIMS (if indicated):     Assets:  Communication Skills Desire for Improvement Housing Resilience Social Support  ADL's:  Intact  Cognition:  WNL  Sleep:   fair      Assessment and Plan: Major depressive disorder, recurrent. GAD  Patient is a stable on his medication.  I recommend to stop the vinegar and apple cider to see if that is causing insomnia.  He agreed with the plan.  He like to continue his current medication.  He has no tremors, shakes or any EPS.  Continue BuSpar 5 mg twice a day and Remeron 45 mg at bedtime.  Recommended to call us back if he has any question or any concern.  Follow-up in 3 months.  Follow Up Instructions:    I discussed the assessment and treatment plan with the patient. The patient was provided an opportunity to ask questions and all were answered. The patient agreed with the plan and demonstrated an understanding of the instructions.   The patient was advised to call back or  seek an in-person evaluation if the symptoms worsen or if the condition fails to improve as anticipated.  I provided 20 minutes of non-face-to-face time during this encounter.   Kathlee Nations, MD

## 2019-01-24 DIAGNOSIS — J449 Chronic obstructive pulmonary disease, unspecified: Secondary | ICD-10-CM | POA: Diagnosis not present

## 2019-01-30 DIAGNOSIS — G4730 Sleep apnea, unspecified: Secondary | ICD-10-CM | POA: Diagnosis not present

## 2019-01-30 DIAGNOSIS — G4733 Obstructive sleep apnea (adult) (pediatric): Secondary | ICD-10-CM | POA: Diagnosis not present

## 2019-02-02 DIAGNOSIS — G4739 Other sleep apnea: Secondary | ICD-10-CM | POA: Diagnosis not present

## 2019-02-02 DIAGNOSIS — E1169 Type 2 diabetes mellitus with other specified complication: Secondary | ICD-10-CM | POA: Diagnosis not present

## 2019-02-02 DIAGNOSIS — M130 Polyarthritis, unspecified: Secondary | ICD-10-CM | POA: Diagnosis not present

## 2019-02-06 ENCOUNTER — Telehealth: Payer: Self-pay | Admitting: Internal Medicine

## 2019-02-06 NOTE — Telephone Encounter (Signed)
Received call back from pt via front desk. Spoke w/ pt about MW's recommendations below. Pt verbalized understanding and states he will not be able to make an appt until the first of October. Pt agreed to scheduling an appt for 02/20/2019 at 2:15 PM EDT with MW. Pt is aware to bring all medications to this appt. Appt has been scheduled. Nothing further needed at this time.

## 2019-02-06 NOTE — Telephone Encounter (Signed)
ATC pt, line went to voicemail. LMTCB.  Will hold in triage and try to call again later.

## 2019-02-06 NOTE — Telephone Encounter (Signed)
Called and spoke to pt. Pt states he titrated his O2 to 4lpm yesterday (baseline is 3lpm) to maintain is 90-91% spo2. Pt states he was given amoxicillin by PCP and has completed 5 days of this and has another 5 days to go to complete. Pt was given this for hemoptysis that started 2 weeks ago. Pt states he would produce about 1/2 tsp per day. Pt states the hemoptysis has resolved a few days ago and hasnt coughed in the last 24 hours. Pt states overall his DOE is better than before the abx but still not back to baseline. Pt is concerned about his increased need for O2. Pt is questioning if MW would recommend anything different to help or to just monitor symptoms. Advised pt since he is improving to just monitor symptoms and to complete the abx and to call back if he is not improving or worsening. Pt verbalized understanding but still requests message be sent to MW. Pt denies known COVID contact, f/c/s, CP, chest congestion.   Dr. Melvyn Novas please advise. Thank you.

## 2019-02-06 NOTE — Telephone Encounter (Signed)
Ov with all meds in hand in about a week to let the full benefit of prior treatment take effect

## 2019-02-20 ENCOUNTER — Ambulatory Visit: Payer: Medicare Other | Admitting: Internal Medicine

## 2019-02-20 ENCOUNTER — Ambulatory Visit (INDEPENDENT_AMBULATORY_CARE_PROVIDER_SITE_OTHER): Payer: Medicare Other | Admitting: Internal Medicine

## 2019-02-20 ENCOUNTER — Encounter: Payer: Self-pay | Admitting: Internal Medicine

## 2019-02-20 ENCOUNTER — Other Ambulatory Visit: Payer: Self-pay

## 2019-02-20 DIAGNOSIS — J9611 Chronic respiratory failure with hypoxia: Secondary | ICD-10-CM

## 2019-02-20 DIAGNOSIS — J449 Chronic obstructive pulmonary disease, unspecified: Secondary | ICD-10-CM | POA: Diagnosis not present

## 2019-02-20 NOTE — Progress Notes (Signed)
Subjective:    Patient ID: [name], male    DOB: Dec 21, 1950  MRN: [id number]    Brief patient profile:  65 yobm quit smoking 12/03/14 on disability due to depression since 1994 baseline on spiriva with  doe room to room s 02 then underwent L THR 03/2013  at [facility] and noted even in hosp need 02 with any activity so referred to pulmonary clinic 05/01/2013 by [facility] with GOLD III copd with mild reversibility documented 06/12/13    History of Present Illness  05/01/2013 1st [facility] office visit/ Laloni Rowton cc doe x room to room if not on 02 using walker ever since mobilized from surgery 04/03/13 s cough, some chest tightness once or twice per week esp after cigarette use, has not tried inhalers at home (other than spiriva)  but nebs in hosp seemed to help rec Start symbicort 160 Take 2 puffs first thing in am and then another 2 puffs about 12 hours later.  Continue spiriva one in am Work on inhaler technique:    Please remember to go to the lab and x-ray department downstairs for your tests - we will call you with the results when they are available. The key is to stop smoking completely before smoking completely stops you!      02/10/2016  f/u ov/Maysoon Lozada re: COPD GOLD III/ dulera 200/ spiriva /no med calendar  Chief Complaint  Patient presents with  . Follow-up    PFT's done today. Breathing has improved since the last visit.   ex x 15-20 min s stopping at 2lpm / 2lpm hs  rec Plan A = Automatic =  Performist(formoterol) with budesonide(Pulmonary)  = symbicort/dulera   Twice daily through [place] to use up your spiriva remaining if can't afford to refill it  Plan B = Backup Only use your albuterol (Proair) as rescue     12/29/2016  f/u ov/Gregoire Bennis re:  COPD GOLD III now on symb 160 2bid/ spiriva smi Chief Complaint  Patient presents with  . Follow-up    pt has good and bad days- sob with exertion, prod cough, chest tightness with exertion on bad days.  states today is a good  day.    ex for up to 15 min with therapist then rests but continues for up to an hour with sats upper 80s on 3lpm with ex/ 2lpm otherwise.  Never tries saba before ex but thinks it helps after he stops and rests  rec Goal with 02 is to keep the saturation over 90% so ok to increase  with exertion to maintain this sat Ok to try the proair x 2 pffs  15 min prior to exercise to see if there is any benefit  Work on inhaler technique:   See calendar for specific medication instructions .      07/05/2017  f/u ov/Mazelle Huebert re: copd  III/ 02 dep  Chief Complaint  Patient presents with  . Follow-up    6 month follow up for COPD. States his breathing capacity has increased since last visit. Does not get SOB as quickly as before.   Dyspnea:  Across the room on 3lpm / also limited by L hip pain  Cough: no Sleep: cpap and 3lpm  rec Use med calendar   Virtual Visit via Telephone Note 11/13/2018   I connected with [name] on 11/13/18 at  230 PM EDT by telephone and verified that I am speaking with the correct person using two identifiers.  I discussed the limitations, risks, security and privacy concerns of performing an evaluation and management service by telephone and the availability of in person appointments. I also discussed with the patient that there may be a patient responsible charge related to this service. The patient expressed understanding and agreed to proceed.   History of Present Illness: re copd III/ 02 dep on dulera 200 2bid/ spiriva, rare saba Dyspnea:  Just around the house/ not going out at all  Cough: min am not purulent Sleeping: cpap/ 3lpm ok on side bed is flat SABA use: rare 02: 3lpm rec No change in medications but try to get more outdoor exercise when feasible to maintain your conditioning and prevent blood clots  Ok to adjust your 02 with exercise if needed to keep sats > 90% and call if can't maintain that minimum  Level   Virtual Visit via Telephone Note  02/20/2019   I connected with [name] on 02/20/19 at  2:20 PM EDT by telephone and verified that I am speaking with the correct person using two identifiers.   I discussed the limitations, risks, security and privacy concerns of performing an evaluation and management service by telephone and the availability of in person appointments. I also discussed with the patient that there may be a patient responsible charge related to this service. The patient expressed understanding and agreed to proceed.   History of Present Illness: Dyspnea:  Around the house room to room  Cough: minimal p rx amox rx per bland Sleeping: ok on cpap / 3lpm bleeding in / lie flat ok on 2 pillows  SABA use: maybe once a week at most   02: 3-4 lpm 24/7   No obvious day to day or daytime variability or assoc excess/ purulent sputum or mucus plugs or hemoptysis or cp or chest tightness, subjective wheeze or overt sinus or hb symptoms.    Also denies any obvious fluctuation of symptoms with weather or environmental changes or other aggravating or alleviating factors except as outlined above.   Meds reviewed/ med reconciliation completed        Observations/Objective: Speaking full phrases / good phonation    Assessment and Plan: See problem list for active a/p's   Follow Up Instructions: See avs for instructions unique to this ov which includes revised/ updated med list     I discussed the assessment and treatment plan with the patient. The patient was provided an opportunity to ask questions and all were answered. The patient agreed with the plan and demonstrated an understanding of the instructions.   The patient was advised to call back or seek an in-person evaluation if the symptoms worsen or if the condition fails to improve as anticipated.  I provided 24 minutes of non-face-to-face time during this encounter.   Christinia Gully, MD

## 2019-02-20 NOTE — Patient Instructions (Signed)
Pt has MyChart access     1) Albuterol  is for relief of sob that does not improve by walking a slower pace or resting but rather if the pt does not improve after trying this first. 2) If you are  convinced, as many are, that albuterol reproducibly improves your breathing with a particular activity >>>   try the saba 15 min before the activity on alternate days   If in fact the albuterol  really does help, then fine to continue to use it as needed  but advised may need to look closer at the maintenance regimen being used to achieve better control of airways disease with exertion which we will address at the next visit.   Please schedule a follow up visit in 3 months but call sooner if needed  with all medications /inhalers/ solutions in hand so we can verify exactly what you are taking. This includes all medications from all doctors and over the counters

## 2019-02-20 NOTE — Assessment & Plan Note (Signed)
Quit smoking 11/2014  - PFT's 06/12/13  FEV1  1.15 (37%) with ratrio 43 and 14% better p B2 p no rx day of study and DLCO 29% -09/14/2013 med calendar > did not bring to office as requested 10/30/13 or 01/28/14 or 06/27/14  - 01/28/2014 p extensive coaching HFA effectiveness =    75%, 100% with dpi  - PFT's  02/10/2016  FEV1 1.06  (35 % ) ratio 44  p -6 % improvement from saba p prior to study with DLCO  24 % corrects to 37  % for alv volume   - 07/05/2017  After extensive coaching HFA effectiveness =    90% from a baseline of 75% (short Ti)   Adequate control on present rx, reviewed in detail with pt > no change in rx needed    Reviewed: I spent extra time with pt today reviewing appropriate use of albuterol for prn use on exertion with the following points: 1) saba is for relief of sob that does not improve by walking a slower pace or resting but rather if the pt does not improve after trying this first. 2) If the pt is convinced, as many are, that saba helps recover from activity faster then it's easy to tell if this is the case by re-challenging : ie stop, take the inhaler, then p 5 minutes try the exact same activity (intensity of workload) that just caused the symptoms and see if they are substantially diminished or not after saba 3) if there is an activity that reproducibly causes the symptoms, try the saba 15 min before the activity on alternate days   If in fact the saba really does help, then fine to continue to use it prn but advised may need to look closer at the maintenance regimen being used to achieve better control of airways disease with exertion.

## 2019-02-23 DIAGNOSIS — J449 Chronic obstructive pulmonary disease, unspecified: Secondary | ICD-10-CM | POA: Diagnosis not present

## 2019-03-14 ENCOUNTER — Telehealth (HOSPITAL_COMMUNITY): Payer: Self-pay

## 2019-03-14 NOTE — Telephone Encounter (Signed)
Patient called and stated that his granddaughter is stressing him out and he is experiencing increased anxiety and depression. He stated that he needs to get her into a shelter but doesn't know what to do and would like advice. Please review and advise. Thank you.

## 2019-03-14 NOTE — Telephone Encounter (Signed)
He need to call social services.

## 2019-03-19 NOTE — Telephone Encounter (Signed)
Relayed message to patient. Gave him the phone number and address for [facility]. He stated he was going to call them

## 2019-03-26 DIAGNOSIS — J449 Chronic obstructive pulmonary disease, unspecified: Secondary | ICD-10-CM | POA: Diagnosis not present

## 2019-03-29 ENCOUNTER — Ambulatory Visit (HOSPITAL_COMMUNITY): Payer: Medicare Other | Admitting: Psychiatry

## 2019-03-30 ENCOUNTER — Other Ambulatory Visit: Payer: Self-pay

## 2019-03-30 ENCOUNTER — Encounter (HOSPITAL_COMMUNITY): Payer: Self-pay | Admitting: Psychiatry

## 2019-03-30 ENCOUNTER — Ambulatory Visit (INDEPENDENT_AMBULATORY_CARE_PROVIDER_SITE_OTHER): Payer: Medicare Other | Admitting: Psychiatry

## 2019-03-30 DIAGNOSIS — F411 Generalized anxiety disorder: Secondary | ICD-10-CM | POA: Diagnosis not present

## 2019-03-30 DIAGNOSIS — F330 Major depressive disorder, recurrent, mild: Secondary | ICD-10-CM

## 2019-03-30 MED ORDER — MIRTAZAPINE 45 MG PO TABS: 45.0000 mg | ORAL_TABLET | Freq: Every day | ORAL | 0 refills | Status: DC

## 2019-03-30 MED ORDER — BUSPIRONE HCL 5 MG PO TABS: 5.0000 mg | ORAL_TABLET | Freq: Two times a day (BID) | ORAL | 0 refills | Status: DC

## 2019-03-30 NOTE — Progress Notes (Signed)
Virtual Visit via Telephone Note  I connected with [name] on 03/30/19 at 10:40 AM EST by telephone and verified that I am speaking with the correct person using two identifiers.   I discussed the limitations, risks, security and privacy concerns of performing an evaluation and management service by telephone and the availability of in person appointments. I also discussed with the patient that there may be a patient responsible charge related to this service. The patient expressed understanding and agreed to proceed.   History of Present Illness: Patient was evaluated through phone.  He is doing well on his medication.  He is relieved that his granddaughter is not causing any issues and things are going well.  He is sleeping good.  We feel his anxiety and depression is a stable.  He has no tremors, shakes or any EPS.  He denies any feeling of hopelessness or worthlessness.  His energy level is okay.  His appetite is fine.  He denies any paranoia or any hallucination.  He has no tremors shakes or any other side effects.  He likes to continue BuSpar and Remeron.   Past Psychiatric History:Reviewed. H/O depression since 23. Onesuicidal attempt. Tried Zoloft,Paxil,Prozac, Vistaril, trazodoneand Wellbutrin but stopped due to side effects. H/O using cocaine, THC and alcohol for 20 years. Claims to be sober since coming to this office.   Psychiatric Specialty Exam: Physical Exam  ROS  There were no vitals taken for this visit.There is no height or weight on file to calculate BMI.  General Appearance: NA  Eye Contact:  NA  Speech:  Clear and Coherent and Slow  Volume:  Normal  Mood:  Euthymic  Affect:  NA  Thought Process:  Goal Directed  Orientation:  Full (Time, Place, and Person)  Thought Content:  Logical  Suicidal Thoughts:  No  Homicidal Thoughts:  No  Memory:  Immediate;   Good Recent;   Good Remote;   Fair  Judgement:  Good  Insight:  Good  Psychomotor Activity:   NA  Concentration:  Concentration: Fair and Attention Span: Fair  Recall:  [employer] of Knowledge:  Good  Language:  Good  Akathisia:  No  Handed:  Right  AIMS (if indicated):     Assets:  Communication Skills Desire for Improvement Housing  ADL's:  Intact  Cognition:  WNL  Sleep:   ok      Assessment and Plan: Major depressive disorder, recurrent.  Generalized anxiety disorder.  Patient doing well on his current medication.  He is sleeping better since he stopped the vinegar and apple cider.  Discussed medication side effects and benefits.  Continue BuSpar 5 mg twice a day and Remeron 45 [place] at bedtime.  He is pleased that his granddaughter is not causing any more issues.  Recommended to call us back if is any question or any concern.  Follow-up in 3 months.  Follow Up Instructions:    I discussed the assessment and treatment plan with the patient. The patient was provided an opportunity to ask questions and all were answered. The patient agreed with the plan and demonstrated an understanding of the instructions.   The patient was advised to call back or seek an in-person evaluation if the symptoms worsen or if the condition fails to improve as anticipated.  I provided 15 minutes of non-face-to-face time during this encounter.   Edward Nations, MD

## 2019-04-05 ENCOUNTER — Other Ambulatory Visit (HOSPITAL_COMMUNITY): Payer: Self-pay | Admitting: Psychiatry

## 2019-04-05 DIAGNOSIS — F411 Generalized anxiety disorder: Secondary | ICD-10-CM

## 2019-04-05 DIAGNOSIS — F330 Major depressive disorder, recurrent, mild: Secondary | ICD-10-CM

## 2019-04-16 ENCOUNTER — Encounter: Payer: Self-pay | Admitting: Primary Care

## 2019-04-16 ENCOUNTER — Ambulatory Visit (INDEPENDENT_AMBULATORY_CARE_PROVIDER_SITE_OTHER): Payer: Medicare Other | Admitting: Primary Care

## 2019-04-16 DIAGNOSIS — J9611 Chronic respiratory failure with hypoxia: Secondary | ICD-10-CM | POA: Diagnosis not present

## 2019-04-16 DIAGNOSIS — J441 Chronic obstructive pulmonary disease with (acute) exacerbation: Secondary | ICD-10-CM | POA: Diagnosis not present

## 2019-04-16 MED ORDER — PREDNISONE 10 MG PO TABS: ORAL_TABLET | ORAL | 0 refills | Status: DC

## 2019-04-16 MED ORDER — AZITHROMYCIN 250 MG PO TABS: ORAL_TABLET | ORAL | 0 refills | Status: DC

## 2019-04-16 NOTE — Patient Instructions (Signed)
  Chronic respiratory failure with hypoxia COPD exacerbation: - Recommend CXR but patient is unable to have done  - Treating for suspected COPD exacerbation with Zpack and prednisone x 5 days - Continue Mucinex twice daily  - Advised patient monitor O2 level and if consistently <88% on 5L recommend ED eval  - Follow up in two days

## 2019-04-16 NOTE — Progress Notes (Signed)
Virtual Visit via Telephone Note  I connected with [name] on 04/16/19 at  3:30 PM EST by telephone and verified that I am speaking with the correct person using two identifiers.  Location: Patient: Home Provider: Home   I discussed the limitations, risks, security and privacy concerns of performing an evaluation and management service by telephone and the availability of in person appointments. I also discussed with the patient that there may be a patient responsible charge related to this service. The patient expressed understanding and agreed to proceed.   History of Present Illness: 68 year old male, former smoker. PMH COPD GOLD II, chronic respiratory failure with hypoxia, depression, elevated PSA. Patient of Dr. Melvyn Novas, last seen on 02/20/19.    04/16/2019 Patient reports decreased oxygen levels over the last x2 days. He had to increase his oxygen form 3.5-4 to 5L. He does not report any shortness of breath. He has a slight cough with some mucus production. He is taking mucinex twice a day. Using Dulera and Spiriva. Thinks he may have ran out of Dulera 3 days ago and got a new one last night. He used his rescue inhaler once today with no perceived benefit because he is not short of breath. Denies wheezing or chest tightness. Recommending CXR but patient unable to get there. Advised ED evaluation but patient declined at this time. Denies fever, chills, sick contact, sinusitis, chest tightness, chest pain.   Observations/Objective:  O2 87-92% on 5L  Assessment and Plan:  Chronic respiratory failure with hypoxia COPD exacerbation: - Increased oxygen demand from 3.5L to 5L for the last 2 days without shortness of breath - Recommend CXR but patient is unable to have done - Treating for suspected COPD exacerbation with Zpack and prednisone x 5 days - Continue Mucinex twice daily  - Advised patient monitor O2 level and if consistently <88% on 5L recommend ED eval   Follow Up  Instructions:   - FU in 2 days    I discussed the assessment and treatment plan with the patient. The patient was provided an opportunity to ask questions and all were answered. The patient agreed with the plan and demonstrated an understanding of the instructions.   The patient was advised to call back or seek an in-person evaluation if the symptoms worsen or if the condition fails to improve as anticipated.  I provided 22 minutes of non-face-to-face time during this encounter.   Martyn Ehrich, NP

## 2019-04-17 ENCOUNTER — Telehealth: Payer: Self-pay | Admitting: Primary Care

## 2019-04-17 MED ORDER — PREDNISONE 10 MG PO TABS: ORAL_TABLET | ORAL | 0 refills | Status: DC

## 2019-04-17 MED ORDER — AZITHROMYCIN 250 MG PO TABS: ORAL_TABLET | ORAL | 0 refills | Status: DC

## 2019-04-17 NOTE — Telephone Encounter (Signed)
I have sent this to the correct pharmacy and made patient aware. Patient states that he has canceled the scripts with [facility].

## 2019-04-17 NOTE — Telephone Encounter (Signed)
Sorry about that, thanks

## 2019-04-18 ENCOUNTER — Ambulatory Visit (INDEPENDENT_AMBULATORY_CARE_PROVIDER_SITE_OTHER): Payer: Medicare Other | Admitting: Primary Care

## 2019-04-18 ENCOUNTER — Other Ambulatory Visit: Payer: Self-pay

## 2019-04-18 ENCOUNTER — Encounter: Payer: Self-pay | Admitting: Primary Care

## 2019-04-18 DIAGNOSIS — J441 Chronic obstructive pulmonary disease with (acute) exacerbation: Secondary | ICD-10-CM

## 2019-04-18 DIAGNOSIS — J9611 Chronic respiratory failure with hypoxia: Secondary | ICD-10-CM

## 2019-04-18 NOTE — Progress Notes (Signed)
Virtual Visit via Telephone Note  I connected with [name] on 04/18/19 at  3:30 PM EST by telephone and verified that I am speaking with the correct person using two identifiers.  Location: Patient: Home Provider: Office   I discussed the limitations, risks, security and privacy concerns of performing an evaluation and management service by telephone and the availability of in person appointments. I also discussed with the patient that there may be a patient responsible charge related to this service. The patient expressed understanding and agreed to proceed.   History of Present Illness: 68 year old male, former smoker. PMH COPD GOLD II, chronic respiratory failure with hypoxia, depression, elevated PSA. Patient of Dr. Melvyn Novas, last seen on 02/20/19.   Previous [facility] pulmonary encounter: 04/16/2019 Patient reports decreased oxygen levels over the last x2 days. He had to increase his oxygen form 3.5-4 to 5L. He does not report any shortness of breath. He has a slight cough with some mucus production. He is taking mucinex twice a day. Using Dulera and Spiriva. Thinks he may have ran out of Dulera 3 days ago and got a new one last night. He used his rescue inhaler once today with no perceived benefit because he is not short of breath. Denies wheezing or chest tightness. Recommending CXR but patient unable to get there. Advised ED evaluation but patient declined at this time. Denies fever, chills, sick contact, sinusitis, chest tightness, chest pain.   04/18/2019 Patient contacted today for 2 day follow-up televisit. Currently treating for suspected COPD exacerbation with Azithromycin and prednisone. Patient was unable to have CXR done and wanted to avoid hospitalization. He reported increased oxygen demand from 3.5L to 5L without acute shortness of breath but some cough. His symptoms have not worsened. He is on day two of azithromycin and prednisone course. He has started to bring up some clear  mucus when he coughs. Taking mucinex as prescribed. He has not required his Albuterol rescue inhaler. O2 levels maintaining between 90-91% on 5L. Remains afebrile.    Observations/Objective:  - 90-91% 5L - Able to speak in full sentences - No cough or wheeze noted over phone call   Assessment and Plan:  Acute on chronic respiratory failure with hypoxia COPD exacerbation: - Respiratory status appears stable at this time, maintaining O2 >90% on 5L - Declined ED eval earlier this week and not able to have CXR done  - No clear shortness of breath, +productive cough. Treating for suspected COPD exacerbation with Azithromycin and prednisone x 5 days - Continues Dulera two puffs twice daily; prn albuterol q 6 hours for sob - Continue mucinex 600mg  twice daily  - Continue to monitor respiratory symptoms, if breathing worsens or O2 sustaining <88% on 5L needs ED evaluation by EMS   Follow Up Instructions:   - FU on Monday 12/7 for televisit   I discussed the assessment and treatment plan with the patient. The patient was provided an opportunity to ask questions and all were answered. The patient agreed with the plan and demonstrated an understanding of the instructions.   The patient was advised to call back or seek an in-person evaluation if the symptoms worsen or if the condition fails to improve as anticipated.  I provided 16 minutes of non-face-to-face time during this encounter.   Edward Ehrich, NP

## 2019-04-23 ENCOUNTER — Other Ambulatory Visit: Payer: Self-pay

## 2019-04-23 ENCOUNTER — Ambulatory Visit (INDEPENDENT_AMBULATORY_CARE_PROVIDER_SITE_OTHER): Payer: Medicare Other | Admitting: Primary Care

## 2019-04-23 ENCOUNTER — Encounter: Payer: Self-pay | Admitting: Primary Care

## 2019-04-23 DIAGNOSIS — J9611 Chronic respiratory failure with hypoxia: Secondary | ICD-10-CM | POA: Diagnosis not present

## 2019-04-23 DIAGNOSIS — J449 Chronic obstructive pulmonary disease, unspecified: Secondary | ICD-10-CM

## 2019-04-23 DIAGNOSIS — R109 Unspecified abdominal pain: Secondary | ICD-10-CM

## 2019-04-23 NOTE — Progress Notes (Signed)
Virtual Visit via Telephone Note  I connected with [name] on 04/23/19 at 11:00 AM EST by telephone and verified that I am speaking with the correct person using two identifiers.  Location: Patient: Home Provider: Office   I discussed the limitations, risks, security and privacy concerns of performing an evaluation and management service by telephone and the availability of in person appointments. I also discussed with the patient that there may be a patient responsible charge related to this service. The patient expressed understanding and agreed to proceed.   History of Present Illness: 68 year old male, former smoker. PMH COPD GOLD II, chronic respiratory failure with hypoxia, depression, elevated PSA. Patient of Dr. Melvyn Novas, last seen on 02/20/19.   Previous [facility] pulmonary encounter: 04/16/2019 Patient reports decreased oxygen levels over the last x2 days. He had to increase his oxygen form 3.5-4 to 5L. He does not report any shortness of breath. He has a slight cough with some mucus production. He is taking mucinex twice a day. Using Dulera and Spiriva. Thinks he may have ran out of Dulera 3 days ago and got a new one last night. He used his rescue inhaler once today with no perceived benefit because he is not short of breath. Denies wheezing or chest tightness. Recommending CXR but patient unable to get there. Advised ED evaluation but patient declined at this time. Denies fever, chills, sick contact, sinusitis, chest tightness, chest pain.   04/18/2019 Patient contacted today for 2 day follow-up televisit. Currently treating for suspected COPD exacerbation with Azithromycin and prednisone. Patient was unable to have CXR done and wanted to avoid hospitalization. He reported increased oxygen demand from 3.5L to 5L without acute shortness of breath but some cough. His symptoms have not worsened. He is on day two of azithromycin and prednisone course. He has started to bring up some clear  mucus when he coughs. Taking mucinex as prescribed. He has not required his Albuterol rescue inhaler. O2 levels maintaining between 90-91% on 5L. Remains afebrile.    04/23/2019 Patient contacted today for 1 week follow-up for COPD exacerbation/increased O2 demand. He is doing better. Finish abx and prednisone two days ago. His cough has resolved. He is requiring less oxygen. He was unable to get CXR. Continues Dulera 200 twice daily and Spiriva Respimat. He has not needed his Albuterol rescue inhaler. Reports decreased appetite for 10 days. He had some abdominal pain 2-3 days ago which has now resolved. States that he has been having regular BMs. Taking in protein shakes. No swelling/edema or weight gain that he is aware of. Denies fever, chills, change in taste/smell, shortness of breath, cough or chest tightness or pain.  Observations/Objective:   Patient reported VS: - O2 is 92% on 4L - No shortness of breath, wheezing or cough noted during phone conversation - Able to speak in full sentences, muffled voice/ difficult to hear at times  Assessment and Plan:  COPD exacerbation: - Cough has resolved. Never had shortness of breath. - Declined CXR during initial televisit  - Completed course of azithromycin and prednisone 20mg  x 5 days - Continue Dulera 200 and Spiriva Respimat   Chronic respiratory failure with hypoxia: - Improved/stable; O2 92% on 4L (baseline 3.5-4L) - If continues to require more oxygen than baseline patient needs CXR   Decreased appetite: - Unclear cause, possibly viral  - Associated abdominal pain 2-3 days ago which has resolved - No change in BM, vomiting or fevers. Denies weight changes.  - Instructed  patient to follow-up with PCP, if persistent recommend basic labs   Follow Up Instructions:  - Due for follow-up in January with Dr. Melvyn Novas    I discussed the assessment and treatment plan with the patient. The patient was provided an opportunity to ask questions  and all were answered. The patient agreed with the plan and demonstrated an understanding of the instructions.   The patient was advised to call back or seek an in-person evaluation if the symptoms worsen or if the condition fails to improve as anticipated.  I provided 25 minutes of non-face-to-face time during this encounter.   Martyn Ehrich, NP

## 2019-04-23 NOTE — Patient Instructions (Addendum)
COPD: - Continue Dulera 200 two puffs twice daily - Continue Spiriva Respimat two puffs once daily   Chronic respiratory failure with hypoxia: - Improved, requiring less oxygen - O2 92% on 4L  - If you continue to require more oxygen than your baseline you will need a CXR   Decreased appetite/abdominal pain: - Recommend you follow-up with PCP ([name]), if persistent recommend basic labs   Follow-up: Due for visit with Dr. Melvyn Novas in January

## 2019-04-25 DIAGNOSIS — J449 Chronic obstructive pulmonary disease, unspecified: Secondary | ICD-10-CM | POA: Diagnosis not present

## 2019-04-30 ENCOUNTER — Telehealth: Payer: Self-pay | Admitting: Cardiology

## 2019-04-30 DIAGNOSIS — G4733 Obstructive sleep apnea (adult) (pediatric): Secondary | ICD-10-CM | POA: Diagnosis not present

## 2019-04-30 DIAGNOSIS — G4730 Sleep apnea, unspecified: Secondary | ICD-10-CM | POA: Diagnosis not present

## 2019-04-30 NOTE — Telephone Encounter (Signed)
*  STAT* If patient is at the pharmacy, call can be transferred to refill team.   1. Which medications need to be refilled? (please list name of each medication and dose if known) nitroglycerin  2. Which pharmacy/location (including street and city if local pharmacy) is medication to be sent to?[facility] [address] in [place]  3. Do they need a 30 day or 90 day supply? [place]

## 2019-05-02 ENCOUNTER — Telehealth: Payer: Self-pay | Admitting: Cardiology

## 2019-05-02 ENCOUNTER — Other Ambulatory Visit (HOSPITAL_COMMUNITY): Payer: Self-pay | Admitting: Psychiatry

## 2019-05-02 ENCOUNTER — Other Ambulatory Visit: Payer: Self-pay | Admitting: Cardiology

## 2019-05-02 ENCOUNTER — Telehealth: Payer: Self-pay | Admitting: Cardiovascular Disease

## 2019-05-02 DIAGNOSIS — F330 Major depressive disorder, recurrent, mild: Secondary | ICD-10-CM

## 2019-05-02 DIAGNOSIS — F411 Generalized anxiety disorder: Secondary | ICD-10-CM

## 2019-05-02 MED ORDER — NITROGLYCERIN 0.4 MG SL SUBL: 0.4000 mg | SUBLINGUAL_TABLET | SUBLINGUAL | 0 refills | Status: DC | PRN

## 2019-05-02 NOTE — Telephone Encounter (Signed)
I spoke to the patient and because of mobility issues, his wife will need to accompany him to his OV with Dr Marlou Porch.

## 2019-05-02 NOTE — Telephone Encounter (Signed)
Patient called back. Informed patient that would be fine for his wife to come since he needs help. Will make note on appointment visit note.

## 2019-05-02 NOTE — Telephone Encounter (Signed)
Left message for patient to call back  

## 2019-05-02 NOTE — Telephone Encounter (Signed)
New Message     Pt is calling and says his wife will need to assist him to his appt in Jan with Dr Marlou Porch because he has COPD, on oxygen, and uses a wheel chair

## 2019-05-02 NOTE — Telephone Encounter (Signed)
Called pt back re: refill of his Nitroglycerin.  Pt understands that we will send in 90 days supply once he sees Dr. Marlou Porch as scheduled. Pt very grateful.

## 2019-05-02 NOTE — Telephone Encounter (Signed)
error 

## 2019-05-02 NOTE — Telephone Encounter (Signed)
Patient returning call.

## 2019-05-02 NOTE — Telephone Encounter (Signed)
New Message       *STAT* If patient is at the pharmacy, call can be transferred to refill team.   1. Which medications need to be refilled? (please list name of each medication and dose if known) nitroGLYCERIN (NITROSTAT) 0.4 MG SL tablet  2. Which pharmacy/location (including street and city if local pharmacy) is medication to be sent to? [facility] [address]   3. Do they need a 30 day or 90 day supply? 30    Pt is out of medication and would like to get today

## 2019-05-02 NOTE — Telephone Encounter (Addendum)
Follow up   Patient following up on refill request. He has no medication remaining. Patient states he was told to contact [facility] for refills

## 2019-05-03 ENCOUNTER — Other Ambulatory Visit: Payer: Self-pay

## 2019-05-03 MED ORDER — NITROGLYCERIN 0.4 MG SL SUBL: 0.4000 mg | SUBLINGUAL_TABLET | SUBLINGUAL | 1 refills | Status: DC | PRN

## 2019-05-07 ENCOUNTER — Telehealth: Payer: Self-pay | Admitting: Internal Medicine

## 2019-05-07 NOTE — Telephone Encounter (Signed)
Patient is a Patient of Dr. Gustavus Bryant.  Will route message to [place] as FYI.

## 2019-05-07 NOTE — Telephone Encounter (Signed)
[name], message sent to you as an FYI. Not sure if you have received the paperwork yet.

## 2019-05-08 NOTE — Telephone Encounter (Signed)
Magda Paganini is not in the office this week. Danae Chen worked with Dr. Melvyn Novas yesterday 12/21 and Hinton Dyer is currently working with him today. Danae Chen or Hinton Dyer, please advise if you have seen forms that were dropped off that need to be filled out by Dr. Melvyn Novas.

## 2019-05-09 NOTE — Telephone Encounter (Signed)
Checked front desk and there are no DMV forms. Check with Hyman Hopes (front desk supervisor) and the form was not received by her either. Will have to check with Dr. Melvyn Novas in the morning, they may be in his office.

## 2019-05-10 NOTE — Telephone Encounter (Signed)
Located and placed in your lookat to be signed thanks

## 2019-05-10 NOTE — Telephone Encounter (Signed)
Dr. Melvyn Novas, please advise if you remember if the [facility] form was taken care of?

## 2019-05-10 NOTE — Telephone Encounter (Signed)
I am not aware of it

## 2019-05-10 NOTE — Telephone Encounter (Signed)
Done

## 2019-05-14 ENCOUNTER — Other Ambulatory Visit: Payer: Self-pay | Admitting: General Surgery

## 2019-05-14 ENCOUNTER — Telehealth: Payer: Self-pay | Admitting: Internal Medicine

## 2019-05-14 DIAGNOSIS — J449 Chronic obstructive pulmonary disease, unspecified: Secondary | ICD-10-CM

## 2019-05-14 MED ORDER — SPIRIVA RESPIMAT 2.5 MCG/ACT IN AERS: 2.0000 | INHALATION_SPRAY | Freq: Every day | RESPIRATORY_TRACT | 0 refills | Status: DC

## 2019-05-14 NOTE — Telephone Encounter (Signed)
lmtcb for pt. Unsure why we are discussing a DMV form when it is stated that a patient assistance form for [employer] is needed..  Form has been completed and signed by MW.  A copy has been placed up front for pt to pick up, and the original has been placed in outgoing mail to patient.  We need to make patient aware of this when he calls back.

## 2019-05-14 NOTE — Telephone Encounter (Signed)
Patient called wants to come pick up form today - he would also like a copy to placed in the envelope for him - he can be reached at [phone] -pr

## 2019-05-14 NOTE — Telephone Encounter (Signed)
Sample given per Hinton Dyer

## 2019-05-14 NOTE — Telephone Encounter (Signed)
Pt called back - advised that form is ready for pick up - pt wants the original as well - I have taken it out of the outgoing mail and placed it with Tonya G. For patient wife to pick up -pr

## 2019-05-22 ENCOUNTER — Ambulatory Visit: Payer: Medicare Other | Admitting: Cardiovascular Disease

## 2019-05-26 DIAGNOSIS — J449 Chronic obstructive pulmonary disease, unspecified: Secondary | ICD-10-CM | POA: Diagnosis not present

## 2019-06-01 ENCOUNTER — Telehealth (INDEPENDENT_AMBULATORY_CARE_PROVIDER_SITE_OTHER): Payer: Medicare Other | Admitting: Cardiology

## 2019-06-01 ENCOUNTER — Other Ambulatory Visit: Payer: Self-pay

## 2019-06-01 ENCOUNTER — Telehealth: Payer: Self-pay

## 2019-06-01 DIAGNOSIS — I25118 Atherosclerotic heart disease of native coronary artery with other forms of angina pectoris: Secondary | ICD-10-CM

## 2019-06-01 DIAGNOSIS — J9611 Chronic respiratory failure with hypoxia: Secondary | ICD-10-CM | POA: Diagnosis not present

## 2019-06-01 MED ORDER — NITROGLYCERIN 0.4 MG SL SUBL: 0.4000 mg | SUBLINGUAL_TABLET | SUBLINGUAL | 99 refills | Status: DC | PRN

## 2019-06-01 NOTE — Progress Notes (Signed)
Virtual Visit via Telephone Note   This visit type was conducted due to national recommendations for restrictions regarding the COVID-19 Pandemic (e.g. social distancing) in an effort to limit this patient's exposure and mitigate transmission in our community.  Due to his co-morbid illnesses, this patient is at least at moderate risk for complications without adequate follow up.  This format is felt to be most appropriate for this patient at this time.  The patient did not have access to video technology/had technical difficulties with video requiring transitioning to audio format only (telephone).  All issues noted in this document were discussed and addressed.  No physical exam could be performed with this format.  Please refer to the patient's chart for his  consent to telehealth for [facility].   Date:  06/01/2019   ID:  [name], DOB 11-Jun-1950, MRN [id number]  Patient Location: Home Provider Location: Office  PCP:  Lucianne Lei, MD  Cardiologist:  No primary care provider on file.  Electrophysiologist:  None   Evaluation Performed:  New Patient Evaluation  Chief Complaint:  CAD  History of Present Illness:    [name] is a 69 y.o. male with CAD old MI with coronary angioplasty COPD hyperlipidemia new patient former of Dr. Thurman Coyer here to establish care  Office note from Dr. Wynonia Lawman reviewed.  Have been doing fairly well.  Oxygen dependent COPD.  Limited to walk with walker.  Chronic pain.  CAD with lateral wall MI 2002 in [place] treated with TNKase. February 2010 cardiac catheterization Dr. Tamala Julian in-stent restenosis of LAD stent BMS of LAD and obtuse marginal. In 2002 Cypher stent of obtuse marginal for in-stent restenosis and BMS and circumflex 2003 [facility].  [name] is his primary physician.  Used NTG 2 times last month, rare angina.  Denies any fevers chills nausea vomiting syncope.  The patient does not have symptoms  concerning for COVID-19 infection (fever, chills, cough, or new shortness of breath).    Past Medical History:  Diagnosis Date  . Anxiety   . Arthritis   . COPD (chronic obstructive pulmonary disease) ([place])   . Coronary artery disease    x4  . Depression   . Diabetes mellitus without complication ([place])   . GERD (gastroesophageal reflux disease)    tums will relieve  . HLD (hyperlipidemia)   . HTN (hypertension)   . Myocardial infarction ([place])    x2  . Obstructive sleep apnea    sleep study 2006, uses CPAP, pt does not know cpap settings  . On home oxygen therapy    2L via nasal cannula prn  . Shortness of breath    exertion   Past Surgical History:  Procedure Laterality Date  . CARDIAC CATHETERIZATION  2003,2010   x4   . COLONOSCOPY WITH PROPOFOL N/A 08/30/2014   Procedure: COLONOSCOPY WITH PROPOFOL;  Surgeon: Carol Ada, MD;  Location: [facility];  Service: Endoscopy;  Laterality: N/A;  h&p in file   . COLONOSCOPY WITH PROPOFOL N/A 07/08/2017   Procedure: COLONOSCOPY WITH PROPOFOL;  Surgeon: Carol Ada, MD;  Location: [facility];  Service: Endoscopy;  Laterality: N/A;  . CORONARY ANGIOPLASTY     stents x 4  . DENTAL SURGERY    . TOTAL HIP ARTHROPLASTY  04/06/2012   Procedure: TOTAL HIP ARTHROPLASTY;  Surgeon: Sharmon Revere, MD;  Location: [facility];  Service: Orthopedics;  Laterality: Right;  . TOTAL HIP ARTHROPLASTY Left 04/03/2013   Procedure: LEFT TOTAL HIP  ARTHROPLASTY;  Surgeon: Sharmon Revere, MD;  Location: [facility];  Service: Orthopedics;  Laterality: Left;     Current Meds  Medication Sig  . albuterol (PROAIR HFA) 108 (90 Base) MCG/ACT inhaler 2 puffs every 4 hours as needed only  if your can't catch your breath  . amLODipine (NORVASC) 5 MG tablet Take 5 mg by mouth at bedtime.   Marland Kitchen aspirin EC 81 MG tablet Take 81 mg by mouth at bedtime.  Marland Kitchen atenolol (TENORMIN) 25 MG tablet Take 25 mg by mouth at bedtime.   . busPIRone (BUSPAR) 5 MG tablet Take 1 tablet (5  mg total) by mouth 2 (two) times daily.  . clopidogrel (PLAVIX) 75 MG tablet Take 75 mg by mouth at bedtime.   . Coenzyme Q10 (COQ10) 100 MG CAPS Take 1 capsule by mouth at bedtime.   Marland Kitchen dextromethorphan-guaiFENesin (MUCINEX DM) 30-600 MG 12hr tablet Take 1 tablet by mouth 2 (two) times daily as needed for cough.   . metFORMIN (GLUCOPHAGE) 500 MG tablet Take 1,000 mg by mouth at bedtime. 2 tabs by mouth once daily at bedtime  . mirtazapine (REMERON) 45 MG tablet Take 1 tablet (45 mg total) by mouth at bedtime.  . mometasone-formoterol (DULERA) 200-5 MCG/ACT AERO Inhale 2 puffs into the lungs 2 (two) times daily.  . Multiple Vitamin (MULTIVITAMIN WITH MINERALS) TABS Take 1 tablet by mouth at bedtime.   . nitroGLYCERIN (NITROSTAT) 0.4 MG SL tablet Place 1 tablet (0.4 mg total) under the tongue every 5 (five) minutes as needed for chest pain.  Marland Kitchen omega-3 acid ethyl esters (LOVAZA) 1 g capsule Take by mouth.  . Omega-3 Fatty Acids (FISH OIL) 1000 MG CAPS Take 1 capsule by mouth daily.  . OXYGEN Inhale 2-3 L into the lungs as needed. With exertion only   . pravastatin (PRAVACHOL) 80 MG tablet Take 80 mg by mouth at bedtime.   . sildenafil (VIAGRA) 25 MG tablet Take 25 mg by mouth See admin instructions. Use as directed  . SPIRIVA RESPIMAT 2.5 MCG/ACT AERS USE 2 INHALATIONS BY MOUTH  IN THE MORNING  . testosterone cypionate (DEPOTESTOSTERONE CYPIONATE) 200 MG/ML injection Inject 200 mg into the muscle every 21 ( twenty-one) days.   . Tiotropium Bromide Monohydrate (SPIRIVA RESPIMAT) 2.5 MCG/ACT AERS Inhale 2 puffs into the lungs daily.  . vitamin B-12 (CYANOCOBALAMIN) 1000 MCG tablet Take 1,000 mcg by mouth daily.  . [DISCONTINUED] nitroGLYCERIN (NITROSTAT) 0.4 MG SL tablet Place 1 tablet (0.4 mg total) under the tongue every 5 (five) minutes as needed for chest pain.     Allergies:   Patient has no known allergies.   Social History   Tobacco Use  . Smoking status: Former Smoker    Packs/day: 0.25     Years: 45.00    Pack years: 11.25    Types: Cigarettes    Quit date: 11/24/2014    Years since quitting: 4.5  . Smokeless tobacco: Never Used  Substance Use Topics  . Alcohol use: No    Alcohol/week: 0.0 standard drinks    Comment: Reports drinks a beer maybe 4 times a year.  . Drug use: No     Family Hx: [name] family history includes Depression in his mother; Emphysema in his father; Heart disease in his father.  ROS:   Please see the history of present illness.    Denies any fevers chills nausea vomiting syncope bleeding All other systems reviewed and are negative.   Prior CV studies:  The following studies were reviewed today:  Prior cardiac catheterization, office notes Dr. Wynonia Lawman  Labs/Other Tests and Data Reviewed:    EKG: No new EKG  Recent Labs: No results found for requested labs within last 8760 hours.   Recent Lipid Panel Lab Results  Component Value Date/Time   CHOL  06/23/2008 04:50 AM    174        ATP III CLASSIFICATION:  <200     mg/dL   Desirable  200-239  mg/dL   Borderline High  >=240    mg/dL   High          TRIG 41 06/23/2008 04:50 AM   HDL 46 06/23/2008 04:50 AM   CHOLHDL 3.8 06/23/2008 04:50 AM   LDLCALC (H) 06/23/2008 04:50 AM    120        Total Cholesterol/HDL:CHD Risk Coronary Heart Disease Risk Table                     Men   Women  1/2 Average Risk   3.4   3.3  Average Risk       5.0   4.4  2 X Average Risk   9.6   7.1  3 X Average Risk  23.4   11.0        Use the calculated Patient Ratio above and the CHD Risk Table to determine the patient's CHD Risk.        ATP III CLASSIFICATION (LDL):  <100     mg/dL   Optimal  100-129  mg/dL   Near or Above                    Optimal  130-159  mg/dL   Borderline  160-189  mg/dL   High  >190     mg/dL   Very High    Wt Readings from Last 3 Encounters:  06/01/19 182 lb (82.6 kg)  08/02/17 204 lb (92.5 kg)  07/05/17 205 lb (93 kg)     Objective:    Vital Signs:   BP 127/81   Pulse 89   Ht 5\' 9"  (1.753 m)   Wt 182 lb (82.6 kg)   BMI 26.88 kg/m    VITAL SIGNS:  reviewed  Able to complete full sentences without difficulty.  Alert and oriented x3.  Pleasant.  ASSESSMENT & PLAN:    Coronary artery disease prior stents/angina -Has sporadic anginal symptoms, rarely uses nitroglycerin but we will go ahead and give him a refill.  If his anginal symptoms worsen or become more worrisome, he will let us know.  For now continue with medical management. -Continue with dual antiplatelet therapy lifelong as long as bleeding is not a risk given his early generation drug-eluting stent  Essential hypertension -Under excellent control medications reviewed, [name] has been monitoring.  Severe COPD -Oxygen dependent.  Being very careful during the pandemic.  Encouraged vaccine.  Hyperlipidemia -Continue current treatment strategy, medication as above.  No myalgias.  LDL goal less than 70.  [name] has been monitoring labs.  COVID-19 Education: The signs and symptoms of COVID-19 were discussed with the patient and how to seek care for testing (follow up with PCP or arrange E-visit).  The importance of social distancing was discussed today.  Time:   Today, I have spent 21 minutes with the patient with telehealth technology and review of prior medical records cardiac catheterization discussing the above problems.     Medication  Adjustments/Labs and Tests Ordered: Current medicines are reviewed at length with the patient today.  Concerns regarding medicines are outlined above.   Tests Ordered: No orders of the defined types were placed in this encounter.   Medication Changes: Meds ordered this encounter  Medications  . nitroGLYCERIN (NITROSTAT) 0.4 MG SL tablet    Sig: Place 1 tablet (0.4 mg total) under the tongue every 5 (five) minutes as needed for chest pain.    Dispense:  25 tablet    Refill:  prn    Follow Up:  In Person in 1 year(s)   Signed, Candee Furbish, MD  06/01/2019 3:49 PM    [place]

## 2019-06-01 NOTE — Patient Instructions (Addendum)
Medication Instructions:  The current medical regimen is effective;  continue present plan and medications.  You prescription for your SL Nitroglycerin has been sent into your locate pharmacy. [facility]) *If you need a refill on your cardiac medications before your next appointment, please call your pharmacy*  Follow-Up: At [facility], you and your health needs are our priority.  As part of our continuing mission to provide you with exceptional heart care, we have created designated Provider Care Teams.  These Care Teams include your primary Cardiologist (physician) and Advanced Practice Providers (APPs -  Physician Assistants and Nurse Practitioners) who all work together to provide you with the care you need, when you need it.  Your next appointment:   12 month(s)  The format for your next appointment:   In Person  Provider:   Candee Furbish, MD  Thank you for choosing [facility]!!

## 2019-06-01 NOTE — Telephone Encounter (Signed)

## 2019-06-08 ENCOUNTER — Telehealth: Payer: Self-pay | Admitting: Internal Medicine

## 2019-06-08 MED ORDER — TRELEGY ELLIPTA 100-62.5-25 MCG/INH IN AEPB: 1.0000 | INHALATION_SPRAY | Freq: Every day | RESPIRATORY_TRACT | 3 refills | Status: DC

## 2019-06-08 NOTE — Telephone Encounter (Signed)
I called patient and he is ok with using Trelegy and I went over directions and everything with him. I advised him to not take the other inhalers while using the Trelegy and he verbalized understanding. I have sent Trelegy to the pharmacy.

## 2019-06-08 NOTE — Telephone Encounter (Signed)
Pt is interested in switching from [place] to Trelegy daily.    MW please advise on recs.  Thanks!

## 2019-06-08 NOTE — Telephone Encounter (Signed)
If familiar with elipta device that's fine, otherwise ov to train

## 2019-06-11 ENCOUNTER — Telehealth: Payer: Self-pay | Admitting: Internal Medicine

## 2019-06-11 NOTE — Telephone Encounter (Signed)
If he rinses gargles and spits it out right after use there should not be enough to cause a problem but if dose have issues can chang back to what he was previously on

## 2019-06-11 NOTE — Telephone Encounter (Signed)
Spoke with patient.  He wants to know if being lactose intolerant , can he still take the trelegy inhaler?  MW please advise

## 2019-06-11 NOTE — Telephone Encounter (Signed)
Called the patient and made him aware of the response received. Patient voiced understanding. Nothing further needed at this time.

## 2019-06-21 ENCOUNTER — Telehealth: Payer: Self-pay | Admitting: Internal Medicine

## 2019-06-21 NOTE — Telephone Encounter (Signed)
Called and spoke with pt. Stated to pt to only take the Trelegy but stated to him to keep the [facility] and spiriva as he could take those meds if he ran out of the Trelegy and had not been able to get a refill yet for the Trelegy. Pt verbalized understanding. Nothing further needed.

## 2019-06-21 NOTE — Telephone Encounter (Signed)
Attempted to call pt but line went straight to VM. Left message for pt to return call. 

## 2019-06-21 NOTE — Telephone Encounter (Signed)
Pt. Returning phone call [phone]

## 2019-06-26 ENCOUNTER — Other Ambulatory Visit (HOSPITAL_COMMUNITY): Payer: Self-pay | Admitting: Psychiatry

## 2019-06-26 DIAGNOSIS — J449 Chronic obstructive pulmonary disease, unspecified: Secondary | ICD-10-CM | POA: Diagnosis not present

## 2019-06-26 DIAGNOSIS — F330 Major depressive disorder, recurrent, mild: Secondary | ICD-10-CM

## 2019-06-29 ENCOUNTER — Ambulatory Visit (INDEPENDENT_AMBULATORY_CARE_PROVIDER_SITE_OTHER): Payer: Medicare Other | Admitting: Psychiatry

## 2019-06-29 ENCOUNTER — Other Ambulatory Visit: Payer: Self-pay

## 2019-06-29 ENCOUNTER — Encounter (HOSPITAL_COMMUNITY): Payer: Self-pay | Admitting: Psychiatry

## 2019-06-29 DIAGNOSIS — F411 Generalized anxiety disorder: Secondary | ICD-10-CM | POA: Diagnosis not present

## 2019-06-29 DIAGNOSIS — F330 Major depressive disorder, recurrent, mild: Secondary | ICD-10-CM

## 2019-06-29 MED ORDER — MIRTAZAPINE 45 MG PO TABS: 45.0000 mg | ORAL_TABLET | Freq: Every day | ORAL | 0 refills | Status: DC

## 2019-06-29 MED ORDER — BUSPIRONE HCL 5 MG PO TABS: 5.0000 mg | ORAL_TABLET | Freq: Two times a day (BID) | ORAL | 0 refills | Status: DC

## 2019-06-29 NOTE — Progress Notes (Signed)
Virtual Visit via Telephone Note  I connected with [name] on 06/29/19 at 10:40 AM EST by telephone and verified that I am speaking with the correct person using two identifiers.   I discussed the limitations, risks, security and privacy concerns of performing an evaluation and management service by telephone and the availability of in person appointments. I also discussed with the patient that there may be a patient responsible charge related to this service. The patient expressed understanding and agreed to proceed.   History of Present Illness: Patient was evaluated through phone session.  He is doing very well on his medication.  Recently he has seen his physician and changed some of his medication and his breathing is much improved.  He had restarted little bit walking and his energy level is improved.  He denies any crying spells or any anxiety attacks.  His sleep is good.  Denies any paranoia, hallucination or any feeling of hopelessness.  He like to keep his medication BuSpar and Remeron.  He lives with his wife who is very supportive.  His appetite is okay.   Past Psychiatric History:Reviewed. H/Odepression since 1990. Onesuicidal attempt. TriedZoloft,Paxil,Prozac, Vistaril, trazodoneand Wellbutrin but stopped due to side effects. H/Ousing cocaine, THCand alcohol for 20 years. Claims to be sober since coming to this office.   Psychiatric Specialty Exam: Physical Exam  Review of Systems  There were no vitals taken for this visit.There is no height or weight on file to calculate BMI.  General Appearance: NA  Eye Contact:  NA  Speech:  Clear and Coherent and Slow  Volume:  Normal  Mood:  Euthymic  Affect:  NA  Thought Process:  Goal Directed  Orientation:  Full (Time, Place, and Person)  Thought Content:  WDL  Suicidal Thoughts:  No  Homicidal Thoughts:  No  Memory:  Immediate;   Good Recent;   Good Remote;   Poor  Judgement:  Intact  Insight:  Present   Psychomotor Activity:  NA  Concentration:  Concentration: Fair and Attention Span: Fair  Recall:  Good  Fund of Knowledge:  Fair  Language:  Good  Akathisia:  No  Handed:  Right  AIMS (if indicated):     Assets:  Communication Skills Desire for Improvement Housing Resilience Social Support  ADL's:  Intact  Cognition:  WNL  Sleep:   ok      Assessment and Plan: Major depressive disorder, recurrent.  Generalized anxiety disorder.  Patient is a stable on his current medication.  Encourage to walk according to his his strength to help him mobilize and increase his energy level.  Continue BuSpar 5 mg twice a day and Remeron 45 mg at bedtime.  Recommended to call us back if he has any question or any concern.  Follow-up in 3 months.  Follow Up Instructions:    I discussed the assessment and treatment plan with the patient. The patient was provided an opportunity to ask questions and all were answered. The patient agreed with the plan and demonstrated an understanding of the instructions.   The patient was advised to call back or seek an in-person evaluation if the symptoms worsen or if the condition fails to improve as anticipated.  I provided 20 minutes of non-face-to-face time during this encounter.   Kathlee Nations, MD

## 2019-07-08 ENCOUNTER — Ambulatory Visit: Payer: Medicare Other

## 2019-07-15 ENCOUNTER — Ambulatory Visit: Payer: Medicare Other

## 2019-07-17 ENCOUNTER — Telehealth: Payer: Self-pay | Admitting: Internal Medicine

## 2019-07-17 MED ORDER — TRELEGY ELLIPTA 100-62.5-25 MCG/INH IN AEPB: 1.0000 | INHALATION_SPRAY | Freq: Every day | RESPIRATORY_TRACT | 3 refills | Status: DC

## 2019-07-17 NOTE — Telephone Encounter (Signed)
Called and spoke with pt. Pt is requesting a 90-day supply of trelegy to be sent to [facility]. He stated that he has been on the trelegy about 1.5 months. Pt stated about 3-4 weeks ago he noticed that his skin had begun to peel. Pt wants to know if the peeling of skin could be associated with the trelegy.  Dr. Melvyn Novas, please advise on this.

## 2019-07-17 NOTE — Telephone Encounter (Signed)
Very unlikely > rec eval by PCP/ derm and ok to give 3 m rx

## 2019-07-17 NOTE — Telephone Encounter (Signed)
I have sent Rx for 90-day supply of trelegy to [facility] for pt. Called and spoke with pt letting him know the info stated by MW and pt verbalized understanding. Nothing further needed.

## 2019-07-24 DIAGNOSIS — J449 Chronic obstructive pulmonary disease, unspecified: Secondary | ICD-10-CM | POA: Diagnosis not present

## 2019-07-30 DIAGNOSIS — G4730 Sleep apnea, unspecified: Secondary | ICD-10-CM | POA: Diagnosis not present

## 2019-07-30 DIAGNOSIS — G4733 Obstructive sleep apnea (adult) (pediatric): Secondary | ICD-10-CM | POA: Diagnosis not present

## 2019-08-07 ENCOUNTER — Telehealth: Payer: Self-pay | Admitting: Primary Care

## 2019-08-07 NOTE — Telephone Encounter (Signed)
Spoke with the pt  He states that he did not do well on the trelegy  He had started to notice while on this the skin on his hands was peeling off and his breathing was more labored  He stopped med 8 days ago and started back on his spiriva and dulera  He is now breathing better and skin back to normal  He wants to make sure ok to continue on this current regimen of spiriva and dulera  Please advise thanks

## 2019-08-08 ENCOUNTER — Other Ambulatory Visit (HOSPITAL_COMMUNITY): Payer: Self-pay | Admitting: Psychiatry

## 2019-08-08 DIAGNOSIS — F330 Major depressive disorder, recurrent, mild: Secondary | ICD-10-CM

## 2019-08-08 DIAGNOSIS — F411 Generalized anxiety disorder: Secondary | ICD-10-CM

## 2019-08-08 MED ORDER — SPIRIVA RESPIMAT 2.5 MCG/ACT IN AERS: 2.0000 | INHALATION_SPRAY | Freq: Every day | RESPIRATORY_TRACT | 3 refills | Status: DC

## 2019-08-08 NOTE — Telephone Encounter (Signed)
Spoke with the pt and notified ok per Dr Melvyn Novas to go back on spiriva and dulera  Rx for spiriva sent to [facility] for 90 day supply with 3 refills

## 2019-08-08 NOTE — Telephone Encounter (Signed)
That's fine

## 2019-08-09 ENCOUNTER — Other Ambulatory Visit: Payer: Self-pay

## 2019-08-09 ENCOUNTER — Emergency Department (HOSPITAL_COMMUNITY)
Admission: EM | Admit: 2019-08-09 | Discharge: 2019-08-09 | Disposition: A | Discharge status: Home / Self Care | Payer: Medicare Other | Attending: Emergency Medicine | Admitting: Emergency Medicine

## 2019-08-09 ENCOUNTER — Emergency Department (HOSPITAL_COMMUNITY): Payer: Medicare Other

## 2019-08-09 ENCOUNTER — Telehealth (HOSPITAL_COMMUNITY): Payer: Self-pay

## 2019-08-09 ENCOUNTER — Encounter (HOSPITAL_COMMUNITY): Payer: Self-pay

## 2019-08-09 DIAGNOSIS — Z7901 Long term (current) use of anticoagulants: Secondary | ICD-10-CM | POA: Diagnosis not present

## 2019-08-09 DIAGNOSIS — J449 Chronic obstructive pulmonary disease, unspecified: Secondary | ICD-10-CM | POA: Insufficient documentation

## 2019-08-09 DIAGNOSIS — Z79899 Other long term (current) drug therapy: Secondary | ICD-10-CM | POA: Diagnosis not present

## 2019-08-09 DIAGNOSIS — Z87891 Personal history of nicotine dependence: Secondary | ICD-10-CM | POA: Diagnosis not present

## 2019-08-09 DIAGNOSIS — E119 Type 2 diabetes mellitus without complications: Secondary | ICD-10-CM | POA: Diagnosis not present

## 2019-08-09 DIAGNOSIS — Z20822 Contact with and (suspected) exposure to covid-19: Secondary | ICD-10-CM | POA: Diagnosis not present

## 2019-08-09 DIAGNOSIS — I252 Old myocardial infarction: Secondary | ICD-10-CM | POA: Diagnosis not present

## 2019-08-09 DIAGNOSIS — I2510 Atherosclerotic heart disease of native coronary artery without angina pectoris: Secondary | ICD-10-CM | POA: Diagnosis not present

## 2019-08-09 DIAGNOSIS — R0689 Other abnormalities of breathing: Secondary | ICD-10-CM | POA: Diagnosis not present

## 2019-08-09 DIAGNOSIS — I10 Essential (primary) hypertension: Secondary | ICD-10-CM | POA: Diagnosis not present

## 2019-08-09 DIAGNOSIS — R0789 Other chest pain: Secondary | ICD-10-CM | POA: Diagnosis not present

## 2019-08-09 DIAGNOSIS — R0602 Shortness of breath: Secondary | ICD-10-CM | POA: Diagnosis not present

## 2019-08-09 DIAGNOSIS — Z743 Need for continuous supervision: Secondary | ICD-10-CM | POA: Diagnosis not present

## 2019-08-09 DIAGNOSIS — Z7982 Long term (current) use of aspirin: Secondary | ICD-10-CM | POA: Diagnosis not present

## 2019-08-09 DIAGNOSIS — Z7984 Long term (current) use of oral hypoglycemic drugs: Secondary | ICD-10-CM | POA: Diagnosis not present

## 2019-08-09 DIAGNOSIS — J189 Pneumonia, unspecified organism: Secondary | ICD-10-CM | POA: Diagnosis not present

## 2019-08-09 DIAGNOSIS — R05 Cough: Secondary | ICD-10-CM | POA: Diagnosis not present

## 2019-08-09 DIAGNOSIS — R079 Chest pain, unspecified: Secondary | ICD-10-CM | POA: Diagnosis not present

## 2019-08-09 LAB — CBC WITH DIFFERENTIAL/PLATELET
Abs Immature Granulocytes: 0.02 10*3/uL (ref 0.00–0.07)
Basophils Absolute: 0.1 10*3/uL (ref 0.0–0.1)
Basophils Relative: 1 %
Eosinophils Absolute: 0.1 10*3/uL (ref 0.0–0.5)
Eosinophils Relative: 1 %
HCT: 47.9 % (ref 39.0–52.0)
Hemoglobin: 15.9 g/dL (ref 13.0–17.0)
Immature Granulocytes: 0 %
Lymphocytes Relative: 17 %
Lymphs Abs: 1.3 10*3/uL (ref 0.7–4.0)
MCH: 27.7 pg (ref 26.0–34.0)
MCHC: 33.2 g/dL (ref 30.0–36.0)
MCV: 83.3 fL (ref 80.0–100.0)
Monocytes Absolute: 0.6 10*3/uL (ref 0.1–1.0)
Monocytes Relative: 9 %
Neutro Abs: 5.3 10*3/uL (ref 1.7–7.7)
Neutrophils Relative %: 72 %
Platelets: 378 10*3/uL (ref 150–400)
RBC: 5.75 MIL/uL (ref 4.22–5.81)
RDW: 15.5 % (ref 11.5–15.5)
WBC: 7.3 10*3/uL (ref 4.0–10.5)
nRBC: 0 % (ref 0.0–0.2)

## 2019-08-09 LAB — COMPREHENSIVE METABOLIC PANEL
ALT: 20 U/L (ref 0–44)
AST: 28 U/L (ref 15–41)
Albumin: 4 g/dL (ref 3.5–5.0)
Alkaline Phosphatase: 45 U/L (ref 38–126)
Anion gap: 11 (ref 5–15)
BUN: 6 mg/dL — ABNORMAL LOW (ref 8–23)
CO2: 24 mmol/L (ref 22–32)
Calcium: 9.7 mg/dL (ref 8.9–10.3)
Chloride: 104 mmol/L (ref 98–111)
Creatinine, Ser: 1.02 mg/dL (ref 0.61–1.24)
GFR calc Af Amer: >60 mL/min (ref >60)
GFR calc non Af Amer: >60 mL/min (ref >60)
Glucose, Bld: 151 mg/dL — ABNORMAL HIGH (ref 70–99)
Potassium: 4.2 mmol/L (ref 3.5–5.1)
Sodium: 139 mmol/L (ref 135–145)
Total Bilirubin: 1.4 mg/dL — ABNORMAL HIGH (ref 0.3–1.2)
Total Protein: 7.9 g/dL (ref 6.5–8.1)

## 2019-08-09 LAB — TROPONIN I (HIGH SENSITIVITY)
Troponin I (High Sensitivity): 4 ng/L (ref <18)
Troponin I (High Sensitivity): 6 ng/L (ref <18)

## 2019-08-09 LAB — POC SARS CORONAVIRUS 2 AG -  ED: SARS Coronavirus 2 Ag: NEGATIVE

## 2019-08-09 MED ORDER — IOHEXOL 350 MG/ML SOLN
100.0000 mL | Freq: Once | INTRAVENOUS | Status: AC | PRN
  Administered 2019-08-09: 19:00:00 100 mL via INTRAVENOUS

## 2019-08-09 MED ORDER — DOXYCYCLINE HYCLATE 100 MG PO CAPS: 100.0000 mg | ORAL_CAPSULE | Freq: Two times a day (BID) | ORAL | 0 refills | Status: DC

## 2019-08-09 MED ORDER — BENZONATATE 100 MG PO CAPS: 100.0000 mg | ORAL_CAPSULE | Freq: Three times a day (TID) | ORAL | 0 refills | Status: DC

## 2019-08-09 NOTE — ED Triage Notes (Signed)
Pt bib ems, reporting that the pt with COPD is on 3L as a baseline. PT was walking to get his covid shot and felt more short of breath than normal. Oxygen tank stopped working and pt was satting at 76% on ra. hxt of stents. Pain on palpation for left side of his ribs. No recent falls. Pt aox4  142/96 120hr 98%10L 30rr

## 2019-08-09 NOTE — Telephone Encounter (Signed)
Patient called and stated that his Buspirone 5mg  is not working and he doesn't know what to do. He would like to know if he can increase the dosage. Please review and advise. Thank you.

## 2019-08-09 NOTE — Discharge Instructions (Addendum)
Please take your medications, as prescribed.   Follow-up with your primary care provider regarding today's encounter.  Return to the ED or seek immediate medical attention should you experience any new or worsening symptoms.

## 2019-08-09 NOTE — ED Provider Notes (Addendum)
[facility] EMERGENCY DEPARTMENT Provider Note   CSN: [id number] Arrival date & time: 08/09/19  1530     History Chief Complaint  Patient presents with  . Shortness of Breath    [name] is a 69 y.o. male with PMH significant for HTN, HLD, OSA, COPD, CAD, and MI who presents to the ED via EMS for chest pain and shortness of breath.  I obtained history from EMS who reports that he was 76% on RA which prompted them to place him on 10 L supplemental O2.  Patient reports that for the past two weeks he has had shortness of breath in conjunction with a cough productive of blood.  He states that on several occasions he has coughed up a large clot of blood in the nose not simply blood-tinged sputum.  He states that he has left-sided chest pain described as a "squeezing" that occurs with deep inspiration.  He also is endorsing some left anterior chest wall discomfort, worse with cough.  He reports that he was going to get his first COVID-19 vaccine when his portable oxygen tank malfunctioned and he realized he was not receiving his typical 3 to 4 L supplemental O2.  He became acutely lightheaded and called EMS.  He denies any fevers or chills, abdominal pain, nausea or vomiting, hematemesis, urinary symptoms, or changes in bowel habits.  Patient endorses a 50-pack-year smoking history.  HPI     Past Medical History:  Diagnosis Date  . Anxiety   . Arthritis   . COPD (chronic obstructive pulmonary disease) ([place])   . Coronary artery disease    x4  . Depression   . Diabetes mellitus without complication ([place])   . GERD (gastroesophageal reflux disease)    tums will relieve  . HLD (hyperlipidemia)   . HTN (hypertension)   . Myocardial infarction ([place])    x2  . Obstructive sleep apnea    sleep study 2006, uses CPAP, pt does not know cpap settings  . On home oxygen therapy    2L via nasal cannula prn  . Shortness of breath    exertion    Patient Active Problem List    Diagnosis Date Noted  . Elevated prostate specific antigen (PSA) 02/01/2018  . COPD exacerbation ([place]) 11/11/2015  . Smoker 05/02/2013  . COPD GOLD III with reversibility  05/01/2013  . Chronic respiratory failure with hypoxia ([place]) 05/01/2013  . Depression 06/30/2011    Past Surgical History:  Procedure Laterality Date  . CARDIAC CATHETERIZATION  2003,2010   x4   . COLONOSCOPY WITH PROPOFOL N/A 08/30/2014   Procedure: COLONOSCOPY WITH PROPOFOL;  Surgeon: Carol Ada, MD;  Location: [facility];  Service: Endoscopy;  Laterality: N/A;  h&p in file   . COLONOSCOPY WITH PROPOFOL N/A 07/08/2017   Procedure: COLONOSCOPY WITH PROPOFOL;  Surgeon: Carol Ada, MD;  Location: [facility];  Service: Endoscopy;  Laterality: N/A;  . CORONARY ANGIOPLASTY     stents x 4  . DENTAL SURGERY    . TOTAL HIP ARTHROPLASTY  04/06/2012   Procedure: TOTAL HIP ARTHROPLASTY;  Surgeon: Sharmon Revere, MD;  Location: [facility];  Service: Orthopedics;  Laterality: Right;  . TOTAL HIP ARTHROPLASTY Left 04/03/2013   Procedure: LEFT TOTAL HIP ARTHROPLASTY;  Surgeon: Sharmon Revere, MD;  Location: [facility];  Service: Orthopedics;  Laterality: Left;       Family History  Problem Relation Age of Onset  . Depression Mother   . Heart disease Father   .  Emphysema Father        smoked    Social History   Tobacco Use  . Smoking status: Former Smoker    Packs/day: 0.25    Years: 45.00    Pack years: 11.25    Types: Cigarettes    Quit date: 11/24/2014    Years since quitting: 4.7  . Smokeless tobacco: Never Used  Substance Use Topics  . Alcohol use: No    Alcohol/week: 0.0 standard drinks    Comment: Reports drinks a beer maybe 4 times a year.  . Drug use: No    Home Medications Prior to Admission medications   Medication Sig Start Date End Date Taking? Authorizing Provider  albuterol (PROAIR HFA) 108 (90 Base) MCG/ACT inhaler 2 puffs every 4 hours as needed only  if your can't catch your breath  06/24/16   Parrett, Tammy S, NP  amLODipine (NORVASC) 5 MG tablet Take 5 mg by mouth at bedtime.  02/26/11   [provider]  aspirin EC 81 MG tablet Take 81 mg by mouth at bedtime.    [provider]  atenolol (TENORMIN) 25 MG tablet Take 25 mg by mouth at bedtime.  01/13/11   [provider]  benzonatate (TESSALON) 100 MG capsule Take 1 capsule (100 mg total) by mouth every 8 (eight) hours. 08/09/19   Corena Herter, PA-C  busPIRone (BUSPAR) 5 MG tablet Take 1 tablet (5 mg total) by mouth 2 (two) times daily. 06/29/19 06/28/20  Arfeen, Arlyce Harman, MD  clopidogrel (PLAVIX) 75 MG tablet Take 75 mg by mouth at bedtime.     [provider]  Coenzyme Q10 (COQ10) 100 MG CAPS Take 1 capsule by mouth at bedtime.     [provider]  dextromethorphan-guaiFENesin (MUCINEX DM) 30-600 MG 12hr tablet Take 1 tablet by mouth 2 (two) times daily as needed for cough.     [provider]  doxycycline (VIBRAMYCIN) 100 MG capsule Take 1 capsule (100 mg total) by mouth 2 (two) times daily. 08/09/19   Corena Herter, PA-C  Fluticasone-Umeclidin-Vilant (TRELEGY ELLIPTA) 100-62.5-25 MCG/INH AEPB Inhale 1 puff into the lungs daily. 07/17/19   Tanda Rockers, MD  metFORMIN (GLUCOPHAGE) 500 MG tablet Take 1,000 mg by mouth at bedtime. 2 tabs by mouth once daily at bedtime 06/22/11   [provider]  mirtazapine (REMERON) 45 MG tablet Take 1 tablet (45 mg total) by mouth at bedtime. 06/29/19   Arfeen, Arlyce Harman, MD  Multiple Vitamin (MULTIVITAMIN WITH MINERALS) TABS Take 1 tablet by mouth at bedtime.     [provider]  nitroGLYCERIN (NITROSTAT) 0.4 MG SL tablet Place 1 tablet (0.4 mg total) under the tongue every 5 (five) minutes as needed for chest pain. 06/01/19   Jerline Pain, MD  omega-3 acid ethyl esters (LOVAZA) 1 g capsule Take by mouth.    [provider]  Omega-3 Fatty Acids (FISH OIL) 1000 MG CAPS Take 1 capsule by mouth daily.    [provider]  OXYGEN Inhale 2-3 L into the lungs as needed. With exertion only     [provider]  pravastatin (PRAVACHOL) 80 MG tablet Take 80 mg by mouth at bedtime.  09/18/15   [provider]  sildenafil (VIAGRA) 25 MG tablet Take 25 mg by mouth See admin instructions. Use as directed    [provider]  testosterone cypionate (DEPOTESTOSTERONE CYPIONATE) 200 MG/ML injection Inject 200 mg into the muscle every 21 ( twenty-one) days.  08/03/18   [provider]  Tiotropium Bromide Monohydrate (SPIRIVA RESPIMAT) 2.5 MCG/ACT AERS Inhale 2 puffs into the lungs daily. 08/08/19   Tanda Rockers, MD  vitamin B-12 (CYANOCOBALAMIN) 1000 MCG tablet Take 1,000 mcg by mouth daily.    [provider]    Allergies    Patient has no known allergies.  Review of Systems   Review of Systems  Constitutional: Negative for chills and fever.  Respiratory: Positive for cough and shortness of breath.   Cardiovascular: Positive for chest pain. Negative for palpitations.  Gastrointestinal: Negative for abdominal pain, nausea and vomiting.    Physical Exam Updated Vital Signs BP 119/76   Pulse 84   Temp 98.5 F (36.9 C) (Oral)   Resp 11   Ht 5\' 10"  (1.778 m)   Wt 81.6 kg   SpO2 95%   BMI 25.83 kg/m   Physical Exam Vitals and nursing note reviewed. Exam conducted with a chaperone present.  HENT:     Head: Normocephalic and atraumatic.  Eyes:     General: No scleral icterus.    Conjunctiva/sclera: Conjunctivae normal.  Cardiovascular:     Rate and Rhythm: Normal rate and regular rhythm.     Pulses: Normal pulses.  Abdominal:     General: Abdomen is flat.  Musculoskeletal:     Cervical back: Normal range of motion and neck supple. No rigidity.  Skin:    General: Skin is dry.     Capillary Refill: Capillary refill takes less than 2 seconds.  Neurological:     Mental Status: He is alert and oriented to person, place, and time.     GCS: GCS eye  subscore is 4. GCS verbal subscore is 5. GCS motor subscore is 6.  Psychiatric:        Mood and Affect: Mood normal.        Behavior: Behavior normal.        Thought Content: Thought content normal.      ED Results / Procedures / Treatments   Labs (all labs ordered are listed, but only abnormal results are displayed) Labs Reviewed  COMPREHENSIVE METABOLIC PANEL - Abnormal; Notable for the following components:      Result Value   Glucose, Bld 151 (*)    BUN 6 (*)    Total Bilirubin 1.4 (*)    All other components within normal limits  CBC WITH DIFFERENTIAL/PLATELET  POC SARS CORONAVIRUS 2 AG -  ED  TROPONIN I (HIGH SENSITIVITY)  TROPONIN I (HIGH SENSITIVITY)    EKG EKG Interpretation  Date/Time:  Thursday August 09 2019 15:36:39 EDT Ventricular Rate:  108 PR Interval:    QRS Duration: 81 QT Interval:  313 QTC Calculation: 418 R Axis:   96 Text Interpretation: Sinus tachycardia Multiple premature complexes, vent & supraven Probable left atrial enlargement Probable lateral infarct, age indeterminate Confirmed by Dene Gentry [id number]) on 08/09/2019 4:36:08 PM   Radiology CT Angio Chest PE W/Cm &/Or Wo Cm  Result Date: 08/09/2019 CLINICAL DATA:  Chest pain, shortness of breath and hemoptysis. EXAM: CT ANGIOGRAPHY CHEST WITH CONTRAST TECHNIQUE: Multidetector CT imaging of the chest was performed using the standard protocol during bolus administration of intravenous contrast. Multiplanar CT image reconstructions and MIPs were obtained to evaluate the vascular anatomy. CONTRAST:  179mL OMNIPAQUE IOHEXOL 350 MG/ML SOLN COMPARISON:  CT scan from 2010. FINDINGS: Cardiovascular: The heart is normal in size. No pericardial effusion. There is mild tortuosity and ectasia of the thoracic aorta but no  aneurysm or dissection. Moderate scattered atherosclerotic calcifications for age. Three-vessel coronary artery calcifications are noted along with coronary artery stents. Moderate reflux of  contrast down the IVC and into the hepatic veins is likely due to tricuspid regurgitation. The pulmonary arterial tree is very well opacified. No filling defects to suggest pulmonary embolism. Mediastinum/Nodes: Small scattered mediastinal lymph nodes and a borderline enlarged right hilar lymph node. The esophagus is grossly normal. The thyroid gland is unremarkable. Lungs/Pleura: Severe emphysematous changes and areas of pulmonary scarring. Patchy ill-defined airspace opacity in the right upper lobe peripherally is most likely an inflammatory or infectious process/pneumonia. There are also a few patchy ill-defined densities in the right upper lobe which could be inflammatory. 9.5 mm lesion in the right upper lobe on image 24/6 will need follow-up. No pulmonary edema or pleural effusions. No pleural nodules. Mild eventration right hemidiaphragm with some overlying vascular crowding and atelectasis. Upper Abdomen: No significant upper abdominal findings. Musculoskeletal: No chest wall mass, supraclavicular or axillary adenopathy. The bony thorax is intact. Review of the MIP images confirms the above findings. IMPRESSION: 1. No CT findings for pulmonary embolism. 2. Advanced emphysematous changes and areas of pulmonary scarring. 3. Patchy ill-defined airspace opacity in the right upper lobe is most likely an inflammatory or infectious process/pneumonia. 4. Nodular lesions in the right upper lobe possibly part of the same inflammatory/infectious process but could not exclude pulmonary nodules. Recommend follow-up noncontrast chest CT in 3-6 months. 5. Moderate reflux of contrast down the IVC and into the hepatic veins likely due to tricuspid regurgitation. Aortic Atherosclerosis (ICD10-I70.0) and Emphysema (ICD10-J43.9). Aortic Atherosclerosis (ICD10-I70.0) and Emphysema (ICD10-J43.9). Electronically Signed   By: Marijo Sanes M.D.   On: 08/09/2019 19:20   DG Chest Portable 1 View  Result Date: 08/09/2019 CLINICAL  DATA:  Shortness of breath.  Cough.  Chest pain. EXAM: PORTABLE CHEST 1 VIEW COMPARISON:  12/29/2016.  05/28/2015. FINDINGS: Mediastinum hilar structures normal. Mild bibasilar atelectasis/infiltrates. Chronic interstitial changes again noted. No pleural effusion or pneumothorax. Heart size stable. Coronary stent noted. IMPRESSION: 1.  Low lung volumes with mild bibasilar atelectasis/infiltrates. 2.  Chronic interstitial changes. 3.  Cardiac stent noted.  Heart size stable. Electronically Signed   By: Marcello Moores  Register   On: 08/09/2019 16:23    Procedures Procedures (including critical care time)  Medications Ordered in ED Medications  iohexol (OMNIPAQUE) 350 MG/ML injection 100 mL (100 mLs Intravenous Contrast Given 08/09/19 1851)    ED Course  I have reviewed the triage vital signs and the nursing notes.  Pertinent labs & imaging results that were available during my care of the patient were reviewed by me and considered in my medical decision making (see chart for details).    MDM Rules/Calculators/A&P                      Patient's history of hemoptysis in pleuritic chest pain was initially concerning for pulmonary embolism.  Obtained CTA which demonstrates patchy ill-defined airspace opacity in right upper lobe concerning for pneumonia versus inflammatory process.  Recommending follow-up noncontrast chest CT in 3 to 6 months to ensure resolution.  COVID-19 antigen testing was negative.  Remainder of lab work was reassuring.  Initial troponin was 4 and is not endorsing any current chest pain at this time.  His chest discomfort was reproducible with cough, palpation, and deep inspiration.  Negative for PE.  On reevaluation, patient is reassured by CT findings and would like to go home if possible.  He states that his wife has figured out his portable oxygen device and that is not working appropriately.  His vital signs are stable and the remainder of his work-up is reassuring.  He is 97% on 3  L O2 here in the ED now.  We will prescribe patient doxycycline for his suspected community-acquired pneumonia.  We will also prescribe Tessalon Perles antitussive relief.  Tylenol ibuprofen as needed for fevers or chills.  Patient will need to follow-up with his primary care provider regarding today's encounter.  Discussed case with Dr. Francia Greaves who agrees with plan.   Strict ED return precautions discussed.  All of the evaluation and work-up results were discussed with the patient and any family at bedside. They were provided opportunity to ask any additional questions and have none at this time. They have expressed understanding of verbal discharge instructions as well as return precautions and are agreeable to the plan.    The patient was counseled on the dangers of tobacco use, and was advised to quit.  Reviewed strategies to maximize success, including removing cigarettes and smoking materials from environment, stress management, substitution of other forms of reinforcement, support of family/friends and written materials. Total time was 5 min CPT code 99406.    Final Clinical Impression(s) / ED Diagnoses Final diagnoses:  Community acquired pneumonia of right upper lobe of lung    Rx / DC Orders ED Discharge Orders         Ordered    benzonatate (TESSALON) 100 MG capsule  Every 8 hours     08/09/19 2050    doxycycline (VIBRAMYCIN) 100 MG capsule  2 times daily     08/09/19 2050           Corena Herter, PA-C 08/09/19 2052    Corena Herter, PA-C 08/09/19 2056    Valarie Merino, MD 08/09/19 [phone]

## 2019-08-10 NOTE — Telephone Encounter (Signed)
He can take 3 times a day. Please call his pharmacy.

## 2019-08-24 ENCOUNTER — Other Ambulatory Visit: Payer: Self-pay

## 2019-08-28 ENCOUNTER — Other Ambulatory Visit (HOSPITAL_COMMUNITY): Payer: Self-pay

## 2019-08-28 MED ORDER — BUSPIRONE HCL 5 MG PO TABS: 5.0000 mg | ORAL_TABLET | Freq: Three times a day (TID) | ORAL | 0 refills | Status: DC

## 2019-08-30 ENCOUNTER — Telehealth (HOSPITAL_COMMUNITY): Payer: Self-pay

## 2019-08-30 NOTE — Telephone Encounter (Signed)
Pt called and left message stating he was having side effects since increasing his Buspar. Pt was called back and states he reduced himself back to BID buspar yesterday due to uncomfortable side effects including dizziness, light headed, eye pressure, blurred vision, and feeling "woozy". He began taking TID Buspar on Friday, per pt. Pt states BID Buspar did not feel like it was helping anymore and states in the past he took Paxil. Pt asking if this is something he could try again.  Please advise.

## 2019-08-30 NOTE — Telephone Encounter (Signed)
Pt was called and informed of doctors instructions to take Buspar twice a day. Pt verbalized understanding.

## 2019-08-30 NOTE — Telephone Encounter (Signed)
Lets try Buspar two a day for now.

## 2019-08-30 NOTE — Telephone Encounter (Signed)
Done

## 2019-09-04 ENCOUNTER — Telehealth: Payer: Self-pay | Admitting: Internal Medicine

## 2019-09-04 MED ORDER — BUDESONIDE-FORMOTEROL FUMARATE 160-4.5 MCG/ACT IN AERO: 2.0000 | INHALATION_SPRAY | Freq: Two times a day (BID) | RESPIRATORY_TRACT | 1 refills | Status: DC

## 2019-09-04 NOTE — Telephone Encounter (Signed)
Checked pt's chart and saw that he went to [facility] ED 3/25 and they did a cxr as well as CTA.  Called and spoke with pt to check to see how he has been since ED visit and pt stated he still has a lot of drainage that he is coughing up which he states is clear to yellow in color. Pt stated his wife will not let him to go out of the house as he has not had his covid vaccines yet.  While speaking to pt, pt stated he wants to switch from [facility] back to Symbicort. Dr. Melvyn Novas, please advise if you are okay with pt switching back to Symbicort.

## 2019-09-04 NOTE — Telephone Encounter (Signed)
Spoke with patient. He is aware that [facility] has approved the Symbicort 160. Will go ahead and send this into [facility] for him.   Nothing further needed at time of call.

## 2019-09-04 NOTE — Telephone Encounter (Signed)
Fine with me symb 160 2bid

## 2019-09-07 ENCOUNTER — Other Ambulatory Visit: Payer: Self-pay

## 2019-09-07 MED ORDER — NITROGLYCERIN 0.4 MG SL SUBL: 0.4000 mg | SUBLINGUAL_TABLET | SUBLINGUAL | 99 refills | Status: DC | PRN

## 2019-09-11 ENCOUNTER — Telehealth: Payer: Self-pay | Admitting: Internal Medicine

## 2019-09-11 NOTE — Telephone Encounter (Signed)
Noted Will keep an eye out and hold in my basket

## 2019-09-11 NOTE — Telephone Encounter (Signed)
Will be on lookout, nothing on my desk yet.

## 2019-09-11 NOTE — Telephone Encounter (Signed)
[name] please keep an eye out for paperwork about this patient:  Pt wants to make [place] aware [employer] has approved pt for cymbacourt for the rest of the year. We should be receiving a fax-- please update pt when received

## 2019-09-14 DIAGNOSIS — I10 Essential (primary) hypertension: Secondary | ICD-10-CM | POA: Diagnosis not present

## 2019-09-14 DIAGNOSIS — E119 Type 2 diabetes mellitus without complications: Secondary | ICD-10-CM | POA: Diagnosis not present

## 2019-09-14 DIAGNOSIS — J449 Chronic obstructive pulmonary disease, unspecified: Secondary | ICD-10-CM | POA: Diagnosis not present

## 2019-09-14 DIAGNOSIS — Z720 Tobacco use: Secondary | ICD-10-CM | POA: Diagnosis not present

## 2019-09-19 ENCOUNTER — Telehealth: Payer: Self-pay | Admitting: Physician Assistant

## 2019-09-19 NOTE — Telephone Encounter (Signed)
Still have not received anything on this pt

## 2019-09-19 NOTE — Telephone Encounter (Addendum)
I connected by phone with [name] and/or patient's caregiver on 09/19/2019 at 4:13 PM to discuss the potential vaccination through our Homebound vaccination initiative.   Prevaccination Checklist for COVID-19 Vaccines  1.  Are you feeling sick today? no  2.  Have you ever received a dose of a COVID-19 vaccine?  no      If yes, which one? None   3.  Have you ever had an allergic reaction: (This would include a severe reaction [ e.g., anaphylaxis] that required treatment with epinephrine or EpiPen or that caused you to go to the hospital.  It would also include an allergic reaction that occurred within 4 hours that caused hives, swelling, or respiratory distress, including wheezing.) A.  A previous dose of COVID-19 vaccine. no  B.  A vaccine or injectable therapy that contains multiple components, one of which is a COVID-19 vaccine component, but it is not known which component elicited the immediate reaction. no  C.  Are you allergic to polyethylene glycol? no   4.  Have you ever had an allergic reaction to another vaccine (other than COVID-19 vaccine) or an injectable medication? (This would include a severe reaction [ e.g., anaphylaxis] that required treatment with epinephrine or EpiPen or that caused you to go to the hospital.  It would also include an allergic reaction that occurred within 4 hours that caused hives, swelling, or respiratory distress, including wheezing.)  no   5.  Have you ever had a severe allergic reaction (e.g., anaphylaxis) to something other than a component of the COVID-19 vaccine, or any vaccine or injectable medication?  This would include food, pet, venom, environmental, or oral medication allergies.  no   6.  Have you received any vaccine in the last 14 days? no   7.  Have you ever had a positive test for COVID-19 or has a doctor ever told you that you had COVID-19?  no   8.  Have you received passive antibody therapy (monoclonal antibodies or convalescent  serum) as a treatment for COVID-19? no   9.  Do you have a weakened immune system caused by something such as HIV infection or cancer or do you take immunosuppressive drugs or therapies?  no   10.  Do you have a bleeding disorder or are you taking a blood thinner? no   11.  Are you pregnant or breast-feeding? no   12.  Do you have dermal fillers? no   __________________   This patient is a 69 y.o. male that meets the FDA criteria to receive homebound vaccination. Patient or parent/caregiver understands they have the option to accept or refuse homebound vaccination.  Patient passed the pre-screening checklist and would like to proceed with homebound vaccination.  Based on questionnaire above, I recommend the patient be observed for 30 minutes.  There are an estimated # 0 of other household members/caregivers (wife already got both Pfizer vaccine) who are also interested in receiving the vaccine.    I will send the patient's information to our scheduling team who will reach out to schedule the patient and potential caregiver/family members for homebound vaccination.    Leanor Kail 09/19/2019 4:13 PM

## 2019-09-25 ENCOUNTER — Telehealth (INDEPENDENT_AMBULATORY_CARE_PROVIDER_SITE_OTHER): Payer: Medicare Other | Admitting: Psychiatry

## 2019-09-25 ENCOUNTER — Other Ambulatory Visit: Payer: Self-pay

## 2019-09-25 ENCOUNTER — Encounter (HOSPITAL_COMMUNITY): Payer: Self-pay | Admitting: Psychiatry

## 2019-09-25 DIAGNOSIS — F411 Generalized anxiety disorder: Secondary | ICD-10-CM

## 2019-09-25 DIAGNOSIS — F330 Major depressive disorder, recurrent, mild: Secondary | ICD-10-CM

## 2019-09-25 MED ORDER — MIRTAZAPINE 45 MG PO TABS: 45.0000 mg | ORAL_TABLET | Freq: Every day | ORAL | 0 refills | Status: DC

## 2019-09-25 NOTE — Progress Notes (Signed)
Virtual Visit via Telephone Note  I connected with [name] on 09/25/19 at 11:20 AM EDT by telephone and verified that I am speaking with the correct person using two identifiers.   I discussed the limitations, risks, security and privacy concerns of performing an evaluation and management service by telephone and the availability of in person appointments. I also discussed with the patient that there may be a patient responsible charge related to this service. The patient expressed understanding and agreed to proceed.   History of Present Illness: Patient is evaluated by phone session.  He tried taking BuSpar 3 times a day but it made him very tired, dizzy and now he is taking twice a day.  He feels it is working better.  He denies any major anxiety attack.  He has health issues but overall things are going well.  He had pneumonia back in March but since then his breathing is better.  He is scheduled to have COVID vaccine in 3 weeks.  His appetite is okay.  His energy level is good.  He sleeps 7 to 8 hours without any problem.  He is compliant with mirtazapine.  He lives with his wife who is very supportive.  He has no tremors and shakes.  He like to keep his current medication.  Past Psychiatric History:Reviewed. H/Odepression since 1990. H/O suicidal attempt. TriedZoloft,Paxil,Prozac, Vistaril, trazodoneand Wellbutrin but stopped due to side effects. H/Ousing cocaine, THCand alcohol for 20 years. Claims to be sober since coming to this office.   Psychiatric Specialty Exam: Physical Exam  Review of Systems  There were no vitals taken for this visit.There is no height or weight on file to calculate BMI.  General Appearance: NA  Eye Contact:  NA  Speech:  Slow  Volume:  Normal  Mood:  Euthymic  Affect:  NA  Thought Process:  Goal Directed  Orientation:  Full (Time, Place, and Person)  Thought Content:  WDL  Suicidal Thoughts:  No  Homicidal Thoughts:  No  Memory:   Immediate;   Good Recent;   Good Remote;   Fair  Judgement:  Intact  Insight:  Present  Psychomotor Activity:  NA  Concentration:  Concentration: Fair and Attention Span: Fair  Recall:  [employer] of Knowledge:  Fair  Language:  Good  Akathisia:  No  Handed:  Right  AIMS (if indicated):     Assets:  Communication Skills Desire for Improvement Housing Resilience Social Support  ADL's:  Intact  Cognition:  WNL  Sleep:   ok      Assessment and Plan: Major depressive disorder, recurrent.  Generalized anxiety disorder.  Patient is a stable on his current psychotropic medication.  Continue BuSpar 5 mg twice a day and Remeron 45 mg at bedtime.  Discussed medication side effects and benefits.  Recommended to call us back if there is any question or any concerns.  Follow-up in 3 months.  Follow Up Instructions:    I discussed the assessment and treatment plan with the patient. The patient was provided an opportunity to ask questions and all were answered. The patient agreed with the plan and demonstrated an understanding of the instructions.   The patient was advised to call back or seek an in-person evaluation if the symptoms worsen or if the condition fails to improve as anticipated.  I provided 20 minutes of non-face-to-face time during this encounter.   Kathlee Nations, MD

## 2019-09-26 ENCOUNTER — Ambulatory Visit (HOSPITAL_COMMUNITY): Payer: Medicare Other | Admitting: Psychiatry

## 2019-09-27 ENCOUNTER — Telehealth: Payer: Self-pay | Admitting: Internal Medicine

## 2019-09-27 NOTE — Telephone Encounter (Signed)
ATC patient went straight to voicemail letting him know that we have not received any paperwork as of yet. Left fax number incase.

## 2019-09-27 NOTE — Telephone Encounter (Signed)
atc pt X2, line rang fast busy signal. We have not received anything from [facility] for this pt per documentation from earlier today in 09/11/19 phone note.    Wcb.

## 2019-09-28 NOTE — Telephone Encounter (Signed)
Called and spoke with patient. He states that he let them know yesterday that we have not received any paperwork as of yet and provided our fax number. We will keep an eye out for paperwork.

## 2019-10-01 NOTE — Telephone Encounter (Signed)
Calling to check on that the fax that was sent. Pt stated that MW can call and they can do a verbal order over the phone for the prescription. [phone]

## 2019-10-01 NOTE — Telephone Encounter (Signed)
I have not seen anything in MW box regarding paperwork.   Will route to [place] to see if she has anything with her

## 2019-10-02 DIAGNOSIS — R262 Difficulty in walking, not elsewhere classified: Secondary | ICD-10-CM | POA: Diagnosis not present

## 2019-10-02 DIAGNOSIS — J449 Chronic obstructive pulmonary disease, unspecified: Secondary | ICD-10-CM | POA: Diagnosis not present

## 2019-10-02 MED ORDER — BUDESONIDE-FORMOTEROL FUMARATE 160-4.5 MCG/ACT IN AERO: 2.0000 | INHALATION_SPRAY | Freq: Two times a day (BID) | RESPIRATORY_TRACT | 3 refills | Status: DC

## 2019-10-02 NOTE — Telephone Encounter (Signed)
I never have received anything  The number pt gave to call was a fax number  Are we supposed to fax rx?   Spoke with the pt  He states that Rx should be faxed to the number provided  This was done and nothing further needed

## 2019-10-31 DIAGNOSIS — G4733 Obstructive sleep apnea (adult) (pediatric): Secondary | ICD-10-CM | POA: Diagnosis not present

## 2019-10-31 DIAGNOSIS — G4730 Sleep apnea, unspecified: Secondary | ICD-10-CM | POA: Diagnosis not present

## 2019-11-06 DIAGNOSIS — R4020 Unspecified coma: Secondary | ICD-10-CM | POA: Diagnosis not present

## 2019-11-06 DIAGNOSIS — R300 Dysuria: Secondary | ICD-10-CM | POA: Diagnosis not present

## 2019-11-06 DIAGNOSIS — J80 Acute respiratory distress syndrome: Secondary | ICD-10-CM | POA: Diagnosis not present

## 2019-11-06 DIAGNOSIS — R000 Tachycardia, unspecified: Secondary | ICD-10-CM | POA: Diagnosis not present

## 2019-11-06 DIAGNOSIS — R0602 Shortness of breath: Secondary | ICD-10-CM | POA: Diagnosis not present

## 2019-11-07 ENCOUNTER — Inpatient Hospital Stay (HOSPITAL_COMMUNITY)
Admission: EM | Admit: 2019-11-07 | Discharge: 2019-11-15 | DRG: 193 | Disposition: A | Discharge status: Skilled Nursing Facility | Payer: Medicare Other | Attending: Internal Medicine | Admitting: Internal Medicine

## 2019-11-07 ENCOUNTER — Emergency Department (HOSPITAL_COMMUNITY): Payer: Medicare Other

## 2019-11-07 ENCOUNTER — Encounter (HOSPITAL_COMMUNITY): Payer: Self-pay | Admitting: Emergency Medicine

## 2019-11-07 DIAGNOSIS — R0603 Acute respiratory distress: Secondary | ICD-10-CM | POA: Diagnosis not present

## 2019-11-07 DIAGNOSIS — M2550 Pain in unspecified joint: Secondary | ICD-10-CM | POA: Diagnosis not present

## 2019-11-07 DIAGNOSIS — Z7951 Long term (current) use of inhaled steroids: Secondary | ICD-10-CM

## 2019-11-07 DIAGNOSIS — F329 Major depressive disorder, single episode, unspecified: Secondary | ICD-10-CM | POA: Diagnosis present

## 2019-11-07 DIAGNOSIS — I10 Essential (primary) hypertension: Secondary | ICD-10-CM | POA: Diagnosis present

## 2019-11-07 DIAGNOSIS — I2510 Atherosclerotic heart disease of native coronary artery without angina pectoris: Secondary | ICD-10-CM | POA: Diagnosis present

## 2019-11-07 DIAGNOSIS — Z96643 Presence of artificial hip joint, bilateral: Secondary | ICD-10-CM | POA: Diagnosis present

## 2019-11-07 DIAGNOSIS — N390 Urinary tract infection, site not specified: Secondary | ICD-10-CM | POA: Diagnosis present

## 2019-11-07 DIAGNOSIS — Z818 Family history of other mental and behavioral disorders: Secondary | ICD-10-CM | POA: Diagnosis not present

## 2019-11-07 DIAGNOSIS — B079 Viral wart, unspecified: Secondary | ICD-10-CM | POA: Diagnosis not present

## 2019-11-07 DIAGNOSIS — R5381 Other malaise: Secondary | ICD-10-CM | POA: Diagnosis not present

## 2019-11-07 DIAGNOSIS — Z9861 Coronary angioplasty status: Secondary | ICD-10-CM

## 2019-11-07 DIAGNOSIS — R140 Abdominal distension (gaseous): Secondary | ICD-10-CM | POA: Diagnosis not present

## 2019-11-07 DIAGNOSIS — K449 Diaphragmatic hernia without obstruction or gangrene: Secondary | ICD-10-CM | POA: Diagnosis present

## 2019-11-07 DIAGNOSIS — J441 Chronic obstructive pulmonary disease with (acute) exacerbation: Secondary | ICD-10-CM | POA: Diagnosis not present

## 2019-11-07 DIAGNOSIS — E119 Type 2 diabetes mellitus without complications: Secondary | ICD-10-CM | POA: Diagnosis present

## 2019-11-07 DIAGNOSIS — Z9981 Dependence on supplemental oxygen: Secondary | ICD-10-CM | POA: Diagnosis not present

## 2019-11-07 DIAGNOSIS — Z825 Family history of asthma and other chronic lower respiratory diseases: Secondary | ICD-10-CM

## 2019-11-07 DIAGNOSIS — J449 Chronic obstructive pulmonary disease, unspecified: Secondary | ICD-10-CM | POA: Diagnosis not present

## 2019-11-07 DIAGNOSIS — G4733 Obstructive sleep apnea (adult) (pediatric): Secondary | ICD-10-CM | POA: Diagnosis present

## 2019-11-07 DIAGNOSIS — J9621 Acute and chronic respiratory failure with hypoxia: Secondary | ICD-10-CM | POA: Diagnosis present

## 2019-11-07 DIAGNOSIS — Z20822 Contact with and (suspected) exposure to covid-19: Secondary | ICD-10-CM | POA: Diagnosis present

## 2019-11-07 DIAGNOSIS — R6889 Other general symptoms and signs: Secondary | ICD-10-CM | POA: Diagnosis not present

## 2019-11-07 DIAGNOSIS — E785 Hyperlipidemia, unspecified: Secondary | ICD-10-CM | POA: Diagnosis present

## 2019-11-07 DIAGNOSIS — R000 Tachycardia, unspecified: Secondary | ICD-10-CM | POA: Diagnosis present

## 2019-11-07 DIAGNOSIS — Z7984 Long term (current) use of oral hypoglycemic drugs: Secondary | ICD-10-CM

## 2019-11-07 DIAGNOSIS — R0602 Shortness of breath: Secondary | ICD-10-CM | POA: Diagnosis not present

## 2019-11-07 DIAGNOSIS — J440 Chronic obstructive pulmonary disease with acute lower respiratory infection: Secondary | ICD-10-CM | POA: Diagnosis present

## 2019-11-07 DIAGNOSIS — Z8249 Family history of ischemic heart disease and other diseases of the circulatory system: Secondary | ICD-10-CM

## 2019-11-07 DIAGNOSIS — J189 Pneumonia, unspecified organism: Principal | ICD-10-CM | POA: Diagnosis present

## 2019-11-07 DIAGNOSIS — F17210 Nicotine dependence, cigarettes, uncomplicated: Secondary | ICD-10-CM | POA: Diagnosis not present

## 2019-11-07 DIAGNOSIS — R1312 Dysphagia, oropharyngeal phase: Secondary | ICD-10-CM | POA: Diagnosis not present

## 2019-11-07 DIAGNOSIS — E1151 Type 2 diabetes mellitus with diabetic peripheral angiopathy without gangrene: Secondary | ICD-10-CM

## 2019-11-07 DIAGNOSIS — K2100 Gastro-esophageal reflux disease with esophagitis, without bleeding: Secondary | ICD-10-CM | POA: Diagnosis not present

## 2019-11-07 DIAGNOSIS — K2970 Gastritis, unspecified, without bleeding: Secondary | ICD-10-CM | POA: Diagnosis present

## 2019-11-07 DIAGNOSIS — R0902 Hypoxemia: Secondary | ICD-10-CM

## 2019-11-07 DIAGNOSIS — K208 Other esophagitis without bleeding: Secondary | ICD-10-CM | POA: Diagnosis not present

## 2019-11-07 DIAGNOSIS — R12 Heartburn: Secondary | ICD-10-CM | POA: Diagnosis not present

## 2019-11-07 DIAGNOSIS — K219 Gastro-esophageal reflux disease without esophagitis: Secondary | ICD-10-CM | POA: Diagnosis present

## 2019-11-07 DIAGNOSIS — B078 Other viral warts: Secondary | ICD-10-CM

## 2019-11-07 DIAGNOSIS — K311 Adult hypertrophic pyloric stenosis: Secondary | ICD-10-CM | POA: Diagnosis present

## 2019-11-07 DIAGNOSIS — Z8719 Personal history of other diseases of the digestive system: Secondary | ICD-10-CM

## 2019-11-07 DIAGNOSIS — M6281 Muscle weakness (generalized): Secondary | ICD-10-CM | POA: Diagnosis not present

## 2019-11-07 DIAGNOSIS — K3189 Other diseases of stomach and duodenum: Secondary | ICD-10-CM | POA: Diagnosis present

## 2019-11-07 DIAGNOSIS — Z743 Need for continuous supervision: Secondary | ICD-10-CM | POA: Diagnosis not present

## 2019-11-07 DIAGNOSIS — A630 Anogenital (venereal) warts: Secondary | ICD-10-CM | POA: Diagnosis present

## 2019-11-07 DIAGNOSIS — R933 Abnormal findings on diagnostic imaging of other parts of digestive tract: Secondary | ICD-10-CM | POA: Diagnosis not present

## 2019-11-07 DIAGNOSIS — Z7982 Long term (current) use of aspirin: Secondary | ICD-10-CM

## 2019-11-07 DIAGNOSIS — F419 Anxiety disorder, unspecified: Secondary | ICD-10-CM | POA: Diagnosis present

## 2019-11-07 DIAGNOSIS — Z7401 Bed confinement status: Secondary | ICD-10-CM | POA: Diagnosis not present

## 2019-11-07 DIAGNOSIS — Z79899 Other long term (current) drug therapy: Secondary | ICD-10-CM

## 2019-11-07 DIAGNOSIS — K2960 Other gastritis without bleeding: Secondary | ICD-10-CM | POA: Diagnosis not present

## 2019-11-07 LAB — BASIC METABOLIC PANEL
Anion gap: 13 (ref 5–15)
BUN: 17 mg/dL (ref 8–23)
CO2: 26 mmol/L (ref 22–32)
Calcium: 10.4 mg/dL — ABNORMAL HIGH (ref 8.9–10.3)
Chloride: 106 mmol/L (ref 98–111)
Creatinine, Ser: 1 mg/dL (ref 0.61–1.24)
GFR calc Af Amer: >60 mL/min (ref >60)
GFR calc non Af Amer: >60 mL/min (ref >60)
Glucose, Bld: 153 mg/dL — ABNORMAL HIGH (ref 70–99)
Potassium: 4.3 mmol/L (ref 3.5–5.1)
Sodium: 145 mmol/L (ref 135–145)

## 2019-11-07 LAB — URINALYSIS, ROUTINE W REFLEX MICROSCOPIC
Bilirubin Urine: NEGATIVE
Glucose, UA: NEGATIVE mg/dL
Hgb urine dipstick: NEGATIVE
Ketones, ur: NEGATIVE mg/dL
Nitrite: NEGATIVE
Protein, ur: 30 mg/dL — AB
Specific Gravity, Urine: 1.02 (ref 1.005–1.030)
pH: 5 (ref 5.0–8.0)

## 2019-11-07 LAB — CBC
HCT: 49.3 % (ref 39.0–52.0)
Hemoglobin: 16.7 g/dL (ref 13.0–17.0)
MCH: 27.7 pg (ref 26.0–34.0)
MCHC: 33.9 g/dL (ref 30.0–36.0)
MCV: 81.8 fL (ref 80.0–100.0)
Platelets: 284 10*3/uL (ref 150–400)
RBC: 6.03 MIL/uL — ABNORMAL HIGH (ref 4.22–5.81)
RDW: 16.2 % — ABNORMAL HIGH (ref 11.5–15.5)
WBC: 27.2 10*3/uL — ABNORMAL HIGH (ref 4.0–10.5)
nRBC: 0 % (ref 0.0–0.2)

## 2019-11-07 LAB — SARS CORONAVIRUS 2 BY RT PCR (HOSPITAL ORDER, PERFORMED IN ~~LOC~~ HOSPITAL LAB): SARS Coronavirus 2: NEGATIVE

## 2019-11-07 LAB — I-STAT VENOUS BLOOD GAS, ED
Acid-Base Excess: 7 mmol/L — ABNORMAL HIGH (ref 0.0–2.0)
Bicarbonate: 31.7 mmol/L — ABNORMAL HIGH (ref 20.0–28.0)
Calcium, Ion: 1.27 mmol/L (ref 1.15–1.40)
HCT: 51 % (ref 39.0–52.0)
Hemoglobin: 17.3 g/dL — ABNORMAL HIGH (ref 13.0–17.0)
O2 Saturation: 99 %
Potassium: 4.1 mmol/L (ref 3.5–5.1)
Sodium: 146 mmol/L — ABNORMAL HIGH (ref 135–145)
TCO2: 33 mmol/L — ABNORMAL HIGH (ref 22–32)
pCO2, Ven: 42.2 mmHg — ABNORMAL LOW (ref 44.0–60.0)
pH, Ven: 7.483 — ABNORMAL HIGH (ref 7.250–7.430)
pO2, Ven: 128 mmHg — ABNORMAL HIGH (ref 32.0–45.0)

## 2019-11-07 LAB — CBG MONITORING, ED
Glucose-Capillary: 137 mg/dL — ABNORMAL HIGH (ref 70–99)
Glucose-Capillary: 154 mg/dL — ABNORMAL HIGH (ref 70–99)
Glucose-Capillary: 125 mg/dL — ABNORMAL HIGH (ref 70–99)

## 2019-11-07 LAB — HEMOGLOBIN A1C
Hgb A1c MFr Bld: 6.1 % — ABNORMAL HIGH (ref 4.8–5.6)
Mean Plasma Glucose: 128.37 mg/dL

## 2019-11-07 LAB — BRAIN NATRIURETIC PEPTIDE: B Natriuretic Peptide: 767.4 pg/mL — ABNORMAL HIGH (ref 0.0–100.0)

## 2019-11-07 LAB — HIV ANTIBODY (ROUTINE TESTING W REFLEX): HIV Screen 4th Generation wRfx: NONREACTIVE

## 2019-11-07 MED ORDER — METHYLPREDNISOLONE SODIUM SUCC 125 MG IJ SOLR
60.0000 mg | Freq: Two times a day (BID) | INTRAMUSCULAR | Status: DC
  Administered 2019-11-07 – 2019-11-09 (×4): 60 mg via INTRAVENOUS
  Filled 2019-11-07 (×4): qty 2

## 2019-11-07 MED ORDER — SODIUM CHLORIDE 0.9 % IV SOLN
2.0000 g | INTRAVENOUS | Status: AC
  Administered 2019-11-08 – 2019-11-12 (×5): 2 g via INTRAVENOUS
  Filled 2019-11-07 (×3): qty 2
  Filled 2019-11-07 (×2): qty 20
  Filled 2019-11-07: qty 2

## 2019-11-07 MED ORDER — SODIUM CHLORIDE 0.9 % IV SOLN
500.0000 mg | INTRAVENOUS | Status: AC
  Administered 2019-11-08 – 2019-11-11 (×4): 500 mg via INTRAVENOUS
  Filled 2019-11-07 (×4): qty 500

## 2019-11-07 MED ORDER — SODIUM CHLORIDE 0.9 % IV SOLN
500.0000 mg | Freq: Once | INTRAVENOUS | Status: AC
  Administered 2019-11-07: 500 mg via INTRAVENOUS
  Filled 2019-11-07: qty 500

## 2019-11-07 MED ORDER — SODIUM CHLORIDE 0.9 % IV SOLN
1.0000 g | Freq: Once | INTRAVENOUS | Status: AC
  Administered 2019-11-07: 1 g via INTRAVENOUS
  Filled 2019-11-07: qty 10

## 2019-11-07 MED ORDER — SODIUM CHLORIDE 0.9% FLUSH
3.0000 mL | Freq: Two times a day (BID) | INTRAVENOUS | Status: DC
  Administered 2019-11-07 – 2019-11-15 (×15): 3 mL via INTRAVENOUS

## 2019-11-07 MED ORDER — ASPIRIN EC 81 MG PO TBEC
81.0000 mg | DELAYED_RELEASE_TABLET | Freq: Every day | ORAL | Status: DC
  Administered 2019-11-07 – 2019-11-14 (×6): 81 mg via ORAL
  Filled 2019-11-07 (×7): qty 1

## 2019-11-07 MED ORDER — INSULIN ASPART 100 UNIT/ML ~~LOC~~ SOLN
0.0000 [IU] | Freq: Every day | SUBCUTANEOUS | Status: DC
  Administered 2019-11-13: 2 [IU] via SUBCUTANEOUS

## 2019-11-07 MED ORDER — ATENOLOL 25 MG PO TABS
25.0000 mg | ORAL_TABLET | Freq: Every day | ORAL | Status: DC
  Administered 2019-11-07 – 2019-11-14 (×6): 25 mg via ORAL
  Filled 2019-11-07 (×7): qty 1

## 2019-11-07 MED ORDER — INSULIN ASPART 100 UNIT/ML ~~LOC~~ SOLN
0.0000 [IU] | Freq: Three times a day (TID) | SUBCUTANEOUS | Status: DC
  Administered 2019-11-07: 2 [IU] via SUBCUTANEOUS
  Administered 2019-11-07: 3 [IU] via SUBCUTANEOUS
  Administered 2019-11-08: 2 [IU] via SUBCUTANEOUS
  Administered 2019-11-09: 3 [IU] via SUBCUTANEOUS
  Administered 2019-11-09 – 2019-11-10 (×2): 2 [IU] via SUBCUTANEOUS
  Administered 2019-11-13: 3 [IU] via SUBCUTANEOUS
  Administered 2019-11-14 (×2): 2 [IU] via SUBCUTANEOUS
  Administered 2019-11-15: 3 [IU] via SUBCUTANEOUS

## 2019-11-07 MED ORDER — FLUTICASONE-UMECLIDIN-VILANT 100-62.5-25 MCG/INH IN AEPB: 1.0000 | INHALATION_SPRAY | Freq: Every day | RESPIRATORY_TRACT | Status: DC

## 2019-11-07 MED ORDER — PRAVASTATIN SODIUM 40 MG PO TABS: 80.0000 mg | ORAL_TABLET | Freq: Every day | ORAL | Status: DC

## 2019-11-07 MED ORDER — MIRTAZAPINE 15 MG PO TABS
45.0000 mg | ORAL_TABLET | Freq: Every day | ORAL | Status: DC
  Administered 2019-11-07 – 2019-11-14 (×6): 45 mg via ORAL
  Filled 2019-11-07: qty 1
  Filled 2019-11-07 (×8): qty 3

## 2019-11-07 MED ORDER — MOMETASONE FURO-FORMOTEROL FUM 200-5 MCG/ACT IN AERO
2.0000 | INHALATION_SPRAY | Freq: Two times a day (BID) | RESPIRATORY_TRACT | Status: DC
  Administered 2019-11-07 – 2019-11-15 (×15): 2 via RESPIRATORY_TRACT
  Filled 2019-11-07 (×2): qty 8.8

## 2019-11-07 MED ORDER — AMLODIPINE BESYLATE 5 MG PO TABS
5.0000 mg | ORAL_TABLET | Freq: Every day | ORAL | Status: DC
  Administered 2019-11-07 – 2019-11-14 (×6): 5 mg via ORAL
  Filled 2019-11-07 (×7): qty 1

## 2019-11-07 MED ORDER — PRAVASTATIN SODIUM 40 MG PO TABS
40.0000 mg | ORAL_TABLET | Freq: Every day | ORAL | Status: DC
  Administered 2019-11-07 – 2019-11-14 (×6): 40 mg via ORAL
  Filled 2019-11-07 (×6): qty 1

## 2019-11-07 MED ORDER — PRAVASTATIN SODIUM 40 MG PO TABS: 40.0000 mg | ORAL_TABLET | Freq: Every day | ORAL | Status: DC

## 2019-11-07 MED ORDER — CLOPIDOGREL BISULFATE 75 MG PO TABS
75.0000 mg | ORAL_TABLET | Freq: Every day | ORAL | Status: DC
  Administered 2019-11-07 – 2019-11-15 (×7): 75 mg via ORAL
  Filled 2019-11-07 (×7): qty 1

## 2019-11-07 MED ORDER — ALBUTEROL SULFATE (2.5 MG/3ML) 0.083% IN NEBU: 2.5000 mg | INHALATION_SOLUTION | RESPIRATORY_TRACT | Status: DC | PRN

## 2019-11-07 MED ORDER — METHYLPREDNISOLONE SODIUM SUCC 125 MG IJ SOLR
125.0000 mg | Freq: Once | INTRAMUSCULAR | Status: AC
  Administered 2019-11-07: 125 mg via INTRAVENOUS
  Filled 2019-11-07: qty 2

## 2019-11-07 MED ORDER — BUSPIRONE HCL 10 MG PO TABS
5.0000 mg | ORAL_TABLET | Freq: Two times a day (BID) | ORAL | Status: DC
  Administered 2019-11-07 – 2019-11-15 (×13): 5 mg via ORAL
  Filled 2019-11-07 (×15): qty 1

## 2019-11-07 MED ORDER — ALBUTEROL SULFATE (2.5 MG/3ML) 0.083% IN NEBU
5.0000 mg | INHALATION_SOLUTION | Freq: Once | RESPIRATORY_TRACT | Status: DC
  Filled 2019-11-07: qty 6

## 2019-11-07 MED ORDER — IPRATROPIUM-ALBUTEROL 0.5-2.5 (3) MG/3ML IN SOLN
3.0000 mL | Freq: Four times a day (QID) | RESPIRATORY_TRACT | Status: DC
  Administered 2019-11-07 – 2019-11-09 (×8): 3 mL via RESPIRATORY_TRACT
  Filled 2019-11-07 (×8): qty 3

## 2019-11-07 MED ORDER — LACTATED RINGERS IV SOLN: INTRAVENOUS | Status: DC

## 2019-11-07 MED ORDER — MAGNESIUM SULFATE 2 GM/50ML IV SOLN
2.0000 g | Freq: Once | INTRAVENOUS | Status: AC
  Administered 2019-11-07: 2 g via INTRAVENOUS
  Filled 2019-11-07: qty 50

## 2019-11-07 MED ORDER — ENOXAPARIN SODIUM 40 MG/0.4ML ~~LOC~~ SOLN
40.0000 mg | SUBCUTANEOUS | Status: DC
  Administered 2019-11-07 – 2019-11-15 (×9): 40 mg via SUBCUTANEOUS
  Filled 2019-11-07 (×9): qty 0.4

## 2019-11-07 NOTE — Progress Notes (Signed)
Patient is not ready to go on BIPAP/CPAP yet. Patient informed to call when he is ready.

## 2019-11-07 NOTE — ED Notes (Signed)
PT c/o pain with acid reflux and cough. PT is requesting medication. DR paged.

## 2019-11-07 NOTE — ED Notes (Signed)
RT at bedside setting up Bipap

## 2019-11-07 NOTE — H&P (Signed)
History and Physical    ASAD KEEVEN EHM:[id number] DOB: [birth year] DOA: 11/07/2019  PCP: Lucianne Lei, MD Consultants:  Melvyn Novas - pulmonology; Eads - psychiatry; Marlou Porch - cardiology Patient coming from:  Home - lives with wife; NOK: Wife, [name], [phone]  Chief Complaint: SOB  HPI: [name] is a 69 y.o. male with medical history significant of OSA on CPAP; CAD; HTN; HLD; DM; and COPD on 4L home O2 presenting with SOB.  His wife reports that he is having trouble breathing, sweating, UTI - dysuria.  His breathing problems started first 4 days ago.   No change in cough.  No fever.  He vomited once, not eating anything in 3-4 days.  He has been using his CPAP 24/7 with some improvement.  No sick contacts.  He developed urinary symptoms 4 days ago as well  - dysuria, difficulty stopping/starting.  He was not started on antibiotics for this.  Not able to sleep.     ED Course:  H/o COPD on 4L home O2.  Increased SOB and wheezing x 4 days.  Used CPAP at home.  Refused transport yesterday.  Here, with O2 sats 80s.  Placed on BIPAP.  CXR with infiltrate, leukocytosis.  COVID negative.  Given antibiotics, solumedrol, magnesium.  Review of Systems: Unable to perform - patient somnolent and on BIPAP  Ambulatory Status:  Ambulates with a walker, walks little  COVID Vaccine Status:  Complete  Past Medical History:  Diagnosis Date  . Anxiety   . Arthritis   . COPD (chronic obstructive pulmonary disease) ([place])   . Coronary artery disease    x4  . Depression   . Diabetes mellitus without complication ([place])   . GERD (gastroesophageal reflux disease)    tums will relieve  . HLD (hyperlipidemia)   . HTN (hypertension)   . Obstructive sleep apnea    sleep study 2006, uses CPAP, pt does not know cpap settings  . On home oxygen therapy    2L via nasal cannula prn  . Shortness of breath    exertion    Past Surgical History:  Procedure Laterality Date  . CARDIAC CATHETERIZATION   2003,2010   x4   . COLONOSCOPY WITH PROPOFOL N/A 08/30/2014   Procedure: COLONOSCOPY WITH PROPOFOL;  Surgeon: Carol Ada, MD;  Location: [facility];  Service: Endoscopy;  Laterality: N/A;  h&p in file   . COLONOSCOPY WITH PROPOFOL N/A 07/08/2017   Procedure: COLONOSCOPY WITH PROPOFOL;  Surgeon: Carol Ada, MD;  Location: [facility];  Service: Endoscopy;  Laterality: N/A;  . CORONARY ANGIOPLASTY     stents x 4  . DENTAL SURGERY    . TOTAL HIP ARTHROPLASTY  04/06/2012   Procedure: TOTAL HIP ARTHROPLASTY;  Surgeon: Sharmon Revere, MD;  Location: [facility];  Service: Orthopedics;  Laterality: Right;  . TOTAL HIP ARTHROPLASTY Left 04/03/2013   Procedure: LEFT TOTAL HIP ARTHROPLASTY;  Surgeon: Sharmon Revere, MD;  Location: [facility];  Service: Orthopedics;  Laterality: Left;    Social History   Socioeconomic History  . Marital status: Married    Spouse name: Not on file  . Number of children: Not on file  . Years of education: Not on file  . Highest education level: Not on file  Occupational History  . Occupation: retired  Tobacco Use  . Smoking status: Current Every Day Smoker    Packs/day: 0.25    Years: 45.00    Pack years: 11.25    Types: Cigarettes, E-cigarettes  .  Smokeless tobacco: Never Used  . Tobacco comment: stopped smoking in 2018, but still using E-cigs  Vaping Use  . Vaping Use: Never used  Substance and Sexual Activity  . Alcohol use: No    Alcohol/week: 0.0 standard drinks    Comment: Reports drinks a beer maybe 4 times a year.  . Drug use: Yes    Types: Marijuana    Comment: uses marijuana twice monthly  . Sexual activity: Yes    Partners: Female    Birth control/protection: None  Other Topics Concern  . Not on file  Social History Narrative  . Not on file   Social Determinants of Health   Financial Resource Strain:   . Difficulty of Paying Living Expenses:   Food Insecurity:   . Worried About Charity fundraiser in the Last Year:   . Academic librarian in the Last Year:   Transportation Needs:   . Film/video editor (Medical):   Marland Kitchen Lack of Transportation (Non-Medical):   Physical Activity:   . Days of Exercise per Week:   . Minutes of Exercise per Session:   Stress:   . Feeling of Stress :   Social Connections:   . Frequency of Communication with Friends and Family:   . Frequency of Social Gatherings with Friends and Family:   . Attends Religious Services:   . Active Member of Clubs or Organizations:   . Attends Archivist Meetings:   Marland Kitchen Marital Status:   Intimate Partner Violence:   . Fear of Current or Ex-Partner:   . Emotionally Abused:   Marland Kitchen Physically Abused:   . Sexually Abused:     No Known Allergies  Family History  Problem Relation Age of Onset  . Depression Mother   . Heart disease Father   . Emphysema Father        smoked    Prior to Admission medications   Medication Sig Start Date End Date Taking? Authorizing Provider  albuterol (PROAIR HFA) 108 (90 Base) MCG/ACT inhaler 2 puffs every 4 hours as needed only  if your can't catch your breath 06/24/16   Parrett, Tammy S, NP  amLODipine (NORVASC) 5 MG tablet Take 5 mg by mouth at bedtime.  02/26/11   [provider]  aspirin EC 81 MG tablet Take 81 mg by mouth at bedtime.    [provider]  atenolol (TENORMIN) 25 MG tablet Take 25 mg by mouth at bedtime.  01/13/11   [provider]  benzonatate (TESSALON) 100 MG capsule Take 1 capsule (100 mg total) by mouth every 8 (eight) hours. 08/09/19   Corena Herter, PA-C  budesonide-formoterol (SYMBICORT) 160-4.5 MCG/ACT inhaler Inhale 2 puffs into the lungs 2 (two) times daily. 10/02/19   Tanda Rockers, MD  busPIRone (BUSPAR) 5 MG tablet Take 1 tablet (5 mg total) by mouth 3 (three) times daily. Patient taking differently: Take 5 mg by mouth 2 (two) times daily.  08/28/19   Arfeen, Arlyce Harman, MD  clopidogrel (PLAVIX) 75 MG tablet Take 75 mg by mouth at bedtime.     [provider]  Coenzyme Q10 (COQ10) 100 MG CAPS Take 1 capsule by mouth at bedtime.     [provider]  dextromethorphan-guaiFENesin (MUCINEX DM) 30-600 MG 12hr tablet Take 1 tablet by mouth 2 (two) times daily as needed for cough.     [provider]  doxycycline (VIBRAMYCIN) 100 MG capsule Take 1 capsule (100 mg total) by  mouth 2 (two) times daily. 08/09/19   Corena Herter, PA-C  Fluticasone-Umeclidin-Vilant (TRELEGY ELLIPTA) 100-62.5-25 MCG/INH AEPB Inhale 1 puff into the lungs daily. 07/17/19   Tanda Rockers, MD  metFORMIN (GLUCOPHAGE) 500 MG tablet Take 1,000 mg by mouth at bedtime. 2 tabs by mouth once daily at bedtime 06/22/11   [provider]  mirtazapine (REMERON) 45 MG tablet Take 1 tablet (45 mg total) by mouth at bedtime. 09/25/19   Arfeen, Arlyce Harman, MD  Multiple Vitamin (MULTIVITAMIN WITH MINERALS) TABS Take 1 tablet by mouth at bedtime.     [provider]  nitroGLYCERIN (NITROSTAT) 0.4 MG SL tablet Place 1 tablet (0.4 mg total) under the tongue every 5 (five) minutes as needed for chest pain. 09/07/19   Hilty, Nadean Corwin, MD  omega-3 acid ethyl esters (LOVAZA) 1 g capsule Take by mouth.    [provider]  Omega-3 Fatty Acids (FISH OIL) 1000 MG CAPS Take 1 capsule by mouth daily.    [provider]  OXYGEN Inhale 2-3 L into the lungs as needed. With exertion only     [provider]  pravastatin (PRAVACHOL) 80 MG tablet Take 80 mg by mouth at bedtime.  09/18/15   [provider]  sildenafil (VIAGRA) 25 MG tablet Take 25 mg by mouth See admin instructions. Use as directed    [provider]  testosterone cypionate (DEPOTESTOSTERONE CYPIONATE) 200 MG/ML injection Inject 200 mg into the muscle every 21 ( twenty-one) days.  08/03/18   [provider]  Tiotropium Bromide Monohydrate (SPIRIVA RESPIMAT) 2.5 MCG/ACT AERS Inhale 2 puffs into the lungs daily. 08/08/19   Tanda Rockers, MD  vitamin B-12  (CYANOCOBALAMIN) 1000 MCG tablet Take 1,000 mcg by mouth daily.    [provider]    Physical Exam: Vitals:   11/07/19 1400 11/07/19 1415 11/07/19 1515 11/07/19 1615  BP: 116/89 118/86 121/86 118/85  Pulse: 95 97 93 99  Resp: (!) 25 (!) 27 (!) 21 (!) 25  Temp:      TempSrc:      SpO2: 98% 97% 99% 94%     . General:  Appears calm and comfortable and is NAD, on BIPAP . Eyes:  PERRL, EOMI, normal lids, mild B conjunctival injection . ENT:  grossly normal hearing, lips; BIPAP in place . Neck:  no LAD, masses or thyromegaly . Cardiovascular:  RR with mild tachycardia, no m/r/g. No LE edema.  Marland Kitchen Respiratory:   CTA bilaterally with no wheezes/rales/rhonchi, on BIPAP.  Mildly increased respiratory effort. . Abdomen:  soft, NT, ND, NABS . Skin:  no rash or induration seen on limited exam . Musculoskeletal:  grossly normal tone BUE/BLE, good ROM, no bony abnormality . Psychiatric:  blunted mood and affect, speech limited by BIPAP . Neurologic:  Unable to perform    Radiological Exams on Admission: DG Chest Port 1 View  Result Date: 11/07/2019 CLINICAL DATA:  COPD. EXAM: PORTABLE CHEST 1 VIEW COMPARISON:  None. FINDINGS: Heart size normal. Atherosclerotic changes are noted in the aorta. Mild airspace opacities at the left base have increased slightly. IMPRESSION: Slight increase in mild left basilar airspace disease. While this may represent atelectasis, infection is not excluded. Electronically Signed   By: San Morelle M.D.   On: 11/07/2019 07:43    EKG: Independently reviewed.  Sinus tachycardia with rate 110; nonspecific ST changes with no evidence of acute ischemia   Labs on Admission: I have personally reviewed the available labs and imaging  studies at the time of the admission.  Pertinent labs:   ABG: 7.483/42.2/128.0/31.7 Glucose 153 WBC 27.2 COVID negative   Assessment/Plan Principal Problem:   Acute on chronic respiratory failure with hypoxia  (HCC) Active Problems:   COPD exacerbation (HCC)   Diabetes mellitus without complication (HCC)   Coronary artery disease   HLD (hyperlipidemia)   HTN (hypertension)   Obstructive sleep apnea   Acute on chronic respiratory failure associated with a COPD exacerbation/possible CAP -Patient's shortness of breath is most likely caused by acute COPD exacerbation, although CXR demonstrates a possible LLL infiltrate -He wears qhs CPAP for OSA and was wearing it continuously due to his severe SOB -Significant leukocytosis without fever.  -No current concern for sepsis. -He was placed on BIPAP with improvement in his symptoms. -Will admit to progressive care unit - it seems likely that he will need several days of hospitalization to show sufficient improvement for discharge. -Nebulizers: scheduled Duoneb and prn albuterol -Solu-Medrol 60 mg IV BID  -IV Rocephin and Azithromycin for empiric CAP coverage -Will continue BIPAP as needed; once able to wean to White O2, patient may eat -Continue home Symbicort ([place] substitution) -Hold Spiriva while on Duonebs  DM -Will check A1c -hold Glucophage -Cover with moderate-scale SSI  HTN -Continue Norvasc, Atenolol  HLD -Continue Pravachol -Hold fish oil/Lovaza due to limited inpatient utility  CAD -Continue ASA, Plavix  OSA -Transition to qhs CPAP when no longer needing continuous BIPAP    Note: This patient has been tested and is negative for the novel coronavirus COVID-19.    DVT prophylaxis: Lovenox  Code Status:  Full - confirmed with patient/family Family Communication: Wife was present throughout evaluation Disposition Plan:  The patient is from: home  Anticipated d/c is to: home without [facility] services  Anticipated d/c date will depend on clinical response to treatment, but possibly in the next 2-3 days if he has excellent response to treatment  Patient is currently: acutely ill Consults called:  Nutrition/RT  Admission  status: Admit - It is my clinical opinion that admission to INPATIENT is reasonable and necessary because this patient will require at least 2 midnights in the hospital to treat this condition based on the medical complexity of the problems presented.  Given the aforementioned information, the predictability of an adverse outcome is felt to be significant.    Karmen Bongo MD [facility]   How to contact the [facility] Attending or Consulting provider [place] or covering provider during after hours [place], for this patient?  1. Check the care team in [facility] and look for a) attending/consulting [facility] provider listed and b) the [facility] team listed 2. Log into [email or web address] and use [place]'s universal password to access. If you do not have the password, please contact the hospital operator. 3. Locate the [facility] provider you are looking for under [facility] and page to a number that you can be directly reached. 4. If you still have difficulty reaching the provider, please page the [facility] (Director on Call) for the Hospitalists listed on amion for assistance.   11/07/2019, 5:07 PM

## 2019-11-07 NOTE — ED Triage Notes (Addendum)
Pt transported from home by [facility] c/o difficulty breathing worsening x 4 days, pt utilizing cpap at home for oxygenation.Pt recently dx with UTI,  5mg  albuterol tx in progress, unable to obtain IV access.

## 2019-11-07 NOTE — ED Provider Notes (Signed)
[facility] EMERGENCY DEPARTMENT Provider Note   CSN: [id number] Arrival date & time: 11/07/19  5397     History Chief Complaint  Patient presents with  . Respiratory Distress    [name] is a 69 y.o. male with a past medical history of diabetes, COPD on 3 to 4 L of supplemental oxygen at baseline, OSA, CAD presenting to the ED with a chief complaint of shortness of breath.  Patient states that for the past 4 days he has been having increased shortness of breath, wheezing.  He is trying to use his nighttime CPAP machine during the day to help with his symptoms.  Is also been using his home inhalers including his Symbicort and Spiriva with only minimal improvement in his symptoms.  He states that symptoms got worse today.  He denies any chest pain, abdominal pain or vomiting, fever cough.  Patient states that his last COPD exacerbation was approximately 3 months ago.  HPI     Past Medical History:  Diagnosis Date  . Anxiety   . Arthritis   . COPD (chronic obstructive pulmonary disease) ([place])   . Coronary artery disease    x4  . Depression   . Diabetes mellitus without complication ([place])   . GERD (gastroesophageal reflux disease)    tums will relieve  . HLD (hyperlipidemia)   . HTN (hypertension)   . Obstructive sleep apnea    sleep study 2006, uses CPAP, pt does not know cpap settings  . On home oxygen therapy    2L via nasal cannula prn  . Shortness of breath    exertion    Patient Active Problem List   Diagnosis Date Noted  . Elevated prostate specific antigen (PSA) 02/01/2018  . COPD exacerbation ([place]) 11/11/2015  . Smoker 05/02/2013  . COPD GOLD III with reversibility  05/01/2013  . Chronic respiratory failure with hypoxia ([place]) 05/01/2013  . Depression 06/30/2011    Past Surgical History:  Procedure Laterality Date  . CARDIAC CATHETERIZATION  2003,2010   x4   . COLONOSCOPY WITH PROPOFOL N/A 08/30/2014   Procedure: COLONOSCOPY WITH  PROPOFOL;  Surgeon: Carol Ada, MD;  Location: [facility];  Service: Endoscopy;  Laterality: N/A;  h&p in file   . COLONOSCOPY WITH PROPOFOL N/A 07/08/2017   Procedure: COLONOSCOPY WITH PROPOFOL;  Surgeon: Carol Ada, MD;  Location: [facility];  Service: Endoscopy;  Laterality: N/A;  . CORONARY ANGIOPLASTY     stents x 4  . DENTAL SURGERY    . TOTAL HIP ARTHROPLASTY  04/06/2012   Procedure: TOTAL HIP ARTHROPLASTY;  Surgeon: Sharmon Revere, MD;  Location: [place];  Service: Orthopedics;  Laterality: Right;  . TOTAL HIP ARTHROPLASTY Left 04/03/2013   Procedure: LEFT TOTAL HIP ARTHROPLASTY;  Surgeon: Sharmon Revere, MD;  Location: [place];  Service: Orthopedics;  Laterality: Left;       Family History  Problem Relation Age of Onset  . Depression Mother   . Heart disease Father   . Emphysema Father        smoked    Social History   Tobacco Use  . Smoking status: Former Smoker    Packs/day: 0.25    Years: 45.00    Pack years: 11.25    Types: Cigarettes    Quit date: 11/24/2014    Years since quitting: 4.9  . Smokeless tobacco: Never Used  Vaping Use  . Vaping Use: Never used  Substance Use Topics  . Alcohol use: No  Alcohol/week: 0.0 standard drinks    Comment: Reports drinks a beer maybe 4 times a year.  . Drug use: No    Home Medications Prior to Admission medications   Medication Sig Start Date End Date Taking? Authorizing Provider  albuterol (PROAIR HFA) 108 (90 Base) MCG/ACT inhaler 2 puffs every 4 hours as needed only  if your can't catch your breath 06/24/16   Parrett, Tammy S, NP  amLODipine (NORVASC) 5 MG tablet Take 5 mg by mouth at bedtime.  02/26/11   [provider]  aspirin EC 81 MG tablet Take 81 mg by mouth at bedtime.    [provider]  atenolol (TENORMIN) 25 MG tablet Take 25 mg by mouth at bedtime.  01/13/11   [provider]  benzonatate (TESSALON) 100 MG capsule Take 1 capsule (100 mg total) by mouth every 8 (eight)  hours. 08/09/19   Corena Herter, PA-C  budesonide-formoterol (SYMBICORT) 160-4.5 MCG/ACT inhaler Inhale 2 puffs into the lungs 2 (two) times daily. 10/02/19   Tanda Rockers, MD  busPIRone (BUSPAR) 5 MG tablet Take 1 tablet (5 mg total) by mouth 3 (three) times daily. Patient taking differently: Take 5 mg by mouth 2 (two) times daily.  08/28/19   Arfeen, Arlyce Harman, MD  clopidogrel (PLAVIX) 75 MG tablet Take 75 mg by mouth at bedtime.     [provider]  Coenzyme Q10 (COQ10) 100 MG CAPS Take 1 capsule by mouth at bedtime.     [provider]  dextromethorphan-guaiFENesin (MUCINEX DM) 30-600 MG 12hr tablet Take 1 tablet by mouth 2 (two) times daily as needed for cough.     [provider]  doxycycline (VIBRAMYCIN) 100 MG capsule Take 1 capsule (100 mg total) by mouth 2 (two) times daily. 08/09/19   Corena Herter, PA-C  Fluticasone-Umeclidin-Vilant (TRELEGY ELLIPTA) 100-62.5-25 MCG/INH AEPB Inhale 1 puff into the lungs daily. 07/17/19   Tanda Rockers, MD  metFORMIN (GLUCOPHAGE) 500 MG tablet Take 1,000 mg by mouth at bedtime. 2 tabs by mouth once daily at bedtime 06/22/11   [provider]  mirtazapine (REMERON) 45 MG tablet Take 1 tablet (45 mg total) by mouth at bedtime. 09/25/19   Arfeen, Arlyce Harman, MD  Multiple Vitamin (MULTIVITAMIN WITH MINERALS) TABS Take 1 tablet by mouth at bedtime.     [provider]  nitroGLYCERIN (NITROSTAT) 0.4 MG SL tablet Place 1 tablet (0.4 mg total) under the tongue every 5 (five) minutes as needed for chest pain. 09/07/19   Hilty, Nadean Corwin, MD  omega-3 acid ethyl esters (LOVAZA) 1 g capsule Take by mouth.    [provider]  Omega-3 Fatty Acids (FISH OIL) 1000 MG CAPS Take 1 capsule by mouth daily.    [provider]  OXYGEN Inhale 2-3 L into the lungs as needed. With exertion only     [provider]  pravastatin (PRAVACHOL) 80 MG tablet Take 80 mg by mouth at bedtime.  09/18/15   [provider]  sildenafil (VIAGRA) 25 MG tablet Take 25 mg by mouth See admin instructions. Use as directed    [provider]  testosterone cypionate (DEPOTESTOSTERONE CYPIONATE) 200 MG/ML injection Inject 200 mg into the muscle every 21 ( twenty-one) days.  08/03/18   [provider]  Tiotropium Bromide Monohydrate (SPIRIVA RESPIMAT) 2.5 MCG/ACT AERS Inhale 2 puffs into the lungs daily. 08/08/19   Tanda Rockers, MD  vitamin B-12 (CYANOCOBALAMIN) 1000 MCG tablet Take 1,000 mcg by  mouth daily.    [provider]    Allergies    Patient has no known allergies.  Review of Systems   Review of Systems  Constitutional: Negative for appetite change, chills and fever.  HENT: Negative for ear pain, rhinorrhea, sneezing and sore throat.   Eyes: Negative for photophobia and visual disturbance.  Respiratory: Positive for chest tightness, shortness of breath and wheezing. Negative for cough.   Cardiovascular: Negative for chest pain and palpitations.  Gastrointestinal: Negative for abdominal pain, blood in stool, constipation, diarrhea, nausea and vomiting.  Genitourinary: Negative for dysuria, hematuria and urgency.  Musculoskeletal: Negative for myalgias.  Skin: Negative for rash.  Neurological: Negative for dizziness, weakness and light-headedness.    Physical Exam Updated Vital Signs BP (!) 148/104   Pulse (!) 113   Temp 99 F (37.2 C) (Oral)   Resp (!) 31   SpO2 94%   Physical Exam Vitals and nursing note reviewed.  Constitutional:      General: He is not in acute distress.    Appearance: He is well-developed.  HENT:     Head: Normocephalic and atraumatic.     Nose: Nose normal.  Eyes:     General: No scleral icterus.       Left eye: No discharge.     Conjunctiva/sclera: Conjunctivae normal.  Cardiovascular:     Rate and Rhythm: Regular rhythm. Tachycardia present.     Heart sounds: Normal heart sounds. No murmur heard.  No friction rub. No gallop.     Pulmonary:     Effort: Tachypnea, accessory muscle usage and respiratory distress present.     Breath sounds: Examination of the right-upper field reveals wheezing. Examination of the left-upper field reveals wheezing. Examination of the right-middle field reveals wheezing. Examination of the left-middle field reveals wheezing. Examination of the right-lower field reveals wheezing. Examination of the left-lower field reveals wheezing. Wheezing present.  Abdominal:     General: Bowel sounds are normal. There is no distension.     Palpations: Abdomen is soft.     Tenderness: There is no abdominal tenderness. There is no guarding.  Musculoskeletal:        General: Normal range of motion.     Cervical back: Normal range of motion and neck supple.  Skin:    General: Skin is warm and dry.     Findings: No rash.  Neurological:     Mental Status: He is alert.     Motor: No abnormal muscle tone.     Coordination: Coordination normal.     ED Results / Procedures / Treatments   Labs (all labs ordered are listed, but only abnormal results are displayed) Labs Reviewed  BASIC METABOLIC PANEL - Abnormal; Notable for the following components:      Result Value   Glucose, Bld 153 (*)    Calcium 10.4 (*)    All other components within normal limits  CBC - Abnormal; Notable for the following components:   WBC 27.2 (*)    RBC 6.03 (*)    RDW 16.2 (*)    All other components within normal limits  I-STAT VENOUS BLOOD GAS, ED - Abnormal; Notable for the following components:   pH, Ven 7.483 (*)    pCO2, Ven 42.2 (*)    pO2, Ven 128.0 (*)    Bicarbonate 31.7 (*)    TCO2 33 (*)    Acid-Base Excess 7.0 (*)    Sodium 146 (*)    Hemoglobin 17.3 (*)  All other components within normal limits  SARS CORONAVIRUS 2 BY RT PCR (HOSPITAL ORDER, [facility])  BRAIN NATRIURETIC PEPTIDE    EKG None  Radiology DG Chest Port 1 View  Result Date: 11/07/2019 CLINICAL DATA:   COPD. EXAM: PORTABLE CHEST 1 VIEW COMPARISON:  None. FINDINGS: Heart size normal. Atherosclerotic changes are noted in the aorta. Mild airspace opacities at the left base have increased slightly. IMPRESSION: Slight increase in mild left basilar airspace disease. While this may represent atelectasis, infection is not excluded. Electronically Signed   By: San Morelle M.D.   On: 11/07/2019 07:43    Procedures .Critical Care Performed by: Delia Heady, PA-C Authorized by: Delia Heady, PA-C   Critical care provider statement:    Critical care time (minutes):  35   Critical care was necessary to treat or prevent imminent or life-threatening deterioration of the following conditions:  Cardiac failure, respiratory failure, circulatory failure and CNS failure or compromise   Critical care was time spent personally by me on the following activities:  Development of treatment plan with patient or surrogate, discussions with consultants, evaluation of patient's response to treatment, examination of patient, obtaining history from patient or surrogate, ordering and performing treatments and interventions, ordering and review of laboratory studies, ordering and review of radiographic studies, pulse oximetry, re-evaluation of patient's condition and review of old charts   I assumed direction of critical care for this patient from another provider in my specialty: no     (including critical care time)  Medications Ordered in ED Medications  albuterol (PROVENTIL) (2.5 MG/3ML) 0.083% nebulizer solution 5 mg (has no administration in time range)  methylPREDNISolone sodium succinate (SOLU-MEDROL) 125 mg/2 mL injection 125 mg (has no administration in time range)  magnesium sulfate IVPB 2 g 50 mL (has no administration in time range)  cefTRIAXone (ROCEPHIN) 1 g in sodium chloride 0.9 % 100 mL IVPB (has no administration in time range)  azithromycin (ZITHROMAX) 500 mg in sodium chloride 0.9 % 250 mL IVPB  (has no administration in time range)    ED Course  I have reviewed the triage vital signs and the nursing notes.  Pertinent labs & imaging results that were available during my care of the patient were reviewed by me and considered in my medical decision making (see chart for details).    MDM Rules/Calculators/A&P                          69 year old male with past medical history of diabetes, COPD on 3 to 4 L of oxygen via nasal cannula at baseline, OSA on CPAP, CAD presenting to the ED with a chief complaint of shortness of breath.  Reports increased shortness of breath for the past 4 days with associated wheezing.  He has tried to use his home Symbicort, Spiriva and nighttime CPAP machine during the day to help with his symptoms.  Reports similar but less severe symptoms in March of this year.  On exam patient with accessory muscle usage, tachypneic and tachycardic.  He was receiving albuterol via nebulizer during my initial evaluation.  Patient was placed on BiPAP with improvement.  Lab work ordered and interpreted; appears significant for temperature of 99, CBC with leukocytosis of 27.  VBG showing pH of 7.4, bicarb of 31.  BMP is unremarkable.  EKG without any acute ischemic changes.  Chest x-ray shows possible worsening infiltrate versus atelectasis.  Due to his symptoms today,  leukocytosis, feel that he will benefit from continued BiPAP and antibiotic therapy.  Covid test is negative.  Will admit to medicine service for ongoing management.  All imaging, if done today, including plain films, CT scans, and ultrasounds, independently reviewed by me, and interpretations confirmed via formal radiology reads.  Patient is hemodynamically stable, in NAD, and able to ambulate in the ED. Evaluation does not show pathology that would require ongoing emergent intervention or inpatient treatment. I explained the diagnosis to the patient. Pain has been managed and has no complaints prior to discharge.  Patient is comfortable with above plan and is stable for discharge at this time. All questions were answered prior to disposition. Strict return precautions for returning to the ED were discussed. Encouraged follow up with PCP.   An After Visit Summary was printed and given to the patient.   Portions of this note were generated with [employer]. Dictation errors may occur despite best attempts at proofreading.  Final Clinical Impression(s) / ED Diagnoses Final diagnoses:  COPD exacerbation ([place])  Community acquired pneumonia, unspecified laterality  Hypoxia  Respiratory distress    Rx / DC Orders ED Discharge Orders    None       Delia Heady, PA-C 11/07/19 9292    Merryl Hacker, MD 11/13/19 [phone]

## 2019-11-08 ENCOUNTER — Other Ambulatory Visit: Payer: Self-pay

## 2019-11-08 LAB — CBC
HCT: 43.6 % (ref 39.0–52.0)
Hemoglobin: 14.6 g/dL (ref 13.0–17.0)
MCH: 27.4 pg (ref 26.0–34.0)
MCHC: 33.5 g/dL (ref 30.0–36.0)
MCV: 82 fL (ref 80.0–100.0)
Platelets: 270 10*3/uL (ref 150–400)
RBC: 5.32 MIL/uL (ref 4.22–5.81)
RDW: 15.8 % — ABNORMAL HIGH (ref 11.5–15.5)
WBC: 16.7 10*3/uL — ABNORMAL HIGH (ref 4.0–10.5)
nRBC: 0 % (ref 0.0–0.2)

## 2019-11-08 LAB — BASIC METABOLIC PANEL
Anion gap: 14 (ref 5–15)
BUN: 19 mg/dL (ref 8–23)
CO2: 27 mmol/L (ref 22–32)
Calcium: 9.1 mg/dL (ref 8.9–10.3)
Chloride: 99 mmol/L (ref 98–111)
Creatinine, Ser: 0.88 mg/dL (ref 0.61–1.24)
GFR calc Af Amer: >60 mL/min (ref >60)
GFR calc non Af Amer: >60 mL/min (ref >60)
Glucose, Bld: 146 mg/dL — ABNORMAL HIGH (ref 70–99)
Potassium: 3.8 mmol/L (ref 3.5–5.1)
Sodium: 140 mmol/L (ref 135–145)

## 2019-11-08 LAB — CBG MONITORING, ED: Glucose-Capillary: 119 mg/dL — ABNORMAL HIGH (ref 70–99)

## 2019-11-08 LAB — URINE CULTURE: Culture: NO GROWTH

## 2019-11-08 LAB — GLUCOSE, CAPILLARY
Glucose-Capillary: 125 mg/dL — ABNORMAL HIGH (ref 70–99)
Glucose-Capillary: 153 mg/dL — ABNORMAL HIGH (ref 70–99)

## 2019-11-08 MED ORDER — ALUM & MAG HYDROXIDE-SIMETH 200-200-20 MG/5ML PO SUSP: 30.0000 mL | Freq: Once | ORAL | Status: DC

## 2019-11-08 MED ORDER — ADULT MULTIVITAMIN W/MINERALS CH
1.0000 | ORAL_TABLET | Freq: Every day | ORAL | Status: DC
  Administered 2019-11-08 – 2019-11-15 (×7): 1 via ORAL
  Filled 2019-11-08 (×9): qty 1

## 2019-11-08 MED ORDER — ALUM & MAG HYDROXIDE-SIMETH 200-200-20 MG/5ML PO SUSP
30.0000 mL | Freq: Once | ORAL | Status: AC
  Administered 2019-11-08: 30 mL via ORAL
  Filled 2019-11-08: qty 30

## 2019-11-08 MED ORDER — HYDROCOD POLST-CPM POLST ER 10-8 MG/5ML PO SUER
5.0000 mL | Freq: Once | ORAL | Status: AC
  Administered 2019-11-08: 5 mL via ORAL
  Filled 2019-11-08: qty 5

## 2019-11-08 MED ORDER — ENSURE ENLIVE PO LIQD
237.0000 mL | Freq: Three times a day (TID) | ORAL | Status: DC
  Administered 2019-11-08 – 2019-11-15 (×15): 237 mL via ORAL

## 2019-11-08 NOTE — Progress Notes (Signed)
PROGRESS NOTE  [name]  DOB: 04-Jul-1950  PCP: Lucianne Lei, MD CWC:[id number]  DOA: 11/07/2019  LOS: 1 day   Chief Complaint  Patient presents with  . Respiratory Distress   Brief narrative: Patient is a 69 y.o. male with PMH significant of OSA on CPAP; CAD; HTN; HLD; DM; and COPD on 4L home, bedbound status. Patient presented to the ED on 6/23 with complaint of shortness of breath, cough, wheezing sweating, dysuria ongoing for last 4 days.  Associated with poor appetite.  Because of dyspnea, he was  requiring to use CPAP 24/7 for the duration.   In the ED, oxygen saturation was in 80s.  He was started on BiPAP. Initial blood gas 7.48/42/128/32 WBC count elevated to 27,000 Urinalysis with hazy amber-colored urine, few bacteria, small leukocytes Chest x-ray showed slight increase in mild left basilar airspace disease. EKG sinus tachycardia at 110 bpm, nonspecific ST-T wave changes with no evidence of acute ischemia.  Patient was given antibiotic, Solu-Medrol, magnesium and admitted to hospitalist service for further evaluation management.  Subjective: Patient was seen and examined this morning in ED.  Waiting for bed availability. Not in distress.  On 5 L oxygen by nasal cannula. Chart reviewed No fever, heart rate mostly 90s, blood pressure mostly less than 120 Off BiPAP, oxygen being weaned down, currently on 5 L. Labs from this morning with improving WBC count to 16.7  Assessment/Plan: Acute on chronic respiratory failure  COPD exacerbation Community-acquired pneumonia -Presented with 3 to 4 days of worsening shortness of breath, increasing oxygen dependence, cough and overall declining health.   -Chest x-ray showed left lower lobe infiltrate.  -Currently on Solu-Medrol 60 mg IV twice daily, IV Rocephin, IV azithromycin. -Continue the same. -Continue bronchodilators, -Initially required BiPAP.  Currently on nasal cannula at higher flow.  Wean down as tolerated.   Uses 4 L at home.  Obstructive sleep apnea -Uses nightly CPAP at home.  Continue same in the hospital.  UTI -Presented with dysuria -Urinalysis with hazy amber-colored urine, few bacteria, small leukocytes -IV Rocephin should provide adequate coverage. -Follow-up urine culture report.  Right groin multiple skin tags -Patient says it intermittently bleeds.   -Recommend topicals as an outpatient.  Type 2 diabetes mellitus - A1c 6.1. -Metformin on hold -Sliding scale insulin with Accu-Cheks.  Hypertension -Continue Norvasc, Atenolol  CAD/HLD -Continue ASA, Plavix, pravastatin  Neuro psych meds -On Remeron, buspirone at home       There is no height or weight on file to calculate BMI. Mobility: Encourage ambulation Code Status:   Code Status: Full Code  DVT prophylaxis: enoxaparin (LOVENOX) injection 40 mg Start: 11/07/19 1000  Antimicrobials:  IV Rocephin, IV azithromycin Fluid: None  Diet:  Diet Order            Diet Carb Modified Fluid consistency: Thin; Room service appropriate? Yes  Diet effective ____                 Consultants: None Family Communication:  none at bedside  Status is: Inpatient  Remains inpatient appropriate because:IV treatments appropriate due to intensity of illness or inability to take PO   Dispo: The patient is from: Home              Anticipated d/c is to: Home              Anticipated d/c date is: 2 days              Patient currently is  not medically stable to d/c.   Infusions:  . azithromycin    . cefTRIAXone (ROCEPHIN)  IV    . lactated ringers Stopped (11/07/19 2230)    Scheduled Meds: . amLODipine  5 mg Oral QHS  . aspirin EC  81 mg Oral QHS  . atenolol  25 mg Oral QHS  . busPIRone  5 mg Oral BID  . clopidogrel  75 mg Oral QHS  . enoxaparin (LOVENOX) injection  40 mg Subcutaneous Q24H  . insulin aspart  0-15 Units Subcutaneous TID WC  . insulin aspart  0-5 Units Subcutaneous QHS  . ipratropium-albuterol  3  mL Nebulization Q6H  . methylPREDNISolone (SOLU-MEDROL) injection  60 mg Intravenous Q12H  . mirtazapine  45 mg Oral QHS  . mometasone-formoterol  2 puff Inhalation BID  . pravastatin  40 mg Oral q1800  . sodium chloride flush  3 mL Intravenous Q12H    Antimicrobials: Anti-infectives (From admission, onward)   Start     Dose/Rate Route Frequency Ordered Stop   11/08/19 1000  cefTRIAXone (ROCEPHIN) 2 g in sodium chloride 0.9 % 100 mL IVPB     Discontinue     2 g 200 mL/hr over 30 Minutes Intravenous Every 24 hours 11/07/19 0919 11/13/19 0959   11/08/19 1000  azithromycin (ZITHROMAX) 500 mg in sodium chloride 0.9 % 250 mL IVPB     Discontinue     500 mg 250 mL/hr over 60 Minutes Intravenous Every 24 hours 11/07/19 0919 11/12/19 0959   11/07/19 0800  cefTRIAXone (ROCEPHIN) 1 g in sodium chloride 0.9 % 100 mL IVPB        1 g 200 mL/hr over 30 Minutes Intravenous  Once 11/07/19 0759 11/07/19 1036   11/07/19 0800  azithromycin (ZITHROMAX) 500 mg in sodium chloride 0.9 % 250 mL IVPB        500 mg 250 mL/hr over 60 Minutes Intravenous  Once 11/07/19 0759 11/07/19 1117      PRN meds: albuterol   Objective: Vitals:   11/08/19 0345 11/08/19 0430  BP: 117/81 100/77  Pulse: 81 78  Resp: (!) 23 (!) 23  Temp:    SpO2: 98% 96%    Intake/Output Summary (Last 24 hours) at 11/08/2019 0855 Last data filed at 11/07/2019 2230 Gross per 24 hour  Intake 1000 ml  Output --  Net 1000 ml   There were no vitals filed for this visit. Weight change:  There is no height or weight on file to calculate BMI.   Physical Exam: General exam: Appears calm and comfortable. Not in physical distress.  +Skin: No rashes, lesions or ulcers. HEENT: Atraumatic, normocephalic, supple neck, no obvious bleeding Lungs: CTAB, no wheezing or crackles CVS: S1, S2, M0 GI/Abd soft, nontender, nondistended, bowel sound present CNS: Alert, awake, oriented x3 Psychiatry: Mood appropriate Extremities: No pedal edema,  no calf tenderness  Data Review: I have personally reviewed the laboratory data and studies available.  Recent Labs  Lab 11/07/19 0659 11/07/19 0711 11/08/19 0410  WBC 27.2*  --  16.7*  HGB 16.7 17.3* 14.6  HCT 49.3 51.0 43.6  MCV 81.8  --  82.0  PLT 284  --  270   Recent Labs  Lab 11/07/19 0659 11/07/19 0711 11/08/19 0410  NA 145 146* 140  K 4.3 4.1 3.8  CL 106  --  99  CO2 26  --  27  GLUCOSE 153*  --  146*  BUN 17  --  19  CREATININE 1.00  --  0.88  CALCIUM 10.4*  --  9.1    Signed, Terrilee Croak, MD [facility] Pager: [phone] (Secure Chat preferred). 11/08/2019

## 2019-11-08 NOTE — ED Notes (Signed)
Attempted report, no answer

## 2019-11-08 NOTE — ED Notes (Signed)
Lunch Tray Ordered @ 1052.  

## 2019-11-08 NOTE — ED Notes (Signed)
PT had 2.5 L of fluid pulled off during dialysis, pt b/p 115/94 post dialysis, 02 sat 100% on 3L [place] per Hemodialysis RN

## 2019-11-08 NOTE — Progress Notes (Signed)
Patient placed on CPAP at this time with 6L bled in.

## 2019-11-08 NOTE — Progress Notes (Signed)
Initial Nutrition Assessment  DOCUMENTATION CODES:   Not applicable  INTERVENTION:  As PO intake improves, monitor magnesium, potassium, and phosphorus daily for at least 3 days, MD to replete as needed, as pt is at risk for refeeding syndrome.  Please obtain updated weight.  Ensure Enlive po TID, each supplement provides 350 kcal and 20 grams of protein  MVI daily  NUTRITION DIAGNOSIS:   Inadequate oral intake related to poor appetite as evidenced by per patient/family report.    GOAL:   Patient will meet greater than or equal to 90% of their needs    MONITOR:   PO intake, Labs, I & O's, Supplement acceptance, Weight trends  REASON FOR ASSESSMENT:   Consult Assessment of nutrition requirement/status  ASSESSMENT:   Pt admitted with acute on chronic respiratory failure with hypoxia. PMH includes OSA, CAD, HTN, HLD, DM, COPD, bedbound status.  Pt unavailable at time of RD visit.   Per H&P, pt vomited once and has not had anything to eat in 3-4 days.   No weight from this admission. Will use weight from 08/09/19 to calculate needs (81.6kg).  No PO intake documented.  Labs: CBGs 119-154 Medications: Novolog, Solu-medrol, Remeron  NUTRITION - FOCUSED PHYSICAL EXAM:  Unable to perform at this time, will attempt at follow-up.  Diet Order:   Diet Order            Diet Carb Modified Fluid consistency: Thin; Room service appropriate? Yes  Diet effective ____                 EDUCATION NEEDS:   No education needs have been identified at this time  Skin:  Skin Assessment: Reviewed RN Assessment  Last BM:  unknown  Height:   Ht Readings from Last 1 Encounters:  08/09/19 5\' 10"  (1.778 m)    Weight:   Wt Readings from Last 1 Encounters:  08/09/19 81.6 kg   BMI:  There is no height or weight on file to calculate BMI.  Estimated Nutritional Needs:   Kcal:  2000-2200  Protein:  100-115 grams  Fluid:  >2L/d    Larkin Ina, MS, RD, LDN RD  pager number and weekend/on-call pager number located in [place].

## 2019-11-08 NOTE — Progress Notes (Signed)
Placed pt on cpap for the night. RT will continue to monitor as needed.

## 2019-11-09 DIAGNOSIS — B079 Viral wart, unspecified: Secondary | ICD-10-CM | POA: Insufficient documentation

## 2019-11-09 LAB — GLUCOSE, CAPILLARY
Glucose-Capillary: 123 mg/dL — ABNORMAL HIGH (ref 70–99)
Glucose-Capillary: 161 mg/dL — ABNORMAL HIGH (ref 70–99)
Glucose-Capillary: 97 mg/dL (ref 70–99)
Glucose-Capillary: 118 mg/dL — ABNORMAL HIGH (ref 70–99)

## 2019-11-09 MED ORDER — PANTOPRAZOLE SODIUM 40 MG PO TBEC
40.0000 mg | DELAYED_RELEASE_TABLET | Freq: Every day | ORAL | Status: DC
  Administered 2019-11-09 – 2019-11-15 (×6): 40 mg via ORAL
  Filled 2019-11-09 (×5): qty 1

## 2019-11-09 MED ORDER — DM-GUAIFENESIN ER 30-600 MG PO TB12
1.0000 | ORAL_TABLET | Freq: Two times a day (BID) | ORAL | Status: DC | PRN
  Administered 2019-11-10: 1 via ORAL
  Filled 2019-11-09: qty 1

## 2019-11-09 MED ORDER — METHYLPREDNISOLONE SODIUM SUCC 40 MG IJ SOLR
40.0000 mg | Freq: Two times a day (BID) | INTRAMUSCULAR | Status: DC
  Administered 2019-11-09 – 2019-11-11 (×5): 40 mg via INTRAVENOUS
  Filled 2019-11-09 (×6): qty 1

## 2019-11-09 MED ORDER — UMECLIDINIUM BROMIDE 62.5 MCG/INH IN AEPB
1.0000 | INHALATION_SPRAY | Freq: Every day | RESPIRATORY_TRACT | Status: DC
  Administered 2019-11-10 – 2019-11-15 (×5): 1 via RESPIRATORY_TRACT
  Filled 2019-11-09: qty 7

## 2019-11-09 NOTE — Progress Notes (Signed)
PROGRESS NOTE  [name]  DOB: [birth year]  PCP: Lucianne Lei, MD XIP:[id number]  DOA: 11/07/2019  LOS: 2 days   Chief Complaint  Patient presents with  . Respiratory Distress   Brief narrative: Patient is a 69 y.o.malewith PMH significant ofOSA on CPAP; CAD; HTN; HLD; DM; and COPD on 4L home, bedbound status. Patient presented to the ED on 6/23 with complaint of shortness of breath, cough, wheezing sweating, dysuria ongoing for last 4 days.  Associated with poor appetite.  Because of dyspnea, he was  requiring to use CPAP 24/7 for the duration.   In the ED, oxygen saturation was in 80s.  He was started on BiPAP. Initial blood gas 7.48/42/128/32 WBC count elevated to 27,000 Urinalysis with hazy amber-colored urine, few bacteria, small leukocytes Chest x-ray showed slight increase in mild left basilar airspace disease. EKG sinus tachycardia at 110 bpm, nonspecific ST-T wave changes with no evidence of acute ischemia.  Patient was given antibiotic, Solu-Medrol, magnesium and admitted to hospitalist service for further evaluation management.  Subjective: Patient was seen and examined this morning. Lying down in bed.  He got albuterol earlier.  He says he actually feels more short of breath after that. Complains of heartburn. On 4 L oxygen by nasal cannula.  No wheezing. Chart reviewed  No fever, heart rate mostly 80s, blood pressure mostly less than 120  Assessment/Plan: Acute on chronic respiratory failure  COPD exacerbation Community-acquired pneumonia -Presented with 3 to 4 days of worsening shortness of breath, increasing oxygen dependence, cough and overall declining health.   -Chest x-ray showed left lower lobe infiltrate.  -Currently on Solu-Medrol 60 mg IV twice daily, taper down to 40 mg IV twice daily.  Complains of heartburn.  Start Protonix and as needed Mylanta. -Continue IV Rocephin, IV azithromycin. -Continue Dulera, Spiriva and albuterol as needed from  home. -Initially required BiPAP.  Currently on nasal cannula at higher flow.  Wean down as tolerated.  Uses 4 L at home.  Obstructive sleep apnea -Uses nightly CPAP at home.  Continue same in the hospital.  UTI -Presented with dysuria -Urinalysis with hazy amber-colored urine, few bacteria, small leukocytes -IV Rocephin should provide adequate coverage. -Follow-up urine culture report.  Right groin warts -Reports intermittent bleeding right groin worse for several years. -I ordered for an ambulatory referral to dermatology.  Type 2 diabetes mellitus - A1c 6.1. -Metformin on hold for now. -Sliding scale insulin with Accu-Cheks.  Hypertension -Continue Norvasc, Atenolol  CAD/HLD -Continue ASA, Plavix, pravastatin  Neuro psych meds -On Remeron, buspirone at home  Mobility: PT eval pending.  Nutritional status: Nutrition Problem: Inadequate oral intake Etiology: poor appetite Signs/Symptoms: per patient/family report There is no height or weight on file to calculate BMI. Diet:  Diet Order            Diet Carb Modified Fluid consistency: Thin; Room service appropriate? Yes  Diet effective ____                Code Status:   Code Status: Full Code  DVT prophylaxis:enoxaparin (LOVENOX) injection 40 mg Start: 11/07/19 1000  Antimicrobials:  IV Rocephin and IV azithromycin Fluid: Stop fluid today.  Infusions:  . azithromycin 500 mg (11/09/19 0819)  . cefTRIAXone (ROCEPHIN)  IV Stopped (11/08/19 1115)   Consultants: None Family Communication: : called and updated patient's wife this morning. Status is: Inpatient  Remains inpatient appropriate because: On IV steroids, IV antibiotics, pending PT evaluation.  Dispo: The patient is from: Home  Anticipated d/c is to: Home              Anticipated d/c date is: 1 day.               Patient currently is not medically stable to d/c.   Scheduled Meds: . amLODipine  5 mg Oral QHS  . aspirin EC  81 mg  Oral QHS  . atenolol  25 mg Oral QHS  . busPIRone  5 mg Oral BID  . clopidogrel  75 mg Oral QHS  . enoxaparin (LOVENOX) injection  40 mg Subcutaneous Q24H  . feeding supplement (ENSURE ENLIVE)  237 mL Oral TID BM  . insulin aspart  0-15 Units Subcutaneous TID WC  . insulin aspart  0-5 Units Subcutaneous QHS  . methylPREDNISolone (SOLU-MEDROL) injection  40 mg Intravenous Q12H  . mirtazapine  45 mg Oral QHS  . mometasone-formoterol  2 puff Inhalation BID  . multivitamin with minerals  1 tablet Oral Daily  . pantoprazole  40 mg Oral Daily  . pravastatin  40 mg Oral q1800  . sodium chloride flush  3 mL Intravenous Q12H  . umeclidinium bromide  1 puff Inhalation Daily    Antimicrobials: Anti-infectives (From admission, onward)   Start     Dose/Rate Route Frequency Ordered Stop   11/08/19 1000  cefTRIAXone (ROCEPHIN) 2 g in sodium chloride 0.9 % 100 mL IVPB     Discontinue     2 g 200 mL/hr over 30 Minutes Intravenous Every 24 hours 11/07/19 0919 11/13/19 0959   11/08/19 1000  azithromycin (ZITHROMAX) 500 mg in sodium chloride 0.9 % 250 mL IVPB     Discontinue     500 mg 250 mL/hr over 60 Minutes Intravenous Every 24 hours 11/07/19 0919 11/12/19 0959   11/07/19 0800  cefTRIAXone (ROCEPHIN) 1 g in sodium chloride 0.9 % 100 mL IVPB        1 g 200 mL/hr over 30 Minutes Intravenous  Once 11/07/19 0759 11/07/19 1036   11/07/19 0800  azithromycin (ZITHROMAX) 500 mg in sodium chloride 0.9 % 250 mL IVPB        500 mg 250 mL/hr over 60 Minutes Intravenous  Once 11/07/19 0759 11/07/19 1117      PRN meds: albuterol, dextromethorphan-guaiFENesin   Objective: Vitals:   11/09/19 0720 11/09/19 0723  BP: (!) 143/92   Pulse: 86 86  Resp: (!) 25 17  Temp: 98.1 F (36.7 C)   SpO2: 93% 97%    Intake/Output Summary (Last 24 hours) at 11/09/2019 0847 Last data filed at 11/09/2019 0400 Gross per 24 hour  Intake 573.53 ml  Output 600 ml  Net -26.47 ml   There were no vitals filed for this  visit. Weight change:  There is no height or weight on file to calculate BMI.   Physical Exam: General exam: Appears calm and comfortable.  Skin: No rashes, lesions or ulcers. HEENT: Atraumatic, normocephalic, supple neck, no obvious bleeding Lungs: Clear to auscultation bilaterally.  No wheezing on my examination CVS: Regular rate and rhythm, no murmur GI/Abd soft, nontender, nondistended, bowel sound present CNS: Alert, awake, oriented x3 Psychiatry: Mood appropriate Extremities: No pedal edema, no calf tenderness  Data Review: I have personally reviewed the laboratory data and studies available.  Recent Labs  Lab 11/07/19 0659 11/07/19 0711 11/08/19 0410  WBC 27.2*  --  16.7*  HGB 16.7 17.3* 14.6  HCT 49.3 51.0 43.6  MCV 81.8  --  82.0  PLT 284  --  270   Recent Labs  Lab 11/07/19 0659 11/07/19 0711 11/08/19 0410  NA 145 146* 140  K 4.3 4.1 3.8  CL 106  --  99  CO2 26  --  27  GLUCOSE 153*  --  146*  BUN 17  --  19  CREATININE 1.00  --  0.88  CALCIUM 10.4*  --  9.1    Signed, Terrilee Croak, MD [facility] Pager: [phone] (Secure Chat preferred). 11/09/2019

## 2019-11-09 NOTE — Plan of Care (Signed)

## 2019-11-09 NOTE — TOC Initial Note (Signed)
Transition of Care [facility]) - Initial/Assessment Note    Patient Details  Name: [name] MRN: [id number] Date of Birth: 1950/09/26  Transition of Care [facility]) CM/SW Contact:    Emeterio Reeve, [place] Phone Number: 11/09/2019, 4:29 PM  Clinical Narrative:                  CSW met with pt bedside. Pts wife was present. Pt stated PTA he was living at home with his wife. Pt reports that she provides all of his care. Pt states that he doesn't walk well and needs lots of support. Pt reports having all the neccessary DME at home.   CSW reviewed PT/OT recc of SNF. Pt stated that he is not interested in SNF at this time. Pt and wife feel that he can return home since she has already been providing his care. Wife states she is ok with this plan. CSW explained that [facility] pt and ot can be set up to come to the house. Wife inquired about personal care coming to the home. CSW explained that PCS are private pay and not covered by insurance.   Pts wife stated they do not have a preference for [facility] agencies but request to not use [facility], they did not have a positive experience with them in the past.    Expected Discharge Plan: [place] Barriers to Discharge: Continued Medical Work up   Patient Goals and CMS Choice Patient states their goals for this hospitalization and ongoing recovery are:: To return home with my wife      Expected Discharge Plan and Services Expected Discharge Plan: [place]       Living arrangements for the past 2 months: Single Family Home                                      Prior Living Arrangements/Services Living arrangements for the past 2 months: Single Family Home Lives with:: Spouse Patient language and need for interpreter reviewed:: Yes Do you feel safe going back to the place where you live?: Yes      Need for Family Participation in Patient Care: Yes (Comment) Care giver support system in place?: Yes  (comment) Current home services: DME Criminal Activity/Legal Involvement Pertinent to Current Situation/Hospitalization: No - Comment as needed  Activities of Daily Living Home Assistive Devices/Equipment: CPAP ADL Screening (condition at time of admission) Patient's cognitive ability adequate to safely complete daily activities?: Yes Is the patient deaf or have difficulty hearing?: No Does the patient have difficulty seeing, even when wearing glasses/contacts?: No Does the patient have difficulty concentrating, remembering, or making decisions?: No Patient able to express need for assistance with ADLs?: Yes Does the patient have difficulty dressing or bathing?: Yes Independently performs ADLs?: No Communication: Needs assistance Is this a change from baseline?: Pre-admission baseline Dressing (OT): Needs assistance Is this a change from baseline?: Pre-admission baseline Grooming: Needs assistance Is this a change from baseline?: Pre-admission baseline Feeding: Independent Toileting: Needs assistance Is this a change from baseline?: Change from baseline, expected to last <3 days In/Out Bed: Needs assistance Is this a change from baseline?: Pre-admission baseline Walks in Home: Needs assistance Is this a change from baseline?: Pre-admission baseline Weakness of Legs: Both Weakness of Arms/Hands: Both  Permission Sought/Granted Permission sought to share information with : Family Supports, Chartered certified accountant granted  to share information with : Yes, Verbal Permission Granted     Permission granted to share info w AGENCY: [place] agencies        Emotional Assessment Appearance:: Appears stated age Attitude/Demeanor/Rapport: Engaged Affect (typically observed): Appropriate Orientation: : Oriented to Situation, Oriented to  Time, Oriented to Place, Oriented to Self Alcohol / Substance Use: Not Applicable Psych Involvement: No (comment)  Admission diagnosis:   Respiratory distress [R06.03] Hypoxia [R09.02] COPD exacerbation (HCC) [J44.1] Acute on chronic respiratory failure with hypoxia ([place]) [J96.21] Community acquired pneumonia, unspecified laterality [J18.9] Patient Active Problem List   Diagnosis Date Noted  . Warts 11/09/2019  . Acute on chronic respiratory failure with hypoxia ([place]) 11/07/2019  . Diabetes mellitus without complication ([place])   . Coronary artery disease   . HLD (hyperlipidemia)   . HTN (hypertension)   . Obstructive sleep apnea   . Elevated prostate specific antigen (PSA) 02/01/2018  . COPD exacerbation ([place]) 11/11/2015  . Smoker 05/02/2013  . COPD GOLD III with reversibility  05/01/2013  . Chronic respiratory failure with hypoxia ([place]) 05/01/2013  . Depression 06/30/2011   PCP:  Lucianne Lei, MD Pharmacy:  No Pharmacies Listed    Social Determinants of Health (SDOH) Interventions    Readmission Risk Interventions No flowsheet data found.  Emeterio Reeve, [place], [place] Social Worker [phone]

## 2019-11-09 NOTE — Evaluation (Signed)
Physical Therapy Evaluation Patient Details Name: [name] MRN: [id number] DOB: Jan 15, 1951 Today's Date: 11/09/2019   History of Present Illness  Patient is a 69 y.o. male with PMH significant of OSA on CPAP; CAD; HTN; HLD; DM; and COPD on 4L home. Patient presented to the ED on 6/23 with complaint of shortness of breath, cough, wheezing sweating, dysuria ongoing for last 4 days.  Pt admitted with acute on chronic resp failure with COPD exacerbation and PNE.  Clinical Impression  Pt admitted with above diagnosis. Pt was very limited today due to anxiety/increased RR and chronic decreased mobility. Pt required cues for relaxation and transfer techniques.  He was able to transfer to EOB with mod A but unable to progress OOB today.  Additionally, spent increased time educating on safe mobility, attainable goal setting, PT recommendations, and trying to gradually increase activity with the understanding that he will have limitations due to chronic issues of back and hip pain as well as COPD.  Pt currently with functional limitations due to the deficits listed below (see PT Problem List). Pt will benefit from skilled PT to increase their independence and safety with mobility to allow discharge to the venue listed below.  Do recommend SNF at this time, but pt and family additionally need to consider long term options including placement or increased assist at home.       Follow Up Recommendations SNF    Equipment Recommendations  Other (comment) (would benefit from w/c but reports won't fit in house)    Recommendations for Other Services       Precautions / Restrictions Precautions Precautions: Fall Precaution Comments: anxiety; monitor O2      Mobility  Bed Mobility Overal bed mobility: Needs Assistance Bed Mobility: Supine to Sit;Sit to Supine     Supine to sit: Mod assist Sit to supine: Mod assist   General bed mobility comments: increased time, cues for relaxation and slow  controlled movements rather than hurried/impulsive movements that are ineffective and require more energy  Transfers Overall transfer level: Needs assistance               General transfer comment: deferred due to weakness, anxiety, decreased O2 sats and increased RR  Ambulation/Gait                Stairs            Wheelchair Mobility    Modified Rankin (Stroke Patients Only)       Balance Overall balance assessment: Needs assistance Sitting-balance support: No upper extremity supported;Feet supported Sitting balance-Leahy Scale: Good Sitting balance - Comments: Pt sat EOB for ~10-15 minutes with SBA.  Upon sitting EOB pt with increased RR and anxiety.  Reports needs to just sit a few mins.  Took 5 mins pursed lip breathing to slow RR.  Pt declined and was unsafe for transfer to chair, but wanted to sit EOB for a few mins.       Standing balance comment: deferred                             Pertinent Vitals/Pain Pain Assessment: No/denies pain    Home Living Family/patient expects to be discharged to:: Private residence Living Arrangements: Spouse/significant other Available Help at Discharge: Family;Available 24 hours/day Type of Home: House Home Access: Stairs to enter Entrance Stairs-Rails: Right;Left;Can reach both Entrance Stairs-Number of Steps: 3 Home Layout: One level Home Equipment: Shower seat;Grab bars -  tub/shower;Crutches;Walker - 2 wheels;Cane - single point;Bedside commode;Walker - 4 wheels      Prior Function Level of Independence: Needs assistance   Gait / Transfers Assistance Needed: Not much walking over the past several year other than 3-5' to bsc; uses rollator to sit on and pushes with feet to get to other rooms  ADL's / Homemaking Assistance Needed: Wife assisting with sponge baths and dressing; pt could perform toielting ADLs  Comments: Expresses fear of falling; last time he went out to MD appt was in March.    Pt on 2.5-3.5 LPM O2 at home.  Required increased time to obtain accurate PLOF/timeline.  Initially it seemed that pt had decline since recent PNE, then reports since start of "COVID shut down" a year ago, but then with further questioning pt has had difficulty with mobility since hip surgeries in 2013-2014.  His current PLOF has been this way for several years.     Hand Dominance        Extremity/Trunk Assessment   Upper Extremity Assessment Upper Extremity Assessment: LUE deficits/detail;RUE deficits/detail RUE Deficits / Details: ROM WFL; MMT 4/5 LUE Deficits / Details: ROM WFL; MMT 4/5    Lower Extremity Assessment Lower Extremity Assessment: LLE deficits/detail;RLE deficits/detail RLE Deficits / Details: ROM noted tight end feel with knee ext lacking ~ 10 degrees, otherwise WFL;  MMT 4/5 LLE Deficits / Details: ROM noted tight end feel with knee ext lacking ~ 10 degrees, otherwise WFL;  MMT 4/5    Cervical / Trunk Assessment Cervical / Trunk Assessment: Normal  Communication   Communication: No difficulties  Cognition Arousal/Alertness: Awake/alert Behavior During Therapy: WFL for tasks assessed/performed Overall Cognitive Status: Within Functional Limits for tasks assessed Area of Impairment: Problem solving;Safety/judgement                         Safety/Judgement: Decreased awareness of safety;Decreased awareness of deficits     General Comments: Required repeated and detailed questions to get accurate PLOF.  Decreased awareness of deficits - wanting to try crutches, setting goals for more cooking in the kitchen and long distance walking      General Comments General comments (skin integrity, edema, etc.): Pt and wife educated on PT role and POC.  Discussed recommendation of SNF at d/c and role of SNF.  Pt with crutches in room and wanting PT to help him learn to use crutches.  Did set height per pt request, but also discussed that crutches can be unstable and  require significant amount of energy.  Strongly advised against crutches at this time.  Also, pt discussing goals of wanting to walk longer distances and being able to stand and cook in kitchen.  However, with questioning determined that pt has only been able to ambulate 3-4' for the past several years with limitations due to COPD, anxiety, back pain, and hip pain.  Discussed setting short term attainable goals to begin with (transferring with supervision, walking 5-10', etc).  Pt and wife with questions regarding COPD  Regarding oxygen dropping with activity and anxiety.  Discussed that this does occur with COPD and that pt is doing well with pursed lip breathing.  Discussed gradual increase in mobility as able, but will still have deficits related to COPD.    Pt on 4 LPM at arrival with sats 93%.  Down to 90% with bed mobility and request PT increase O2 prior to pt moving more.  Increased to 5 LPM for activity.  Sats down to 89% with initial transfer but stabilized 92% while sitting.  Pt did have increased RR 37 with activity, but provided cues for pursed lip breathing and relaxation and able to decrease to 28.  Placed back on 4 LPM with sats 94% when supine.    Exercises     Assessment/Plan    PT Assessment Patient needs continued PT services  PT Problem List Decreased strength;Decreased mobility;Decreased safety awareness;Decreased coordination;Decreased range of motion;Decreased knowledge of precautions;Decreased activity tolerance;Cardiopulmonary status limiting activity;Decreased cognition;Decreased balance;Decreased knowledge of use of DME       PT Treatment Interventions DME instruction;Therapeutic activities;Gait training;Therapeutic exercise;Patient/family education;Stair training;Balance training;Functional mobility training;Neuromuscular re-education    PT Goals (Current goals can be found in the Care Plan section)  Acute Rehab PT Goals Patient Stated Goal: get stronger PT Goal  Formulation: With patient/family Time For Goal Achievement: 11/23/19 Potential to Achieve Goals: Good    Frequency Min 3X/week   Barriers to discharge Inaccessible home environment      Co-evaluation               AM-PAC PT "6 Clicks" Mobility  Outcome Measure Help needed turning from your back to your side while in a flat bed without using bedrails?: A Little Help needed moving from lying on your back to sitting on the side of a flat bed without using bedrails?: A Lot Help needed moving to and from a bed to a chair (including a wheelchair)?: Total Help needed standing up from a chair using your arms (e.g., wheelchair or bedside chair)?: Total Help needed to walk in hospital room?: Total Help needed climbing 3-5 steps with a railing? : Total 6 Click Score: 9    End of Session Equipment Utilized During Treatment: Oxygen Activity Tolerance: Patient limited by fatigue Patient left: in bed;with call bell/phone within reach;with bed alarm set Nurse Communication: Mobility status PT Visit Diagnosis: Other abnormalities of gait and mobility (R26.89);Muscle weakness (generalized) (M62.81)    Time: 1497-0263 PT Time Calculation (min) (ACUTE ONLY): 52 min   Charges:   PT Evaluation $PT Eval Moderate Complexity: 1 Mod PT Treatments $Therapeutic Activity: 23-37 mins        Abran Richard, PT Acute Rehab Services Pager [phone] [facility] [phone]    [name] 11/09/2019, 1:33 PM

## 2019-11-10 ENCOUNTER — Inpatient Hospital Stay (HOSPITAL_COMMUNITY): Payer: Medicare Other

## 2019-11-10 LAB — CBC WITH DIFFERENTIAL/PLATELET
Abs Immature Granulocytes: 1.49 10*3/uL — ABNORMAL HIGH (ref 0.00–0.07)
Basophils Absolute: 0 10*3/uL (ref 0.0–0.1)
Basophils Relative: 0 %
Eosinophils Absolute: 0 10*3/uL (ref 0.0–0.5)
Eosinophils Relative: 0 %
HCT: 45.9 % (ref 39.0–52.0)
Hemoglobin: 15.6 g/dL (ref 13.0–17.0)
Immature Granulocytes: 10 %
Lymphocytes Relative: 10 %
Lymphs Abs: 1.4 10*3/uL (ref 0.7–4.0)
MCH: 27.6 pg (ref 26.0–34.0)
MCHC: 34 g/dL (ref 30.0–36.0)
MCV: 81.1 fL (ref 80.0–100.0)
Monocytes Absolute: 1.3 10*3/uL — ABNORMAL HIGH (ref 0.1–1.0)
Monocytes Relative: 9 %
Neutro Abs: 10.3 10*3/uL — ABNORMAL HIGH (ref 1.7–7.7)
Neutrophils Relative %: 71 %
Platelets: 337 10*3/uL (ref 150–400)
RBC: 5.66 MIL/uL (ref 4.22–5.81)
RDW: 15.6 % — ABNORMAL HIGH (ref 11.5–15.5)
WBC: 14.6 10*3/uL — ABNORMAL HIGH (ref 4.0–10.5)
nRBC: 1.1 % — ABNORMAL HIGH (ref 0.0–0.2)

## 2019-11-10 LAB — GLUCOSE, CAPILLARY
Glucose-Capillary: 121 mg/dL — ABNORMAL HIGH (ref 70–99)
Glucose-Capillary: 120 mg/dL — ABNORMAL HIGH (ref 70–99)
Glucose-Capillary: 138 mg/dL — ABNORMAL HIGH (ref 70–99)
Glucose-Capillary: 134 mg/dL — ABNORMAL HIGH (ref 70–99)

## 2019-11-10 MED ORDER — ALUM & MAG HYDROXIDE-SIMETH 200-200-20 MG/5ML PO SUSP
15.0000 mL | ORAL | Status: DC | PRN
  Administered 2019-11-10: 15 mL via ORAL
  Filled 2019-11-10: qty 30

## 2019-11-10 NOTE — Progress Notes (Signed)
PROGRESS NOTE  [name]  DOB: October 30, 1950  PCP: Lucianne Lei, MD IWP:[id number]  DOA: 11/07/2019  LOS: 3 days   Chief Complaint  Patient presents with  . Respiratory Distress   Brief narrative: Patient is a 69 y.o.malewith PMH significant ofOSA on CPAP; CAD; HTN; HLD; DM; and COPD on 4L home, bedbound status. Patient presented to the ED on 6/23 with complaint of shortness of breath, cough, wheezing sweating and dysuria.  Associated with poor appetite.  Because of dyspnea, he was  requiring to use CPAP 24/7 for the duration.   In the ED, oxygen saturation was in 80s.  He was started on BiPAP. Initial blood gas 7.48/42/128/32 WBC count elevated to 27,000 Urinalysis with hazy amber-colored urine, few bacteria, small leukocytes Chest x-ray showed slight increase in mild left basilar airspace disease. EKG sinus tachycardia at 110 bpm, nonspecific ST-T wave changes with no evidence of acute ischemia.  Patient was given antibiotic, Solu-Medrol, magnesium and admitted to hospitalist service for further evaluation management.  11/10/2019: Patient seen alongside patient's nurse.  Available records reviewed.  Physical therapy and PT is appreciated.  Skilled nursing facility is recommended.  We will consult the social work team.  Updated patient.  Patient has continued to report heartburn.  There may be some gaseous distention.  We will get an abdominal KUB.  Will hold p.o. intake, except for medications until results of KUB is known.  Further management will depend on hospital course.  Respiratory wise, patient seems comfortable.  Patient reported some difficulties sleeping.  I advised need to be careful with sleeping medications due to respiratory issues.  Subjective: Heartburn. No shortness of breath. No chest pain.  Assessment/Plan: Acute on chronic respiratory failure  COPD exacerbation Community-acquired pneumonia -Presented with 3 to 4 days of worsening shortness of breath,  increasing oxygen dependence, cough and overall declining health.   -Chest x-ray showed left lower lobe infiltrate.  -Currently on Solu-Medrol 60 mg IV twice daily, taper down to 40 mg IV twice daily.  Complains of heartburn.  Start Protonix and as needed Mylanta. -Continue IV Rocephin, IV azithromycin. -Continue Dulera, Spiriva and albuterol as needed from home. -Initially required BiPAP.  Currently on nasal cannula at higher flow.  Wean down as tolerated.  Uses 4 L at home. 11/10/2019: Respiratory status is stable.  Continue current regimen.  Obstructive sleep apnea -Uses nightly CPAP at home.  Continue same in the hospital.  UTI -Presented with dysuria -Urinalysis with hazy amber-colored urine, few bacteria, small leukocytes -IV Rocephin should provide adequate coverage. -Follow-up urine culture report. 11/10/2019: Urine culture has not grown any organisms.  Right groin warts -Reports intermittent bleeding right groin worse for several years. -I ordered for an ambulatory referral to dermatology.  Type 2 diabetes mellitus - A1c 6.1. -Metformin on hold for now. -Sliding scale insulin with Accu-Cheks.  Hypertension -Continue Norvasc, Atenolol  CAD/HLD -Continue ASA, Plavix, pravastatin  Neuro psych meds -On Remeron, buspirone at home  Mobility: PT eval pending.  Nutritional status: Nutrition Problem: Inadequate oral intake Etiology: poor appetite Signs/Symptoms: per patient/family report There is no height or weight on file to calculate BMI. Diet:  Diet Order            Diet Carb Modified Fluid consistency: Thin; Room service appropriate? Yes  Diet effective ____                Code Status:   Code Status: Full Code  DVT prophylaxis:enoxaparin (LOVENOX) injection 40 mg Start: 11/07/19  1000  Antimicrobials:  IV Rocephin and IV azithromycin Fluid: Stop fluid today.  Infusions:  . azithromycin 500 mg (11/09/19 0819)  . cefTRIAXone (ROCEPHIN)  IV 2 g  (11/09/19 1051)   Consultants: None Family Communication: : called and updated patient's wife this morning. Status is: Inpatient  Remains inpatient appropriate because: On IV steroids, IV antibiotics, pending PT evaluation.  Dispo: The patient is from: Home              Anticipated d/c is to: Home              Anticipated d/c date is: 1 day.               Patient currently is not medically stable to d/c.   Scheduled Meds: . amLODipine  5 mg Oral QHS  . aspirin EC  81 mg Oral QHS  . atenolol  25 mg Oral QHS  . busPIRone  5 mg Oral BID  . clopidogrel  75 mg Oral QHS  . enoxaparin (LOVENOX) injection  40 mg Subcutaneous Q24H  . feeding supplement (ENSURE ENLIVE)  237 mL Oral TID BM  . insulin aspart  0-15 Units Subcutaneous TID WC  . insulin aspart  0-5 Units Subcutaneous QHS  . methylPREDNISolone (SOLU-MEDROL) injection  40 mg Intravenous Q12H  . mirtazapine  45 mg Oral QHS  . mometasone-formoterol  2 puff Inhalation BID  . multivitamin with minerals  1 tablet Oral Daily  . pantoprazole  40 mg Oral Daily  . pravastatin  40 mg Oral q1800  . sodium chloride flush  3 mL Intravenous Q12H  . umeclidinium bromide  1 puff Inhalation Daily    Antimicrobials: Anti-infectives (From admission, onward)   Start     Dose/Rate Route Frequency Ordered Stop   11/08/19 1000  cefTRIAXone (ROCEPHIN) 2 g in sodium chloride 0.9 % 100 mL IVPB     Discontinue     2 g 200 mL/hr over 30 Minutes Intravenous Every 24 hours 11/07/19 0919 11/13/19 0959   11/08/19 1000  azithromycin (ZITHROMAX) 500 mg in sodium chloride 0.9 % 250 mL IVPB     Discontinue     500 mg 250 mL/hr over 60 Minutes Intravenous Every 24 hours 11/07/19 0919 11/12/19 0959   11/07/19 0800  cefTRIAXone (ROCEPHIN) 1 g in sodium chloride 0.9 % 100 mL IVPB        1 g 200 mL/hr over 30 Minutes Intravenous  Once 11/07/19 0759 11/07/19 1036   11/07/19 0800  azithromycin (ZITHROMAX) 500 mg in sodium chloride 0.9 % 250 mL IVPB        500  mg 250 mL/hr over 60 Minutes Intravenous  Once 11/07/19 0759 11/07/19 1117      PRN meds: albuterol, alum & mag hydroxide-simeth, dextromethorphan-guaiFENesin   Objective: Vitals:   11/10/19 0736 11/10/19 0759  BP: (!) 152/102   Pulse: 80   Resp: 19   Temp: 98.5 F (36.9 C)   SpO2: 94% 94%    Intake/Output Summary (Last 24 hours) at 11/10/2019 9379 Last data filed at 11/09/2019 2123 Gross per 24 hour  Intake 3 ml  Output 750 ml  Net -747 ml   There were no vitals filed for this visit. Weight change:  There is no height or weight on file to calculate BMI.   Physical Exam: General exam: Appears calm and comfortable. Marland Kitchen HEENT: No pallor or jaundice. Neck: Supple.  No raised JVD.   Lungs: Clear to auscultation bilaterally.  No wheezing  CVS: Regular rate and rhythm, no murmur GI/Abd: Gaseously distended.  Decreased bowel sounds.   CNS: Alert, awake, oriented x3.  Patient was all extremities. Extremities: No pedal edema, no calf tenderness  Data Review: I have personally reviewed the laboratory data and studies available.  Recent Labs  Lab 11/07/19 0659 11/07/19 0711 11/08/19 0410 11/10/19 0448  WBC 27.2*  --  16.7* 14.6*  NEUTROABS  --   --   --  10.3*  HGB 16.7 17.3* 14.6 15.6  HCT 49.3 51.0 43.6 45.9  MCV 81.8  --  82.0 81.1  PLT 284  --  270 337   Recent Labs  Lab 11/07/19 0659 11/07/19 0711 11/08/19 0410  NA 145 146* 140  K 4.3 4.1 3.8  CL 106  --  99  CO2 26  --  27  GLUCOSE 153*  --  146*  BUN 17  --  19  CREATININE 1.00  --  0.88  CALCIUM 10.4*  --  9.1    Signed, Bonnell Public, MD [facility]  11/10/2019

## 2019-11-10 NOTE — Progress Notes (Signed)
Paged Dr Marcello Moores regarding pt b/p outside of ordered parameters, physician returned call with no new orders at this time, will cont to monitor.

## 2019-11-11 LAB — GLUCOSE, CAPILLARY
Glucose-Capillary: 100 mg/dL — ABNORMAL HIGH (ref 70–99)
Glucose-Capillary: 108 mg/dL — ABNORMAL HIGH (ref 70–99)
Glucose-Capillary: 117 mg/dL — ABNORMAL HIGH (ref 70–99)
Glucose-Capillary: 119 mg/dL — ABNORMAL HIGH (ref 70–99)
Glucose-Capillary: 120 mg/dL — ABNORMAL HIGH (ref 70–99)
Glucose-Capillary: 125 mg/dL — ABNORMAL HIGH (ref 70–99)
Glucose-Capillary: 98 mg/dL (ref 70–99)

## 2019-11-11 LAB — CBC WITH DIFFERENTIAL/PLATELET
Abs Immature Granulocytes: 0.94 10*3/uL — ABNORMAL HIGH (ref 0.00–0.07)
Basophils Absolute: 0.1 10*3/uL (ref 0.0–0.1)
Basophils Relative: 1 %
Eosinophils Absolute: 0 10*3/uL (ref 0.0–0.5)
Eosinophils Relative: 0 %
HCT: 47.5 % (ref 39.0–52.0)
Hemoglobin: 16.1 g/dL (ref 13.0–17.0)
Immature Granulocytes: 7 %
Lymphocytes Relative: 9 %
Lymphs Abs: 1.2 10*3/uL (ref 0.7–4.0)
MCH: 27.2 pg (ref 26.0–34.0)
MCHC: 33.9 g/dL (ref 30.0–36.0)
MCV: 80.4 fL (ref 80.0–100.0)
Monocytes Absolute: 0.9 10*3/uL (ref 0.1–1.0)
Monocytes Relative: 6 %
Neutro Abs: 11.4 10*3/uL — ABNORMAL HIGH (ref 1.7–7.7)
Neutrophils Relative %: 77 %
Platelets: 384 10*3/uL (ref 150–400)
RBC: 5.91 MIL/uL — ABNORMAL HIGH (ref 4.22–5.81)
RDW: 16 % — ABNORMAL HIGH (ref 11.5–15.5)
WBC: 14.5 10*3/uL — ABNORMAL HIGH (ref 4.0–10.5)
nRBC: 1 % — ABNORMAL HIGH (ref 0.0–0.2)

## 2019-11-11 LAB — RENAL FUNCTION PANEL
Albumin: 2.9 g/dL — ABNORMAL LOW (ref 3.5–5.0)
Anion gap: 11 (ref 5–15)
BUN: 17 mg/dL (ref 8–23)
CO2: 28 mmol/L (ref 22–32)
Calcium: 10.2 mg/dL (ref 8.9–10.3)
Chloride: 102 mmol/L (ref 98–111)
Creatinine, Ser: 0.99 mg/dL (ref 0.61–1.24)
GFR calc Af Amer: >60 mL/min (ref >60)
GFR calc non Af Amer: >60 mL/min (ref >60)
Glucose, Bld: 131 mg/dL — ABNORMAL HIGH (ref 70–99)
Phosphorus: 3.4 mg/dL (ref 2.5–4.6)
Potassium: 4.2 mmol/L (ref 3.5–5.1)
Sodium: 141 mmol/L (ref 135–145)

## 2019-11-11 LAB — MAGNESIUM: Magnesium: 2.1 mg/dL (ref 1.7–2.4)

## 2019-11-11 NOTE — Progress Notes (Signed)
Paged Dr Sidney Ace regarding pt NPO status and pt request for his medication, return call received from Dr Sidney Ace with new orders to be written. Pt to remain NPO at this time.

## 2019-11-11 NOTE — Progress Notes (Signed)
PROGRESS NOTE  [name]  DOB: 26-Mar-1951  PCP: Lucianne Lei, MD UXN:[id number]  DOA: 11/07/2019  LOS: 4 days   Chief Complaint  Patient presents with  . Respiratory Distress   Brief narrative: Patient is a 69 y.o.malewith PMH significant ofOSA on CPAP; CAD; HTN; HLD; DM; and COPD on 4L home, bedbound status.  Patient presented to the ED on 6/23 with complaint of shortness of breath, cough, wheezing sweating and dysuria.  Associated with poor appetite.  Because of dyspnea, he was  requiring to use CPAP 24/7 for the duration.  In the ED, oxygen saturation was in 80s.  He was started on BiPAP.  Initial blood gas 7.48/42/128/32.  WBC count elevated to 27,000 Urinalysis with hazy amber-colored urine, few bacteria, small leukocytes.  However, urine culture has not grown any organisms.  Chest x-ray showed slight increase in mild left basilar airspace disease.  EKG sinus tachycardia at 110 bpm, nonspecific ST-T wave changes with no evidence of acute ischemia.  Patient was given antibiotic, Solu-Medrol, magnesium and admitted to hospitalist service for further evaluation management.  11/10/2019: Patient seen alongside patient's nurse.  Available records reviewed.  Physical therapy and PT is appreciated.  Skilled nursing facility is recommended.  We will consult the social work team.  Updated patient.  Patient has continued to report heartburn.  There may be some gaseous distention.  We will get an abdominal KUB.  Will hold p.o. intake, except for medications until results of KUB is known.  Further management will depend on hospital course.  Respiratory wise, patient seems comfortable.  Patient reported some difficulties sleeping.  I advised need to be careful with sleeping medications due to respiratory issues.  11/11/2019: Abdominal KUB done on 11/10/2019 revealed significantly gaseously distended stomach.  There are concerns for possible gastric outlet obstruction.  Patient has been kept n.p.o.  except for medications, and NG tube inserted to low suction.  Repeat abdominal x-ray revealed resolution of the gaseous distention of the stomach.  GI team has been consulted.  Patient's abdominal pain has resolved.  No pulmonary symptoms endorsed today.  No other constitutional symptoms reported.  Subjective: No heartburn. No shortness of breath. No chest pain.  Assessment/Plan: Possible gastric outlet obstruction: -See above documentation on 11/11/2019. -Patient is currently n.p.o., except for medications. -NG tube is inserted to low suction -GI team has been consulted -Further management as directed by the GI team.  Significant colonic fecal burden (right-sided): -For soapsuds enema. -Laxatives when NG tube is removed.   Acute on chronic respiratory failure  COPD exacerbation Community-acquired pneumonia -Presented with 3 to 4 days of worsening shortness of breath, increasing oxygen dependence, cough and overall declining health.   -Chest x-ray showed left lower lobe infiltrate.  -Currently on Solu-Medrol 60 mg IV twice daily, taper down to 40 mg IV twice daily.  Complains of heartburn.  Start Protonix and as needed Mylanta. -Continue IV Rocephin, IV azithromycin. -Continue Dulera, Spiriva and albuterol as needed from home. -Initially required BiPAP.  Currently on nasal cannula at higher flow.  Wean down as tolerated.  Uses 4 L at home. 11/11/2019: Respiratory status is stable.  Continue current regimen.  Obstructive sleep apnea -Uses nightly CPAP at home.  Continue same in the hospital.  UTI -Presented with dysuria -Urinalysis with hazy amber-colored urine, few bacteria, small leukocytes -IV Rocephin should provide adequate coverage. -Follow-up urine culture report. 11/10/2019: Urine culture has not grown any organisms.  Right groin warts -Reports intermittent bleeding right groin worse  for several years. -I ordered for an ambulatory referral to dermatology.  Type 2  diabetes mellitus - A1c 6.1. -Metformin on hold for now. -Sliding scale insulin with Accu-Cheks.  Hypertension -Continue Norvasc, Atenolol  CAD/HLD -Continue ASA, Plavix, pravastatin  Neuro psych meds -On Remeron, buspirone at home  Mobility: PT eval pending.  Nutritional status: Nutrition Problem: Inadequate oral intake Etiology: poor appetite Signs/Symptoms: per patient/family report There is no height or weight on file to calculate BMI. Diet:  Diet Order            Diet NPO time specified Except for: Sips with Meds  Diet effective now                Code Status:   Code Status: Full Code  DVT prophylaxis:enoxaparin (LOVENOX) injection 40 mg Start: 11/07/19 1000  Antimicrobials:  IV Rocephin and IV azithromycin Fluid: Stop fluid today.  Infusions:  . cefTRIAXone (ROCEPHIN)  IV 2 g (11/11/19 1028)   Consultants: None Family Communication: : called and updated patient's wife this morning. Status is: Inpatient  Remains inpatient appropriate because: On IV steroids, IV antibiotics, pending PT evaluation.  Dispo: The patient is from: Home              Anticipated d/c is to: Home              Anticipated d/c date is: 1 day.               Patient currently is not medically stable to d/c.   Scheduled Meds: . amLODipine  5 mg Oral QHS  . aspirin EC  81 mg Oral QHS  . atenolol  25 mg Oral QHS  . busPIRone  5 mg Oral BID  . clopidogrel  75 mg Oral QHS  . enoxaparin (LOVENOX) injection  40 mg Subcutaneous Q24H  . feeding supplement (ENSURE ENLIVE)  237 mL Oral TID BM  . insulin aspart  0-15 Units Subcutaneous TID WC  . insulin aspart  0-5 Units Subcutaneous QHS  . methylPREDNISolone (SOLU-MEDROL) injection  40 mg Intravenous Q12H  . mirtazapine  45 mg Oral QHS  . mometasone-formoterol  2 puff Inhalation BID  . multivitamin with minerals  1 tablet Oral Daily  . pantoprazole  40 mg Oral Daily  . pravastatin  40 mg Oral q1800  . sodium chloride flush  3 mL  Intravenous Q12H  . umeclidinium bromide  1 puff Inhalation Daily    Antimicrobials: Anti-infectives (From admission, onward)   Start     Dose/Rate Route Frequency Ordered Stop   11/08/19 1000  cefTRIAXone (ROCEPHIN) 2 g in sodium chloride 0.9 % 100 mL IVPB     Discontinue     2 g 200 mL/hr over 30 Minutes Intravenous Every 24 hours 11/07/19 0919 11/13/19 0959   11/08/19 1000  azithromycin (ZITHROMAX) 500 mg in sodium chloride 0.9 % 250 mL IVPB        500 mg 250 mL/hr over 60 Minutes Intravenous Every 24 hours 11/07/19 0919 11/11/19 1253   11/07/19 0800  cefTRIAXone (ROCEPHIN) 1 g in sodium chloride 0.9 % 100 mL IVPB        1 g 200 mL/hr over 30 Minutes Intravenous  Once 11/07/19 0759 11/07/19 1036   11/07/19 0800  azithromycin (ZITHROMAX) 500 mg in sodium chloride 0.9 % 250 mL IVPB        500 mg 250 mL/hr over 60 Minutes Intravenous  Once 11/07/19 0759 11/07/19 1117  PRN meds: albuterol, alum & mag hydroxide-simeth, dextromethorphan-guaiFENesin   Objective: Vitals:   11/11/19 1220 11/11/19 1300  BP: (!) 141/101   Pulse:    Resp: (!) 27 (!) 29  Temp: 98.7 F (37.1 C)   SpO2: 98% 98%    Intake/Output Summary (Last 24 hours) at 11/11/2019 1630 Last data filed at 11/11/2019 1600 Gross per 24 hour  Intake 3 ml  Output 6150 ml  Net -6147 ml   There were no vitals filed for this visit. Weight change:  There is no height or weight on file to calculate BMI.   Physical Exam: General exam: Appears calm and comfortable.  NG tube to low suction. HEENT: No pallor or jaundice. Neck: Supple.  No raised JVD.   Lungs: Clear to auscultation bilaterally.  No wheezing  CVS: Regular rate and rhythm, no murmur GI/Abd: Abdominal distention has resolved with NG tube placement.  Decreased bowel sounds.   CNS: Alert, awake, oriented x3.  Patient moves all extremities. Extremities: No pedal edema, no calf tenderness  Data Review: I have personally reviewed the laboratory data and  studies available.  Recent Labs  Lab 11/07/19 0659 11/07/19 0711 11/08/19 0410 11/10/19 0448 11/11/19 0234  WBC 27.2*  --  16.7* 14.6* 14.5*  NEUTROABS  --   --   --  10.3* 11.4*  HGB 16.7 17.3* 14.6 15.6 16.1  HCT 49.3 51.0 43.6 45.9 47.5  MCV 81.8  --  82.0 81.1 80.4  PLT 284  --  270 337 384   Recent Labs  Lab 11/07/19 0659 11/07/19 0711 11/08/19 0410 11/11/19 0234  NA 145 146* 140 141  K 4.3 4.1 3.8 4.2  CL 106  --  99 102  CO2 26  --  27 28  GLUCOSE 153*  --  146* 131*  BUN 17  --  19 17  CREATININE 1.00  --  0.88 0.99  CALCIUM 10.4*  --  9.1 10.2  MG  --   --   --  2.1  PHOS  --   --   --  3.4    Signed, Bonnell Public, MD [facility]  11/11/2019

## 2019-11-11 NOTE — Anesthesia Preprocedure Evaluation (Addendum)
Anesthesia Evaluation  Patient identified by MRN, date of birth, ID band Patient awake    Reviewed: Allergy & Precautions, NPO status , Patient's Chart, lab work & pertinent test results  Airway Mallampati: II  TM Distance: >3 FB Neck ROM: Full    Dental no notable dental hx. (+) Teeth Intact   Pulmonary sleep apnea and Continuous Positive Airway Pressure Ventilation , COPD, Current Smoker,    Pulmonary exam normal breath sounds clear to auscultation       Cardiovascular hypertension, + CAD  Normal cardiovascular exam Rhythm:Regular Rate:Normal     Neuro/Psych    GI/Hepatic Neg liver ROS, GERD  ,  Endo/Other  diabetes  Renal/GU negative Renal ROS     Musculoskeletal   Abdominal   Peds  Hematology   Anesthesia Other Findings   Reproductive/Obstetrics                           Anesthesia Physical Anesthesia Plan  ASA: III  Anesthesia Plan: MAC   Post-op Pain Management:    Induction: Intravenous  PONV Risk Score and Plan: Treatment may vary due to age or medical condition  Airway Management Planned: Nasal Cannula and Natural Airway  Additional Equipment: None  Intra-op Plan:   Post-operative Plan:   Informed Consent: I have reviewed the patients History and Physical, chart, labs and discussed the procedure including the risks, benefits and alternatives for the proposed anesthesia with the patient or authorized representative who has indicated his/her understanding and acceptance.     Dental advisory given  Plan Discussed with:   Anesthesia Plan Comments:         Anesthesia Quick Evaluation

## 2019-11-11 NOTE — H&P (View-Only) (Signed)
Reason for Consult: Abdominal distension Referring Physician: [facility]  Quadir Clementeen Graham HPI: This is a 69 year old male with a PMH of COPD on oxygen, CAD, DM, GERD, and OSA originally admitted for complaints of SOB.  His initial saturations upon admission was in the 80's, but he is improved.  Over this time course he complains about having abdominal pain and distension.  A portable KUB yesterday shows a moderate distension of his gastric lumen.  An NG tube was subsequently placed.  His last colonoscopy was on 07/08/2017 and it was a follow up for his personal history of polyps.  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  His colonoscopy on 08/30/2014 was positive for 3 small adenomas.  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  Past Medical History:  Diagnosis Date  . Anxiety   . Arthritis   . COPD (chronic obstructive pulmonary disease) ([place])   . Coronary artery disease    x4  . Depression   . Diabetes mellitus without complication ([place])   . GERD (gastroesophageal reflux disease)    tums will relieve  . HLD (hyperlipidemia)   . HTN (hypertension)   . Obstructive sleep apnea    sleep study 2006, uses CPAP, pt does not know cpap settings  . On home oxygen therapy    2L via nasal cannula prn  . Shortness of breath    exertion    Past Surgical History:  Procedure Laterality Date  . CARDIAC CATHETERIZATION  2003,2010   x4   . COLONOSCOPY WITH PROPOFOL N/A 08/30/2014   Procedure: COLONOSCOPY WITH PROPOFOL;  Surgeon: Carol Ada, MD;  Location: [facility];  Service: Endoscopy;  Laterality: N/A;  h&p in file   . COLONOSCOPY WITH PROPOFOL N/A 07/08/2017   Procedure: COLONOSCOPY WITH PROPOFOL;  Surgeon: Carol Ada, MD;  Location: [facility];  Service: Endoscopy;  Laterality: N/A;  . CORONARY ANGIOPLASTY     stents x 4  . DENTAL SURGERY    . TOTAL HIP ARTHROPLASTY  04/06/2012   Procedure: TOTAL HIP ARTHROPLASTY;  Surgeon: Sharmon Revere, MD;  Location: [facility];  Service: Orthopedics;  Laterality: Right;  . TOTAL HIP ARTHROPLASTY Left 04/03/2013   Procedure: LEFT TOTAL HIP ARTHROPLASTY;  Surgeon: Sharmon Revere, MD;  Location: [facility];  Service: Orthopedics;  Laterality: Left;    Family History  Problem Relation Age of Onset  . Depression Mother   . Heart disease Father   . Emphysema Father        smoked    Social History:  reports that he has been smoking cigarettes and e-cigarettes. He has a 11.25 pack-year smoking history. He has never used smokeless tobacco. He reports current drug use. Drug: Marijuana. He reports that he does not drink alcohol.  Allergies: No Known Allergies  Medications:  Scheduled: . amLODipine  5 mg Oral QHS  . aspirin EC  81 mg Oral QHS  . atenolol  25 mg Oral QHS  . busPIRone  5 mg Oral BID  . clopidogrel  75 mg Oral QHS  . enoxaparin (LOVENOX) injection  40 mg Subcutaneous Q24H  . feeding supplement (ENSURE ENLIVE)  237 mL Oral TID BM  . insulin aspart  0-15 Units Subcutaneous TID WC  . insulin aspart  0-5 Units Subcutaneous QHS  . methylPREDNISolone (SOLU-MEDROL) injection  40 mg Intravenous Q12H  . mirtazapine  45 mg Oral QHS  . mometasone-formoterol  2 puff Inhalation BID  . multivitamin  with minerals  1 tablet Oral Daily  . pantoprazole  40 mg Oral Daily  . pravastatin  40 mg Oral q1800  . sodium chloride flush  3 mL Intravenous Q12H  . umeclidinium bromide  1 puff Inhalation Daily   Continuous: . azithromycin 500 mg (11/10/19 1007)  . cefTRIAXone (ROCEPHIN)  IV 2 g (11/11/19 1028)    Results for orders placed or performed during the hospital encounter of 11/07/19 (from the past 24 hour(s))  Glucose, capillary     Status: Abnormal   Collection Time: 11/10/19 12:33 PM  Result Value Ref Range   Glucose-Capillary 120 (H) 70 - 99 mg/dL  Glucose, capillary     Status: Abnormal   Collection Time: 11/10/19  5:31 PM  Result Value Ref Range   Glucose-Capillary 138 (H) 70 -  99 mg/dL  Glucose, capillary     Status: Abnormal   Collection Time: 11/10/19  8:58 PM  Result Value Ref Range   Glucose-Capillary 134 (H) 70 - 99 mg/dL  Glucose, capillary     Status: Abnormal   Collection Time: 11/11/19 12:21 AM  Result Value Ref Range   Glucose-Capillary 125 (H) 70 - 99 mg/dL  Magnesium     Status: None   Collection Time: 11/11/19  2:34 AM  Result Value Ref Range   Magnesium 2.1 1.7 - 2.4 mg/dL  CBC with Differential/Platelet     Status: Abnormal   Collection Time: 11/11/19  2:34 AM  Result Value Ref Range   WBC 14.5 (H) 4.0 - 10.5 K/uL   RBC 5.91 (H) 4.22 - 5.81 MIL/uL   Hemoglobin 16.1 13.0 - 17.0 g/dL   HCT 47.5 39 - 52 %   MCV 80.4 80.0 - 100.0 fL   MCH 27.2 26.0 - 34.0 pg   MCHC 33.9 30.0 - 36.0 g/dL   RDW 16.0 (H) 11.5 - 15.5 %   Platelets 384 150 - 400 K/uL   nRBC 1.0 (H) 0.0 - 0.2 %   Neutrophils Relative % 77 %   Neutro Abs 11.4 (H) 1.7 - 7.7 K/uL   Lymphocytes Relative 9 %   Lymphs Abs 1.2 0.7 - 4.0 K/uL   Monocytes Relative 6 %   Monocytes Absolute 0.9 0 - 1 K/uL   Eosinophils Relative 0 %   Eosinophils Absolute 0.0 0 - 0 K/uL   Basophils Relative 1 %   Basophils Absolute 0.1 0 - 0 K/uL   Immature Granulocytes 7 %   Abs Immature Granulocytes 0.94 (H) 0.00 - 0.07 K/uL   Target Cells PRESENT   Renal function panel     Status: Abnormal   Collection Time: 11/11/19  2:34 AM  Result Value Ref Range   Sodium 141 135 - 145 mmol/L   Potassium 4.2 3.5 - 5.1 mmol/L   Chloride 102 98 - 111 mmol/L   CO2 28 22 - 32 mmol/L   Glucose, Bld 131 (H) 70 - 99 mg/dL   BUN 17 8 - 23 mg/dL   Creatinine, Ser 0.99 0.61 - 1.24 mg/dL   Calcium 10.2 8.9 - 10.3 mg/dL   Phosphorus 3.4 2.5 - 4.6 mg/dL   Albumin 2.9 (L) 3.5 - 5.0 g/dL   GFR calc non Af Amer >60 >60 mL/min   GFR calc Af Amer >60 >60 mL/min   Anion gap 11 5 - 15  Glucose, capillary     Status: Abnormal   Collection Time: 11/11/19  5:29 AM  Result Value Ref Range   Glucose-Capillary 120 (  H) 70 -  99 mg/dL  Glucose, capillary     Status: Abnormal   Collection Time: 11/11/19  8:53 AM  Result Value Ref Range   Glucose-Capillary 100 (H) 70 - 99 mg/dL     DG Abd 1 View  Result Date: 11/10/2019 CLINICAL DATA:  Abdominal distension with epigastric pain. EXAM: ABDOMEN - 1 VIEW COMPARISON:  None. FINDINGS: Moderate gaseous distention of the stomach. This may be the result of a degree of gastric outlet obstruction. Remainder of the bowel gas pattern is nonobstructive with mild fecal retention over the right colon. No free peritoneal air. Mild degenerative change of the spine. Bilateral hip arthroplasties. IMPRESSION: Moderate gastric distention of the stomach which could be due to a degree of gastric outlet obstruction. Electronically Signed   By: Marin Olp M.D.   On: 11/10/2019 12:31   DG Abd Portable 1V  Result Date: 11/10/2019 CLINICAL DATA:  Heartburn. EXAM: PORTABLE ABDOMEN - 1 VIEW COMPARISON:  11/10/2019 FINDINGS: Interval placement of a nasogastric tube without the previously demonstrated gaseous distention of the stomach. Normal bowel gas pattern. Prominent stool in the right colon. Left hip prosthesis. IMPRESSION: 1. Nasogastric tube in the stomach. 2. Prominent stool in the right colon. Electronically Signed   By: Claudie Revering M.D.   On: 11/10/2019 18:38    ROS:  As stated above in the HPI otherwise negative.  Blood pressure (!) 134/95, pulse 89, temperature 97.7 F (36.5 C), temperature source Oral, resp. rate (!) 24, SpO2 94 %.    PE: Gen: NAD, Alert and Oriented HEENT:  [place]/AT, EOMI Neck: Supple, no LAD Lungs: CTA Bilaterally CV: RRR without M/G/R ABD: Soft, NTND, +BS Ext: No C/C/E  Assessment/Plan: 1) Gastric distension. 2) COPD. 3) OSA. 4) CAD.  Examination of the patient's suction canister shows that there is approximately 1 liter of fluid in the container.  He does feel much better after being decompressed.  Prior to this hospitalization he does not report any  issue with upper GI symptoms.  An EGD will be performed for further evaluation.  Plan: 1) EGD tomorrow.  Rahn Lacuesta D 11/11/2019, 11:36 AM

## 2019-11-11 NOTE — Consult Note (Addendum)
Reason for Consult: Abdominal distension Referring Physician: [facility]  [name] HPI: This is a 69 year old male with a PMH of COPD on oxygen, CAD, DM, GERD, and OSA originally admitted for complaints of SOB.  His initial saturations upon admission was in the 80's, but he is improved.  Over this time course he complains about having abdominal pain and distension.  A portable KUB yesterday shows a moderate distension of his gastric lumen.  An NG tube was subsequently placed.  His last colonoscopy was on 07/08/2017 and it was a follow up for his personal history of polyps.  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  His colonoscopy on 08/30/2014 was positive for 3 small adenomas.  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  Past Medical History:  Diagnosis Date  . Anxiety   . Arthritis   . COPD (chronic obstructive pulmonary disease) ([place])   . Coronary artery disease    x4  . Depression   . Diabetes mellitus without complication ([place])   . GERD (gastroesophageal reflux disease)    tums will relieve  . HLD (hyperlipidemia)   . HTN (hypertension)   . Obstructive sleep apnea    sleep study 2006, uses CPAP, pt does not know cpap settings  . On home oxygen therapy    2L via nasal cannula prn  . Shortness of breath    exertion    Past Surgical History:  Procedure Laterality Date  . CARDIAC CATHETERIZATION  2003,2010   x4   . COLONOSCOPY WITH PROPOFOL N/A 08/30/2014   Procedure: COLONOSCOPY WITH PROPOFOL;  Surgeon: Carol Ada, MD;  Location: [facility];  Service: Endoscopy;  Laterality: N/A;  h&p in file   . COLONOSCOPY WITH PROPOFOL N/A 07/08/2017   Procedure: COLONOSCOPY WITH PROPOFOL;  Surgeon: Carol Ada, MD;  [facility];  Service: Endoscopy;  Laterality: N/A;  . CORONARY ANGIOPLASTY     stents x 4  . DENTAL SURGERY    . TOTAL HIP ARTHROPLASTY  04/06/2012   Procedure: TOTAL HIP ARTHROPLASTY;  Surgeon: Sharmon Revere, MD;  Location: [facility];  Service: Orthopedics;  Laterality: Right;  . TOTAL HIP ARTHROPLASTY Left 04/03/2013   Procedure: LEFT TOTAL HIP ARTHROPLASTY;  Surgeon: Sharmon Revere, MD;  Location: [facility];  Service: Orthopedics;  Laterality: Left;    Family History  Problem Relation Age of Onset  . Depression Mother   . Heart disease Father   . Emphysema Father        smoked    Social History:  reports that he has been smoking cigarettes and e-cigarettes. He has a 11.25 pack-year smoking history. He has never used smokeless tobacco. He reports current drug use. Drug: Marijuana. He reports that he does not drink alcohol.  Allergies: No Known Allergies  Medications:  Scheduled: . amLODipine  5 mg Oral QHS  . aspirin EC  81 mg Oral QHS  . atenolol  25 mg Oral QHS  . busPIRone  5 mg Oral BID  . clopidogrel  75 mg Oral QHS  . enoxaparin (LOVENOX) injection  40 mg Subcutaneous Q24H  . feeding supplement (ENSURE ENLIVE)  237 mL Oral TID BM  . insulin aspart  0-15 Units Subcutaneous TID WC  . insulin aspart  0-5 Units Subcutaneous QHS  . methylPREDNISolone (SOLU-MEDROL) injection  40 mg Intravenous Q12H  . mirtazapine  45 mg Oral QHS  . mometasone-formoterol  2 puff Inhalation BID  . multivitamin  with minerals  1 tablet Oral Daily  . pantoprazole  40 mg Oral Daily  . pravastatin  40 mg Oral q1800  . sodium chloride flush  3 mL Intravenous Q12H  . umeclidinium bromide  1 puff Inhalation Daily   Continuous: . azithromycin 500 mg (11/10/19 1007)  . cefTRIAXone (ROCEPHIN)  IV 2 g (11/11/19 1028)    Results for orders placed or performed during the hospital encounter of 11/07/19 (from the past 24 hour(s))  Glucose, capillary     Status: Abnormal   Collection Time: 11/10/19 12:33 PM  Result Value Ref Range   Glucose-Capillary 120 (H) 70 - 99 mg/dL  Glucose, capillary     Status: Abnormal   Collection Time: 11/10/19  5:31 PM  Result Value Ref Range   Glucose-Capillary 138 (H) 70 -  99 mg/dL  Glucose, capillary     Status: Abnormal   Collection Time: 11/10/19  8:58 PM  Result Value Ref Range   Glucose-Capillary 134 (H) 70 - 99 mg/dL  Glucose, capillary     Status: Abnormal   Collection Time: 11/11/19 12:21 AM  Result Value Ref Range   Glucose-Capillary 125 (H) 70 - 99 mg/dL  Magnesium     Status: None   Collection Time: 11/11/19  2:34 AM  Result Value Ref Range   Magnesium 2.1 1.7 - 2.4 mg/dL  CBC with Differential/Platelet     Status: Abnormal   Collection Time: 11/11/19  2:34 AM  Result Value Ref Range   WBC 14.5 (H) 4.0 - 10.5 K/uL   RBC 5.91 (H) 4.22 - 5.81 MIL/uL   Hemoglobin 16.1 13.0 - 17.0 g/dL   HCT 47.5 39 - 52 %   MCV 80.4 80.0 - 100.0 fL   MCH 27.2 26.0 - 34.0 pg   MCHC 33.9 30.0 - 36.0 g/dL   RDW 16.0 (H) 11.5 - 15.5 %   Platelets 384 150 - 400 K/uL   nRBC 1.0 (H) 0.0 - 0.2 %   Neutrophils Relative % 77 %   Neutro Abs 11.4 (H) 1.7 - 7.7 K/uL   Lymphocytes Relative 9 %   Lymphs Abs 1.2 0.7 - 4.0 K/uL   Monocytes Relative 6 %   Monocytes Absolute 0.9 0 - 1 K/uL   Eosinophils Relative 0 %   Eosinophils Absolute 0.0 0 - 0 K/uL   Basophils Relative 1 %   Basophils Absolute 0.1 0 - 0 K/uL   Immature Granulocytes 7 %   Abs Immature Granulocytes 0.94 (H) 0.00 - 0.07 K/uL   Target Cells PRESENT   Renal function panel     Status: Abnormal   Collection Time: 11/11/19  2:34 AM  Result Value Ref Range   Sodium 141 135 - 145 mmol/L   Potassium 4.2 3.5 - 5.1 mmol/L   Chloride 102 98 - 111 mmol/L   CO2 28 22 - 32 mmol/L   Glucose, Bld 131 (H) 70 - 99 mg/dL   BUN 17 8 - 23 mg/dL   Creatinine, Ser 0.99 0.61 - 1.24 mg/dL   Calcium 10.2 8.9 - 10.3 mg/dL   Phosphorus 3.4 2.5 - 4.6 mg/dL   Albumin 2.9 (L) 3.5 - 5.0 g/dL   GFR calc non Af Amer >60 >60 mL/min   GFR calc Af Amer >60 >60 mL/min   Anion gap 11 5 - 15  Glucose, capillary     Status: Abnormal   Collection Time: 11/11/19  5:29 AM  Result Value Ref Range   Glucose-Capillary 120 (  H) 70 -  99 mg/dL  Glucose, capillary     Status: Abnormal   Collection Time: 11/11/19  8:53 AM  Result Value Ref Range   Glucose-Capillary 100 (H) 70 - 99 mg/dL     DG Abd 1 View  Result Date: 11/10/2019 CLINICAL DATA:  Abdominal distension with epigastric pain. EXAM: ABDOMEN - 1 VIEW COMPARISON:  None. FINDINGS: Moderate gaseous distention of the stomach. This may be the result of a degree of gastric outlet obstruction. Remainder of the bowel gas pattern is nonobstructive with mild fecal retention over the right colon. No free peritoneal air. Mild degenerative change of the spine. Bilateral hip arthroplasties. IMPRESSION: Moderate gastric distention of the stomach which could be due to a degree of gastric outlet obstruction. Electronically Signed   By: Marin Olp M.[name].   On: 11/10/2019 12:31   DG Abd Portable 1V  Result Date: 11/10/2019 CLINICAL DATA:  Heartburn. EXAM: PORTABLE ABDOMEN - 1 VIEW COMPARISON:  11/10/2019 FINDINGS: Interval placement of a nasogastric tube without the previously demonstrated gaseous distention of the stomach. Normal bowel gas pattern. Prominent stool in the right colon. Left hip prosthesis. IMPRESSION: 1. Nasogastric tube in the stomach. 2. Prominent stool in the right colon. Electronically Signed   By: Claudie Revering M.[name].   On: 11/10/2019 18:38    ROS:  As stated above in the HPI otherwise negative.  Blood pressure (!) 134/95, pulse 89, temperature 97.7 F (36.5 C), temperature source Oral, resp. rate (!) 24, SpO2 94 %.    PE: Gen: NAD, Alert and Oriented HEENT:  [place]/AT, EOMI Neck: Supple, no LAD Lungs: CTA Bilaterally CV: RRR without M/G/R ABD: Soft, NTND, +BS Ext: No C/C/E  Assessment/Plan: 1) Gastric distension. 2) COPD. 3) OSA. 4) CAD.  Examination of the patient's suction canister shows that there is approximately 1 liter of fluid in the container.  He does feel much better after being decompressed.  Prior to this hospitalization he does not report any  issue with upper GI symptoms.  An EGD will be performed for further evaluation.  Plan: 1) EGD tomorrow.  [name] 11/11/2019, 11:36 AM

## 2019-11-11 NOTE — Progress Notes (Signed)
QHS cpap is not being worn at this time due to NG

## 2019-11-11 NOTE — Progress Notes (Signed)
Cpap not worn due to NG

## 2019-11-12 ENCOUNTER — Encounter (HOSPITAL_COMMUNITY)
Admission: EM | Disposition: A | Discharge status: Skilled Nursing Facility | Payer: Self-pay | Source: Home / Self Care | Attending: Internal Medicine

## 2019-11-12 ENCOUNTER — Inpatient Hospital Stay (HOSPITAL_COMMUNITY): Payer: Medicare Other | Admitting: Anesthesiology

## 2019-11-12 ENCOUNTER — Encounter (HOSPITAL_COMMUNITY): Payer: Self-pay | Admitting: Internal Medicine

## 2019-11-12 HISTORY — PX: ESOPHAGOGASTRODUODENOSCOPY (EGD) WITH PROPOFOL: SHX5813

## 2019-11-12 LAB — GLUCOSE, CAPILLARY
Glucose-Capillary: 123 mg/dL — ABNORMAL HIGH (ref 70–99)
Glucose-Capillary: 101 mg/dL — ABNORMAL HIGH (ref 70–99)
Glucose-Capillary: 119 mg/dL — ABNORMAL HIGH (ref 70–99)
Glucose-Capillary: 120 mg/dL — ABNORMAL HIGH (ref 70–99)
Glucose-Capillary: 152 mg/dL — ABNORMAL HIGH (ref 70–99)

## 2019-11-12 SURGERY — ESOPHAGOGASTRODUODENOSCOPY (EGD) WITH PROPOFOL
Anesthesia: Monitor Anesthesia Care

## 2019-11-12 MED ORDER — POLYETHYLENE GLYCOL 3350 17 G PO PACK
17.0000 g | PACK | Freq: Every day | ORAL | Status: DC
  Administered 2019-11-12 – 2019-11-14 (×3): 17 g via ORAL
  Filled 2019-11-12 (×4): qty 1

## 2019-11-12 MED ORDER — SODIUM CHLORIDE 0.9 % IV SOLN: INTRAVENOUS | Status: DC

## 2019-11-12 MED ORDER — PROPOFOL 10 MG/ML IV BOLUS
INTRAVENOUS | Status: DC | PRN
  Administered 2019-11-12 (×2): 20 mg via INTRAVENOUS

## 2019-11-12 MED ORDER — PROPOFOL 500 MG/50ML IV EMUL
INTRAVENOUS | Status: DC | PRN
  Administered 2019-11-12: 100 ug/kg/min via INTRAVENOUS

## 2019-11-12 MED ORDER — LACTATED RINGERS IV SOLN
INTRAVENOUS | Status: AC | PRN
  Administered 2019-11-12: 1000 mL via INTRAVENOUS

## 2019-11-12 MED ORDER — ATENOLOL 25 MG PO TABS
25.0000 mg | ORAL_TABLET | Freq: Every day | ORAL | Status: DC
  Administered 2019-11-12 – 2019-11-15 (×4): 25 mg via ORAL
  Filled 2019-11-12 (×3): qty 1

## 2019-11-12 MED ORDER — DOCUSATE SODIUM 100 MG PO CAPS
100.0000 mg | ORAL_CAPSULE | Freq: Two times a day (BID) | ORAL | Status: DC
  Administered 2019-11-12 – 2019-11-15 (×6): 100 mg via ORAL
  Filled 2019-11-12 (×6): qty 1

## 2019-11-12 MED ORDER — ACETAMINOPHEN 325 MG PO TABS
650.0000 mg | ORAL_TABLET | Freq: Four times a day (QID) | ORAL | Status: DC | PRN
  Administered 2019-11-12 – 2019-11-14 (×3): 650 mg via ORAL
  Filled 2019-11-12 (×3): qty 2

## 2019-11-12 MED ORDER — METOCLOPRAMIDE HCL 5 MG PO TABS
5.0000 mg | ORAL_TABLET | Freq: Three times a day (TID) | ORAL | Status: DC
  Administered 2019-11-12 – 2019-11-14 (×6): 5 mg via ORAL
  Filled 2019-11-12 (×6): qty 1

## 2019-11-12 MED ORDER — PREDNISONE 20 MG PO TABS
60.0000 mg | ORAL_TABLET | Freq: Every day | ORAL | Status: DC
  Administered 2019-11-13 – 2019-11-14 (×2): 60 mg via ORAL
  Filled 2019-11-12 (×2): qty 3

## 2019-11-12 MED ORDER — LEVALBUTEROL HCL 0.63 MG/3ML IN NEBU: 0.6300 mg | INHALATION_SOLUTION | Freq: Four times a day (QID) | RESPIRATORY_TRACT | Status: DC | PRN

## 2019-11-12 SURGICAL SUPPLY — 15 items

## 2019-11-12 NOTE — Progress Notes (Signed)
PROGRESS NOTE  [name]  DOB: 1950/12/26  PCP: Lucianne Lei, MD DGL:[id number]  DOA: 11/07/2019  LOS: 5 days   Chief Complaint  Patient presents with  . Respiratory Distress   Brief narrative: Patient is a 69 y.o.malewith PMH significant ofOSA on CPAP; CAD; HTN; HLD; DM; and COPD on 4L home, bedbound status.  Patient presented to the ED on 6/23 with complaint of shortness of breath, cough, wheezing sweating and dysuria.  Associated with poor appetite.  Because of dyspnea, he was  requiring to use CPAP 24/7 for the duration.  In the ED, oxygen saturation was in 80s.  He was started on BiPAP.  Initial blood gas 7.48/42/128/32.  WBC count elevated to 27,000 Urinalysis with hazy amber-colored urine, few bacteria, small leukocytes.  However, urine culture has not grown any organisms.  Chest x-ray showed slight increase in mild left basilar airspace disease.  EKG sinus tachycardia at 110 bpm, nonspecific ST-T wave changes with no evidence of acute ischemia.  Patient was given antibiotic, Solu-Medrol, magnesium and admitted to hospitalist service for further evaluation management.  11/10/2019: Patient seen alongside patient's nurse.  Available records reviewed.  Physical therapy and PT is appreciated.  Skilled nursing facility is recommended.  We will consult the social work team.  Updated patient.  Patient has continued to report heartburn.  There may be some gaseous distention.  We will get an abdominal KUB.  Will hold p.o. intake, except for medications until results of KUB is known.  Further management will depend on hospital course.  Respiratory wise, patient seems comfortable.  Patient reported some difficulties sleeping.  I advised need to be careful with sleeping medications due to respiratory issues.  11/11/2019: Abdominal KUB done on 11/10/2019 revealed significantly gaseously distended stomach.  There are concerns for possible gastric outlet obstruction.  Patient has been kept n.p.o.  except for medications, and NG tube inserted to low suction.  Repeat abdominal x-ray revealed resolution of the gaseous distention of the stomach.  GI team has been consulted.  Patient's abdominal pain has resolved.  No pulmonary symptoms endorsed today.  No other constitutional symptoms reported.   11/12/2019: Patient underwent EGD.  EGD revealed "LA Grade D reflux esophagitis with no bleeding.  4 cm hiatal hernia".  No gastric outlet obstruction noted.  Will start patient on Reglan.  Protonix is already prescribed.  Patient developed significant tachycardia, heart rate of 120 bpm.  Sinus tachycardia.  Apparently, patient's atenolol has been on hold.  Patient was volume resuscitated with 500 mils of normal saline.  Atenolol has been restarted.  Heart rate is back to normal.  Hopefully, patient will be discharged tomorrow.  Subjective: No heartburn. No shortness of breath. No chest pain.  Assessment/Plan: Possible gastric outlet obstruction ruled out: -Patient underwent EGD today.   -EGD revealed esophageal gastritis.   -Start patient on Reglan (possible component of diabetic gastroparesis).   -Protonix ordered already.   -Further management depend on hospital course.   Sinus tachycardia: -Suspect multifactorial. -Patient is on nebs DuoNeb. -Atenolol has been on hold. -Patient has been volume resuscitated. -Nebs DuoNeb is on hold. -Atenolol has been restarted. -Heart rate is back down to normal.  Significant colonic fecal burden (right-sided): -Patient underwent soapsuds enema yesterday.  -Start laxatives.     Acute on chronic respiratory failure  COPD exacerbation Community-acquired pneumonia -Presented with 3 to 4 days of worsening shortness of breath, increasing oxygen dependence, cough and overall declining health.   -Chest x-ray showed left lower  lobe infiltrate.  -Currently on Solu-Medrol 60 mg IV twice daily, taper down to 40 mg IV twice daily.  Complains of heartburn.  Start  Protonix and as needed Mylanta. -Continue IV Rocephin, IV azithromycin. -Continue Dulera, Spiriva and albuterol as needed from home. -Initially required BiPAP.  Currently on nasal cannula at higher flow.  Wean down as tolerated.  Uses 4 L at home. 11/11/2019: Respiratory status is stable.  Continue current regimen. 11/12/2019: Hold as needed nebs DuoNeb due to sinus tachycardia.  Start nebs Xopenex 0.63 mg every 6 hours as needed.  Obstructive sleep apnea -Uses nightly CPAP at home.  Continue same in the hospital.  UTI -Presented with dysuria -Urinalysis with hazy amber-colored urine, few bacteria, small leukocytes -IV Rocephin should provide adequate coverage. -Follow-up urine culture report. 11/10/2019: Urine culture has not grown any organisms.  Right groin warts -Reports intermittent bleeding right groin worse for several years. -I ordered for an ambulatory referral to dermatology.  Type 2 diabetes mellitus - A1c 6.1. -Metformin on hold for now. -Sliding scale insulin with Accu-Cheks.  Hypertension -Continue Norvasc, Atenolol  CAD/HLD -Continue ASA, Plavix, pravastatin  Neuro psych meds -On Remeron, buspirone at home  Mobility: PT eval pending.  Nutritional status: Nutrition Problem: Inadequate oral intake Etiology: poor appetite Signs/Symptoms: per patient/family report Body mass index is 22.96 kg/m. Diet:  Diet Order            Diet clear liquid Room service appropriate? Yes; Fluid consistency: Thin  Diet effective now                Code Status:   Code Status: Full Code  DVT prophylaxis:enoxaparin (LOVENOX) injection 40 mg Start: 11/07/19 1000  Antimicrobials:  IV Rocephin and IV azithromycin Fluid: Stop fluid today.  Infusions:   Consultants: None Family Communication: : called and updated patient's wife this morning. Status is: Inpatient  Remains inpatient appropriate because: On IV steroids, IV antibiotics, pending PT evaluation.  Dispo:  The patient is from: Home              Anticipated d/c is to: Home              Anticipated d/c date is: 1 day.               Patient currently is not medically stable to d/c.   Scheduled Meds: . amLODipine  5 mg Oral QHS  . aspirin EC  81 mg Oral QHS  . atenolol  25 mg Oral QHS  . atenolol  25 mg Oral Daily  . busPIRone  5 mg Oral BID  . clopidogrel  75 mg Oral QHS  . enoxaparin (LOVENOX) injection  40 mg Subcutaneous Q24H  . feeding supplement (ENSURE ENLIVE)  237 mL Oral TID BM  . insulin aspart  0-15 Units Subcutaneous TID WC  . insulin aspart  0-5 Units Subcutaneous QHS  . methylPREDNISolone (SOLU-MEDROL) injection  40 mg Intravenous Q12H  . metoCLOPramide  5 mg Oral TID AC  . mirtazapine  45 mg Oral QHS  . mometasone-formoterol  2 puff Inhalation BID  . multivitamin with minerals  1 tablet Oral Daily  . pantoprazole  40 mg Oral Daily  . pravastatin  40 mg Oral q1800  . sodium chloride flush  3 mL Intravenous Q12H  . umeclidinium bromide  1 puff Inhalation Daily    Antimicrobials: Anti-infectives (From admission, onward)   Start     Dose/Rate Route Frequency Ordered Stop   11/08/19 1000  cefTRIAXone (ROCEPHIN) 2 g in sodium chloride 0.9 % 100 mL IVPB        2 g 200 mL/hr over 30 Minutes Intravenous Every 24 hours 11/07/19 0919 11/12/19 1126   11/08/19 1000  azithromycin (ZITHROMAX) 500 mg in sodium chloride 0.9 % 250 mL IVPB        500 mg 250 mL/hr over 60 Minutes Intravenous Every 24 hours 11/07/19 0919 11/11/19 1253   11/07/19 0800  cefTRIAXone (ROCEPHIN) 1 g in sodium chloride 0.9 % 100 mL IVPB        1 g 200 mL/hr over 30 Minutes Intravenous  Once 11/07/19 0759 11/07/19 1036   11/07/19 0800  azithromycin (ZITHROMAX) 500 mg in sodium chloride 0.9 % 250 mL IVPB        500 mg 250 mL/hr over 60 Minutes Intravenous  Once 11/07/19 0759 11/07/19 1117      PRN meds: acetaminophen, alum & mag hydroxide-simeth, dextromethorphan-guaiFENesin   Objective: Vitals:    11/12/19 1317 11/12/19 1428  BP: (!) 125/95 122/83  Pulse: 90 79  Resp: 19   Temp: 98.4 F (36.9 C) 97.9 F (36.6 C)  SpO2: 95% 95%    Intake/Output Summary (Last 24 hours) at 11/12/2019 1648 Last data filed at 11/12/2019 0743 Gross per 24 hour  Intake 150 ml  Output 3675 ml  Net -3525 ml   Filed Weights   11/12/19 0701  Weight: 72.6 kg   Weight change:  Body mass index is 22.96 kg/m.   Physical Exam: General exam: Appears calm and comfortable.  NG tube to low suction. HEENT: No pallor or jaundice. Neck: Supple.  No raised JVD.   Lungs: Clear to auscultation bilaterally.  No wheezing  CVS: S1-S2, sinus tachycardia.   GI/Abd: Abdominal distention has resolved.  Decreased bowel sounds.   CNS: Alert, awake, oriented x3.  Patient moves all extremities. Extremities: No pedal edema, no calf tenderness  Data Review: I have personally reviewed the laboratory data and studies available.  Recent Labs  Lab 11/07/19 0659 11/07/19 0711 11/08/19 0410 11/10/19 0448 11/11/19 0234  WBC 27.2*  --  16.7* 14.6* 14.5*  NEUTROABS  --   --   --  10.3* 11.4*  HGB 16.7 17.3* 14.6 15.6 16.1  HCT 49.3 51.0 43.6 45.9 47.5  MCV 81.8  --  82.0 81.1 80.4  PLT 284  --  270 337 384   Recent Labs  Lab 11/07/19 0659 11/07/19 0711 11/08/19 0410 11/11/19 0234  NA 145 146* 140 141  K 4.3 4.1 3.8 4.2  CL 106  --  99 102  CO2 26  --  27 28  GLUCOSE 153*  --  146* 131*  BUN 17  --  19 17  CREATININE 1.00  --  0.88 0.99  CALCIUM 10.4*  --  9.1 10.2  MG  --   --   --  2.1  PHOS  --   --   --  3.4    Signed, Bonnell Public, MD [facility]  11/12/2019

## 2019-11-12 NOTE — Op Note (Signed)
[facility] Patient Name: [name] Procedure Date : 11/12/2019 MRN: [id number] Attending MD: Carol Ada , MD Date of Birth: 19-Nov-1950 CSN: [id number] Age: 69 Admit Type: Inpatient Procedure:                Upper GI endoscopy Indications:              Abnormal abdominal x-ray of the GI tract Providers:                Carol Ada, MD, Elspeth Cho Tech., Technician,                            Rejeana Brock, CRNA, Carlyn Reichert, RN Referring MD:              Medicines:                Propofol per Anesthesia Complications:            No immediate complications. Estimated Blood Loss:     Estimated blood loss: none. Procedure:                Pre-Anesthesia Assessment:                           - Prior to the procedure, a History and Physical                            was performed, and patient medications and                            allergies were reviewed. [name] tolerance of                            previous anesthesia was also reviewed. The risks                            and benefits of the procedure and the sedation                            options and risks were discussed with the patient.                            All questions were answered, and informed consent                            was obtained. Prior Anticoagulants: The patient has                            taken no previous anticoagulant or antiplatelet                            agents. ASA Grade Assessment: III - A patient with                            severe systemic disease. After reviewing the risks  and benefits, the patient was deemed in                            satisfactory condition to undergo the procedure.                           - Sedation was administered by an anesthesia                            professional. Deep sedation was attained.                           After obtaining informed consent, the endoscope was                             passed under direct vision. Throughout the                            procedure, the patient's blood pressure, pulse, and                            oxygen saturations were monitored continuously. The                            GIF-H190 ([id number]) Olympus gastroscope was                            introduced through the mouth, and advanced to the                            second part of duodenum. The upper GI endoscopy was                            accomplished without difficulty. The patient                            tolerated the procedure well. Scope In: Scope Out: Findings:      LA Grade D (one or more mucosal breaks involving at least 75% of       esophageal circumference) esophagitis with no bleeding was found in the       lower third of the esophagus.      A 4 cm hiatal hernia was present.      The stomach was normal.      The examined duodenum was normal.      There was no evidence of any mechanical GOO in the antrum, pylorus, or       the proximal duodenum. An LA Grade D esophagitis was found, which was       exacerbated with the NG tube. Impression:               - LA Grade D reflux esophagitis with no bleeding.                           - 4 cm hiatal hernia.                           -  Normal stomach.                           - Normal examined duodenum.                           - No specimens collected. Recommendation:           - Return patient to hospital ward for ongoing care.                           - Clear liquid diet for today and then advance as                            tolerated.                           - Continue present medications - Pantoprazole 40 mg                            QD. Procedure Code(s):        --- Professional ---                           [phone], Esophagogastroduodenoscopy, flexible,                            transoral; diagnostic, including collection of                            specimen(s) by brushing or washing, when performed                             (separate procedure) Diagnosis Code(s):        --- Professional ---                           K21.00, Gastro-esophageal reflux disease with                            esophagitis, without bleeding                           K44.9, Diaphragmatic hernia without obstruction or                            gangrene                           R93.3, Abnormal findings on diagnostic imaging of                            other parts of digestive tract CPT copyright 2019 [employer]. All rights reserved. The codes documented in this report are preliminary and upon coder review may  be revised to meet current compliance requirements. Carol Ada, MD Carol Ada, MD 11/12/2019 7:50:14 AM This report has been signed electronically. Number of Addenda: 0

## 2019-11-12 NOTE — Anesthesia Procedure Notes (Signed)
Procedure Name: MAC Date/Time: 11/12/2019 7:31 AM Performed by: Inda Coke, CRNA Pre-anesthesia Checklist: Patient identified, Emergency Drugs available, Suction available, Timeout performed and Patient being monitored Patient Re-evaluated:Patient Re-evaluated prior to induction Oxygen Delivery Method: Nasal cannula Induction Type: IV induction Dental Injury: Teeth and Oropharynx as per pre-operative assessment

## 2019-11-12 NOTE — Anesthesia Postprocedure Evaluation (Signed)
Anesthesia Post Note  Patient: [name]  Procedure(s) Performed: ESOPHAGOGASTRODUODENOSCOPY (EGD) WITH PROPOFOL (N/A )     Patient location during evaluation: Endoscopy Anesthesia Type: MAC Level of consciousness: awake and alert Pain management: pain level controlled Vital Signs Assessment: post-procedure vital signs reviewed and stable Respiratory status: spontaneous breathing, nonlabored ventilation, respiratory function stable and patient connected to nasal cannula oxygen Cardiovascular status: blood pressure returned to baseline and stable Postop Assessment: no apparent nausea or vomiting Anesthetic complications: no   No complications documented.  Last Vitals:  Vitals:   11/12/19 0800 11/12/19 0810  BP: 107/88 121/90  Pulse: (!) 111 (!) 103  Resp: (!) 37 (!) 35  Temp:    SpO2: 93% 96%    Last Pain:  Vitals:   11/12/19 0810  TempSrc:   PainSc: 0-No pain                 Barnet Glasgow

## 2019-11-12 NOTE — Transfer of Care (Signed)
Immediate Anesthesia Transfer of Care Note  Patient: [name]  Procedure(s) Performed: ESOPHAGOGASTRODUODENOSCOPY (EGD) WITH PROPOFOL (N/A )  Patient Location: PACU and Endoscopy Unit  Anesthesia Type:MAC  Level of Consciousness: awake and drowsy  Airway & Oxygen Therapy: Patient Spontanous Breathing and Patient connected to nasal cannula oxygen  Post-op Assessment: Report given to RN and Post -op Vital signs reviewed and stable  Post vital signs: Reviewed and stable  Last Vitals:  Vitals Value Taken Time  BP 98/61 11/12/19 0749  Temp    Pulse 111 11/12/19 0752  Resp 37 11/12/19 0752  SpO2 93 % 11/12/19 0752  Vitals shown include unvalidated device data.  Last Pain:  Vitals:   11/12/19 0701  TempSrc: Oral  PainSc: 0-No pain         Complications: No complications documented.

## 2019-11-12 NOTE — Progress Notes (Signed)
Pt left floor at this time for procedure in Gastro, accompanied by two attendants, NGT clamped and consent placed on chart.

## 2019-11-12 NOTE — Progress Notes (Signed)
PT Cancellation Note  Patient Details Name: [name] MRN: [id number] DOB: 07/29/1950   Cancelled Treatment:    Reason Eval/Treat Not Completed: Medical issues which prohibited therapy;Fatigue/lethargy limiting ability to participate - Pt fatigued from procedure this morning, had a tachycardic and tachypneic event post-procedure and per pt "my doctor told me to rest today". PT to check back tomorrow per pt request.  Marisa Cyphers, [place] Pager [phone]  Office [place] 11/12/2019, 3:00 PM

## 2019-11-12 NOTE — Progress Notes (Signed)
   11/12/19 1317  Vitals  Temp 98.4 F (36.9 C)  Temp Source Oral  BP (!) 125/95  MAP (mmHg) 105  BP Location Right Arm  BP Method Automatic  Pulse Rate 90  ECG Heart Rate 87  Resp 19  Level of Consciousness  Level of Consciousness Alert  Oxygen Therapy  SpO2 95 %  O2 Device Nasal Cannula  MEWS Score  MEWS Temp 0  MEWS Systolic 0  MEWS Pulse 0  MEWS RR 0  MEWS LOC 0  MEWS Score 0  MEWS Score Color Green  Patients HR improved after beta blocker administered

## 2019-11-12 NOTE — Progress Notes (Signed)
°   11/12/19 1133  Assess: MEWS Score  Temp (!) 97.4 F (36.3 C)  BP 104/84  Pulse Rate (!) 124  ECG Heart Rate (!) 121  Resp (!) 27  Level of Consciousness Alert  SpO2 94 %  O2 Device Nasal Cannula  O2 Flow Rate (L/min) 4 L/min  Assess: MEWS Score  MEWS Temp 0  MEWS Systolic 0  MEWS Pulse 2  MEWS RR 2  MEWS LOC 0  MEWS Score 4  MEWS Score Color Red  Assess: if the MEWS score is Yellow or Red  Were vital signs taken at a resting state? Yes  Focused Assessment Documented focused assessment  Early Detection of Sepsis Score *See Row Information* Medium  MEWS guidelines implemented *See Row Information* No, altered LOC is baseline  Treat  MEWS Interventions Administered prn meds/treatments  Take Vital Signs  Increase Vital Sign Frequency  Red: Q 1hr X 4 then Q 4hr X 4, if remains red, continue Q 4hrs  Escalate  MEWS: Escalate Red: discuss with charge nurse/RN and provider, consider discussing with RRT  Notify: Charge Nurse/RN  Name of Charge Nurse/RN Notified  Veterinary surgeon)  Date Charge Nurse/RN Notified 11/12/19  Time Charge Nurse/RN Notified 1137  Notify: Provider  Provider Name/Title  Marthenia Rolling)  Date Provider Notified 11/12/19  Time Provider Notified 1136  Notification Type Page  Notification Reason Other (Comment)  Response  (Red MEWS and HR)  Date of Provider Response 11/12/19  Time of Provider Response 1143  Document  Patient Outcome Stabilized after interventions  Dr. Shellia Carwin patients beta blocker.

## 2019-11-12 NOTE — Interval H&P Note (Signed)
History and Physical Interval Note:  11/12/2019 7:13 AM  [name]  has presented today for surgery, with the diagnosis of Gastric outlet obstruction.  The various methods of treatment have been discussed with the patient and family. After consideration of risks, benefits and other options for treatment, the patient has consented to  Procedure(s): ESOPHAGOGASTRODUODENOSCOPY (EGD) WITH PROPOFOL (N/A) as a surgical intervention.  [name] history has been reviewed, patient examined, no change in status, stable for surgery.  I have reviewed the patient's chart and labs.  Questions were answered to the patient's satisfaction.     [name]

## 2019-11-12 NOTE — TOC Progression Note (Signed)
Transition of Care [facility]) - Progression Note    Patient Details  Name: OTHON GUARDIA MRN: [id number] Date of Birth: [birth year]  Transition of Care [facility]) CM/SW [place], [place] Phone Number: 11/12/2019, 4:25 PM  Clinical Narrative:   Referral for home health given to [facility], they accepted. Will follow for home health PT.     Expected Discharge Plan: [place] Barriers to Discharge: Continued Medical Work up  Expected Discharge Plan and Services Expected Discharge Plan: [place] arrangements for the past 2 months: Single Family Home                           HH Arranged: PT [place]: [place] Date [place]: 11/12/19   Representative spoke with at [place]: [place] ([place]) Interventions    Readmission Risk Interventions No flowsheet data found.

## 2019-11-13 LAB — GLUCOSE, CAPILLARY
Glucose-Capillary: 106 mg/dL — ABNORMAL HIGH (ref 70–99)
Glucose-Capillary: 164 mg/dL — ABNORMAL HIGH (ref 70–99)
Glucose-Capillary: 116 mg/dL — ABNORMAL HIGH (ref 70–99)
Glucose-Capillary: 214 mg/dL — ABNORMAL HIGH (ref 70–99)

## 2019-11-13 NOTE — TOC Progression Note (Signed)
Transition of Care [facility]) - Progression Note    Patient Details  Name: [name] MRN: [id number] Date of Birth: 09/01/1950  Transition of Care [facility]) CM/SW [place], [place] Phone Number: 11/13/2019, 10:04 AM  Clinical Narrative:     CSW met with pt after RN notified CSW that pt wants SNF. Met with pt and pt wife at bedside. CSW explained SNF process. Pt is wanting SNF and has no preference other than facilities in [place] area. Pt agreeable to csw faxing out bed requests.   FL2 completed and bed requests sent. PASSR still pending  Expected Discharge Plan: [place] Barriers to Discharge: Continued Medical Work up  Expected Discharge Plan and Services Expected Discharge Plan: [place] arrangements for the past 2 months: Single Family Home                           HH Arranged: PT [place]: [place] Date [place]: 11/12/19   Representative spoke with at [place]: [place] ([place]) Interventions    Readmission Risk Interventions No flowsheet data found.

## 2019-11-13 NOTE — Progress Notes (Signed)
Pt placed on CPAP for night time rest 

## 2019-11-13 NOTE — Progress Notes (Signed)
PROGRESS NOTE  [name]  DOB: 02-Jan-1951  PCP: Lucianne Lei, MD OEV:[id number]  DOA: 11/07/2019  LOS: 6 days   Chief Complaint  Patient presents with  . Respiratory Distress   Brief narrative: Patient is a 69 y.o.malewith PMH significant ofOSA on CPAP; CAD; HTN; HLD; DM; and COPD on 4L home, bedbound status.  Patient presented to the ED on 6/23 with complaint of shortness of breath, cough, wheezing sweating and dysuria.  Associated with poor appetite.  Because of dyspnea, he was  requiring to use CPAP 24/7 for the duration.  In the ED, oxygen saturation was in 80s.  He was started on BiPAP.  Initial blood gas 7.48/42/128/32.  WBC count elevated to 27,000 Urinalysis with hazy amber-colored urine, few bacteria, small leukocytes.  However, urine culture has not grown any organisms.  Chest x-ray showed slight increase in mild left basilar airspace disease.  EKG sinus tachycardia at 110 bpm, nonspecific ST-T wave changes with no evidence of acute ischemia.  Patient was given antibiotic, Solu-Medrol, magnesium and admitted to hospitalist service for further evaluation management.  11/10/2019: Patient seen alongside patient's nurse.  Available records reviewed.  Physical therapy and PT is appreciated.  Skilled nursing facility is recommended.  We will consult the social work team.  Updated patient.  Patient has continued to report heartburn.  There may be some gaseous distention.  We will get an abdominal KUB.  Will hold p.o. intake, except for medications until results of KUB is known.  Further management will depend on hospital course.  Respiratory wise, patient seems comfortable.  Patient reported some difficulties sleeping.  I advised need to be careful with sleeping medications due to respiratory issues.  11/11/2019: Abdominal KUB done on 11/10/2019 revealed significantly gaseously distended stomach.  There are concerns for possible gastric outlet obstruction.  Patient has been kept n.p.o.  except for medications, and NG tube inserted to low suction.  Repeat abdominal x-ray revealed resolution of the gaseous distention of the stomach.  GI team has been consulted.  Patient's abdominal pain has resolved.  No pulmonary symptoms endorsed today.  No other constitutional symptoms reported.   11/12/2019: Patient underwent EGD.  EGD revealed "LA Grade D reflux esophagitis with no bleeding.  4 cm hiatal hernia".  No gastric outlet obstruction noted.  Will start patient on Reglan.  Protonix is already prescribed.  Patient developed significant tachycardia, heart rate of 120 bpm.  Sinus tachycardia.  Apparently, patient's atenolol has been on hold.  Patient was volume resuscitated with 500 mils of normal saline.  Atenolol has been restarted.  Heart rate is back to normal.  Hopefully, patient will be discharged tomorrow.  11/13/2019: Patient seen alongside patient's wife.  No new complaints.  Tachycardia has resolved.  Gaseous gastric distention has resolved.  No pulmonary issues.  Patient is stable for discharge.  Patient is awaiting discharge to skilled nursing facility with subacute rehabilitation.  Covid testing has been signed.  Patient will be discharged once a bed is available.  Hopefully, patient may be discharged tomorrow.  Subjective: No shortness of breath. No chest pain.  Assessment/Plan: Possible gastric outlet obstruction ruled out: -Patient underwent EGD.   -EGD revealed esophageal gastritis.   -Started patient on Reglan (possible component of diabetic gastroparesis).   -Protonix ordered already.   -Further management depend on hospital course.   Sinus tachycardia: -Resolved. -Suspect patient has baseline chronic sinus tachycardia for which he has been on low-dose atenolol. -Sinus tachycardia has resolved with resumption of atenolol. -  Nebs DuoNeb discontinued.   -As needed Xopenex nebs prescribed.  Significant colonic fecal burden (right-sided): -Patient had soapsuds enema.    -Continue laxatives.       Acute on chronic respiratory failure (acute component resolved): Possible COPD exacerbation: Possible community-acquired pneumonia: -Presented with 3 to 4 days of worsening shortness of breath, increasing oxygen dependence, cough and overall declining health.   -Chest x-ray showed left lower lobe infiltrate.  -Currently on Solu-Medrol 60 mg IV twice daily, taper down to 40 mg IV twice daily.  Complains of heartburn.  Start Protonix and as needed Mylanta. -Completed course of IV Rocephin, IV azithromycin. -Continue Dulera, Spiriva and albuterol as needed from home. -Initially required BiPAP.  Currently on nasal cannula. -Respiratory status is currently stable.  IV Solu-Medrol has been changed to oral prednisone.  Wean off prednisone on discharge.  Continue as needed Xopenex.   Obstructive sleep apnea -Uses nightly CPAP at home.  Continue same in the hospital.  UTI -Presented with dysuria -Urinalysis with hazy amber-colored urine, few bacteria, small leukocytes -IV Rocephin should provide adequate coverage. -Urine culture did not grow any organisms.    Right groin warts -Reports intermittent bleeding right groin worse for several years. -Follow with dermatology on discharge.    Type 2 diabetes mellitus - A1c 6.1. -Metformin on hold for now. -Sliding scale insulin with Accu-Cheks.  Hypertension -Continue Norvasc, Atenolol  CAD/HLD -Continue ASA, Plavix, pravastatin  Neuro psych meds -On Remeron, buspirone at home  Mobility: PT eval pending.  Nutritional status: Nutrition Problem: Inadequate oral intake Etiology: poor appetite Signs/Symptoms: per patient/family report Body mass index is 22.96 kg/m. Diet:  Diet Order            Diet regular Room service appropriate? Yes; Fluid consistency: Thin  Diet effective now                Code Status:   Code Status: Full Code  DVT prophylaxis:enoxaparin (LOVENOX) injection 40 mg Start:  11/07/19 1000  Antimicrobials:  IV Rocephin and IV azithromycin Fluid: Stop fluid today.  Infusions:   Consultants: None Family Communication: : called and updated patient's wife this morning. Status is: Inpatient  Remains inpatient appropriate because: On IV steroids, IV antibiotics, pending PT evaluation.  Dispo: The patient is from: Home              Anticipated d/c is to:  Skilled nursing facility              Anticipated d/c date is: 1 day.               Patient currently  is medically stable   Scheduled Meds: . amLODipine  5 mg Oral QHS  . aspirin EC  81 mg Oral QHS  . atenolol  25 mg Oral QHS  . atenolol  25 mg Oral Daily  . busPIRone  5 mg Oral BID  . clopidogrel  75 mg Oral QHS  . docusate sodium  100 mg Oral BID  . enoxaparin (LOVENOX) injection  40 mg Subcutaneous Q24H  . feeding supplement (ENSURE ENLIVE)  237 mL Oral TID BM  . insulin aspart  0-15 Units Subcutaneous TID WC  . insulin aspart  0-5 Units Subcutaneous QHS  . metoCLOPramide  5 mg Oral TID AC  . mirtazapine  45 mg Oral QHS  . mometasone-formoterol  2 puff Inhalation BID  . multivitamin with minerals  1 tablet Oral Daily  . pantoprazole  40 mg Oral Daily  . polyethylene  glycol  17 g Oral Daily  . pravastatin  40 mg Oral q1800  . predniSONE  60 mg Oral Q breakfast  . sodium chloride flush  3 mL Intravenous Q12H  . umeclidinium bromide  1 puff Inhalation Daily    Antimicrobials: Anti-infectives (From admission, onward)   Start     Dose/Rate Route Frequency Ordered Stop   11/08/19 1000  cefTRIAXone (ROCEPHIN) 2 g in sodium chloride 0.9 % 100 mL IVPB        2 g 200 mL/hr over 30 Minutes Intravenous Every 24 hours 11/07/19 0919 11/12/19 1126   11/08/19 1000  azithromycin (ZITHROMAX) 500 mg in sodium chloride 0.9 % 250 mL IVPB        500 mg 250 mL/hr over 60 Minutes Intravenous Every 24 hours 11/07/19 0919 11/11/19 1253   11/07/19 0800  cefTRIAXone (ROCEPHIN) 1 g in sodium chloride 0.9 % 100 mL  IVPB        1 g 200 mL/hr over 30 Minutes Intravenous  Once 11/07/19 0759 11/07/19 1036   11/07/19 0800  azithromycin (ZITHROMAX) 500 mg in sodium chloride 0.9 % 250 mL IVPB        500 mg 250 mL/hr over 60 Minutes Intravenous  Once 11/07/19 0759 11/07/19 1117      PRN meds: acetaminophen, alum & mag hydroxide-simeth, dextromethorphan-guaiFENesin, levalbuterol   Objective: Vitals:   11/13/19 0810 11/13/19 1138  BP:  (!) 122/92  Pulse:  79  Resp:  (!) 25  Temp:  97.6 F (36.4 C)  SpO2: 90% 95%    Intake/Output Summary (Last 24 hours) at 11/13/2019 1202 Last data filed at 11/13/2019 0600 Gross per 24 hour  Intake 240 ml  Output 1200 ml  Net -960 ml   Filed Weights   11/12/19 0701  Weight: 72.6 kg   Weight change:  Body mass index is 22.96 kg/m.   Physical Exam: General exam: Appears calm and comfortable.  NG tube to low suction. HEENT: No pallor or jaundice. Neck: Supple.  No raised JVD.   Lungs: Clear to auscultation bilaterally.  No wheezing  CVS: S1-S2, sinus tachycardia.   GI/Abd: Abdominal distention has resolved.  Decreased bowel sounds.   CNS: Alert, awake, oriented x3.  Patient moves all extremities. Extremities: No pedal edema, no calf tenderness  Data Review: I have personally reviewed the laboratory data and studies available.  Recent Labs  Lab 11/07/19 0659 11/07/19 0711 11/08/19 0410 11/10/19 0448 11/11/19 0234  WBC 27.2*  --  16.7* 14.6* 14.5*  NEUTROABS  --   --   --  10.3* 11.4*  HGB 16.7 17.3* 14.6 15.6 16.1  HCT 49.3 51.0 43.6 45.9 47.5  MCV 81.8  --  82.0 81.1 80.4  PLT 284  --  270 337 384   Recent Labs  Lab 11/07/19 0659 11/07/19 0711 11/08/19 0410 11/11/19 0234  NA 145 146* 140 141  K 4.3 4.1 3.8 4.2  CL 106  --  99 102  CO2 26  --  27 28  GLUCOSE 153*  --  146* 131*  BUN 17  --  19 17  CREATININE 1.00  --  0.88 0.99  CALCIUM 10.4*  --  9.1 10.2  MG  --   --   --  2.1  PHOS  --   --   --  3.4    Signed, Bonnell Public, MD [facility]  11/13/2019

## 2019-11-13 NOTE — NC FL2 (Signed)
[place] MEDICAID FL2 LEVEL OF CARE SCREENING TOOL     IDENTIFICATION  Patient Name: [name] Birthdate: 1950-12-31 Sex: male Admission Date (Current Location): 11/07/2019  [facility] and [place] Number:  Herbalist and Address:  [facility] [place] [place] [address] [place], [place] [address]      Provider Number: [id number]  Attending Physician Name and Address:  Bonnell Public, MD  Relative Name and Phone Number:  Decorian, Schuenemann (Spouse) [phone] (Mobile)    Current Level of Care: SNF Recommended Level of Care: [place] Prior Approval Number:    Date Approved/Denied:   PASRR Number:    Discharge Plan: SNF    Current Diagnoses: Patient Active Problem List   Diagnosis Date Noted  . Warts 11/09/2019  . Acute on chronic respiratory failure with hypoxia ([place]) 11/07/2019  . Diabetes mellitus without complication ([place])   . Coronary artery disease   . HLD (hyperlipidemia)   . HTN (hypertension)   . Obstructive sleep apnea   . Elevated prostate specific antigen (PSA) 02/01/2018  . COPD exacerbation ([place]) 11/11/2015  . Smoker 05/02/2013  . COPD GOLD III with reversibility  05/01/2013  . Chronic respiratory failure with hypoxia ([place]) 05/01/2013  . Depression 06/30/2011    Orientation RESPIRATION BLADDER Height & Weight     Self, Time, Situation, Place  O2, Other (Comment) (NC6l and CPAP) External catheter Weight: 160 lb (72.6 kg) Height:  5\' 10"  (177.8 cm)  BEHAVIORAL SYMPTOMS/MOOD NEUROLOGICAL BOWEL NUTRITION STATUS   (none)  (none) Continent Diet (see discharge summary)  AMBULATORY STATUS COMMUNICATION OF NEEDS Skin   Extensive Assist Verbally Normal                       Personal Care Assistance Level of Assistance  Feeding, Bathing, Dressing Bathing Assistance: Limited assistance Feeding assistance: Independent Dressing Assistance: Limited assistance     Functional Limitations Info  Sight,  Hearing, Speech Sight Info: Adequate Hearing Info: Adequate Speech Info: Adequate    SPECIAL CARE FACTORS FREQUENCY  PT (By licensed PT), OT (By licensed OT), Diabetic urine testing   Diabetic Urine Testing Frequency: insulin aspart (novoLOG) injection 0-15 Units, insulin aspart (novoLOG) injection 0-5 Units PT Frequency: 5x/week OT Frequency: 5x/week            Contractures Contractures Info: Not present    Additional Factors Info  Code Status, Allergies, Insulin Sliding Scale Code Status Info: Full Allergies Info: no known allergies   Insulin Sliding Scale Info: insulin aspart (novoLOG) injection 0-15 Units, insulin aspart (novoLOG) injection 0-5 Units       Current Medications (11/13/2019):  This is the current hospital active medication list Current Facility-Administered Medications  Medication Dose Route Frequency Provider Last Rate Last Admin  . acetaminophen (TYLENOL) tablet 650 mg  650 mg Oral Q6H PRN Dana Allan I, MD   650 mg at 11/12/19 1810  . alum & mag hydroxide-simeth (MAALOX/MYLANTA) 200-200-20 MG/5ML suspension 15 mL  15 mL Oral Q4H PRN Carol Ada, MD   15 mL at 11/10/19 1006  . amLODipine (NORVASC) tablet 5 mg  5 mg Oral QHS Carol Ada, MD   5 mg at 11/12/19 2123  . aspirin EC tablet 81 mg  81 mg Oral QHS Carol Ada, MD   81 mg at 11/12/19 2125  . atenolol (TENORMIN) tablet 25 mg  25 mg Oral QHS Carol Ada, MD   25 mg at 11/12/19 2122  . atenolol (TENORMIN)  tablet 25 mg  25 mg Oral Daily Dana Allan I, MD   25 mg at 11/12/19 1229  . busPIRone (BUSPAR) tablet 5 mg  5 mg Oral BID Carol Ada, MD   5 mg at 11/12/19 2123  . clopidogrel (PLAVIX) tablet 75 mg  75 mg Oral QHS Carol Ada, MD   75 mg at 11/12/19 2124  . dextromethorphan-guaiFENesin (MUCINEX DM) 30-600 MG per 12 hr tablet 1 tablet  1 tablet Oral BID PRN Carol Ada, MD   1 tablet at 11/10/19 0145  . docusate sodium (COLACE) capsule 100 mg  100 mg Oral BID Dana Allan  I, MD   100 mg at 11/12/19 2124  . enoxaparin (LOVENOX) injection 40 mg  40 mg Subcutaneous Q24H Carol Ada, MD   40 mg at 11/12/19 0844  . feeding supplement (ENSURE ENLIVE) (ENSURE ENLIVE) liquid 237 mL  237 mL Oral TID BM Carol Ada, MD   237 mL at 11/12/19 1940  . insulin aspart (novoLOG) injection 0-15 Units  0-15 Units Subcutaneous TID WC Carol Ada, MD   2 Units at 11/10/19 0800  . insulin aspart (novoLOG) injection 0-5 Units  0-5 Units Subcutaneous QHS Carol Ada, MD      . levalbuterol Penne Lash) nebulizer solution 0.63 mg  0.63 mg Nebulization Q6H PRN Dana Allan I, MD      . metoCLOPramide (REGLAN) tablet 5 mg  5 mg Oral TID AC Dana Allan I, MD   5 mg at 11/13/19 0608  . mirtazapine (REMERON) tablet 45 mg  45 mg Oral QHS Carol Ada, MD   45 mg at 11/12/19 2122  . mometasone-formoterol (DULERA) 200-5 MCG/ACT inhaler 2 puff  2 puff Inhalation BID Carol Ada, MD   2 puff at 11/13/19 0810  . multivitamin with minerals tablet 1 tablet  1 tablet Oral Daily Carol Ada, MD   1 tablet at 11/12/19 [phone]  . pantoprazole (PROTONIX) EC tablet 40 mg  40 mg Oral Daily Carol Ada, MD   40 mg at 11/12/19 0837  . polyethylene glycol (MIRALAX / GLYCOLAX) packet 17 g  17 g Oral Daily Dana Allan I, MD   17 g at 11/12/19 1939  . pravastatin (PRAVACHOL) tablet 40 mg  40 mg Oral q1800 Carol Ada, MD   40 mg at 11/12/19 1652  . predniSONE (DELTASONE) tablet 60 mg  60 mg Oral Q breakfast Dana Allan I, MD   60 mg at 11/13/19 8638  . sodium chloride flush (NS) 0.9 % injection 3 mL  3 mL Intravenous Q12H Carol Ada, MD   3 mL at 11/12/19 2125  . umeclidinium bromide (INCRUSE ELLIPTA) 62.5 MCG/INH 1 puff  1 puff Inhalation Daily Carol Ada, MD   1 puff at 11/13/19 0810     Discharge Medications: Please see discharge summary for a list of discharge medications.  Relevant Imaging Results:  Relevant Lab Results:   Additional Information SSN [id number]  [address] [place], [place]

## 2019-11-13 NOTE — TOC Progression Note (Signed)
Transition of Care [facility]) - Progression Note    Patient Details  Name: [name] MRN: [id number] Date of Birth: 08/04/1950  Transition of Care [facility]) CM/SW [place], [place] Phone Number: 11/13/2019, 4:34 PM  Clinical Narrative:    CSW met with pt and wife bedside to discuss SNF choice. Pt now unsure and expresses concern about being deemed dangerous to self and losing his house. CSW explained that such a situation doesn't seem applicable to pt. Pt stated he is reconsidering HH vs SNF and will have a decision in AM.    Expected Discharge Plan: [place] (vs [facility]) Barriers to Discharge: Continued Medical Work up, Ship broker, Environmental education officer)  Expected Discharge Plan and Services Expected Discharge Plan: [place] (vs [facility])       Living arrangements for the past 2 months: [place]: PT [place]: [place] Date [place]: 11/12/19   Representative spoke with at [place]: Jenny Reichmann   Social Determinants of Health ([place]) Interventions    Readmission Risk Interventions No flowsheet data found.

## 2019-11-13 NOTE — Progress Notes (Signed)
RE: [name]  Date of Birth: [birth year]  Date: 11/13/19  To Whom It May Concern:  Please be advised that the above-named patient will require a short-term nursing home stay - anticipated 30 days or less for rehabilitation and strengthening. The plan is for return home.

## 2019-11-13 NOTE — Progress Notes (Signed)
Subjective: No complaints.  Tolerated clear liquids without any difficulty.  Objective: Vital signs in last 24 hours: Temp:  [97.4 F (36.3 C)-98.9 F (37.2 C)] 98.5 F (36.9 C) (06/29 0311) Pulse Rate:  [70-124] 70 (06/29 0311) Resp:  [19-37] 23 (06/29 0311) BP: (98-129)/(61-95) 112/82 (06/29 0311) SpO2:  [90 %-97 %] 97 % (06/29 0311) Last BM Date: 11/09/19  Intake/Output from previous day: 06/28 0701 - 06/29 0700 In: 390 [P.O.:240; I.V.:150] Out: 2675 [Urine:2675] Intake/Output this shift: No intake/output data recorded.  General appearance: alert and no distress Resp: clear to auscultation bilaterally Cardio: regular rate and rhythm GI: soft, non-tender; bowel sounds normal; no masses,  no organomegaly and no evidence of a succussion splash Extremities: extremities normal, atraumatic, no cyanosis or edema  Lab Results: Recent Labs    11/11/19 0234  WBC 14.5*  HGB 16.1  HCT 47.5  PLT 384   BMET Recent Labs    11/11/19 0234  NA 141  K 4.2  CL 102  CO2 28  GLUCOSE 131*  BUN 17  CREATININE 0.99  CALCIUM 10.2   LFT Recent Labs    11/11/19 0234  ALBUMIN 2.9*   PT/INR No results for input(s): LABPROT, INR in the last 72 hours. Hepatitis Panel No results for input(s): HEPBSAG, HCVAB, HEPAIGM, HEPBIGM in the last 72 hours. C-Diff No results for input(s): CDIFFTOX in the last 72 hours. Fecal Lactopherrin No results for input(s): FECLLACTOFRN in the last 72 hours.  Studies/Results: No results found.  Medications:  Scheduled: . amLODipine  5 mg Oral QHS  . aspirin EC  81 mg Oral QHS  . atenolol  25 mg Oral QHS  . atenolol  25 mg Oral Daily  . busPIRone  5 mg Oral BID  . clopidogrel  75 mg Oral QHS  . docusate sodium  100 mg Oral BID  . enoxaparin (LOVENOX) injection  40 mg Subcutaneous Q24H  . feeding supplement (ENSURE ENLIVE)  237 mL Oral TID BM  . insulin aspart  0-15 Units Subcutaneous TID WC  . insulin aspart  0-5 Units Subcutaneous QHS  .  metoCLOPramide  5 mg Oral TID AC  . mirtazapine  45 mg Oral QHS  . mometasone-formoterol  2 puff Inhalation BID  . multivitamin with minerals  1 tablet Oral Daily  . pantoprazole  40 mg Oral Daily  . polyethylene glycol  17 g Oral Daily  . pravastatin  40 mg Oral q1800  . predniSONE  60 mg Oral Q breakfast  . sodium chloride flush  3 mL Intravenous Q12H  . umeclidinium bromide  1 puff Inhalation Daily   Continuous:   Assessment/Plan: 1) LA Grade D esophagitis. 2) COPD.   The examination today was negative for any retained fluid in the gastric lumen to auscultation.  He denies any pain.  The EGD was negative for any mechanical GOO.  Plan: 1) Advance to a regular diet.  LOS: 6 days   Jihan Mellette D 11/13/2019, 7:02 AM

## 2019-11-13 NOTE — Progress Notes (Signed)
Physical Therapy Treatment Patient Details Name: [name] MRN: [id number] DOB: 1951/04/08 Today's Date: 11/13/2019    History of Present Illness Patient is a 69 y.o. male with PMH significant of OSA on CPAP; CAD; HTN; HLD; DM; and COPD on 4L home. Patient presented to the ED on 6/23 with complaint of shortness of breath, cough, wheezing sweating, dysuria ongoing for last 4 days.  Pt admitted with acute on chronic resp failure with COPD exacerbation and PNE.    PT Comments    Pt demonstrates gradual progress but remains limited by anxiety and increased RR.  His vitals were stable on 5 LPM O2 for transfers and standing, but pt with increased RR and anxiety that limited to standing only.  Session focused on cues for controlled breathing with transfers and exercises.  Additionally, incorporated bed exercise program for strengthening and improved breathing (chest opening exercises and diaphragmatic breathing) .    Follow Up Recommendations  SNF     Equipment Recommendations  Other (comment) (would benefit from w/c but reports won't fit in house)    Recommendations for Other Services       Precautions / Restrictions Precautions Precautions: Fall Precaution Comments: anxiety; monitor O2    Mobility  Bed Mobility Overal bed mobility: Needs Assistance Bed Mobility: Supine to Sit;Sit to Supine     Supine to sit: Mod assist Sit to supine: Min assist   General bed mobility comments: increased time, cues for relaxation and slow controlled movements rather than hurried/impulsive movements that are ineffective and require more energy  Transfers Overall transfer level: Needs assistance Equipment used:  (held back of chair) Transfers: Sit to/from Stand Sit to Stand: Min assist;From elevated surface         General transfer comment: cues for foot placement and safe hand placement; min A to boost  Ambulation/Gait             General Gait Details: deferred   Stairs              Wheelchair Mobility    Modified Rankin (Stroke Patients Only)       Balance Overall balance assessment: Needs assistance Sitting-balance support: Feet supported;Single extremity supported Sitting balance-Leahy Scale: Fair Sitting balance - Comments: Pt sat EOB for 10-12 minutes.  Initially with min guard but at times fatigued and required min A for balance (tending to lean L).  Frequent cues for relaxation, breathing technique, and posture.     Standing balance-Leahy Scale: Poor Standing balance comment: Stood ~20 seconds with min A for balance and UE support on chair.                            Cognition Arousal/Alertness: Awake/alert Behavior During Therapy: Anxious Overall Cognitive Status: Within Functional Limits for tasks assessed Area of Impairment: Problem solving;Safety/judgement                         Safety/Judgement: Decreased awareness of safety;Decreased awareness of deficits            Exercises General Exercises - Lower Extremity - cues for slow controlled movements with breaths. Heel Slides: AROM;Both;10 reps;Supine Hip ABduction/ADduction: AROM;Both;10 reps;Supine Other Exercises Other Exercises: Diaphragmatic breathing and scapular retraction x 10 supine Other Exercises: Partial bridging x 5    General Comments General comments (skin integrity, edema, etc.): Pt was on 5 LPM O2 with all HR, BP, and O2 stable throughout session. Did  have increased RR with transfers and was provided cues for slow, controlled breathing. Pt educated on controlled breathing with exercises and bed exercise program to assist with strengthening and improved breathing (chest opening exercises and diaphragmatic breathing).  Pt likes jazz music - played music on phone to encourage relaxation.      Pertinent Vitals/Pain Pain Assessment: No/denies pain    Home Living                      Prior Function            PT Goals (current  goals can now be found in the care plan section) Acute Rehab PT Goals Patient Stated Goal: get stronger PT Goal Formulation: With patient/family Time For Goal Achievement: 11/23/19 Potential to Achieve Goals: Good Progress towards PT goals: Progressing toward goals    Frequency    Min 3X/week      PT Plan Current plan remains appropriate    Co-evaluation              AM-PAC PT "6 Clicks" Mobility   Outcome Measure  Help needed turning from your back to your side while in a flat bed without using bedrails?: A Little Help needed moving from lying on your back to sitting on the side of a flat bed without using bedrails?: A Lot Help needed moving to and from a bed to a chair (including a wheelchair)?: A Lot Help needed standing up from a chair using your arms (e.g., wheelchair or bedside chair)?: A Little Help needed to walk in hospital room?: A Lot Help needed climbing 3-5 steps with a railing? : Total 6 Click Score: 13    End of Session Equipment Utilized During Treatment: Oxygen;Gait belt Activity Tolerance: Patient limited by fatigue (limited by fatigue and anxieyt) Patient left: in bed;with call bell/phone within reach;with bed alarm set Nurse Communication: Mobility status (condom cath needs replacment) PT Visit Diagnosis: Other abnormalities of gait and mobility (R26.89);Muscle weakness (generalized) (M62.81)     Time: 4696-2952 PT Time Calculation (min) (ACUTE ONLY): 36 min  Charges:  $Therapeutic Exercise: 8-22 mins $Therapeutic Activity: 8-22 mins                     Abran Richard, PT Acute Rehab Services Pager [phone] [name] [phone]     [name] 11/13/2019, 5:36 PM

## 2019-11-13 NOTE — TOC Progression Note (Signed)
Transition of Care [facility]) - Progression Note    Patient Details  Name: [name] MRN: [id number] Date of Birth: 02-15-1951  Transition of Care [facility]) CM/SW [place], [place] Phone Number: 11/13/2019, 2:01 PM  Clinical Narrative:    This CSW assisting floor CSW, have provided pt and pt wife with offers/CMS ratings. Answered questions regarding coverage for SNF, they understand 20 days covered as long as medically necessary and that after that time pt may have a copay. Pt wife had several legal questions about SNF placements etc. Recommended that she and pt meet with an eldercare attorney to answer those and to work on future care planning. Pt still deciding between SNF and [facility], understands that prior auth pending. Encouraged pt and pt wife to have a top two offers by tomorrow. Have relayed this information to [facility] who is following pt care.   Ref # for auth under [facility] Medicare is [id number].   Expected Discharge Plan: [place] (vs [facility]) Barriers to Discharge: Continued Medical Work up, Ship broker, Environmental education officer)  Expected Discharge Plan and Services Expected Discharge Plan: [place] (vs [facility]) Living arrangements for the past 2 months: [place]: PT [place]: [place] Date [place]: 11/12/19 Representative spoke with at [place]: Jenny Reichmann  Readmission Risk Interventions No flowsheet data found.

## 2019-11-14 ENCOUNTER — Encounter (HOSPITAL_COMMUNITY): Payer: Self-pay | Admitting: Gastroenterology

## 2019-11-14 LAB — GLUCOSE, CAPILLARY
Glucose-Capillary: 110 mg/dL — ABNORMAL HIGH (ref 70–99)
Glucose-Capillary: 125 mg/dL — ABNORMAL HIGH (ref 70–99)
Glucose-Capillary: 141 mg/dL — ABNORMAL HIGH (ref 70–99)
Glucose-Capillary: 153 mg/dL — ABNORMAL HIGH (ref 70–99)

## 2019-11-14 LAB — SARS CORONAVIRUS 2 (TAT 6-24 HRS): SARS Coronavirus 2: NEGATIVE

## 2019-11-14 MED ORDER — METOCLOPRAMIDE HCL 5 MG PO TABS
5.0000 mg | ORAL_TABLET | Freq: Two times a day (BID) | ORAL | Status: DC
  Administered 2019-11-14 – 2019-11-15 (×2): 5 mg via ORAL
  Filled 2019-11-14 (×2): qty 1

## 2019-11-14 MED ORDER — PREDNISONE 20 MG PO TABS
20.0000 mg | ORAL_TABLET | Freq: Every day | ORAL | Status: DC
  Administered 2019-11-15: 20 mg via ORAL
  Filled 2019-11-14: qty 1

## 2019-11-14 NOTE — Progress Notes (Signed)
Subjective: He feels well and he tolerated a regular diet yesterday.  Objective: Vital signs in last 24 hours: Temp:  [97.6 F (36.4 C)-98 F (36.7 C)] 98 F (36.7 C) (06/30 0300) Pulse Rate:  [78-82] 82 (06/29 1600) Resp:  [19-26] 25 (06/29 2306) BP: (102-122)/(75-92) 110/75 (06/29 1600) SpO2:  [90 %-98 %] 94 % (06/30 0737) Last BM Date: 11/09/19  Intake/Output from previous day: No intake/output data recorded. Intake/Output this shift: No intake/output data recorded.  General appearance: alert and no distress GI: soft, non-tender; bowel sounds normal; no masses,  no organomegaly and some tympany  Lab Results: No results for input(s): WBC, HGB, HCT, PLT in the last 72 hours. BMET No results for input(s): NA, K, CL, CO2, GLUCOSE, BUN, CREATININE, CALCIUM in the last 72 hours. LFT No results for input(s): PROT, ALBUMIN, AST, ALT, ALKPHOS, BILITOT, BILIDIR, IBILI in the last 72 hours. PT/INR No results for input(s): LABPROT, INR in the last 72 hours. Hepatitis Panel No results for input(s): HEPBSAG, HCVAB, HEPAIGM, HEPBIGM in the last 72 hours. C-Diff No results for input(s): CDIFFTOX in the last 72 hours. Fecal Lactopherrin No results for input(s): FECLLACTOFRN in the last 72 hours.  Studies/Results: No results found.  Medications:  Scheduled: . amLODipine  5 mg Oral QHS  . aspirin EC  81 mg Oral QHS  . atenolol  25 mg Oral QHS  . atenolol  25 mg Oral Daily  . busPIRone  5 mg Oral BID  . clopidogrel  75 mg Oral QHS  . docusate sodium  100 mg Oral BID  . enoxaparin (LOVENOX) injection  40 mg Subcutaneous Q24H  . feeding supplement (ENSURE ENLIVE)  237 mL Oral TID BM  . insulin aspart  0-15 Units Subcutaneous TID WC  . insulin aspart  0-5 Units Subcutaneous QHS  . metoCLOPramide  5 mg Oral TID AC  . mirtazapine  45 mg Oral QHS  . mometasone-formoterol  2 puff Inhalation BID  . multivitamin with minerals  1 tablet Oral Daily  . pantoprazole  40 mg Oral Daily  .  polyethylene glycol  17 g Oral Daily  . pravastatin  40 mg Oral q1800  . predniSONE  60 mg Oral Q breakfast  . sodium chloride flush  3 mL Intravenous Q12H  . umeclidinium bromide  1 puff Inhalation Daily   Continuous:   Assessment/Plan: 1) GOO - resolved. 2) COPD.   The patient is well at this time.  He ate regular meals yesterday and he did not have any complaints of abdominal pressure.  The physical examination today does show some tympany, but he is currently eating.  The etiology for his functional GOO is not known, but it appears that it resolved.  Plan: 1) Continue with the regular diet. 2) Signing off. 3) Follow up in the office in 2-4 weeks upon discharge.  LOS: 7 days   [name] D 11/14/2019, 7:48 AM

## 2019-11-14 NOTE — NC FL2 (Addendum)
[place] MEDICAID FL2 LEVEL OF CARE SCREENING TOOL     IDENTIFICATION  Patient Name: [name] Birthdate: [birth year] Sex: male Admission Date (Current Location): 11/07/2019  [facility] and [place] Number:  Herbalist and Address:  [facility] [place]. [facility], [place] [address] [place], [place] [address]      Provider Number: [id number]  Attending Physician Name and Address:  Charlynne Cousins, MD  Relative Name and Phone Number:  Usama, Harkless (Spouse) [phone] (Mobile)    Current Level of Care: Hospital Recommended Level of Care: [place] Prior Approval Number:    Date Approved/Denied:   PASRR Number:    [id number]  Discharge Plan: SNF    Current Diagnoses: Patient Active Problem List   Diagnosis Date Noted  . [place] 11/09/2019  . Acute on chronic respiratory failure with hypoxia ([place]) 11/07/2019  . Diabetes mellitus without complication ([place])   . Coronary artery disease   . HLD (hyperlipidemia)   . HTN (hypertension)   . Obstructive sleep apnea   . Elevated prostate specific antigen (PSA) 02/01/2018  . COPD exacerbation ([place]) 11/11/2015  . Smoker 05/02/2013  . COPD GOLD III with reversibility  05/01/2013  . Chronic respiratory failure with hypoxia ([place]) 05/01/2013  . Depression 06/30/2011    Orientation RESPIRATION BLADDER Height & Weight     Self, Time, Situation, Place  O2, Other (Comment) (NC6l and CPAP) External catheter Weight: 160 lb (72.6 kg) Height:  5\' 10"  (177.8 cm)  BEHAVIORAL SYMPTOMS/MOOD NEUROLOGICAL BOWEL NUTRITION STATUS   (none)  (none) Continent Diet (see discharge summary)  AMBULATORY STATUS COMMUNICATION OF NEEDS Skin   Extensive Assist Verbally Normal                       Personal Care Assistance Level of Assistance  Feeding, Bathing, Dressing Bathing Assistance: Limited assistance Feeding assistance: Independent Dressing Assistance: Limited assistance     Functional  Limitations Info  Sight, Hearing, Speech Sight Info: Adequate Hearing Info: Adequate Speech Info: Adequate    SPECIAL CARE FACTORS FREQUENCY  PT (By licensed PT), OT (By licensed OT), Diabetic urine testing   Diabetic Urine Testing Frequency: insulin aspart (novoLOG) injection 0-15 Units, insulin aspart (novoLOG) injection 0-5 Units PT Frequency: 5x/week OT Frequency: 5x/week            Contractures Contractures Info: Not present    Additional Factors Info  Code Status, Allergies, Insulin Sliding Scale Code Status Info: Full Allergies Info: no known allergies   Insulin Sliding Scale Info: insulin aspart (novoLOG) injection 0-15 Units, insulin aspart (novoLOG) injection 0-5 Units       Current Medications (11/14/2019):  This is the current hospital active medication list Current Facility-Administered Medications  Medication Dose Route Frequency Provider Last Rate Last Admin  . acetaminophen (TYLENOL) tablet 650 mg  650 mg Oral Q6H PRN Dana Allan I, MD   650 mg at 11/14/19 0958  . alum & mag hydroxide-simeth (MAALOX/MYLANTA) 200-200-20 MG/5ML suspension 15 mL  15 mL Oral Q4H PRN Carol Ada, MD   15 mL at 11/10/19 1006  . amLODipine (NORVASC) tablet 5 mg  5 mg Oral QHS Carol Ada, MD   5 mg at 11/13/19 2120  . aspirin EC tablet 81 mg  81 mg Oral QHS Carol Ada, MD   81 mg at 11/13/19 2120  . atenolol (TENORMIN) tablet 25 mg  25 mg Oral QHS Carol Ada, MD   25 mg at 11/13/19 2121  .  atenolol (TENORMIN) tablet 25 mg  25 mg Oral Daily Dana Allan I, MD   25 mg at 11/14/19 0959  . busPIRone (BUSPAR) tablet 5 mg  5 mg Oral BID Carol Ada, MD   5 mg at 11/14/19 0958  . clopidogrel (PLAVIX) tablet 75 mg  75 mg Oral QHS Carol Ada, MD   75 mg at 11/13/19 2121  . dextromethorphan-guaiFENesin (MUCINEX DM) 30-600 MG per 12 hr tablet 1 tablet  1 tablet Oral BID PRN Carol Ada, MD   1 tablet at 11/10/19 0145  . docusate sodium (COLACE) capsule 100 mg  100 mg  Oral BID Dana Allan I, MD   100 mg at 11/14/19 1002  . enoxaparin (LOVENOX) injection 40 mg  40 mg Subcutaneous Q24H Carol Ada, MD   40 mg at 11/13/19 1043  . feeding supplement (ENSURE ENLIVE) (ENSURE ENLIVE) liquid 237 mL  237 mL Oral TID BM Carol Ada, MD   237 mL at 11/14/19 1004  . insulin aspart (novoLOG) injection 0-15 Units  0-15 Units Subcutaneous TID WC Carol Ada, MD   3 Units at 11/13/19 1325  . insulin aspart (novoLOG) injection 0-5 Units  0-5 Units Subcutaneous QHS Carol Ada, MD   2 Units at 11/13/19 2121  . levalbuterol (XOPENEX) nebulizer solution 0.63 mg  0.63 mg Nebulization Q6H PRN Dana Allan I, MD      . metoCLOPramide (REGLAN) tablet 5 mg  5 mg Oral BID Charlynne Cousins, MD      . mirtazapine (REMERON) tablet 45 mg  45 mg Oral QHS Carol Ada, MD   45 mg at 11/13/19 2120  . mometasone-formoterol (DULERA) 200-5 MCG/ACT inhaler 2 puff  2 puff Inhalation BID Carol Ada, MD   2 puff at 11/14/19 0736  . multivitamin with minerals tablet 1 tablet  1 tablet Oral Daily Carol Ada, MD   1 tablet at 11/14/19 [phone]  . pantoprazole (PROTONIX) EC tablet 40 mg  40 mg Oral Daily Carol Ada, MD   40 mg at 11/14/19 0958  . polyethylene glycol (MIRALAX / GLYCOLAX) packet 17 g  17 g Oral Daily Dana Allan I, MD   17 g at 11/14/19 1004  . pravastatin (PRAVACHOL) tablet 40 mg  40 mg Oral q1800 Carol Ada, MD   40 mg at 11/13/19 1802  . [START ON 11/15/2019] predniSONE (DELTASONE) tablet 20 mg  20 mg Oral Q breakfast Charlynne Cousins, MD      . sodium chloride flush (NS) 0.9 % injection 3 mL  3 mL Intravenous Q12H Carol Ada, MD   3 mL at 11/14/19 1003  . umeclidinium bromide (INCRUSE ELLIPTA) 62.5 MCG/INH 1 puff  1 puff Inhalation Daily Carol Ada, MD   1 puff at 11/14/19 6283     Discharge Medications: Please see discharge summary for a list of discharge medications.  Relevant Imaging Results:  Relevant Lab Results:   Additional  Information SSN [id number] [address] [place], [place]

## 2019-11-14 NOTE — Progress Notes (Signed)
Pt states he wants to continue to wear CPAP tonight  RT will place patient on after pm meds are given.

## 2019-11-14 NOTE — Progress Notes (Addendum)
[facility] PROGRESS NOTE    Progress Note  [name]  NFA:[id number] DOB: 03-19-1951 DOA: 11/07/2019 PCP: Lucianne Lei, MD     Brief Narrative:   [name] is an 69 y.o. male past medical history of obstructive sleep apnea on CPAP, hypertension hyperlipidemia COPD on 3 L of oxygen bedbound came into the ED on 11/07/2019 with complaint of shortness of breath cough wheezing, in the ED he was found to be satting in the 80s was started on BiPAP, chest x-ray shows slight increase in mid left basilar lung, he was started on IV empiric antibiotics Solu-Medrol magnesium, KUB showed possible outlet obstruction NG tube was placed with intermittent suction GI was consulted who perform an EGD that showed reflux esophagitis with no bleeding  Assessment/Plan:   Possible gastric outlet obstruction: Initially was placed on NG tube with intermittent suctioning which is now resolved. GI was consulted perform an EGD that showed esophageal gastritis, there is a concern for possible diabetic gastroparesis. Started on oral Protonix and Reglan, the patient is tolerating his diet. Currently awaiting path R improvement.  Sinus tachycardia: Resolved likely due to holding atenolol which is now been resumed.  Significant colonic fecal burden: Continue soapsuds enemas and laxatives.  Acute on chronic respiratory failure possibly due to COPD and community-acquired pneumonia: Chest x-ray on admission showed lower lobe infiltrate, he was started empirically on IV Rocephin and azithro and inhalers. Currently being weaned off steroids.  Has completed his course of IV antibiotics.  Obstructive sleep apnea: Continue CPAP at home settings.  Possible UTI: He has completed a course of IV antibiotics of 6 days.  Right groin warts: Follow-up with dermatology as an outpatient.  Diabetes mellitus type 2 with an A1c of 6.1: Metformin on hold continue sliding scale insulin.  Essential  hypertension: Continue Norvasc and atenolol.  Hyperlipidemia: Continue aspirin Plavix and statins.  Neuropsych meds: Continue Remeron and BuSpar.  DVT prophylaxis: lovenox Family Communication:none Status is: Inpatient  Remains inpatient appropriate because:Hemodynamically unstable   Dispo: The patient is from: Home              Anticipated d/c is to: Skilled nursing facility              Anticipated d/c date is: 2 days              Patient currently is not medically stable to d/c.  Awaiting PASSR        Code Status:     Code Status Orders  (From admission, onward)         Start     Ordered   11/07/19 0916  Full code  Continuous        11/07/19 0919        Code Status History    Date Active Date Inactive Code Status Order ID Comments User Context   04/03/2013 1327 04/06/2013 1504 Full Code [id number]  Marily Memos, MD Inpatient   Advance Care Planning Activity        IV Access:    Peripheral IV   Procedures and diagnostic studies:   No results found.   Medical Consultants:    None.  Anti-Infectives:   None  Subjective:    [name] no complaints feels great. Objective:    Vitals:   11/13/19 2306 11/13/19 2335 11/14/19 0300 11/14/19 0737  BP:      Pulse:      Resp: (!) 25     Temp: 97.8 F (36.6  C)  98 F (36.7 C)   TempSrc: Oral  Oral   SpO2:  98%  94%  Weight:      Height:       SpO2: 94 % O2 Flow Rate (L/min): 4 L/min FiO2 (%): 28 %  No intake or output data in the 24 hours ending 11/14/19 0752 Filed Weights   11/12/19 0701  Weight: 72.6 kg    Exam: General exam: In no acute distress. Respiratory system: Good air movement and clear to auscultation. Cardiovascular system: S1 & S2 heard, RRR. No JVD. Gastrointestinal system: Abdomen is nondistended, soft and nontender.  Extremities: No pedal edema. Skin: No rashes, lesions or ulcers Psychiatry: Judgement and insight appear normal. Mood & affect  appropriate.    Data Reviewed:    Labs: Basic Metabolic Panel: Recent Labs  Lab 11/08/19 0410 11/11/19 0234  NA 140 141  K 3.8 4.2  CL 99 102  CO2 27 28  GLUCOSE 146* 131*  BUN 19 17  CREATININE 0.88 0.99  CALCIUM 9.1 10.2  MG  --  2.1  PHOS  --  3.4   GFR Estimated Creatinine Clearance: 71.3 mL/min (by C-G formula based on SCr of 0.99 mg/dL). Liver Function Tests: Recent Labs  Lab 11/11/19 0234  ALBUMIN 2.9*   No results for input(s): LIPASE, AMYLASE in the last 168 hours. No results for input(s): AMMONIA in the last 168 hours. Coagulation profile No results for input(s): INR, PROTIME in the last 168 hours. COVID-19 Labs  No results for input(s): DDIMER, FERRITIN, LDH, CRP in the last 72 hours.  Lab Results  Component Value Date   [place] NEGATIVE 11/13/2019   [place] NEGATIVE 11/07/2019    CBC: Recent Labs  Lab 11/08/19 0410 11/10/19 0448 11/11/19 0234  WBC 16.7* 14.6* 14.5*  NEUTROABS  --  10.3* 11.4*  HGB 14.6 15.6 16.1  HCT 43.6 45.9 47.5  MCV 82.0 81.1 80.4  PLT 270 337 384   Cardiac Enzymes: No results for input(s): CKTOTAL, CKMB, CKMBINDEX, TROPONINI in the last 168 hours. BNP (last 3 results) No results for input(s): PROBNP in the last 8760 hours. CBG: Recent Labs  Lab 11/13/19 0714 11/13/19 1137 11/13/19 1707 11/13/19 2103 11/14/19 0719  GLUCAP 106* 164* 116* 214* 110*   D-Dimer: No results for input(s): DDIMER in the last 72 hours. Hgb A1c: No results for input(s): HGBA1C in the last 72 hours. Lipid Profile: No results for input(s): CHOL, HDL, LDLCALC, TRIG, CHOLHDL, LDLDIRECT in the last 72 hours. Thyroid function studies: No results for input(s): TSH, T4TOTAL, T3FREE, THYROIDAB in the last 72 hours.  Invalid input(s): FREET3 Anemia work up: No results for input(s): VITAMINB12, FOLATE, FERRITIN, TIBC, IRON, RETICCTPCT in the last 72 hours. Sepsis Labs: Recent Labs  Lab 11/08/19 0410 11/10/19 0448  11/11/19 0234  WBC 16.7* 14.6* 14.5*   Microbiology Recent Results (from the past 240 hour(s))  SARS Coronavirus 2 by RT PCR (hospital order, performed in [facility] hospital lab) Nasopharyngeal Nasopharyngeal Swab     Status: None   Collection Time: 11/07/19  6:47 AM   Specimen: Nasopharyngeal Swab  Result Value Ref Range Status   SARS Coronavirus 2 NEGATIVE NEGATIVE Final    Comment: (NOTE) SARS-CoV-2 target nucleic acids are NOT DETECTED.  The SARS-CoV-2 RNA is generally detectable in upper and lower respiratory specimens during the acute phase of infection. The lowest concentration of SARS-CoV-2 viral copies this assay can detect is 250 copies / mL. A negative result does not preclude  SARS-CoV-2 infection and should not be used as the sole basis for treatment or other patient management decisions.  A negative result may occur with improper specimen collection / handling, submission of specimen other than nasopharyngeal swab, presence of viral mutation(s) within the areas targeted by this assay, and inadequate number of viral copies (<250 copies / mL). A negative result must be combined with clinical observations, patient history, and epidemiological information.  Fact Sheet for Patients:   StrictlyIdeas.no  Fact Sheet for Healthcare Providers: BankingDealers.co.za  This test is not yet approved or  cleared by the [place] FDA and has been authorized for detection and/or diagnosis of SARS-CoV-2 by FDA under an Emergency Use Authorization (EUA).  This EUA will remain in effect (meaning this test can be used) for the duration of the COVID-19 declaration under Section 564(b)(1) of the Act, 21 U.S.C. section 360bbb-3(b)(1), unless the authorization is terminated or revoked sooner.  Performed at [facility], [place] [address] [place], [place] [address]   Culture, Urine     Status: None   Collection Time: 11/07/19  1:49  PM   Specimen: Urine, Random  Result Value Ref Range Status   Specimen Description URINE, RANDOM  Final   Special Requests NONE  Final   Culture   Final    NO GROWTH Performed at [facility], [place] [address] [place], [place] [address]    Report Status 11/08/2019 FINAL  Final  SARS CORONAVIRUS 2 (TAT 6-24 HRS) Nasopharyngeal Nasopharyngeal Swab     Status: None   Collection Time: 11/13/19  9:42 PM   Specimen: Nasopharyngeal Swab  Result Value Ref Range Status   SARS Coronavirus 2 NEGATIVE NEGATIVE Final    Comment: (NOTE) SARS-CoV-2 target nucleic acids are NOT DETECTED.  The SARS-CoV-2 RNA is generally detectable in upper and lower respiratory specimens during the acute phase of infection. Negative results do not preclude SARS-CoV-2 infection, do not rule out co-infections with other pathogens, and should not be used as the sole basis for treatment or other patient management decisions. Negative results must be combined with clinical observations, patient history, and epidemiological information. The expected result is Negative.  Fact Sheet for Patients: SugarRoll.be  Fact Sheet for Healthcare Providers: https://www.woods-mathews.com/  This test is not yet approved or cleared by the [place] FDA and  has been authorized for detection and/or diagnosis of SARS-CoV-2 by FDA under an Emergency Use Authorization (EUA). This EUA will remain  in effect (meaning this test can be used) for the duration of the COVID-19 declaration under Se ction 564(b)(1) of the Act, 21 U.S.C. section 360bbb-3(b)(1), unless the authorization is terminated or revoked sooner.  Performed at [facility], [place] [address] [place], [place] [address]      Medications:   . amLODipine  5 mg Oral QHS  . aspirin EC  81 mg Oral QHS  . atenolol  25 mg Oral QHS  . atenolol  25 mg Oral Daily  . busPIRone  5 mg Oral BID  . clopidogrel  75 mg Oral  QHS  . docusate sodium  100 mg Oral BID  . enoxaparin (LOVENOX) injection  40 mg Subcutaneous Q24H  . feeding supplement (ENSURE ENLIVE)  237 mL Oral TID BM  . insulin aspart  0-15 Units Subcutaneous TID WC  . insulin aspart  0-5 Units Subcutaneous QHS  . metoCLOPramide  5 mg Oral TID AC  . mirtazapine  45 mg Oral QHS  . mometasone-formoterol  2 puff Inhalation BID  . multivitamin with minerals  1 tablet Oral Daily  . pantoprazole  40 mg Oral Daily  . polyethylene glycol  17 g Oral Daily  . pravastatin  40 mg Oral q1800  . predniSONE  60 mg Oral Q breakfast  . sodium chloride flush  3 mL Intravenous Q12H  . umeclidinium bromide  1 puff Inhalation Daily   Continuous Infusions:    LOS: 7 days   Charlynne Cousins  [facility]  11/14/2019, 7:52 AM

## 2019-11-14 NOTE — TOC Progression Note (Signed)
Transition of Care [facility]) - Progression Note    Patient Details  Name: [name] MRN: [id number] Date of Birth: [birth year]  Transition [facility]) CM/SW [place], [place] Phone Number: 11/14/2019, 4:35 PM  Clinical Narrative:   CSW met with patient and wife at bedside earlier today, they are still deciding between home and SNF but considering SNF. Patient would like [facility] or [facility], wife asked about [facility]. CSW asked [facility] to review referral, and they are unable to offer a bed. CSW met with patient to update him on [facility] bed offer, and patient will likely go with that as a discharge plan. Patient asked if he decided to change his mind again tomorrow, and CSW assured him that he could still go home if he wanted to. Patient appreciative of CSW assistance.  Patient also asked about adding his wife to his will, and CSW explained to patient that he would need a lawyer outside of the hospital to handle that.   CSW uploaded corrected FL2 to [facility] for PASRR review. CSW to continue to follow.    Expected Discharge Plan: [place] (vs [facility]) Barriers to Discharge: Continued Medical Work up, Ship broker, Environmental education officer)  Expected Discharge Plan and Services Expected Discharge Plan: [place] (vs [facility])       Living arrangements for the past 2 months: [place]: PT [place]: [place] Date [place]: 11/12/19   Representative spoke with at [place]: Jenny Reichmann   Social Determinants of Health ([place]) Interventions    Readmission Risk Interventions No flowsheet data found.

## 2019-11-15 DIAGNOSIS — J9611 Chronic respiratory failure with hypoxia: Secondary | ICD-10-CM | POA: Diagnosis not present

## 2019-11-15 DIAGNOSIS — R29818 Other symptoms and signs involving the nervous system: Secondary | ICD-10-CM | POA: Diagnosis not present

## 2019-11-15 DIAGNOSIS — J189 Pneumonia, unspecified organism: Secondary | ICD-10-CM | POA: Diagnosis not present

## 2019-11-15 DIAGNOSIS — J449 Chronic obstructive pulmonary disease, unspecified: Secondary | ICD-10-CM | POA: Diagnosis not present

## 2019-11-15 DIAGNOSIS — M6281 Muscle weakness (generalized): Secondary | ICD-10-CM | POA: Diagnosis not present

## 2019-11-15 DIAGNOSIS — R5381 Other malaise: Secondary | ICD-10-CM | POA: Diagnosis not present

## 2019-11-15 DIAGNOSIS — E1151 Type 2 diabetes mellitus with diabetic peripheral angiopathy without gangrene: Secondary | ICD-10-CM | POA: Diagnosis not present

## 2019-11-15 DIAGNOSIS — I10 Essential (primary) hypertension: Secondary | ICD-10-CM | POA: Diagnosis not present

## 2019-11-15 DIAGNOSIS — M2550 Pain in unspecified joint: Secondary | ICD-10-CM | POA: Diagnosis not present

## 2019-11-15 DIAGNOSIS — R0602 Shortness of breath: Secondary | ICD-10-CM | POA: Diagnosis not present

## 2019-11-15 DIAGNOSIS — B079 Viral wart, unspecified: Secondary | ICD-10-CM | POA: Diagnosis not present

## 2019-11-15 DIAGNOSIS — K208 Other esophagitis without bleeding: Secondary | ICD-10-CM | POA: Diagnosis not present

## 2019-11-15 DIAGNOSIS — F418 Other specified anxiety disorders: Secondary | ICD-10-CM | POA: Diagnosis not present

## 2019-11-15 DIAGNOSIS — I2510 Atherosclerotic heart disease of native coronary artery without angina pectoris: Secondary | ICD-10-CM | POA: Diagnosis not present

## 2019-11-15 DIAGNOSIS — Z743 Need for continuous supervision: Secondary | ICD-10-CM | POA: Diagnosis not present

## 2019-11-15 DIAGNOSIS — J441 Chronic obstructive pulmonary disease with (acute) exacerbation: Secondary | ICD-10-CM

## 2019-11-15 DIAGNOSIS — K2900 Acute gastritis without bleeding: Secondary | ICD-10-CM | POA: Diagnosis not present

## 2019-11-15 DIAGNOSIS — R300 Dysuria: Secondary | ICD-10-CM | POA: Diagnosis not present

## 2019-11-15 DIAGNOSIS — J9621 Acute and chronic respiratory failure with hypoxia: Secondary | ICD-10-CM | POA: Diagnosis not present

## 2019-11-15 DIAGNOSIS — G4733 Obstructive sleep apnea (adult) (pediatric): Secondary | ICD-10-CM | POA: Diagnosis not present

## 2019-11-15 DIAGNOSIS — Z9981 Dependence on supplemental oxygen: Secondary | ICD-10-CM | POA: Diagnosis not present

## 2019-11-15 DIAGNOSIS — E1122 Type 2 diabetes mellitus with diabetic chronic kidney disease: Secondary | ICD-10-CM | POA: Diagnosis not present

## 2019-11-15 DIAGNOSIS — Z7401 Bed confinement status: Secondary | ICD-10-CM | POA: Diagnosis not present

## 2019-11-15 DIAGNOSIS — K2960 Other gastritis without bleeding: Secondary | ICD-10-CM | POA: Diagnosis not present

## 2019-11-15 DIAGNOSIS — K2970 Gastritis, unspecified, without bleeding: Secondary | ICD-10-CM | POA: Diagnosis not present

## 2019-11-15 DIAGNOSIS — E119 Type 2 diabetes mellitus without complications: Secondary | ICD-10-CM | POA: Diagnosis not present

## 2019-11-15 DIAGNOSIS — R1312 Dysphagia, oropharyngeal phase: Secondary | ICD-10-CM | POA: Diagnosis not present

## 2019-11-15 DIAGNOSIS — E785 Hyperlipidemia, unspecified: Secondary | ICD-10-CM | POA: Diagnosis not present

## 2019-11-15 DIAGNOSIS — K219 Gastro-esophageal reflux disease without esophagitis: Secondary | ICD-10-CM | POA: Diagnosis not present

## 2019-11-15 DIAGNOSIS — N3281 Overactive bladder: Secondary | ICD-10-CM | POA: Diagnosis not present

## 2019-11-15 DIAGNOSIS — K5900 Constipation, unspecified: Secondary | ICD-10-CM | POA: Diagnosis not present

## 2019-11-15 LAB — GLUCOSE, CAPILLARY
Glucose-Capillary: 92 mg/dL (ref 70–99)
Glucose-Capillary: 162 mg/dL — ABNORMAL HIGH (ref 70–99)

## 2019-11-15 MED ORDER — PREDNISONE 10 MG PO TABS
ORAL_TABLET | ORAL | 0 refills | Status: DC
Start: 2019-11-15 — End: 2019-11-20

## 2019-11-15 MED ORDER — PANTOPRAZOLE SODIUM 40 MG PO TBEC: 40.0000 mg | DELAYED_RELEASE_TABLET | Freq: Every day | ORAL | 0 refills | Status: DC

## 2019-11-15 NOTE — TOC Transition Note (Addendum)
Transition of Care [facility] - CM/SW Discharge Note   Patient Details  Name: [name] MRN: [id number] Date of Birth: 11-28-1950  Transition [facility] Care [facility]) CM/SW Contact:  Bethann Berkshire, [place] Phone Number: 11/15/2019, 10:38 AM   Clinical Narrative:     Met with pt to discuss SNF decision. Pt adamant about going home with [facility] now. States he will do [facility] PT and can pay out of pocket for aids. Pt does request 3 in 1. 3 in 1 arranged with [employer]. They will deliver to home.   CSW contacted wife. Wife is upset insisting pt needs rehab. CSW informed wife that pt is alert and oriented x4 and is able to make decisions for himself. CSW encourages wife to discuss pts decision with pt.   CSW informed pt that his wife is upset and not wanting pt to come home. Pt still adamant about returning home. States he has the ability and assistance enough to be able to be taken care of at home.   1132: Pt called CSW and explained he now wants SNF. CSW explained to pt that he need to make sure of his decision and not go back and forth. Pt confirms He wants SNF at [facility] CSW called pt wife following discussion and confirmed [facility].   CSW confirmed bed with [facility].   1328; CSW received Auth 7/1 -- 7/6 Auth #[id number] Ref Number: [id number] Case Coordinator: Arist D.   Patient will DC to: [facility] SNF Anticipated DC date: 11/15/19 Family notified: Darius Bump  Transport by: Corey Harold   Per MD patient ready for DC to [place] . RN, patient, patient's family, and facility notified of DC. Discharge Summary and FL2 sent to facility. RN to call report prior to discharge (Room 312 [phone] ). DC packet on chart. Ambulance transport requested for patient.   CSW will sign off for now as social work intervention is no longer needed. Please consult [place] again if new needs arise.     Final next level of care: [place] Barriers to Discharge: No Barriers Identified   Patient  Goals and CMS Choice Patient states their goals for this hospitalization and ongoing recovery are:: Go home CMS Medicare.gov Compare Post Acute Care list provided to:: Patient Choice offered to / list presented to : Spouse, Patient  Discharge Placement                Patient to be transferred to facility by: [place] Name of family member notified: Zaidan, [place] (Spouse) [phone] (Mobile) Patient and family notified of of transfer: 11/15/19  Discharge Plan and Services                  DME Agency: [employer] Date DME Agency Contacted: 11/15/19   Representative spoke with at DME Agency: [place]: PT [place]: [place] Date [place]: 11/12/19   Representative spoke with at [place]: [place] ([name]) Interventions     Readmission Risk Interventions No flowsheet data found.

## 2019-11-15 NOTE — Discharge Summary (Signed)
Physician Discharge Summary  [name] VWU:[id number] DOB: 10-Oct-1950 DOA: 11/07/2019  PCP: Lucianne Lei, MD  Admit date: 11/07/2019 Discharge date: 11/15/2019  Admitted From: Home Disposition:  Home  Recommendations for Outpatient Follow-up:  1. Follow up with PCP in 1-2 weeks   Home Health:Yes Equipment/Devices:none  Discharge Condition:Stable CODE STATUS:Full Diet recommendation: Heart Healthy   Brief/Interim Summary: 69 y.o. male past medical history of obstructive sleep apnea on CPAP, hypertension hyperlipidemia COPD on 3 L of oxygen bedbound came into the ED on 11/07/2019 with complaint of shortness of breath cough wheezing, in the ED he was found to be satting in the 80s was started on BiPAP, chest x-ray shows slight increase in mid left basilar lung, he was started on IV empiric antibiotics Solu-Medrol magnesium, KUB showed possible outlet obstruction NG tube was placed with intermittent suction GI was consulted   Discharge Diagnoses:  Principal Problem:   Acute on chronic respiratory failure with hypoxia ([place]) Active Problems:   COPD exacerbation ([place])   Diabetes mellitus without complication ([place])   Coronary artery disease   HLD (hyperlipidemia)   HTN (hypertension)   Obstructive sleep apnea  Possible gastric outlet obstruction: NG tube was placed with intermittent suction GI was consulted who performed an EGD that showed mild esophagitis with gastritis. He was started on IV Protonix NG tube was discontinued which he tolerated his diet well. Physical therapy evaluated the patient and recommended skilled nursing facility. The patient refused skilled nursing facility and would like to go home with home health.  Sinus tachycardia: Resolved likely due to holding atenolol resume.  Significant colonic fecal burden: Multiple soapsuds enemas and laxatives were given now resolved.  Acute on chronic respiratory failure with hypoxia due to COPD and community-acquired  pneumonia: He completed course of antibiotics in house is currently on a steroid taper.  Obstructive sleep apnea: Continue CPAP at home.  Possible UTI: Completed course of right the antibiotics in house.  Right groin wart: Follow-up with rheumatology as an outpatient.  Diabetes mellitus type 2: With an A1c of 6.1 no change made to his medication.  Essential hypertension: No changes made to his medication.  Hyperlipidemia: Continue aspirin, statins and Plavix.  Neuropsychiatric meds: Continue Remeron and BuSpar.  Discharge Instructions  Discharge Instructions    Ambulatory referral to Dermatology   Complete by: As directed    Diet - low sodium heart healthy   Complete by: As directed    Increase activity slowly   Complete by: As directed      Allergies as of 11/15/2019   No Known Allergies     Medication List    STOP taking these medications   dextromethorphan-guaiFENesin 30-600 MG 12hr tablet Commonly known as: [place] DM     TAKE these medications   albuterol 108 (90 Base) MCG/ACT inhaler Commonly known as: ProAir HFA 2 puffs every 4 hours as needed only  if your can't catch your breath   amLODipine 5 MG tablet Commonly known as: NORVASC Take 5 mg by mouth at bedtime.   aspirin EC 81 MG tablet Take 81 mg by mouth at bedtime.   atenolol 25 MG tablet Commonly known as: TENORMIN Take 25 mg by mouth at bedtime.   budesonide-formoterol 160-4.5 MCG/ACT inhaler Commonly known as: Symbicort Inhale 2 puffs into the lungs 2 (two) times daily.   busPIRone 5 MG tablet Commonly known as: BUSPAR Take 1 tablet (5 mg total) by mouth 3 (three) times daily. What changed: when to take this  clopidogrel 75 MG tablet Commonly known as: PLAVIX Take 75 mg by mouth at bedtime.   CoQ10 100 MG Caps Take 1 capsule by mouth at bedtime.   Fish Oil 1000 MG Caps Take 1,000 mg by mouth daily.   metFORMIN 500 MG tablet Commonly known as: GLUCOPHAGE Take 1,000 mg by  mouth at bedtime. 2 tabs by mouth once daily at bedtime   mirtazapine 45 MG tablet Commonly known as: REMERON Take 1 tablet (45 mg total) by mouth at bedtime.   multivitamin with minerals Tabs tablet Take 1 tablet by mouth at bedtime.   nitroGLYCERIN 0.4 MG SL tablet Commonly known as: NITROSTAT Place 1 tablet (0.4 mg total) under the tongue every 5 (five) minutes as needed for chest pain.   omega-3 acid ethyl esters 1 g capsule Commonly known as: LOVAZA Take by mouth.   OXYGEN Inhale 4 L into the lungs continuous. With exertion only   pantoprazole 40 MG tablet Commonly known as: PROTONIX Take 1 tablet (40 mg total) by mouth daily.   pravastatin 40 MG tablet Commonly known as: PRAVACHOL Take 40 mg by mouth daily. Taking at bedtime   predniSONE 10 MG tablet Commonly known as: DELTASONE Takes 2 tabs for 1 days, then 1 tab for 1 days, and then stop.   sildenafil 25 MG tablet Commonly known as: VIAGRA Take 25 mg by mouth See admin instructions. Use as directed   sodium chloride 0.65 % Soln nasal spray Commonly known as: OCEAN Place 2 sprays into both nostrils as needed for congestion.   Spiriva Respimat 2.5 MCG/ACT Aers Generic drug: Tiotropium Bromide Monohydrate Inhale 2 puffs into the lungs daily.   testosterone cypionate 200 MG/ML injection Commonly known as: DEPOTESTOSTERONE CYPIONATE Inject 200 mg into the muscle every 21 ( twenty-one) days.   vitamin B-12 1000 MCG tablet Commonly known as: CYANOCOBALAMIN Take 1,000 mcg by mouth daily.            Durable Medical Equipment  (From admission, onward)         Start     Ordered   11/15/19 0825  DME Gilford Rile  Once       Question Answer Comment  Walker: With 5 Inch Wheels   Patient needs a walker to treat with the following condition COPD (chronic obstructive pulmonary disease) ([place])      11/15/19 0827          Follow-up Information    [facility].   Specialty: Home Health  Services Why: Representative from [place] will contact you to schedule start of home health services. Contact information: [place] [address] [phone]              No Known Allergies  Consultations:  GI   Procedures/Studies: DG Abd 1 View  Result Date: 11/10/2019 CLINICAL DATA:  Abdominal distension with epigastric pain. EXAM: ABDOMEN - 1 VIEW COMPARISON:  None. FINDINGS: Moderate gaseous distention of the stomach. This may be the result of a degree of gastric outlet obstruction. Remainder of the bowel gas pattern is nonobstructive with mild fecal retention over the right colon. No free peritoneal air. Mild degenerative change of the spine. Bilateral hip arthroplasties. IMPRESSION: Moderate gastric distention of the stomach which could be due to a degree of gastric outlet obstruction. Electronically Signed   By: Marin Olp M.D.   On: 11/10/2019 12:31   DG Chest Port 1 View  Result Date: 11/07/2019 CLINICAL DATA:  COPD.  EXAM: PORTABLE CHEST 1 VIEW COMPARISON:  None. FINDINGS: Heart size normal. Atherosclerotic changes are noted in the aorta. Mild airspace opacities at the left base have increased slightly. IMPRESSION: Slight increase in mild left basilar airspace disease. While this may represent atelectasis, infection is not excluded. Electronically Signed   By: San Morelle M.D.   On: 11/07/2019 07:43   DG Abd Portable 1V  Result Date: 11/10/2019 CLINICAL DATA:  Heartburn. EXAM: PORTABLE ABDOMEN - 1 VIEW COMPARISON:  11/10/2019 FINDINGS: Interval placement of a nasogastric tube without the previously demonstrated gaseous distention of the stomach. Normal bowel gas pattern. Prominent stool in the right colon. Left hip prosthesis. IMPRESSION: 1. Nasogastric tube in the stomach. 2. Prominent stool in the right colon. Electronically Signed   By: Claudie Revering M.D.   On: 11/10/2019 18:38     Subjective: No new Complaints, he really wants to go  home.  Discharge Exam: Vitals:   11/15/19 0809 11/15/19 0810  BP:    Pulse:    Resp:    Temp:    SpO2: 93% 93%   Vitals:   11/15/19 0404 11/15/19 0752 11/15/19 0809 11/15/19 0810  BP: 123/68 118/85    Pulse: 63 76    Resp: 20 17    Temp:  (!) 97.5 F (36.4 C)    TempSrc:  Oral    SpO2: 98% 94% 93% 93%  Weight:      Height:        General: Pt is alert, awake, not in acute distress Cardiovascular: RRR, S1/S2 +, no rubs, no gallops Respiratory: CTA bilaterally, no wheezing, no rhonchi Abdominal: Soft, NT, ND, bowel sounds + Extremities: no edema, no cyanosis    The results of significant diagnostics from this hospitalization (including imaging, microbiology, ancillary and laboratory) are listed below for reference.     Microbiology: Recent Results (from the past 240 hour(s))  SARS Coronavirus 2 by RT PCR (hospital order, performed in [facility] hospital lab) Nasopharyngeal Nasopharyngeal Swab     Status: None   Collection Time: 11/07/19  6:47 AM   Specimen: Nasopharyngeal Swab  Result Value Ref Range Status   SARS Coronavirus 2 NEGATIVE NEGATIVE Final    Comment: (NOTE) SARS-CoV-2 target nucleic acids are NOT DETECTED.  The SARS-CoV-2 RNA is generally detectable in upper and lower respiratory specimens during the acute phase of infection. The lowest concentration of SARS-CoV-2 viral copies this assay can detect is 250 copies / mL. A negative result does not preclude SARS-CoV-2 infection and should not be used as the sole basis for treatment or other patient management decisions.  A negative result may occur with improper specimen collection / handling, submission of specimen other than nasopharyngeal swab, presence of viral mutation(s) within the areas targeted by this assay, and inadequate number of viral copies (<250 copies / mL). A negative result must be combined with clinical observations, patient history, and epidemiological information.  Fact Sheet for  Patients:   StrictlyIdeas.no  Fact Sheet for Healthcare Providers: BankingDealers.co.za  This test is not yet approved or  cleared by the [place] FDA and has been authorized for detection and/or diagnosis of SARS-CoV-2 by FDA under an Emergency Use Authorization (EUA).  This EUA will remain in effect (meaning this test can be used) for the duration of the COVID-19 declaration under Section 564(b)(1) of the Act, 21 U.S.C. section 360bbb-3(b)(1), unless the authorization is terminated or revoked sooner.  Performed at [facility], [place] [address] [place], [place]  [address]   Culture, Urine     Status: None   Collection Time: 11/07/19  1:49 PM   Specimen: Urine, Random  Result Value Ref Range Status   Specimen Description URINE, RANDOM  Final   Special Requests NONE  Final   Culture   Final    NO GROWTH Performed at [facility], [address] [place], [place] [address]    Report Status 11/08/2019 FINAL  Final  SARS CORONAVIRUS 2 (TAT 6-24 HRS) Nasopharyngeal Nasopharyngeal Swab     Status: None   Collection Time: 11/13/19  9:42 PM   Specimen: Nasopharyngeal Swab  Result Value Ref Range Status   SARS Coronavirus 2 NEGATIVE NEGATIVE Final    Comment: (NOTE) SARS-CoV-2 target nucleic acids are NOT DETECTED.  The SARS-CoV-2 RNA is generally detectable in upper and lower respiratory specimens during the acute phase of infection. Negative results do not preclude SARS-CoV-2 infection, do not rule out co-infections with other pathogens, and should not be used as the sole basis for treatment or other patient management decisions. Negative results must be combined with clinical observations, patient history, and epidemiological information. The expected result is Negative.  Fact Sheet for Patients: SugarRoll.be  Fact Sheet for Healthcare  Providers: https://www.woods-mathews.com/  This test is not yet approved or cleared by the [place] FDA and  has been authorized for detection and/or diagnosis of SARS-CoV-2 by FDA under an Emergency Use Authorization (EUA). This EUA will remain  in effect (meaning this test can be used) for the duration of the COVID-19 declaration under Se ction 564(b)(1) of the Act, 21 U.S.C. section 360bbb-3(b)(1), unless the authorization is terminated or revoked sooner.  Performed at [facility], [place] [address] [place], [place] [address]      Labs: BNP (last 3 results) Recent Labs    11/07/19 0659  BNP 751.0*   Basic Metabolic Panel: Recent Labs  Lab 11/11/19 0234  NA 141  K 4.2  CL 102  CO2 28  GLUCOSE 131*  BUN 17  CREATININE 0.99  CALCIUM 10.2  MG 2.1  PHOS 3.4   Liver Function Tests: Recent Labs  Lab 11/11/19 0234  ALBUMIN 2.9*   No results for input(s): LIPASE, AMYLASE in the last 168 hours. No results for input(s): AMMONIA in the last 168 hours. CBC: Recent Labs  Lab 11/10/19 0448 11/11/19 0234  WBC 14.6* 14.5*  NEUTROABS 10.3* 11.4*  HGB 15.6 16.1  HCT 45.9 47.5  MCV 81.1 80.4  PLT 337 384   Cardiac Enzymes: No results for input(s): CKTOTAL, CKMB, CKMBINDEX, TROPONINI in the last 168 hours. BNP: Invalid input(s): POCBNP CBG: Recent Labs  Lab 11/14/19 0719 11/14/19 1220 11/14/19 1627 11/14/19 2057 11/15/19 0751  GLUCAP 110* 125* 141* 153* 92   D-Dimer No results for input(s): DDIMER in the last 72 hours. Hgb A1c No results for input(s): HGBA1C in the last 72 hours. Lipid Profile No results for input(s): CHOL, HDL, LDLCALC, TRIG, CHOLHDL, LDLDIRECT in the last 72 hours. Thyroid function studies No results for input(s): TSH, T4TOTAL, T3FREE, THYROIDAB in the last 72 hours.  Invalid input(s): FREET3 Anemia work up No results for input(s): VITAMINB12, FOLATE, FERRITIN, TIBC, IRON, RETICCTPCT in the last 72  hours. Urinalysis    Component Value Date/Time   COLORURINE AMBER (A) 11/07/2019 1344   APPEARANCEUR HAZY (A) 11/07/2019 1344   LABSPEC 1.020 11/07/2019 1344   PHURINE 5.0 11/07/2019 1344   GLUCOSEU NEGATIVE 11/07/2019 1344   HGBUR NEGATIVE 11/07/2019  [place] 11/07/2019 1344   [place] 11/07/2019 1344   PROTEINUR 30 (A) 11/07/2019 1344   UROBILINOGEN 0.2 06/22/2008 1708   NITRITE NEGATIVE 11/07/2019 1344   LEUKOCYTESUR SMALL (A) 11/07/2019 1344   Sepsis Labs Invalid input(s): PROCALCITONIN,  WBC,  LACTICIDVEN Microbiology Recent Results (from the past 240 hour(s))  SARS Coronavirus 2 by RT PCR (hospital order, performed in [facility] hospital lab) Nasopharyngeal Nasopharyngeal Swab     Status: None   Collection Time: 11/07/19  6:47 AM   Specimen: Nasopharyngeal Swab  Result Value Ref Range Status   SARS Coronavirus 2 NEGATIVE NEGATIVE Final    Comment: (NOTE) SARS-CoV-2 target nucleic acids are NOT DETECTED.  The SARS-CoV-2 RNA is generally detectable in upper and lower respiratory specimens during the acute phase of infection. The lowest concentration of SARS-CoV-2 viral copies this assay can detect is 250 copies / mL. A negative result does not preclude SARS-CoV-2 infection and should not be used as the sole basis for treatment or other patient management decisions.  A negative result may occur with improper specimen collection / handling, submission of specimen other than nasopharyngeal swab, presence of viral mutation(s) within the areas targeted by this assay, and inadequate number of viral copies (<250 copies / mL). A negative result must be combined with clinical observations, patient history, and epidemiological information.  Fact Sheet for Patients:   StrictlyIdeas.no  Fact Sheet for Healthcare Providers: BankingDealers.co.za  This test is not yet approved or  cleared by the [place] FDA and has been authorized for detection and/or diagnosis of SARS-CoV-2 by FDA under an Emergency Use Authorization (EUA).  This EUA will remain in effect (meaning this test can be used) for the duration of the COVID-19 declaration under Section 564(b)(1) of the Act, 21 U.S.C. section 360bbb-3(b)(1), unless the authorization is terminated or revoked sooner.  Performed at [facility], [place] [address], [place], [place] [address]   Culture, Urine     Status: None   Collection Time: 11/07/19  1:49 PM   Specimen: Urine, Random  Result Value Ref Range Status   Specimen Description URINE, RANDOM  Final   Special Requests NONE  Final   Culture   Final    NO GROWTH Performed at [facility], [place] [address], [place],  [address]    Report Status 11/08/2019 FINAL  Final  SARS CORONAVIRUS 2 (TAT 6-24 HRS) Nasopharyngeal Nasopharyngeal Swab     Status: None   Collection Time: 11/13/19  9:42 PM   Specimen: Nasopharyngeal Swab  Result Value Ref Range Status   SARS Coronavirus 2 NEGATIVE NEGATIVE Final    Comment: (NOTE) SARS-CoV-2 target nucleic acids are NOT DETECTED.  The SARS-CoV-2 RNA is generally detectable in upper and lower respiratory specimens during the acute phase of infection. Negative results do not preclude SARS-CoV-2 infection, do not rule out co-infections with other pathogens, and should not be used as the sole basis for treatment or other patient management decisions. Negative results must be combined with clinical observations, patient history, and epidemiological information. The expected result is Negative.  Fact Sheet for Patients: SugarRoll.be  Fact Sheet for Healthcare Providers: https://www.woods-mathews.com/  This test is not yet approved or cleared by the [place] FDA and  has been authorized for detection and/or diagnosis of SARS-CoV-2 by FDA under an Emergency Use Authorization (EUA).  This EUA will remain  in effect (meaning this test can be used) for the duration of the  COVID-19 declaration under Se ction 564(b)(1) of the Act, 21 U.S.C. section 360bbb-3(b)(1), unless the authorization is terminated or revoked sooner.  Performed at [facility], [place] [address] [place], [place] [address]      Time coordinating discharge: Over 30 minutes  SIGNED:   Charlynne Cousins, MD  [facility] 11/15/2019, 8:27 AM Pager   If 7PM-7AM, please contact night-coverage [email or web address] Password [email or web address]

## 2019-11-15 NOTE — Discharge Instructions (Signed)
Abdominal Bloating When you have abdominal bloating, your abdomen may feel full, tight, or painful. It may also look bigger than normal or swollen (distended). Common causes of abdominal bloating include:  Swallowing air.  Constipation.  Problems digesting food.  Eating too much.  Irritable bowel syndrome. This is a condition that affects the large intestine.  Lactose intolerance. This is an inability to digest lactose, a natural sugar in dairy products.  Celiac disease. This is a condition that affects the ability to digest gluten, a protein found in some grains.  Gastroparesis. This is a condition that slows down the movement of food in the stomach and small intestine. It is more common in people with diabetes mellitus.  Gastroesophageal reflux disease (GERD). This is a digestive condition that makes stomach acid flow back into the esophagus.  Urinary retention. This means that the body is holding onto urine, and the bladder cannot be emptied all the way. Follow these instructions at home: Eating and drinking  Avoid eating too much.  Try not to swallow air while talking or eating.  Avoid eating while lying down.  Avoid these foods and drinks: ? Foods that cause gas, such as broccoli, cabbage, cauliflower, and baked beans. ? Carbonated drinks. ? Hard candy. ? Chewing gum. Medicines  Take over-the-counter and prescription medicines only as told by your health care provider.  Take probiotic medicines. These medicines contain live bacteria or yeasts that can help digestion.  Take coated peppermint oil capsules. Activity  Try to exercise regularly. Exercise may help to relieve bloating that is caused by gas and relieve constipation. General instructions  Keep all follow-up visits as told by your health care provider. This is important. Contact a health care provider if:  You have nausea and vomiting.  You have diarrhea.  You have abdominal pain.  You have unusual  weight loss or weight gain.  You have severe pain, and medicines do not help. Get help right away if:  You have severe chest pain.  You have trouble breathing.  You have shortness of breath.  You have trouble urinating.  You have darker urine than normal.  You have blood in your stools or have dark, tarry stools. Summary  Abdominal bloating means that the abdomen is swollen.  Common causes of abdominal bloating are swallowing air, constipation, and problems digesting food.  Avoid eating too much and avoid swallowing air.  Avoid foods that cause gas, carbonated drinks, hard candy, and chewing gum. This information is not intended to replace advice given to you by your health care provider. Make sure you discuss any questions you have with your health care provider. Document Revised: 08/21/2018 Document Reviewed: 06/04/2016 Elsevier Patient Education  [place].   COPD and Physical Activity Chronic obstructive pulmonary disease (COPD) is a long-term (chronic) condition that affects the lungs. COPD is a general term that can be used to describe many different lung problems that cause lung swelling (inflammation) and limit airflow, including chronic bronchitis and emphysema. The main symptom of COPD is shortness of breath, which makes it harder to do even simple tasks. This can also make it harder to exercise and be active. Talk with your health care provider about treatments to help you breathe better and actions you can take to prevent breathing problems during physical activity. What are the benefits of exercising with COPD? Exercising regularly is an important part of a healthy lifestyle. You can still exercise and do physical activities even though you have COPD. Exercise  and physical activity improve your shortness of breath by increasing blood flow (circulation). This causes your heart to pump more oxygen through your body. Moderate exercise can improve your:  Oxygen  use.  Energy level.  Shortness of breath.  Strength in your breathing muscles.  Heart health.  Sleep.  Self-esteem and feelings of self-worth.  Depression, stress, and anxiety levels. Exercise can benefit everyone with COPD. The severity of your disease may affect how hard you can exercise, especially at first, but everyone can benefit. Talk with your health care provider about how much exercise is safe for you, and which activities and exercises are safe for you. What actions can I take to prevent breathing problems during physical activity?  Sign up for a pulmonary rehabilitation program. This type of program may include: ? Education about lung diseases. ? Exercise classes that teach you how to exercise and be more active while improving your breathing. This usually involves:  Exercise using your lower extremities, such as a stationary bicycle.  About 30 minutes of exercise, 2 to 5 times per week, for 6 to 12 weeks  Strength training, such as push ups or leg lifts. ? Nutrition education. ? Group classes in which you can talk with others who also have COPD and learn ways to manage stress.  If you use an oxygen tank, you should use it while you exercise. Work with your health care provider to adjust your oxygen for your physical activity. Your resting flow rate is different from your flow rate during physical activity.  While you are exercising: ? Take slow breaths. ? Pace yourself and do not try to go too fast. ? Purse your lips while breathing out. Pursing your lips is similar to a kissing or whistling position. ? If doing exercise that uses a quick burst of effort, such as weight lifting:  Breathe in before starting the exercise.  Breathe out during the hardest part of the exercise (such as raising the weights). Where to find support You can find support for exercising with COPD from:  Your health care provider.  A pulmonary rehabilitation program.  Your local  health department or community health programs.  Support groups, online or in-person. Your health care provider may be able to recommend support groups. Where to find more information You can find more information about exercising with COPD from:  American Lung Association: [employer]Ins[employer].se.  COPD Foundation: https://www.rivera.net/. Contact a health care provider if:  Your symptoms get worse.  You have chest pain.  You have nausea.  You have a fever.  You have trouble talking or catching your breath.  You want to start a new exercise program or a new activity. Summary  COPD is a general term that can be used to describe many different lung problems that cause lung swelling (inflammation) and limit airflow. This includes chronic bronchitis and emphysema.  Exercise and physical activity improve your shortness of breath by increasing blood flow (circulation). This causes your heart to provide more oxygen to your body.  Contact your health care provider before starting any exercise program or new activity. Ask your health care provider what exercises and activities are safe for you. This information is not intended to replace advice given to you by your health care provider. Make sure you discuss any questions you have with your health care provider. Document Revised: 08/23/2018 Document Reviewed: 05/26/2017 Elsevier Patient Education  [place].   Chronic Obstructive Pulmonary Disease  Chronic obstructive pulmonary disease (COPD) is a  long-term (chronic) condition that affects the lungs. COPD is a general term that can be used to describe many different lung problems that cause lung swelling (inflammation) and limit airflow, including chronic bronchitis and emphysema. If you have COPD, your lung function will probably never return to normal. In most cases, it gets worse over time. However, there are steps you can take to slow the progression of the disease and improve your quality of  life. What are the causes? This condition may be caused by:  Smoking. This is the most common cause.  Certain genes passed down through families. What increases the risk? The following factors may make you more likely to develop this condition:  Secondhand smoke from cigarettes, pipes, or cigars.  Exposure to chemicals and other irritants such as fumes and dust in the work environment.  Chronic lung conditions or infections. What are the signs or symptoms? Symptoms of this condition include:  Shortness of breath, especially during physical activity.  Chronic cough with a large amount of thick mucus. Sometimes the cough may not have any mucus (dry cough).  Wheezing.  Rapid breaths.  Gray or bluish discoloration (cyanosis) of the skin, especially in your fingers, toes, or lips.  Feeling tired (fatigue).  Weight loss.  Chest tightness.  Frequent infections.  Episodes when breathing symptoms become much worse (exacerbations).  Swelling in the ankles, feet, or legs. This may occur in later stages of the disease. How is this diagnosed? This condition is diagnosed based on:  Your medical history.  A physical exam. You may also have tests, including:  Lung (pulmonary) function tests. This may include a spirometry test, which measures your ability to exhale properly.  Chest X-ray.  CT scan.  Blood tests. How is this treated? This condition may be treated with:  Medicines. These may include inhaled rescue medicines to treat acute exacerbations as well as long-term, or maintenance, medicines to prevent flare-ups of COPD. ? Bronchodilators help treat COPD by dilating the airways to allow increased airflow and make your breathing more comfortable. ? Steroids can reduce airway inflammation and help prevent exacerbations.  Smoking cessation. If you smoke, your health care provider may ask you to quit, and may also recommend therapy or replacement products to help you  quit.  Pulmonary rehabilitation. This may involve working with a team of health care providers and specialists, such as respiratory, occupational, and physical therapists.  Exercise and physical activity. These are beneficial for nearly all people with COPD.  Nutrition therapy to gain weight, if you are underweight.  Oxygen. Supplemental oxygen therapy is only helpful if you have a low oxygen level in your blood (hypoxemia).  Lung surgery or transplant.  Palliative care. This is to help people with COPD feel comfortable when treatment is no longer working. Follow these instructions at home: Medicines  Take over-the-counter and prescription medicines (inhaled or pills) only as told by your health care provider.  Talk to your health care provider before taking any cough or allergy medicines. You may need to avoid certain medicines that dry out your airways. Lifestyle  If you are a smoker, the most important thing that you can do is to stop smoking. Do not use any products that contain nicotine or tobacco, such as cigarettes and e-cigarettes. If you need help quitting, ask your health care provider. Continuing to smoke will cause the disease to progress faster.  Avoid exposure to things that irritate your lungs, such as smoke, chemicals, and fumes.  Stay active, but  balance activity with periods of rest. Exercise and physical activity will help you maintain your ability to do things you want to do.  Learn and use relaxation techniques to manage stress and to control your breathing.  Get the right amount of sleep and get quality sleep. Most adults need 7 or more hours per night.  Eat healthy foods. Eating smaller, more frequent meals and resting before meals may help you maintain your strength. Controlled breathing Learn and use controlled breathing techniques as directed by your health care provider. Controlled breathing techniques include:  Pursed lip breathing. Start by breathing in  (inhaling) through your nose for 1 second. Then, purse your lips as if you were going to whistle and breathe out (exhale) through the pursed lips for 2 seconds.  Diaphragmatic breathing. Start by putting one hand on your abdomen just above your waist. Inhale slowly through your nose. The hand on your abdomen should move out. Then purse your lips and exhale slowly. You should be able to feel the hand on your abdomen moving in as you exhale. Controlled coughing Learn and use controlled coughing to clear mucus from your lungs. Controlled coughing is a series of short, progressive coughs. The steps of controlled coughing are: 1. Lean your head slightly forward. 2. Breathe in deeply using diaphragmatic breathing. 3. Try to hold your breath for 3 seconds. 4. Keep your mouth slightly open while coughing twice. 5. Spit any mucus out into a tissue. 6. Rest and repeat the steps once or twice as needed. General instructions  Make sure you receive all the vaccines that your health care provider recommends, especially the pneumococcal and influenza vaccines. Preventing infection and hospitalization is very important when you have COPD.  Use oxygen therapy and pulmonary rehabilitation if directed to by your health care provider. If you require home oxygen therapy, ask your health care provider whether you should purchase a pulse oximeter to measure your oxygen level at home.  Work with your health care provider to develop a COPD action plan. This will help you know what steps to take if your condition gets worse.  Keep other chronic health conditions under control as told by your health care provider.  Avoid extreme temperature and humidity changes.  Avoid contact with people who have an illness that spreads from person to person (is contagious), such as viral infections or pneumonia.  Keep all follow-up visits as told by your health care provider. This is important. Contact a health care provider  if:  You are coughing up more mucus than usual.  There is a change in the color or thickness of your mucus.  Your breathing is more labored than usual.  Your breathing is faster than usual.  You have difficulty sleeping.  You need to use your rescue medicines or inhalers more often than expected.  You have trouble doing routine activities such as getting dressed or walking around the house. Get help right away if:  You have shortness of breath while you are resting.  You have shortness of breath that prevents you from: ? Being able to talk. ? Performing your usual physical activities.  You have chest pain lasting longer than 5 minutes.  Your skin color is more blue (cyanotic) than usual.  You measure low oxygen saturations for longer than 5 minutes with a pulse oximeter.  You have a fever.  You feel too tired to breathe normally. Summary  Chronic obstructive pulmonary disease (COPD) is a long-term (chronic) condition that affects the  lungs.  Your lung function will probably never return to normal. In most cases, it gets worse over time. However, there are steps you can take to slow the progression of the disease and improve your quality of life.  Treatment for COPD may include taking medicines, quitting smoking, pulmonary rehabilitation, and changes to diet and exercise. As the disease progresses, you may need oxygen therapy, a lung transplant, or palliative care.  To help manage your condition, do not smoke, avoid exposure to things that irritate your lungs, stay up to date on all vaccines, and follow your health care provider's instructions for taking medicines. This information is not intended to replace advice given to you by your health care provider. Make sure you discuss any questions you have with your health care provider. Document Revised: 04/15/2017 Document Reviewed: 06/07/2016 Elsevier Patient Education  [place].   Acute Respiratory Failure,  Adult  Acute respiratory failure occurs when there is not enough oxygen passing from your lungs to your body. When this happens, your lungs have trouble removing carbon dioxide from the blood. This causes your blood oxygen level to drop too low as carbon dioxide builds up. Acute respiratory failure is a medical emergency. It can develop quickly, but it is temporary if treated promptly. Your lung capacity, or how much air your lungs can hold, may improve with time, exercise, and treatment. What are the causes? There are many possible causes of acute respiratory failure, including:  Lung injury.  Chest injury or damage to the ribs or tissues near the lungs.  Lung conditions that affect the flow of air and blood into and out of the lungs, such as pneumonia, acute respiratory distress syndrome, and cystic fibrosis.  Medical conditions, such as strokes or spinal cord injuries, that affect the muscles and nerves that control breathing.  Blood infection (sepsis).  Inflammation of the pancreas (pancreatitis).  A blood clot in the lungs (pulmonary embolism).  A large-volume blood transfusion.  Burns.  Near-drowning.  Seizure.  Smoke inhalation.  Reaction to medicines.  Alcohol or drug overdose. What increases the risk? This condition is more likely to develop in people who have:  A blocked airway.  Asthma.  A condition or disease that damages or weakens the muscles, nerves, bones, or tissues that are involved in breathing.  A serious infection.  A health problem that blocks the unconscious reflex that is involved in breathing, such as hypothyroidism or sleep apnea.  A lung injury or trauma. What are the signs or symptoms? Trouble breathing is the main symptom of acute respiratory failure. Symptoms may also include:  Rapid breathing.  Restlessness or anxiety.  Skin, lips, or fingernails that appear blue (cyanosis).  Rapid heart rate.  Abnormal heart rhythms  (arrhythmias).  Confusion or changes in behavior.  Tiredness or loss of energy.  Feeling sleepy or having a loss of consciousness. How is this diagnosed? Your health care provider can diagnose acute respiratory failure with a medical history and physical exam. During the exam, your health care provider will listen to your heart and check for crackling or wheezing sounds in your lungs. Your may also have tests to confirm the diagnosis and determine what is causing respiratory failure. These tests may include:  Measuring the amount of oxygen in your blood (pulse oximetry). The measurement comes from a small device that is placed on your finger, earlobe, or toe.  Other blood tests to measure blood gases and to look for signs of infection.  Sampling your  cerebral spinal fluid or tracheal fluid to check for infections.  Chest X-ray to look for fluid in spaces that should be filled with air.  Electrocardiogram (ECG) to look at the heart's electrical activity. How is this treated? Treatment for this condition usually takes places in a hospital intensive care unit (ICU). Treatment depends on what is causing the condition. It may include one or more treatments until your symptoms improve. Treatment may include:  Supplemental oxygen. Extra oxygen is given through a tube in the nose, a face mask, or a hood.  A device such as a continuous positive airway pressure (CPAP) or bi-level positive airway pressure (BiPAP or BPAP) machine. This treatment uses mild air pressure to keep the airways open. A mask or other device will be placed over your nose or mouth. A tube that is connected to a motor will deliver oxygen through the mask.  Ventilator. This treatment helps move air into and out of the lungs. This may be done with a bag and mask or a machine. For this treatment, a tube is placed in your windpipe (trachea) so air and oxygen can flow to the lungs.  Extracorporeal membrane oxygenation (ECMO). This  treatment temporarily takes over the function of the heart and lungs, supplying oxygen and removing carbon dioxide. ECMO gives the lungs a chance to recover. It may be used if a ventilator is not effective.  Tracheostomy. This is a procedure that creates a hole in the neck to insert a breathing tube.  Receiving fluids and medicines.  Rocking the bed to help breathing. Follow these instructions at home:  Take over-the-counter and prescription medicines only as told by your health care provider.  Return to normal activities as told by your health care provider. Ask your health care provider what activities are safe for you.  Keep all follow-up visits as told by your health care provider. This is important. How is this prevented? Treating infections and medical conditions that may lead to acute respiratory failure can help prevent the condition from developing. Contact a health care provider if:  You have a fever.  Your symptoms do not improve or they get worse. Get help right away if:  You are having trouble breathing.  You lose consciousness.  Your have cyanosis or turn blue.  You develop a rapid heart rate.  You are confused. These symptoms may represent a serious problem that is an emergency. Do not wait to see if the symptoms will go away. Get medical help right away. Call your local emergency services (911 in the [place] Do not drive yourself to the hospital. This information is not intended to replace advice given to you by your health care provider. Make sure you discuss any questions you have with your health care provider. Document Revised: 04/15/2017 Document Reviewed: 11/19/2015 Elsevier Patient Education  [place].   Chronic Obstructive Pulmonary Disease Chronic obstructive pulmonary disease (COPD) is a long-term (chronic) lung problem. When you have COPD, it is hard for air to get in and out of your lungs. Usually the condition gets worse over time, and your  lungs will never return to normal. There are things you can do to keep yourself as healthy as possible.  Your doctor may treat your condition with: ? Medicines. ? Oxygen. ? Lung surgery.  Your doctor may also recommend: ? Rehabilitation. This includes steps to make your body work better. It may involve a team of specialists. ? Quitting smoking, if you smoke. ? Exercise and changes  to your diet. ? Comfort measures (palliative care). Follow these instructions at home: Medicines  Take over-the-counter and prescription medicines only as told by your doctor.  Talk to your doctor before taking any cough or allergy medicines. You may need to avoid medicines that cause your lungs to be dry. Lifestyle  If you smoke, stop. Smoking makes the problem worse. If you need help quitting, ask your doctor.  Avoid being around things that make your breathing worse. This may include smoke, chemicals, and fumes.  Stay active, but remember to rest as well.  Learn and use tips on how to relax.  Make sure you get enough sleep. Most adults need at least 7 hours of sleep every night.  Eat healthy foods. Eat smaller meals more often. Rest before meals. Controlled breathing Learn and use tips on how to control your breathing as told by your doctor. Try:  Breathing in (inhaling) through your nose for 1 second. Then, pucker your lips and breath out (exhale) through your lips for 2 seconds.  Putting one hand on your belly (abdomen). Breathe in slowly through your nose for 1 second. Your hand on your belly should move out. Pucker your lips and breathe out slowly through your lips. Your hand on your belly should move in as you breathe out.  Controlled coughing Learn and use controlled coughing to clear mucus from your lungs. Follow these steps: 1. Lean your head a little forward. 2. Breathe in deeply. 3. Try to hold your breath for 3 seconds. 4. Keep your mouth slightly open while coughing 2  times. 5. Spit any mucus out into a tissue. 6. Rest and do the steps again 1 or 2 times as needed. General instructions  Make sure you get all the shots (vaccines) that your doctor recommends. Ask your doctor about a flu shot and a pneumonia shot.  Use oxygen therapy and pulmonary rehabilitation if told by your doctor. If you need home oxygen therapy, ask your doctor if you should buy a tool to measure your oxygen level (oximeter).  Make a COPD action plan with your doctor. This helps you to know what to do if you feel worse than usual.  Manage any other conditions you have as told by your doctor.  Avoid going outside when it is very hot, cold, or humid.  Avoid people who have a sickness you can catch (contagious).  Keep all follow-up visits as told by your doctor. This is important. Contact a doctor if:  You cough up more mucus than usual.  There is a change in the color or thickness of the mucus.  It is harder to breathe than usual.  Your breathing is faster than usual.  You have trouble sleeping.  You need to use your medicines more often than usual.  You have trouble doing your normal activities such as getting dressed or walking around the house. Get help right away if:  You have shortness of breath while resting.  You have shortness of breath that stops you from: ? Being able to talk. ? Doing normal activities.  Your chest hurts for longer than 5 minutes.  Your skin color is more blue than usual.  Your pulse oximeter shows that you have low oxygen for longer than 5 minutes.  You have a fever.  You feel too tired to breathe normally. Summary  Chronic obstructive pulmonary disease (COPD) is a long-term lung problem.  The way your lungs work will never return to normal. Usually the condition  gets worse over time. There are things you can do to keep yourself as healthy as possible.  Take over-the-counter and prescription medicines only as told by your  doctor.  If you smoke, stop. Smoking makes the problem worse. This information is not intended to replace advice given to you by your health care provider. Make sure you discuss any questions you have with your health care provider. Document Revised: 04/15/2017 Document Reviewed: 06/07/2016 Elsevier Patient Education  2020 [employer].   Chronic Obstructive Pulmonary Disease Exacerbation Chronic obstructive pulmonary disease (COPD) is a long-term (chronic) lung problem. In COPD, the flow of air from the lungs is limited. COPD exacerbations are times that breathing gets worse and you need more than your normal treatment. Without treatment, they can be life threatening. If they happen often, your lungs can become more damaged. If your COPD gets worse, your doctor may treat you with:  Medicines.  Oxygen.  Different ways to clear your airway, such as using a mask. Follow these instructions at home: Medicines  Take over-the-counter and prescription medicines only as told by your doctor.  If you take an antibiotic or steroid medicine, do not stop taking the medicine even if you start to feel better.  Keep up with shots (vaccinations) as told by your doctor. Be sure to get a yearly (annual) flu shot. Lifestyle  Do not smoke. If you need help quitting, ask your doctor.  Eat healthy foods.  Exercise regularly.  Get plenty of sleep.  Avoid tobacco smoke and other things that can bother your lungs.  Wash your hands often with soap and water. This will help keep you from getting an infection. If you cannot use soap and water, use hand sanitizer.  During flu season, avoid areas that are crowded with people. General instructions  Drink enough fluid to keep your pee (urine) clear or pale yellow. Do not do this if your doctor has told you not to.  Use a cool mist machine (vaporizer).  If you use oxygen or a machine that turns medicine into a mist (nebulizer), continue to use it as  told.  Follow all instructions for rehabilitation. These are steps you can take to make your body work better.  Keep all follow-up visits as told by your doctor. This is important. Contact a doctor if:  Your COPD symptoms get worse than normal. Get help right away if:  You are short of breath and it gets worse.  You have trouble talking.  You have chest pain.  You cough up blood.  You have a fever.  You keep throwing up (vomiting).  You feel weak or you pass out (faint).  You feel confused.  You are not able to sleep because of your symptoms.  You are not able to do daily activities. Summary  COPD exacerbations are times that breathing gets worse and you need more treatment than normal.  COPD exacerbations can be very serious and may cause your lungs to become more damaged.  Do not smoke. If you need help quitting, ask your doctor.  Stay up-to-date on your shots. Get a flu shot every year. This information is not intended to replace advice given to you by your health care provider. Make sure you discuss any questions you have with your health care provider. Document Revised: 04/15/2017 Document Reviewed: 06/07/2016 Elsevier Patient Education  2020 [employer].

## 2019-11-16 ENCOUNTER — Encounter: Payer: Self-pay | Admitting: Adult Health

## 2019-11-16 ENCOUNTER — Non-Acute Institutional Stay (SKILLED_NURSING_FACILITY): Payer: Medicare Other | Admitting: Adult Health

## 2019-11-16 DIAGNOSIS — J441 Chronic obstructive pulmonary disease with (acute) exacerbation: Secondary | ICD-10-CM | POA: Diagnosis not present

## 2019-11-16 DIAGNOSIS — F418 Other specified anxiety disorders: Secondary | ICD-10-CM

## 2019-11-16 DIAGNOSIS — I10 Essential (primary) hypertension: Secondary | ICD-10-CM

## 2019-11-16 DIAGNOSIS — J9621 Acute and chronic respiratory failure with hypoxia: Secondary | ICD-10-CM

## 2019-11-16 DIAGNOSIS — E1122 Type 2 diabetes mellitus with diabetic chronic kidney disease: Secondary | ICD-10-CM

## 2019-11-16 DIAGNOSIS — K2900 Acute gastritis without bleeding: Secondary | ICD-10-CM

## 2019-11-16 DIAGNOSIS — K5901 Slow transit constipation: Secondary | ICD-10-CM

## 2019-11-16 DIAGNOSIS — N182 Chronic kidney disease, stage 2 (mild): Secondary | ICD-10-CM

## 2019-11-16 DIAGNOSIS — I25119 Atherosclerotic heart disease of native coronary artery with unspecified angina pectoris: Secondary | ICD-10-CM

## 2019-11-16 DIAGNOSIS — E785 Hyperlipidemia, unspecified: Secondary | ICD-10-CM

## 2019-11-16 NOTE — Progress Notes (Addendum)
Location:  [facility] Room Number: 315-A Place of Service:  SNF (31) Provider:  Durenda Age, DNP, FNP-BC  Patient Care Team: Lucianne Lei, MD as PCP - General (Family Medicine)  Extended Emergency Contact Information Primary Emergency Contact: Okerlund,[name] Address: [address]          [place], [place] [address] [place] Phone: [phone] Mobile Phone: [phone] Relation: Spouse  Code Status:  FULL CODE  Goals of care: Advanced Directive information Advanced Directives 11/13/2019  Does Patient Have a Medical Advance Directive? No  Type of Advance Directive -  Would patient like information on creating a medical advance directive? No - Patient declined  Pre-existing out of facility DNR order (yellow form or pink MOST form) -  Some encounter information is confidential and restricted. Go to Review Flowsheets activity to see all data.     Chief Complaint  Patient presents with  . Acute Visit    Patient is seen for hospital followup, status post hospitalization at [facility] 6/23-7/1/ for acute on chronic respiratory failure with hypoxia    HPI:  Pt is a 69 y.o. male who was admitted to [facility] on 11/15/19 post [facility] hospitalization 11/07/19 to 11/15/19.  He has a PMH of COPD, diabetes mellitus, CAD, hyperlipidemia, hypertension and OSA.  He uses O2 at 3 L/minute at home and was complaining of shortness of breath with O2 sat in the 80s, cough and wheezing when he went to the ED. he was a started on BiPAP.  Chest x-ray showed increase in mild left basilar lung airspace disease and was started on IV empiric antibiotics, Solu-Medrol and magnesium. He completed course of antibiotics for community acquired pneumonia. KUB showed possible outlet obstruction, NG tube was placed with intermittent suction.GI was consulted and performed EGD on 11/12/2019.  EGD revealed mild esophagitis with gastritis.  No gastric outlet  obstruction noted. He was  started on IV Protonix and NG tube was discontinued.  Multiple soapsuds enema and laxatives were given for his significant colonic fecal burden. He developed significant tachycardia with heart rate of 120 bpm.  Atenolol was apparently on hold.  He was given 500 mL normal saline and atenolol was restarted.  Heart rate went back to normal. Of note, colonoscopy done on 07/09/2017 showed nonbleeding transverse colon AVMs and some diverticula.  On 08/30/2014, his colonoscopy was positive for 3 small adenomas.  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  He was seen in his room today. No noted SOB while on O2 at 4L/min. He stated that he has smoked a pack of cigarette a day since he was 69 years old.  Past Medical History:  Diagnosis Date  . Anxiety   . Arthritis   . COPD (chronic obstructive pulmonary disease) ([place])   . Coronary artery disease    x4  . Depression   . Diabetes mellitus without complication ([place])   . GERD (gastroesophageal reflux disease)    tums will relieve  . HLD (hyperlipidemia)   . HTN (hypertension)   . Obstructive sleep apnea    sleep study 2006, uses CPAP, pt does not know cpap settings  . On home oxygen therapy    2L via nasal cannula prn  . Shortness of breath    exertion   Past Surgical History:  Procedure Laterality Date  . CARDIAC CATHETERIZATION  2003,2010   x4   . COLONOSCOPY WITH PROPOFOL N/A 08/30/2014   Procedure: COLONOSCOPY WITH PROPOFOL;  Surgeon: Carol Ada, MD;  Location: [facility] ENDOSCOPY;  Service: Endoscopy;  Laterality: N/A;  h&p in file   . COLONOSCOPY WITH PROPOFOL N/A 07/08/2017   Procedure: COLONOSCOPY WITH PROPOFOL;  Surgeon: Carol Ada, MD;  Location: [facility] ENDOSCOPY;  Service: Endoscopy;  Laterality: N/A;  . CORONARY ANGIOPLASTY     stents x 4  . DENTAL SURGERY    . ESOPHAGOGASTRODUODENOSCOPY (EGD) WITH PROPOFOL N/A 11/12/2019   Procedure: ESOPHAGOGASTRODUODENOSCOPY (EGD) WITH PROPOFOL;  Surgeon: Carol Ada,  MD;  Location: [facility];  Service: Endoscopy;  Laterality: N/A;  . TOTAL HIP ARTHROPLASTY  04/06/2012   Procedure: TOTAL HIP ARTHROPLASTY;  Surgeon: Sharmon Revere, MD;  Location: [facility];  Service: Orthopedics;  Laterality: Right;  . TOTAL HIP ARTHROPLASTY Left 04/03/2013   Procedure: LEFT TOTAL HIP ARTHROPLASTY;  Surgeon: Sharmon Revere, MD;  Location: [facility];  Service: Orthopedics;  Laterality: Left;    No Known Allergies  Outpatient Encounter Medications as of 11/16/2019  Medication Sig  . albuterol (PROAIR HFA) 108 (90 Base) MCG/ACT inhaler 2 puffs every 4 hours as needed only  if your can't catch your breath  . amLODipine (NORVASC) 5 MG tablet Take 5 mg by mouth at bedtime.   Marland Kitchen aspirin EC 81 MG tablet Take 81 mg by mouth at bedtime.  Marland Kitchen atenolol (TENORMIN) 25 MG tablet Take 25 mg by mouth at bedtime.   . budesonide-formoterol (SYMBICORT) 160-4.5 MCG/ACT inhaler Inhale 2 puffs into the lungs 2 (two) times daily.  . busPIRone (BUSPAR) 5 MG tablet Take 1 tablet (5 mg total) by mouth 3 (three) times daily.  . clopidogrel (PLAVIX) 75 MG tablet Take 75 mg by mouth at bedtime.   . Coenzyme Q10 (COQ10) 100 MG CAPS Take 1 capsule by mouth at bedtime.   . metFORMIN (GLUCOPHAGE) 500 MG tablet Take 1,000 mg by mouth at bedtime. 2 tabs by mouth once daily at bedtime  . mirtazapine (REMERON) 45 MG tablet Take 1 tablet (45 mg total) by mouth at bedtime.  . Multiple Vitamin (MULTIVITAMIN WITH MINERALS) TABS Take 1 tablet by mouth at bedtime.   . nitroGLYCERIN (NITROSTAT) 0.4 MG SL tablet Place 1 tablet (0.4 mg total) under the tongue every 5 (five) minutes as needed for chest pain.  . OXYGEN Inhale 4 L into the lungs continuous. With exertion only   . pantoprazole (PROTONIX) 40 MG tablet Take 1 tablet (40 mg total) by mouth daily.  . pravastatin (PRAVACHOL) 40 MG tablet Take 40 mg by mouth daily. Taking at bedtime  . predniSONE (DELTASONE) 10 MG tablet Takes 2 tabs for 1 days, then 1 tab for 1 days,  and then stop.  . sodium chloride (OCEAN) 0.65 % SOLN nasal spray Place 2 sprays into both nostrils as needed for congestion.  . Tiotropium Bromide Monohydrate (SPIRIVA RESPIMAT) 2.5 MCG/ACT AERS Inhale 2 puffs into the lungs daily.  . vitamin B-12 (CYANOCOBALAMIN) 1000 MCG tablet Take 1,000 mcg by mouth daily.   . [DISCONTINUED] omega-3 acid ethyl esters (LOVAZA) 1 g capsule Take by mouth.   . [DISCONTINUED] Omega-3 Fatty Acids (FISH OIL) 1000 MG CAPS Take 1,000 mg by mouth daily.  . [DISCONTINUED] sildenafil (VIAGRA) 25 MG tablet Take 25 mg by mouth See admin instructions. Use as directed  . [DISCONTINUED] testosterone cypionate (DEPOTESTOSTERONE CYPIONATE) 200 MG/ML injection Inject 200 mg into the muscle every 21 ( twenty-one) days.    No facility-administered encounter medications on file as of 11/16/2019.    Review of Systems  GENERAL:  No change in appetite, no fatigue, no weight changes, no fever, chills or weakness MOUTH and THROAT: Denies oral discomfort, gingival pain or bleeding, pain from teeth or hoarseness   RESPIRATORY: + cough CARDIAC: No chest pain, edema or palpitations GI: No abdominal pain, diarrhea, constipation, heart burn, nausea or vomiting GU: Denies dysuria, frequency, hematuria or discharge NEUROLOGICAL: Denies dizziness, syncope, numbness, or headache PSYCHIATRIC: Denies feelings of depression or anxiety. No report of hallucinations, insomnia, paranoia, or agitation   Immunization History  Administered Date(s) Administered  . Influenza Split 02/14/2013, 02/15/2015  . Influenza, High Dose Seasonal PF 02/10/2016, 03/16/2018  . Influenza,inj,Quad PF,6+ Mos 01/28/2014  . Influenza-Unspecified 01/16/2015, 03/17/2018  . Pneumococcal Conjugate-13 02/10/2016, 03/01/2017  . Pneumococcal-Unspecified 05/17/2013  . Zoster Recombinat (Shingrix) 09/16/2016, 02/19/2017   Pertinent  Health Maintenance Due  Topic Date Due  . FOOT EXAM  Never done  . OPHTHALMOLOGY EXAM   Never done  . URINE MICROALBUMIN  Never done  . PNA vac Low Risk Adult (2 of 2 - PPSV23) 05/17/2018  . INFLUENZA VACCINE  12/16/2019  . HEMOGLOBIN A1C  05/08/2020  . COLONOSCOPY  07/09/2027    Vitals:   11/16/19 0845  BP: 110/80  Pulse: 92  Resp: 18  Temp: 99 F (37.2 C)  TempSrc: Oral  SpO2: 96%  Weight: 170 lb (77.1 kg)  Height: 5\' 10"  (1.778 m)   Body mass index is 24.39 kg/m.  Physical Exam  GENERAL APPEARANCE: Well nourished. In no acute distress. Normal body habitus SKIN:  Skin is warm and dry.  MOUTH and THROAT: Lips are without lesions. Oral mucosa is moist and without lesions. Tongue is normal in shape, size, and color and without lesions RESPIRATORY: Breathing is even & unlabored, BS CTAB CARDIAC: RRR, no murmur,no extra heart sounds, no edema GI: Abdomen soft, normal BS, no masses, no tenderness EXTREMITIES:  Able to move X 4 extremities NEUROLOGICAL: There is no tremor. Speech is clear. Alert and oriented X 3. PSYCHIATRIC:  Affect and behavior are appropriate   Labs reviewed: Recent Labs    11/07/19 0659 11/07/19 0659 11/07/19 0711 11/08/19 0410 11/11/19 0234  NA 145   < > 146* 140 141  K 4.3   < > 4.1 3.8 4.2  CL 106  --   --  99 102  CO2 26  --   --  27 28  GLUCOSE 153*  --   --  146* 131*  BUN 17  --   --  19 17  CREATININE 1.00  --   --  0.88 0.99  CALCIUM 10.4*  --   --  9.1 10.2  MG  --   --   --   --  2.1  PHOS  --   --   --   --  3.4   < > = values in this interval not displayed.   Recent Labs    08/09/19 1546 11/11/19 0234  AST 28  --   ALT 20  --   ALKPHOS 45  --   BILITOT 1.4*  --   PROT 7.9  --   ALBUMIN 4.0 2.9*   Recent Labs    08/09/19 1546 11/07/19 0659 11/08/19 0410 11/10/19 0448 11/11/19 0234  WBC 7.3   < > 16.7* 14.6* 14.5*  NEUTROABS 5.3  --   --  10.3* 11.4*  HGB 15.9   < > 14.6 15.6 16.1  HCT 47.9   < > 43.6 45.9 47.5  MCV 83.3   < >  82.0 81.1 80.4  PLT 378   < > 270 337 384   < > = values in this  interval not displayed.   Lab Results  Component Value Date   TSH 1.958  12/07/2008   Lab Results  Component Value Date   HGBA1C 6.1 (H) 11/07/2019   Lab Results  Component Value Date   CHOL  06/23/2008    174        ATP III CLASSIFICATION:  <200     mg/dL   Desirable  200-239  mg/dL   Borderline High  >=240    mg/dL   High          HDL 46 06/23/2008   LDLCALC (H) 06/23/2008    120        Total Cholesterol/HDL:CHD Risk Coronary Heart Disease Risk Table                     Men   Women  1/2 Average Risk   3.4   3.3  Average Risk       5.0   4.4  2 X Average Risk   9.6   7.1  3 X Average Risk  23.4   11.0        Use the calculated Patient Ratio above and the CHD Risk Table to determine the patient's CHD Risk.        ATP III CLASSIFICATION (LDL):  <100     mg/dL   Optimal  100-129  mg/dL   Near or Above                    Optimal  130-159  mg/dL   Borderline  160-189  mg/dL   High  >190     mg/dL   Very High   TRIG 41 06/23/2008   CHOLHDL 3.8 06/23/2008    Significant Diagnostic Results in last 30 days:  DG Abd 1 View  Result Date: 11/10/2019 CLINICAL DATA:  Abdominal distension with epigastric pain. EXAM: ABDOMEN - 1 VIEW COMPARISON:  None. FINDINGS: Moderate gaseous distention of the stomach. This may be the result of a degree of gastric outlet obstruction. Remainder of the bowel gas pattern is nonobstructive with mild fecal retention over the right colon. No free peritoneal air. Mild degenerative change of the spine. Bilateral hip arthroplasties. IMPRESSION: Moderate gastric distention of the stomach which could be due to a degree of gastric outlet obstruction. Electronically Signed   By: Marin Olp M.D.   On: 11/10/2019 12:31   DG Chest Port 1 View  Result Date: 11/07/2019 CLINICAL DATA:  COPD. EXAM: PORTABLE CHEST 1 VIEW COMPARISON:  None. FINDINGS: Heart size normal. Atherosclerotic changes are noted in the aorta. Mild airspace opacities at the left base have  increased slightly. IMPRESSION: Slight increase in mild left basilar airspace disease. While this may represent atelectasis, infection is not excluded. Electronically Signed   By: San Morelle M.D.   On: 11/07/2019 07:43   DG Abd Portable 1V  Result Date: 11/10/2019 CLINICAL DATA:  Heartburn. EXAM: PORTABLE ABDOMEN - 1 VIEW COMPARISON:  11/10/2019 FINDINGS: Interval placement of a nasogastric tube without the previously demonstrated gaseous distention of the stomach. Normal bowel gas pattern. Prominent stool in the right colon. Left hip prosthesis. IMPRESSION: 1. Nasogastric tube in the stomach. 2. Prominent stool in the right colon. Electronically Signed   By: Claudie Revering M.D.   On: 11/10/2019 18:38    Assessment/Plan  1. Acute on  chronic respiratory failure with hypoxia (HCC) -   Continue O2 @ 4L/min via .and PRN albuterol  2. COPD exacerbation (HCC) - no wheezing, continue Prednisone taper, Spiriva and budesonide-formoterol - will start on Mucinex ER 600 mg Q 12 hours -Completed antibiotic course   3. Type 2 diabetes mellitus with stage 2 chronic kidney disease, without long-term current use of insulin (HCC) Lab Results  Component Value Date   HGBA1C 6.1 (H) 11/07/2019   -Continue Metformin 1000 mg at bedtime -Check CBGs  4. Essential hypertension -  Continue atenolol and amlodipine -   Monitor CBGs  5. Hyperlipidemia, unspecified hyperlipidemia type -Continue Pravastatin  6. Coronary artery disease involving native coronary artery of native heart with angina pectoris ([place]) -Denies chest pains, continue Plavix, aspirin, PRN NTG and pravastatin  7. Depression with anxiety -Continue Remeron and buspirone  8. Acute gastritis without hemorrhage, unspecified gastritis type -Was given IV Protonix, then transitioned to oral Protonix  9. Slow transit constipation -Multiple soapsuds enemas and laxatives were given in the hospital -Now resolved    Family/ staff  Communication:  Discussed plan of care with resident and charge nurse.  Labs/tests ordered:   None  Goals of care:   Short-term care   Durenda Age, DNP, FNP-BC [facility] [phone] (Monday-Friday 8:00 a.m. - 5:00 p.m.) [phone] (after hours)

## 2019-11-19 ENCOUNTER — Telehealth: Payer: Self-pay | Admitting: Dermatology

## 2019-11-19 NOTE — Telephone Encounter (Signed)
appointment

## 2019-11-19 NOTE — Telephone Encounter (Signed)
Patient is calling to schedule a referral appointment.  Patient is scheduled for 03/05/2020 @ 2:00 with Tressie Ellis Tafeen,MD.

## 2019-11-20 ENCOUNTER — Non-Acute Institutional Stay (SKILLED_NURSING_FACILITY): Payer: Medicare Other | Admitting: Internal Medicine

## 2019-11-20 ENCOUNTER — Encounter: Payer: Self-pay | Admitting: Internal Medicine

## 2019-11-20 DIAGNOSIS — R4189 Other symptoms and signs involving cognitive functions and awareness: Secondary | ICD-10-CM

## 2019-11-20 DIAGNOSIS — J9621 Acute and chronic respiratory failure with hypoxia: Secondary | ICD-10-CM

## 2019-11-20 DIAGNOSIS — K2970 Gastritis, unspecified, without bleeding: Secondary | ICD-10-CM

## 2019-11-20 DIAGNOSIS — K5900 Constipation, unspecified: Secondary | ICD-10-CM

## 2019-11-20 DIAGNOSIS — E1151 Type 2 diabetes mellitus with diabetic peripheral angiopathy without gangrene: Secondary | ICD-10-CM | POA: Diagnosis not present

## 2019-11-20 DIAGNOSIS — R29818 Other symptoms and signs involving the nervous system: Secondary | ICD-10-CM | POA: Insufficient documentation

## 2019-11-20 LAB — HEMOGLOBIN A1C: Hemoglobin A1C: 5.8

## 2019-11-20 NOTE — Patient Instructions (Signed)
See assessment and plan under each diagnosis in the problem list and acutely for this visit 

## 2019-11-20 NOTE — Progress Notes (Signed)
NURSING HOME LOCATION:  [facility] ROOM NUMBER:  [id number]  CODE STATUS:  FULL CODE  PCP:  Lucianne Lei, MD  [address] [place] [address]   This is a comprehensive admission note to [facility] performed on this date less than 30 days from date of admission. Included are preadmission medical/surgical history; reconciled medication list; family history; social history and comprehensive review of systems.  Corrections and additions to the records were documented. Comprehensive physical exam was also performed. Additionally a clinical summary was entered for each active diagnosis pertinent to this admission in the Problem List to enhance continuity of care.  HPI: He was hospitalized 6/23-11/15/2019 admitted from home with dyspnea and marked hypoxia.  O2 sats were in the 80+ % range prompting initiation of BiPAP.  Imaging suggested a slight increase in the mid-left basilar lung fields. Empiric IV antibiotics and Solu-Medrol were initiated.  Acute on chronic respiratory failure with hypoxia due to COPD and CAP was diagnosised. NG tube was placed and intermittent suction initiated as per GI for possible gastric outlet obstruction noted on KUB.  EGD revealed mild esophagitis with gastritis.  IV Protonix was initiated and NG tube discontinued when oral intake was adequate. Significant colonic fecal burden was present for which multiple soapsuds enemas and laxatives were administered with resolution.  A1c was noted to be 6.1%; no change was made in his 1000 g of Metformin at bedtime. Psychiatry follows the patient as an outpatient for depression; Remeron and BuSpar were continued. Oral steroids were being tapered at discharge. PT/OT recommended SNF.  Initially the patient requested home health but subsequently agreed to discharge to this facility @ his wife's urging.  Past medical and surgical history: Includes OSA, testosterone deficiency, essential hypertension, dyslipidemia,  GERD, diabetes with vascular complications, CAD, COPD, and anxiety. Surgeries and procedures include coronary angioplasty with 4 stents and total hip arthroplasty x2.  Social history: Nondrinker.  He has used marijuana.  He is a former cigarette smoker but now employs e-cigarettes.  Family history: Reviewed   Review of systems: PT/OT staff state that he manipulates his nasal flow oxygen which is not to exceed 4 L up to levels of 6 and 8 L/min. He states that he does not go above 4.5 L.  He has an O2 sat monitor hanging around his neck at all times. He validates that his breathing is better.  He also has decreased dyspepsia.  The dysphagia has also improved.  His biggest complaint is having to lie in what he feels is an uncomfortable bed.  He states that he sore all over from his shoulders to his legs.  He insists that he would do better at home focusing on " increasing my stamina".   He describes some urinary urgency since the apparent UTI while hospitalized.  He also has constipation.  Constitutional: No fever, significant weight change Eyes: No redness, discharge, pain, vision change ENT/mouth: No nasal congestion, purulent discharge, earache, change in hearing, sore throat  Cardiovascular: No chest pain, palpitations, paroxysmal nocturnal dyspnea, edema  Respiratory: No hemoptysis Gastrointestinal: No  abdominal pain, nausea /vomiting, rectal bleeding, melena Genitourinary: No dysuria, hematuria, pyuria Dermatologic: No rash, pruritus, change in appearance of skin Neurologic: No dizziness, headache, syncope, seizures, numbness, tingling Psychiatric: No significant  anorexia Endocrine: No change in hair/skin/nails, excessive thirst, excessive hunger, excessive urination  Hematologic/lymphatic: No significant bruising, lymphadenopathy, abnormal bleeding Allergy/immunology: No itchy/watery eyes, significant sneezing, urticaria, angioedema  Physical exam:  Pertinent or positive  findings:  Pattern alopecia is present.  He has a thin goatee.  Heart sounds are distant and difficult to auscultate.  He has diffuse low-grade expiratory rhonchi.  Pedal pulses are decreased.  Clubbing the nailbeds is present.  Strength and opposition is fair.  He has exfoliative changes of his feet.  General appearance: Adequately nourished; no acute distress, increased work of breathing is present.   Lymphatic: No lymphadenopathy about the head, neck, axilla. Eyes: No conjunctival inflammation or lid edema is present. There is no scleral icterus. Ears:  External ear exam shows no significant lesions or deformities.   Nose:  External nasal examination shows no deformity or inflammation. Nasal mucosa are pink and moist without lesions, exudates Oral exam: Lips and gums are healthy appearing.There is no oropharyngeal erythema or exudate. Neck:  No thyromegaly, masses, tenderness noted.    Heart:  No definite gallop, murmur, click, rub.  Lungs:  without wheezes,rales, rubs. Abdomen: Bowel sounds are normal.  Abdomen is soft and nontender with no organomegaly, hernias, masses. GU: Deferred  Extremities:  No cyanosis, edema. Neurologic exam:  Strength equal  in upper & lower extremities. Balance, Rhomberg, finger to nose testing could not be completed due to clinical state Skin: Warm & dry w/o tenting. No significant lesions or rash.  See clinical summary under each active problem in the Problem List with associated updated therapeutic plan

## 2019-11-20 NOTE — Assessment & Plan Note (Signed)
Psychiatry will be notified of these results.

## 2019-11-20 NOTE — Assessment & Plan Note (Signed)
A1c 6.1%.  Metformin decreased to 500 mg after evening meal.

## 2019-11-20 NOTE — Assessment & Plan Note (Signed)
The triggers for reflux were discussed with the patient.  These include stress; the "aspirin family" ; alcohol; peppermint; and caffeine (coffee, tea, cola, and chocolate). The aspirin family would include aspirin and the nonsteroidal agents such as ibuprofen &  Naproxen. Tylenol would not cause reflux. If having symptoms ; food & drink should be avoided for @ least 2 hours before going to bed.

## 2019-11-20 NOTE — Assessment & Plan Note (Addendum)
Staff reports patient manipulating nasal liter flow up to 8 L/min.  Risk discussed with patient. Continue steroid wean.

## 2019-11-27 ENCOUNTER — Encounter: Payer: Self-pay | Admitting: Adult Health

## 2019-11-27 ENCOUNTER — Non-Acute Institutional Stay (SKILLED_NURSING_FACILITY): Payer: Medicare Other | Admitting: Adult Health

## 2019-11-27 DIAGNOSIS — J449 Chronic obstructive pulmonary disease, unspecified: Secondary | ICD-10-CM

## 2019-11-27 DIAGNOSIS — E1151 Type 2 diabetes mellitus with diabetic peripheral angiopathy without gangrene: Secondary | ICD-10-CM

## 2019-11-27 DIAGNOSIS — R300 Dysuria: Secondary | ICD-10-CM | POA: Diagnosis not present

## 2019-11-27 DIAGNOSIS — F418 Other specified anxiety disorders: Secondary | ICD-10-CM

## 2019-11-27 DIAGNOSIS — J9611 Chronic respiratory failure with hypoxia: Secondary | ICD-10-CM | POA: Diagnosis not present

## 2019-11-27 NOTE — Progress Notes (Signed)
Location:  [place] Room Number: 841-L Place of Service:  SNF (31) Provider:  Durenda Age, DNP, MSN, FNP-BC  Patient Care Team: Lucianne Lei, MD as PCP - General (Family Medicine)  Extended Emergency Contact Information Primary Emergency Contact: Furgason,[name] Address: [address]          [place], [place] [address] [place] Phone: [phone] Mobile Phone: [phone] Relation: Spouse  Code Status:  FULL CODE  Goals of care: Advanced Directive information Advanced Directives 11/13/2019  Does Patient Have a Medical Advance Directive? No  Type of Advance Directive -  Would patient like information on creating a medical advance directive? No - Patient declined  Pre-existing out of facility DNR order (yellow form or pink MOST form) -  Some encounter information is confidential and restricted. Go to Review Flowsheets activity to see all data.     Chief Complaint  Patient presents with  . Medical Management of Chronic Issues    Routine short-term rehabilitation SNF visit    HPI:  Pt is a 69 y.o. male seen today for medical management of chronic diseases.  He is a short-term care resident of [facility].  He has a PMH of diabetes mellitus, CAD, COPD, hyperlipidemia, hypertension and OSA. He was seen in his room today with wife on the phone regarding medical concerns.  He complains of having dysuria, denies hematuria nor fever.  According to him, he was taking BuSpar 5 mg BID at home and was wondering why he takes it TID now.  He denies having anxiety at this time.  CBGs has been ranging from 77-155.  Currently, he is taking Metformin 500 mg daily. Latest hgbA1c 5.8 (11/20/19).  He was admitted to [facility] on 11/15/19 post [facility] hospitalization 11/07/2019 to 11/15/19.  He has a PMH of COPD, diabetes mellitus, CAD, hyperlipidemia, hypertension and OSA.  He was treated in his recent  hospitalization for acute respiratory failure with hypoxia, COPD exacerbation and acute gastritis.   Past Medical History:  Diagnosis Date  . Anxiety   . Arthritis   . COPD (chronic obstructive pulmonary disease) ([place])   . Coronary artery disease    x4  . Depression   . Diabetes mellitus without complication ([place])   . Gastritis    6/23-11/15/2019 gastric outlet obstruction suggested on KUB.  EGD revealed gastritis and esophagitis  . GERD (gastroesophageal reflux disease)    tums will relieve  . HLD (hyperlipidemia)   . HTN (hypertension)   . Obstructive sleep apnea    sleep study 2006, uses CPAP, pt does not know cpap settings  . On home oxygen therapy    2L via nasal cannula prn  . Shortness of breath    exertion   Past Surgical History:  Procedure Laterality Date  . CARDIAC CATHETERIZATION  2003,2010   x4   . COLONOSCOPY WITH PROPOFOL N/A 08/30/2014   Procedure: COLONOSCOPY WITH PROPOFOL;  Surgeon: Carol Ada, MD;  Location: [place];  Service: Endoscopy;  Laterality: N/A;  h&p in file   . COLONOSCOPY WITH PROPOFOL N/A 07/08/2017   Procedure: COLONOSCOPY WITH PROPOFOL;  Surgeon: Carol Ada, MD;  Location: [facility];  Service: Endoscopy;  Laterality: N/A;  . CORONARY ANGIOPLASTY     stents x 4  . DENTAL SURGERY    . ESOPHAGOGASTRODUODENOSCOPY (EGD) WITH PROPOFOL N/A 11/12/2019   Procedure: ESOPHAGOGASTRODUODENOSCOPY (EGD) WITH PROPOFOL;  Surgeon: Carol Ada, MD;  Location: [place];  Service: Endoscopy;  Laterality: N/A;  . TOTAL HIP ARTHROPLASTY  04/06/2012   Procedure: TOTAL HIP ARTHROPLASTY;  Surgeon: Sharmon Revere, MD;  Location: [place];  Service: Orthopedics;  Laterality: Right;  . TOTAL HIP ARTHROPLASTY Left 04/03/2013   Procedure: LEFT TOTAL HIP ARTHROPLASTY;  Surgeon: Sharmon Revere, MD;  Location: [place];  Service: Orthopedics;  Laterality: Left;    No Known Allergies  Outpatient Encounter Medications as of 11/27/2019  Medication Sig  .  acetaminophen (TYLENOL) 325 MG tablet Take 650 mg by mouth every 6 (six) hours as needed.  Marland Kitchen albuterol (PROAIR HFA) 108 (90 Base) MCG/ACT inhaler 2 puffs every 4 hours as needed only  if your can't catch your breath  . amLODipine (NORVASC) 5 MG tablet Take 5 mg by mouth at bedtime.   Marland Kitchen aspirin EC 81 MG tablet Take 81 mg by mouth at bedtime.  Marland Kitchen atenolol (TENORMIN) 25 MG tablet Take 25 mg by mouth at bedtime.   . budesonide-formoterol (SYMBICORT) 160-4.5 MCG/ACT inhaler Inhale 2 puffs into the lungs 2 (two) times daily.  . busPIRone (BUSPAR) 5 MG tablet Take 1 tablet (5 mg total) by mouth 3 (three) times daily.  . clopidogrel (PLAVIX) 75 MG tablet Take 75 mg by mouth at bedtime.   . Coenzyme Q10 (COQ10) 100 MG CAPS Take 1 capsule by mouth at bedtime.   . feeding supplement, ENSURE ENLIVE, (ENSURE ENLIVE) LIQD Take 237 mLs by mouth daily.  Marland Kitchen guaiFENesin (MUCINEX) 600 MG 12 hr tablet Take 600 mg by mouth 2 (two) times daily.  . metFORMIN (GLUMETZA) 500 MG (MOD) 24 hr tablet Take 500 mg by mouth every evening. After meal  . mirtazapine (REMERON) 45 MG tablet Take 1 tablet (45 mg total) by mouth at bedtime.  . Multiple Vitamin (MULTIVITAMIN WITH MINERALS) TABS Take 1 tablet by mouth at bedtime.   . nitroGLYCERIN (NITROSTAT) 0.4 MG SL tablet Place 1 tablet (0.4 mg total) under the tongue every 5 (five) minutes as needed for chest pain.  . Nutritional Supplements (ENSURE CLEAR) LIQD Take 1 each by mouth daily.  . OXYGEN Inhale 4 L into the lungs continuous. With exertion only   . pantoprazole (PROTONIX) 40 MG tablet Take 1 tablet (40 mg total) by mouth daily.  . pravastatin (PRAVACHOL) 40 MG tablet Take 40 mg by mouth daily. Taking at bedtime  . sennosides-docusate sodium (SENOKOT-S) 8.6-50 MG tablet Take 1 tablet by mouth daily.  . sodium chloride (OCEAN) 0.65 % SOLN nasal spray Place 2 sprays into both nostrils as needed for congestion.  . Tiotropium Bromide Monohydrate (SPIRIVA RESPIMAT) 2.5 MCG/ACT  AERS Inhale 2 puffs into the lungs daily.  . vitamin B-12 (CYANOCOBALAMIN) 1000 MCG tablet Take 1,000 mcg by mouth daily.   . [DISCONTINUED] metFORMIN (GLUCOPHAGE) 1000 MG tablet Take 1,000 mg by mouth at bedtime.   No facility-administered encounter medications on file as of 11/27/2019.    Review of Systems  GENERAL: No change in appetite, no fatigue, no weight changes, no fever, chills or weakness MOUTH and THROAT: Denies oral discomfort, gingival pain or bleeding RESPIRATORY: no cough, SOB, DOE, continue wheezing, hemoptysis CARDIAC: No chest pain, edema or palpitations GI: No abdominal pain, diarrhea, constipation, heart burn, nausea or vomiting GU: +dysuria NEUROLOGICAL: Denies dizziness, syncope, numbness, or headache PSYCHIATRIC: Denies feelings of depression or anxiety. No report of hallucinations, insomnia, paranoia, or agitation   Immunization History  Administered Date(s) Administered  . Influenza Split 02/14/2013, 02/15/2015  . Influenza, High Dose Seasonal PF 02/10/2016, 03/16/2018  .  Influenza,inj,Quad PF,6+ Mos 01/28/2014  . Influenza-Unspecified 01/16/2015, 03/17/2018  . Pneumococcal Conjugate-13 02/10/2016, 03/01/2017  . Pneumococcal-Unspecified 05/17/2013  . Zoster Recombinat (Shingrix) 09/16/2016, 02/19/2017   Pertinent  Health Maintenance Due  Topic Date Due  . FOOT EXAM  Never done  . OPHTHALMOLOGY EXAM  Never done  . URINE MICROALBUMIN  Never done  . PNA vac Low Risk Adult (2 of 2 - PPSV23) 05/17/2018  . INFLUENZA VACCINE  12/16/2019  . HEMOGLOBIN A1C  05/22/2020  . COLONOSCOPY  07/09/2027    Vitals:   11/27/19 1045  BP: 132/84  Pulse: 95  Resp: 20  Temp: 97.9 F (36.6 C)  TempSrc: Oral  SpO2: 96%  Weight: 170 lb (77.1 kg)  Height: 5\' 10"  (1.778 m)   Body mass index is 24.39 kg/m.  Physical Exam  GENERAL APPEARANCE: Well nourished. In no acute distress. Normal body habitus SKIN:  Skin is warm and dry.  MOUTH and THROAT: Lips are without  lesions. Oral mucosa is moist and without lesions. Tongue is normal in shape, size, and color and without lesions RESPIRATORY: Breathing is even & unlabored, BS CTAB CARDIAC: RRR, no murmur,no extra heart sounds, no edema GI: Abdomen soft, normal BS, no masses, no tenderness EXTREMITIES: Able to move x4 extremities NEUROLOGICAL: There is no tremor. Speech is clear. Alert and oriented X 3. PSYCHIATRIC:  Affect and behavior are appropriate  Labs reviewed: Recent Labs    11/07/19 0659 11/07/19 0659 11/07/19 0711 11/08/19 0410 11/11/19 0234  NA 145   < > 146* 140 141  K 4.3   < > 4.1 3.8 4.2  CL 106  --   --  99 102  CO2 26  --   --  27 28  GLUCOSE 153*  --   --  146* 131*  BUN 17  --   --  19 17  CREATININE 1.00  --   --  0.88 0.99  CALCIUM 10.4*  --   --  9.1 10.2  MG  --   --   --   --  2.1  PHOS  --   --   --   --  3.4   < > = values in this interval not displayed.   Recent Labs    08/09/19 1546 11/11/19 0234  AST 28  --   ALT 20  --   ALKPHOS 45  --   BILITOT 1.4*  --   PROT 7.9  --   ALBUMIN 4.0 2.9*   Recent Labs    08/09/19 1546 11/07/19 0659 11/08/19 0410 11/10/19 0448 11/11/19 0234  WBC 7.3   < > 16.7* 14.6* 14.5*  NEUTROABS 5.3  --   --  10.3* 11.4*  HGB 15.9   < > 14.6 15.6 16.1  HCT 47.9   < > 43.6 45.9 47.5  MCV 83.3   < > 82.0 81.1 80.4  PLT 378   < > 270 337 384   < > = values in this interval not displayed.   Lab Results  Component Value Date   TSH 1.958  12/07/2008    Lab Results  Component Value Date   HGBA1C 5.8 11/20/2019   Lab Results  Component Value Date   CHOL  06/23/2008    174                HDL 46 06/23/2008   LDLCALC (H) 06/23/2008       TRIG 41 06/23/2008   CHOLHDL 3.8 06/23/2008  Significant Diagnostic Results in last 30 days:  DG Abd 1 View  Result Date: 11/10/2019 CLINICAL DATA:  Abdominal distension with epigastric pain. EXAM: ABDOMEN - 1 VIEW COMPARISON:  None. FINDINGS: Moderate gaseous distention of the  stomach. This may be the result of a degree of gastric outlet obstruction. Remainder of the bowel gas pattern is nonobstructive with mild fecal retention over the right colon. No free peritoneal air. Mild degenerative change of the spine. Bilateral hip arthroplasties. IMPRESSION: Moderate gastric distention of the stomach which could be due to a degree of gastric outlet obstruction. Electronically Signed   By: Marin Olp M.D.   On: 11/10/2019 12:31   DG Chest Port 1 View  Result Date: 11/07/2019 CLINICAL DATA:  COPD. EXAM: PORTABLE CHEST 1 VIEW COMPARISON:  None. FINDINGS: Heart size normal. Atherosclerotic changes are noted in the aorta. Mild airspace opacities at the left base have increased slightly. IMPRESSION: Slight increase in mild left basilar airspace disease. While this may represent atelectasis, infection is not excluded. Electronically Signed   By: San Morelle M.D.   On: 11/07/2019 07:43   DG Abd Portable 1V  Result Date: 11/10/2019 CLINICAL DATA:  Heartburn. EXAM: PORTABLE ABDOMEN - 1 VIEW COMPARISON:  11/10/2019 FINDINGS: Interval placement of a nasogastric tube without the previously demonstrated gaseous distention of the stomach. Normal bowel gas pattern. Prominent stool in the right colon. Left hip prosthesis. IMPRESSION: 1. Nasogastric tube in the stomach. 2. Prominent stool in the right colon. Electronically Signed   By: Claudie Revering M.D.   On: 11/10/2019 18:38    Assessment/Plan  1. Dysuria - no hematuria nor fever -  Will do urinalysis with culture and sensitivity to rule out UTI  2. DM (diabetes mellitus), type 2 with peripheral vascular complications (HCC) Lab Results  Component Value Date   HGBA1C 5.8 11/20/2019   - will discontinue Metformin and continue CBG check every morning before breakfast x1 week  3. Depression with anxiety -  Will decrease BuSpar from 5 mg TID to BID -  Continue Remeron  4. COPD GOLD III with reversibility  - no wheezing,  stable -   Continue PRN albuterol inhaler, Spiriva, Mucinex and budesonide-formoterol  5. Chronic respiratory failure with hypoxia (HCC) - no SOB, continue O2 at 4L/min via [place] continuously     Family/ staff Communication: Discussed plan of care with resident and charge nurse.  Labs/tests ordered:   UA with culture and sensitivity  Goals of care:   Short-term care   Durenda Age, DNP, MSN, FNP-BC [facility] [phone] (Monday-Friday 8:00 a.m. - 5:00 p.m.) [phone] (after hours)

## 2019-11-29 ENCOUNTER — Non-Acute Institutional Stay (SKILLED_NURSING_FACILITY): Payer: Medicare Other | Admitting: Adult Health

## 2019-11-29 ENCOUNTER — Encounter: Payer: Self-pay | Admitting: Adult Health

## 2019-11-29 DIAGNOSIS — N3281 Overactive bladder: Secondary | ICD-10-CM | POA: Diagnosis not present

## 2019-11-29 DIAGNOSIS — I25119 Atherosclerotic heart disease of native coronary artery with unspecified angina pectoris: Secondary | ICD-10-CM

## 2019-11-29 DIAGNOSIS — J449 Chronic obstructive pulmonary disease, unspecified: Secondary | ICD-10-CM | POA: Diagnosis not present

## 2019-11-29 DIAGNOSIS — K5901 Slow transit constipation: Secondary | ICD-10-CM

## 2019-11-29 DIAGNOSIS — K2900 Acute gastritis without bleeding: Secondary | ICD-10-CM | POA: Diagnosis not present

## 2019-11-29 DIAGNOSIS — F418 Other specified anxiety disorders: Secondary | ICD-10-CM

## 2019-11-29 DIAGNOSIS — I10 Essential (primary) hypertension: Secondary | ICD-10-CM

## 2019-11-29 DIAGNOSIS — J9611 Chronic respiratory failure with hypoxia: Secondary | ICD-10-CM | POA: Diagnosis not present

## 2019-11-29 DIAGNOSIS — E1151 Type 2 diabetes mellitus with diabetic peripheral angiopathy without gangrene: Secondary | ICD-10-CM | POA: Diagnosis not present

## 2019-11-29 MED ORDER — SPIRIVA RESPIMAT 2.5 MCG/ACT IN AERS: 2.0000 | INHALATION_SPRAY | Freq: Every day | RESPIRATORY_TRACT | 0 refills | Status: DC

## 2019-11-29 MED ORDER — BUSPIRONE HCL 5 MG PO TABS: 5.0000 mg | ORAL_TABLET | Freq: Two times a day (BID) | ORAL | 0 refills | Status: DC

## 2019-11-29 MED ORDER — CLOPIDOGREL BISULFATE 75 MG PO TABS: 75.0000 mg | ORAL_TABLET | Freq: Every day | ORAL | 0 refills | Status: AC

## 2019-11-29 MED ORDER — ALBUTEROL SULFATE HFA 108 (90 BASE) MCG/ACT IN AERS: INHALATION_SPRAY | RESPIRATORY_TRACT | 0 refills | Status: AC

## 2019-11-29 MED ORDER — AMLODIPINE BESYLATE 5 MG PO TABS: 5.0000 mg | ORAL_TABLET | Freq: Every day | ORAL | 0 refills | Status: AC

## 2019-11-29 MED ORDER — PRAVASTATIN SODIUM 40 MG PO TABS: 40.0000 mg | ORAL_TABLET | Freq: Every day | ORAL | 0 refills | Status: AC

## 2019-11-29 MED ORDER — SENNA-DOCUSATE SODIUM 8.6-50 MG PO TABS: 1.0000 | ORAL_TABLET | Freq: Every day | ORAL | 0 refills | Status: DC

## 2019-11-29 MED ORDER — MIRTAZAPINE 45 MG PO TABS: 45.0000 mg | ORAL_TABLET | Freq: Every day | ORAL | 0 refills | Status: DC

## 2019-11-29 MED ORDER — MIRABEGRON ER 25 MG PO TB24: 25.0000 mg | ORAL_TABLET | Freq: Every day | ORAL | 0 refills | Status: DC

## 2019-11-29 MED ORDER — ATENOLOL 25 MG PO TABS: 25.0000 mg | ORAL_TABLET | Freq: Every day | ORAL | 0 refills | Status: AC

## 2019-11-29 MED ORDER — BUDESONIDE-FORMOTEROL FUMARATE 160-4.5 MCG/ACT IN AERO: 2.0000 | INHALATION_SPRAY | Freq: Two times a day (BID) | RESPIRATORY_TRACT | 0 refills | Status: DC

## 2019-11-29 MED ORDER — NITROGLYCERIN 0.4 MG SL SUBL: 0.4000 mg | SUBLINGUAL_TABLET | SUBLINGUAL | 0 refills | Status: AC | PRN

## 2019-11-29 MED ORDER — PANTOPRAZOLE SODIUM 40 MG PO TBEC: 40.0000 mg | DELAYED_RELEASE_TABLET | Freq: Every day | ORAL | 0 refills | Status: DC

## 2019-11-29 NOTE — Progress Notes (Signed)
Location:  [place] Room Number: 409-W Place of Service:  SNF (31) Provider:  Durenda Age, DNP, FNP-BC  Patient Care Team: Lucianne Lei, MD as PCP - General (Family Medicine)  Extended Emergency Contact Information Primary Emergency Contact: Susi,[name] Address: [address]          [place], [place] [address] [place] Phone: [phone] Mobile Phone: [phone] Relation: Spouse  Code Status:  FULL CODE  Goals of care: Advanced Directive information Advanced Directives 11/13/2019  Does Patient Have a Medical Advance Directive? No  Type of Advance Directive -  Would patient like information on creating a medical advance directive? No - Patient declined  Pre-existing out of facility DNR order (yellow form or pink MOST form) -  Some encounter information is confidential and restricted. Go to Review Flowsheets activity to see all data.     Chief Complaint  Patient presents with   Discharge Note    Patient was seen for discharge home on 12/01/19    HPI:  Pt is a 69 y.o. male who is for discharge home on 12/01/19 with home health PT and OT.  He was admitted to [facility] on 11/15/19 post [facility] hospitalization 11/07/2019 to 11/15/19.  He has a PMH of COPD, diabetes mellitus, CAD, hyperlipidemia, hypertension and OSA. He uses O2 at 3L/min at home and was complaining of shortness of breath with O2 sat in the 80s, cough and wheezing when he went to the ED. He was started on BIPAP.  Chest x-ray showed increase in mild left basilar lung airspace disease and was a started on IV empiric antibiotics, Solumedrol and magnesium. He completed course of antibiotics for community acquired pneumonia. KUB showed possible outlet obstruction, NG tube was placed with intermittent suction.  GI was consulted and performed EGD on 11/12/2019.  EGD revealed mild esophagitis with gastritis.  No gastric outlet obstruction noted.  He  was a started on IV Protonix and NG tube was discontinued.  Multiple soapsuds enema and laxatives were given for his significant colonic fecal burden.  He developed significant tachycardia with heart rate of 120 bpm. Atenolol was apparently on hold. He was given  500 ml normal saline and Atenolol and was restarted. Heart rate went back to normal. Of note, colonoscopy done on 07/09/17 showed nonbleeding transverse colon AVMs and some diverticula. On 08/30/14, his colonoscopy was positive for 3 small adenomas. Both procedures did show a small condyloma.   He was seen in his room today. He stated that he has O2 at home. He complained of urinary frequency and sometimes cannot control it. He stated that he barely can accomplish anything without urinating repeatedly.  Urinalysis done on 10/29/19 was negative for UTI. No reported fever nor hematuria.  Patient was admitted to this facility for short-term rehabilitation after the patient's recent hospitalization.  Patient has completed SNF rehabilitation and therapy has cleared the patient for discharge.   Past Medical History:  Diagnosis Date   Anxiety    Arthritis    COPD (chronic obstructive pulmonary disease) ([place])    Coronary artery disease    x4   Depression    Diabetes mellitus without complication ([place])    Gastritis    6/23-11/15/2019 gastric outlet obstruction suggested on KUB.  EGD revealed gastritis and esophagitis   GERD (gastroesophageal reflux disease)    tums will relieve   HLD (hyperlipidemia)    HTN (hypertension)    Obstructive sleep apnea  sleep study 2006, uses CPAP, pt does not know cpap settings   On home oxygen therapy    2L via nasal cannula prn   Shortness of breath    exertion   Past Surgical History:  Procedure Laterality Date   CARDIAC CATHETERIZATION  2003,2010   x4    COLONOSCOPY WITH PROPOFOL N/A 08/30/2014   Procedure: COLONOSCOPY WITH PROPOFOL;  Surgeon: Carol Ada, MD;  Location: [facility] ENDOSCOPY;   Service: Endoscopy;  Laterality: N/A;  h&p in file    COLONOSCOPY WITH PROPOFOL N/A 07/08/2017   Procedure: COLONOSCOPY WITH PROPOFOL;  Surgeon: Carol Ada, MD;  Location: [facility] ENDOSCOPY;  Service: Endoscopy;  Laterality: N/A;   CORONARY ANGIOPLASTY     stents x 4   DENTAL SURGERY     ESOPHAGOGASTRODUODENOSCOPY (EGD) WITH PROPOFOL N/A 11/12/2019   Procedure: ESOPHAGOGASTRODUODENOSCOPY (EGD) WITH PROPOFOL;  Surgeon: Carol Ada, MD;  Location: [place];  Service: Endoscopy;  Laterality: N/A;   TOTAL HIP ARTHROPLASTY  04/06/2012   Procedure: TOTAL HIP ARTHROPLASTY;  Surgeon: Sharmon Revere, MD;  Location: [place];  Service: Orthopedics;  Laterality: Right;   TOTAL HIP ARTHROPLASTY Left 04/03/2013   Procedure: LEFT TOTAL HIP ARTHROPLASTY;  Surgeon: Sharmon Revere, MD;  Location: [facility];  Service: Orthopedics;  Laterality: Left;    No Known Allergies  Outpatient Encounter Medications as of 11/29/2019  Medication Sig   acetaminophen (TYLENOL) 325 MG tablet Take 650 mg by mouth every 6 (six) hours as needed.   albuterol (PROAIR HFA) 108 (90 Base) MCG/ACT inhaler 2 puffs every 4 hours as needed only  if your can't catch your breath   amLODipine (NORVASC) 5 MG tablet Take 5 mg by mouth at bedtime.    aspirin EC 81 MG tablet Take 81 mg by mouth at bedtime.   atenolol (TENORMIN) 25 MG tablet Take 25 mg by mouth at bedtime.    budesonide-formoterol (SYMBICORT) 160-4.5 MCG/ACT inhaler Inhale 2 puffs into the lungs 2 (two) times daily.   busPIRone (BUSPAR) 5 MG tablet Take 5 mg by mouth 2 (two) times daily.   clopidogrel (PLAVIX) 75 MG tablet Take 75 mg by mouth at bedtime.    Coenzyme Q10 (COQ10) 100 MG CAPS Take 1 capsule by mouth at bedtime.    feeding supplement, ENSURE ENLIVE, (ENSURE ENLIVE) LIQD Take 237 mLs by mouth daily.   guaiFENesin (MUCINEX) 600 MG 12 hr tablet Take 600 mg by mouth 2 (two) times daily.   mirtazapine (REMERON) 45 MG tablet Take 1 tablet (45 mg total)  by mouth at bedtime.   Multiple Vitamin (MULTIVITAMIN WITH MINERALS) TABS Take 1 tablet by mouth at bedtime.    nitroGLYCERIN (NITROSTAT) 0.4 MG SL tablet Place 1 tablet (0.4 mg total) under the tongue every 5 (five) minutes as needed for chest pain.   Nutritional Supplements (ENSURE CLEAR) LIQD Take 237 mLs by mouth daily.   OXYGEN Inhale 4 L into the lungs continuous. With exertion only    pantoprazole (PROTONIX) 40 MG tablet Take 1 tablet (40 mg total) by mouth daily.   pravastatin (PRAVACHOL) 40 MG tablet Take 40 mg by mouth daily. Taking at bedtime   sennosides-docusate sodium (SENOKOT-S) 8.6-50 MG tablet Take 1 tablet by mouth daily.   sodium chloride (OCEAN) 0.65 % SOLN nasal spray Place 2 sprays into both nostrils as needed for congestion.   Tiotropium Bromide Monohydrate (SPIRIVA RESPIMAT) 2.5 MCG/ACT AERS Inhale 2 puffs into the lungs daily.   vitamin B-12 (CYANOCOBALAMIN) 1000 MCG tablet Take  1,000 mcg by mouth daily.    [DISCONTINUED] busPIRone (BUSPAR) 5 MG tablet Take 1 tablet (5 mg total) by mouth 3 (three) times daily.   [DISCONTINUED] metFORMIN (GLUMETZA) 500 MG (MOD) 24 hr tablet Take 500 mg by mouth every evening. After meal   [DISCONTINUED] Nutritional Supplements (ENSURE CLEAR) LIQD Take 1 each by mouth daily.   No facility-administered encounter medications on file as of 11/29/2019.    Review of Systems  GENERAL: No change in appetite, no fatigue, no weight changes, no fever, chills or weakness MOUTH and THROAT: Denies oral discomfort, gingival pain or bleeding, pain from teeth or hoarseness   RESPIRATORY: no cough, SOB, DOE, wheezing, hemoptysis CARDIAC: No chest pain, edema or palpitations GI: No abdominal pain, diarrhea, constipation, heart burn, nausea or vomiting GU: Denies dysuria, +frequency NEUROLOGICAL: Denies dizziness, syncope, numbness, or headache PSYCHIATRIC: Denies feelings of depression or anxiety. No report of hallucinations, insomnia,  paranoia, or agitation   Immunization History  Administered Date(s) Administered   Influenza Split 02/14/2013, 02/15/2015   Influenza, High Dose Seasonal PF 02/10/2016, 03/16/2018   Influenza,inj,Quad PF,6+ Mos 01/28/2014   Influenza-Unspecified 01/16/2015, 03/17/2018   Pneumococcal Conjugate-13 02/10/2016, 03/01/2017   Pneumococcal-Unspecified 05/17/2013   Zoster Recombinat (Shingrix) 09/16/2016, 02/19/2017   Pertinent  Health Maintenance Due  Topic Date Due   FOOT EXAM  Never done   OPHTHALMOLOGY EXAM  Never done   URINE MICROALBUMIN  Never done   PNA vac Low Risk Adult (2 of 2 - PPSV23) 05/17/2018   INFLUENZA VACCINE  12/16/2019   HEMOGLOBIN A1C  05/22/2020   COLONOSCOPY  07/09/2027   No flowsheet data found.   Vitals:   11/29/19 1209  BP: 109/72  Pulse: 78  Resp: 20  Temp: (!) 97.3 F (36.3 C)  TempSrc: Oral  Weight: 164 lb 12.8 oz (74.8 kg)  Height: 5\' 10"  (1.778 m)   Body mass index is 23.65 kg/m.  Physical Exam  GENERAL APPEARANCE: Well nourished. In no acute distress. Normal body habitus SKIN:  Skin is warm and dry.  MOUTH and THROAT: Lips are without lesions. Oral mucosa is moist and without lesions. Tongue is normal in shape, size, and color and without lesions RESPIRATORY: Breathing is even & unlabored, BS CTAB CARDIAC: RRR, no murmur,no extra heart sounds, no edema GI: Abdomen soft, normal BS, no masses, no tenderness EXTREMITIES:  Able to move X 4 extremities NEUROLOGICAL: There is no tremor. Speech is clear. Alert and oriented X 3. PSYCHIATRIC:  Affect and behavior are appropriate  Labs reviewed: Recent Labs    11/07/19 0659 11/07/19 0659 11/07/19 0711 11/08/19 0410 11/11/19 0234  NA 145   < > 146* 140 141  K 4.3   < > 4.1 3.8 4.2  CL 106  --   --  99 102  CO2 26  --   --  27 28  GLUCOSE 153*  --   --  146* 131*  BUN 17  --   --  19 17  CREATININE 1.00  --   --  0.88 0.99  CALCIUM 10.4*  --   --  9.1 10.2  MG  --   --    --   --  2.1  PHOS  --   --   --   --  3.4   < > = values in this interval not displayed.   Recent Labs    08/09/19 1546 11/11/19 0234  AST 28  --   ALT 20  --  ALKPHOS 45  --   BILITOT 1.4*  --   PROT 7.9  --   ALBUMIN 4.0 2.9*   Recent Labs    08/09/19 1546 11/07/19 0659 11/08/19 0410 11/10/19 0448 11/11/19 0234  WBC 7.3   < > 16.7* 14.6* 14.5*  NEUTROABS 5.3  --   --  10.3* 11.4*  HGB 15.9   < > 14.6 15.6 16.1  HCT 47.9   < > 43.6 45.9 47.5  MCV 83.3   < > 82.0 81.1 80.4  PLT 378   < > 270 337 384   < > = values in this interval not displayed.   Lab Results  Component Value Date   TSH 1.958 Test methodology is 3rd generation TSH 12/07/2008   Lab Results  Component Value Date   HGBA1C 5.8 11/20/2019   Lab Results  Component Value Date   CHOL  06/23/2008    174        ATP III CLASSIFICATION:  <200     mg/dL   Desirable  200-239  mg/dL   Borderline High  >=240    mg/dL   High          HDL 46 06/23/2008   LDLCALC (H) 06/23/2008    120        Total Cholesterol/HDL:CHD Risk Coronary Heart Disease Risk Table                     Men   Women  1/2 Average Risk   3.4   3.3  Average Risk       5.0   4.4  2 X Average Risk   9.6   7.1  3 X Average Risk  23.4   11.0        Use the calculated Patient Ratio above and the CHD Risk Table to determine the patient's CHD Risk.        ATP III CLASSIFICATION (LDL):  <100     mg/dL   Optimal  100-129  mg/dL   Near or Above                    Optimal  130-159  mg/dL   Borderline  160-189  mg/dL   High  >190     mg/dL   Very High   TRIG 41 06/23/2008   CHOLHDL 3.8 06/23/2008    Significant Diagnostic Results in last 30 days:  DG Abd 1 View  Result Date: 11/10/2019 CLINICAL DATA:  Abdominal distension with epigastric pain. EXAM: ABDOMEN - 1 VIEW COMPARISON:  None. FINDINGS: Moderate gaseous distention of the stomach. This may be the result of a degree of gastric outlet obstruction. Remainder of the bowel gas  pattern is nonobstructive with mild fecal retention over the right colon. No free peritoneal air. Mild degenerative change of the spine. Bilateral hip arthroplasties. IMPRESSION: Moderate gastric distention of the stomach which could be due to a degree of gastric outlet obstruction. Electronically Signed   By: Marin Olp M.D.   On: 11/10/2019 12:31   DG Chest Port 1 View  Result Date: 11/07/2019 CLINICAL DATA:  COPD. EXAM: PORTABLE CHEST 1 VIEW COMPARISON:  None. FINDINGS: Heart size normal. Atherosclerotic changes are noted in the aorta. Mild airspace opacities at the left base have increased slightly. IMPRESSION: Slight increase in mild left basilar airspace disease. While this may represent atelectasis, infection is not excluded. Electronically Signed   By: San Morelle M.D.   On: 11/07/2019  07:43   DG Abd Portable 1V  Result Date: 11/10/2019 CLINICAL DATA:  Heartburn. EXAM: PORTABLE ABDOMEN - 1 VIEW COMPARISON:  11/10/2019 FINDINGS: Interval placement of a nasogastric tube without the previously demonstrated gaseous distention of the stomach. Normal bowel gas pattern. Prominent stool in the right colon. Left hip prosthesis. IMPRESSION: 1. Nasogastric tube in the stomach. 2. Prominent stool in the right colon. Electronically Signed   By: Claudie Revering M.D.   On: 11/10/2019 18:38    Assessment/Plan  1. Chronic respiratory failure with hypoxia (HCC) -  Continue O2 at 4 L/minute via [place] and PRN albuterol  2. COPD GOLD III with reversibility  - albuterol (PROAIR HFA) 108 (90 Base) MCG/ACT inhaler; 2 puffs every 4 hours as needed only  if your can't catch your breath  Dispense: 18 g; Refill: 0 - budesonide-formoterol (SYMBICORT) 160-4.5 MCG/ACT inhaler; Inhale 2 puffs into the lungs 2 (two) times daily.  Dispense: 3 Inhaler; Refill: 0  3. Acute gastritis without hemorrhage, unspecified gastritis type -  Denies abdominal pain - pantoprazole (PROTONIX) 40 MG tablet; Take 1 tablet (40 mg  total) by mouth daily.  Dispense: 30 tablet; Refill: 0  4. DM (diabetes mellitus), type 2 with peripheral vascular complications [facility]) Lab Results  Component Value Date   HGBA1C 5.8 11/20/2019   -  Metformin was discontinued  5. OAB (overactive bladder) -  will start on Myrbetriq - mirabegron ER (MYRBETRIQ) 25 MG TB24 tablet; Take 1 tablet (25 mg total) by mouth daily.  Dispense: 30 tablet; Refill: 0  6. Essential hypertension - amLODipine (NORVASC) 5 MG tablet; Take 1 tablet (5 mg total) by mouth at bedtime.  Dispense: 30 tablet; Refill: 0 - atenolol (TENORMIN) 25 MG tablet; Take 1 tablet (25 mg total) by mouth at bedtime.  Dispense: 30 tablet; Refill: 0  7. Coronary artery disease involving native coronary artery of native heart with angina pectoris (HCC) -No complaints of chest pains - clopidogrel (PLAVIX) 75 MG tablet; Take 1 tablet (75 mg total) by mouth at bedtime.  Dispense: 30 tablet; Refill: 0 - nitroGLYCERIN (NITROSTAT) 0.4 MG SL tablet; Place 1 tablet (0.4 mg total) under the tongue every 5 (five) minutes as needed for chest pain.  Dispense: 25 tablet; Refill: 0 - pravastatin (PRAVACHOL) 40 MG tablet; Take 1 tablet (40 mg total) by mouth daily. Taking at bedtime  Dispense: 30 tablet; Refill: 0  8. Depression with anxiety - busPIRone (BUSPAR) 5 MG tablet; Take 1 tablet (5 mg total) by mouth 2 (two) times daily.  Dispense: 60 tablet; Refill: 0 - mirtazapine (REMERON) 45 MG tablet; Take 1 tablet (45 mg total) by mouth at bedtime.  Dispense: 30 tablet; Refill: 0  9. Slow transit constipation - sennosides-docusate sodium (SENOKOT-S) 8.6-50 MG tablet; Take 1 tablet by mouth daily.  Dispense: 30 tablet; Refill: 0    I have filled out patient's discharge paperwork and e-prescribed medications.  Patient will have home health PT and OT.  DME provided: Wheelchair, walker and 3 in 1  Wheelchair -patient suffers from diabetes mellitus type with peripheral vascular complications  which impairs his ability to perform daily activities like toileting, dressing, grooming and bathing in the home.  A cane or walker will not resolve issue with performing activities of daily living.  A wheelchair will allow patient to safely perform daily activities.  Patient can safely propel the wheelchair in the home and has a caregiver who can provide assistance.   Total discharge time: Greater than 30 minutes  Greater than 50% was spent in counseling and coordination of care.    Discharge time involved coordination of the discharge process with social worker, nursing staff and therapy department. Medical justification for home health services/DME verified.    Durenda Age, DNP, FNP-BC [facility] [phone] (Monday-Friday 8:00 a.m. - 5:00 p.m.) [phone] (after hours)

## 2019-12-03 DIAGNOSIS — I2510 Atherosclerotic heart disease of native coronary artery without angina pectoris: Secondary | ICD-10-CM | POA: Diagnosis not present

## 2019-12-03 DIAGNOSIS — K2960 Other gastritis without bleeding: Secondary | ICD-10-CM | POA: Diagnosis not present

## 2019-12-03 DIAGNOSIS — S2231XD Fracture of one rib, right side, subsequent encounter for fracture with routine healing: Secondary | ICD-10-CM | POA: Diagnosis not present

## 2019-12-03 DIAGNOSIS — E1151 Type 2 diabetes mellitus with diabetic peripheral angiopathy without gangrene: Secondary | ICD-10-CM | POA: Diagnosis not present

## 2019-12-03 DIAGNOSIS — J9621 Acute and chronic respiratory failure with hypoxia: Secondary | ICD-10-CM | POA: Diagnosis not present

## 2019-12-03 DIAGNOSIS — K2100 Gastro-esophageal reflux disease with esophagitis, without bleeding: Secondary | ICD-10-CM | POA: Diagnosis not present

## 2019-12-03 DIAGNOSIS — I5032 Chronic diastolic (congestive) heart failure: Secondary | ICD-10-CM | POA: Diagnosis not present

## 2019-12-03 DIAGNOSIS — I10 Essential (primary) hypertension: Secondary | ICD-10-CM | POA: Diagnosis not present

## 2019-12-03 DIAGNOSIS — Z9981 Dependence on supplemental oxygen: Secondary | ICD-10-CM | POA: Diagnosis not present

## 2019-12-03 DIAGNOSIS — F17290 Nicotine dependence, other tobacco product, uncomplicated: Secondary | ICD-10-CM | POA: Diagnosis not present

## 2019-12-03 DIAGNOSIS — J441 Chronic obstructive pulmonary disease with (acute) exacerbation: Secondary | ICD-10-CM | POA: Diagnosis not present

## 2019-12-03 DIAGNOSIS — Z7982 Long term (current) use of aspirin: Secondary | ICD-10-CM | POA: Diagnosis not present

## 2019-12-03 DIAGNOSIS — E785 Hyperlipidemia, unspecified: Secondary | ICD-10-CM | POA: Diagnosis not present

## 2019-12-03 DIAGNOSIS — I739 Peripheral vascular disease, unspecified: Secondary | ICD-10-CM | POA: Diagnosis not present

## 2019-12-04 ENCOUNTER — Other Ambulatory Visit (HOSPITAL_COMMUNITY): Payer: Self-pay | Admitting: Psychiatry

## 2019-12-04 DIAGNOSIS — F418 Other specified anxiety disorders: Secondary | ICD-10-CM

## 2019-12-07 DIAGNOSIS — J441 Chronic obstructive pulmonary disease with (acute) exacerbation: Secondary | ICD-10-CM | POA: Diagnosis not present

## 2019-12-07 DIAGNOSIS — K2100 Gastro-esophageal reflux disease with esophagitis, without bleeding: Secondary | ICD-10-CM | POA: Diagnosis not present

## 2019-12-07 DIAGNOSIS — Z7982 Long term (current) use of aspirin: Secondary | ICD-10-CM | POA: Diagnosis not present

## 2019-12-07 DIAGNOSIS — I10 Essential (primary) hypertension: Secondary | ICD-10-CM | POA: Diagnosis not present

## 2019-12-07 DIAGNOSIS — E1151 Type 2 diabetes mellitus with diabetic peripheral angiopathy without gangrene: Secondary | ICD-10-CM | POA: Diagnosis not present

## 2019-12-07 DIAGNOSIS — E785 Hyperlipidemia, unspecified: Secondary | ICD-10-CM | POA: Diagnosis not present

## 2019-12-07 DIAGNOSIS — F17290 Nicotine dependence, other tobacco product, uncomplicated: Secondary | ICD-10-CM | POA: Diagnosis not present

## 2019-12-07 DIAGNOSIS — Z9981 Dependence on supplemental oxygen: Secondary | ICD-10-CM | POA: Diagnosis not present

## 2019-12-07 DIAGNOSIS — I739 Peripheral vascular disease, unspecified: Secondary | ICD-10-CM | POA: Diagnosis not present

## 2019-12-07 DIAGNOSIS — K2960 Other gastritis without bleeding: Secondary | ICD-10-CM | POA: Diagnosis not present

## 2019-12-07 DIAGNOSIS — J9621 Acute and chronic respiratory failure with hypoxia: Secondary | ICD-10-CM | POA: Diagnosis not present

## 2019-12-07 DIAGNOSIS — I2510 Atherosclerotic heart disease of native coronary artery without angina pectoris: Secondary | ICD-10-CM | POA: Diagnosis not present

## 2019-12-11 DIAGNOSIS — J441 Chronic obstructive pulmonary disease with (acute) exacerbation: Secondary | ICD-10-CM | POA: Diagnosis not present

## 2019-12-11 DIAGNOSIS — E1151 Type 2 diabetes mellitus with diabetic peripheral angiopathy without gangrene: Secondary | ICD-10-CM | POA: Diagnosis not present

## 2019-12-11 DIAGNOSIS — Z9981 Dependence on supplemental oxygen: Secondary | ICD-10-CM | POA: Diagnosis not present

## 2019-12-11 DIAGNOSIS — I10 Essential (primary) hypertension: Secondary | ICD-10-CM | POA: Diagnosis not present

## 2019-12-11 DIAGNOSIS — I739 Peripheral vascular disease, unspecified: Secondary | ICD-10-CM | POA: Diagnosis not present

## 2019-12-11 DIAGNOSIS — K2960 Other gastritis without bleeding: Secondary | ICD-10-CM | POA: Diagnosis not present

## 2019-12-11 DIAGNOSIS — F17290 Nicotine dependence, other tobacco product, uncomplicated: Secondary | ICD-10-CM | POA: Diagnosis not present

## 2019-12-11 DIAGNOSIS — Z7982 Long term (current) use of aspirin: Secondary | ICD-10-CM | POA: Diagnosis not present

## 2019-12-11 DIAGNOSIS — J9621 Acute and chronic respiratory failure with hypoxia: Secondary | ICD-10-CM | POA: Diagnosis not present

## 2019-12-11 DIAGNOSIS — E785 Hyperlipidemia, unspecified: Secondary | ICD-10-CM | POA: Diagnosis not present

## 2019-12-11 DIAGNOSIS — K2100 Gastro-esophageal reflux disease with esophagitis, without bleeding: Secondary | ICD-10-CM | POA: Diagnosis not present

## 2019-12-11 DIAGNOSIS — I2510 Atherosclerotic heart disease of native coronary artery without angina pectoris: Secondary | ICD-10-CM | POA: Diagnosis not present

## 2019-12-13 DIAGNOSIS — J441 Chronic obstructive pulmonary disease with (acute) exacerbation: Secondary | ICD-10-CM | POA: Diagnosis not present

## 2019-12-13 DIAGNOSIS — Z7982 Long term (current) use of aspirin: Secondary | ICD-10-CM | POA: Diagnosis not present

## 2019-12-13 DIAGNOSIS — K2100 Gastro-esophageal reflux disease with esophagitis, without bleeding: Secondary | ICD-10-CM | POA: Diagnosis not present

## 2019-12-13 DIAGNOSIS — I2510 Atherosclerotic heart disease of native coronary artery without angina pectoris: Secondary | ICD-10-CM | POA: Diagnosis not present

## 2019-12-13 DIAGNOSIS — I10 Essential (primary) hypertension: Secondary | ICD-10-CM | POA: Diagnosis not present

## 2019-12-13 DIAGNOSIS — E1151 Type 2 diabetes mellitus with diabetic peripheral angiopathy without gangrene: Secondary | ICD-10-CM | POA: Diagnosis not present

## 2019-12-13 DIAGNOSIS — E785 Hyperlipidemia, unspecified: Secondary | ICD-10-CM | POA: Diagnosis not present

## 2019-12-13 DIAGNOSIS — J9621 Acute and chronic respiratory failure with hypoxia: Secondary | ICD-10-CM | POA: Diagnosis not present

## 2019-12-13 DIAGNOSIS — K2960 Other gastritis without bleeding: Secondary | ICD-10-CM | POA: Diagnosis not present

## 2019-12-13 DIAGNOSIS — Z9981 Dependence on supplemental oxygen: Secondary | ICD-10-CM | POA: Diagnosis not present

## 2019-12-13 DIAGNOSIS — F17290 Nicotine dependence, other tobacco product, uncomplicated: Secondary | ICD-10-CM | POA: Diagnosis not present

## 2019-12-13 DIAGNOSIS — I739 Peripheral vascular disease, unspecified: Secondary | ICD-10-CM | POA: Diagnosis not present

## 2019-12-14 DIAGNOSIS — I10 Essential (primary) hypertension: Secondary | ICD-10-CM | POA: Diagnosis not present

## 2019-12-14 DIAGNOSIS — E119 Type 2 diabetes mellitus without complications: Secondary | ICD-10-CM | POA: Diagnosis not present

## 2019-12-14 DIAGNOSIS — Z720 Tobacco use: Secondary | ICD-10-CM | POA: Diagnosis not present

## 2019-12-14 DIAGNOSIS — J449 Chronic obstructive pulmonary disease, unspecified: Secondary | ICD-10-CM | POA: Diagnosis not present

## 2019-12-17 DIAGNOSIS — K2100 Gastro-esophageal reflux disease with esophagitis, without bleeding: Secondary | ICD-10-CM | POA: Diagnosis not present

## 2019-12-17 DIAGNOSIS — I10 Essential (primary) hypertension: Secondary | ICD-10-CM | POA: Diagnosis not present

## 2019-12-17 DIAGNOSIS — E1151 Type 2 diabetes mellitus with diabetic peripheral angiopathy without gangrene: Secondary | ICD-10-CM | POA: Diagnosis not present

## 2019-12-17 DIAGNOSIS — Z7982 Long term (current) use of aspirin: Secondary | ICD-10-CM | POA: Diagnosis not present

## 2019-12-17 DIAGNOSIS — F17290 Nicotine dependence, other tobacco product, uncomplicated: Secondary | ICD-10-CM | POA: Diagnosis not present

## 2019-12-17 DIAGNOSIS — J9621 Acute and chronic respiratory failure with hypoxia: Secondary | ICD-10-CM | POA: Diagnosis not present

## 2019-12-17 DIAGNOSIS — E785 Hyperlipidemia, unspecified: Secondary | ICD-10-CM | POA: Diagnosis not present

## 2019-12-17 DIAGNOSIS — I739 Peripheral vascular disease, unspecified: Secondary | ICD-10-CM | POA: Diagnosis not present

## 2019-12-17 DIAGNOSIS — I2510 Atherosclerotic heart disease of native coronary artery without angina pectoris: Secondary | ICD-10-CM | POA: Diagnosis not present

## 2019-12-17 DIAGNOSIS — Z9981 Dependence on supplemental oxygen: Secondary | ICD-10-CM | POA: Diagnosis not present

## 2019-12-17 DIAGNOSIS — K2960 Other gastritis without bleeding: Secondary | ICD-10-CM | POA: Diagnosis not present

## 2019-12-17 DIAGNOSIS — J441 Chronic obstructive pulmonary disease with (acute) exacerbation: Secondary | ICD-10-CM | POA: Diagnosis not present

## 2019-12-18 DIAGNOSIS — K2100 Gastro-esophageal reflux disease with esophagitis, without bleeding: Secondary | ICD-10-CM | POA: Diagnosis not present

## 2019-12-18 DIAGNOSIS — J9621 Acute and chronic respiratory failure with hypoxia: Secondary | ICD-10-CM | POA: Diagnosis not present

## 2019-12-18 DIAGNOSIS — E1151 Type 2 diabetes mellitus with diabetic peripheral angiopathy without gangrene: Secondary | ICD-10-CM | POA: Diagnosis not present

## 2019-12-18 DIAGNOSIS — J441 Chronic obstructive pulmonary disease with (acute) exacerbation: Secondary | ICD-10-CM | POA: Diagnosis not present

## 2019-12-18 DIAGNOSIS — I10 Essential (primary) hypertension: Secondary | ICD-10-CM | POA: Diagnosis not present

## 2019-12-18 DIAGNOSIS — I2510 Atherosclerotic heart disease of native coronary artery without angina pectoris: Secondary | ICD-10-CM | POA: Diagnosis not present

## 2019-12-18 DIAGNOSIS — F17290 Nicotine dependence, other tobacco product, uncomplicated: Secondary | ICD-10-CM | POA: Diagnosis not present

## 2019-12-18 DIAGNOSIS — Z9981 Dependence on supplemental oxygen: Secondary | ICD-10-CM | POA: Diagnosis not present

## 2019-12-18 DIAGNOSIS — E785 Hyperlipidemia, unspecified: Secondary | ICD-10-CM | POA: Diagnosis not present

## 2019-12-18 DIAGNOSIS — I739 Peripheral vascular disease, unspecified: Secondary | ICD-10-CM | POA: Diagnosis not present

## 2019-12-18 DIAGNOSIS — K2960 Other gastritis without bleeding: Secondary | ICD-10-CM | POA: Diagnosis not present

## 2019-12-18 DIAGNOSIS — Z7982 Long term (current) use of aspirin: Secondary | ICD-10-CM | POA: Diagnosis not present

## 2019-12-20 DIAGNOSIS — J9621 Acute and chronic respiratory failure with hypoxia: Secondary | ICD-10-CM | POA: Diagnosis not present

## 2019-12-20 DIAGNOSIS — I2510 Atherosclerotic heart disease of native coronary artery without angina pectoris: Secondary | ICD-10-CM | POA: Diagnosis not present

## 2019-12-20 DIAGNOSIS — I10 Essential (primary) hypertension: Secondary | ICD-10-CM | POA: Diagnosis not present

## 2019-12-20 DIAGNOSIS — Z9981 Dependence on supplemental oxygen: Secondary | ICD-10-CM | POA: Diagnosis not present

## 2019-12-20 DIAGNOSIS — E1151 Type 2 diabetes mellitus with diabetic peripheral angiopathy without gangrene: Secondary | ICD-10-CM | POA: Diagnosis not present

## 2019-12-20 DIAGNOSIS — Z7982 Long term (current) use of aspirin: Secondary | ICD-10-CM | POA: Diagnosis not present

## 2019-12-20 DIAGNOSIS — F17290 Nicotine dependence, other tobacco product, uncomplicated: Secondary | ICD-10-CM | POA: Diagnosis not present

## 2019-12-20 DIAGNOSIS — K2960 Other gastritis without bleeding: Secondary | ICD-10-CM | POA: Diagnosis not present

## 2019-12-20 DIAGNOSIS — E785 Hyperlipidemia, unspecified: Secondary | ICD-10-CM | POA: Diagnosis not present

## 2019-12-20 DIAGNOSIS — J441 Chronic obstructive pulmonary disease with (acute) exacerbation: Secondary | ICD-10-CM | POA: Diagnosis not present

## 2019-12-20 DIAGNOSIS — I739 Peripheral vascular disease, unspecified: Secondary | ICD-10-CM | POA: Diagnosis not present

## 2019-12-20 DIAGNOSIS — K2100 Gastro-esophageal reflux disease with esophagitis, without bleeding: Secondary | ICD-10-CM | POA: Diagnosis not present

## 2019-12-24 DIAGNOSIS — E1151 Type 2 diabetes mellitus with diabetic peripheral angiopathy without gangrene: Secondary | ICD-10-CM | POA: Diagnosis not present

## 2019-12-24 DIAGNOSIS — J441 Chronic obstructive pulmonary disease with (acute) exacerbation: Secondary | ICD-10-CM | POA: Diagnosis not present

## 2019-12-24 DIAGNOSIS — I2510 Atherosclerotic heart disease of native coronary artery without angina pectoris: Secondary | ICD-10-CM | POA: Diagnosis not present

## 2019-12-24 DIAGNOSIS — E785 Hyperlipidemia, unspecified: Secondary | ICD-10-CM | POA: Diagnosis not present

## 2019-12-24 DIAGNOSIS — K2960 Other gastritis without bleeding: Secondary | ICD-10-CM | POA: Diagnosis not present

## 2019-12-24 DIAGNOSIS — F17290 Nicotine dependence, other tobacco product, uncomplicated: Secondary | ICD-10-CM | POA: Diagnosis not present

## 2019-12-24 DIAGNOSIS — J9621 Acute and chronic respiratory failure with hypoxia: Secondary | ICD-10-CM | POA: Diagnosis not present

## 2019-12-24 DIAGNOSIS — Z7982 Long term (current) use of aspirin: Secondary | ICD-10-CM | POA: Diagnosis not present

## 2019-12-24 DIAGNOSIS — K2100 Gastro-esophageal reflux disease with esophagitis, without bleeding: Secondary | ICD-10-CM | POA: Diagnosis not present

## 2019-12-24 DIAGNOSIS — I10 Essential (primary) hypertension: Secondary | ICD-10-CM | POA: Diagnosis not present

## 2019-12-24 DIAGNOSIS — I739 Peripheral vascular disease, unspecified: Secondary | ICD-10-CM | POA: Diagnosis not present

## 2019-12-24 DIAGNOSIS — Z9981 Dependence on supplemental oxygen: Secondary | ICD-10-CM | POA: Diagnosis not present

## 2019-12-25 DIAGNOSIS — I2510 Atherosclerotic heart disease of native coronary artery without angina pectoris: Secondary | ICD-10-CM | POA: Diagnosis not present

## 2019-12-25 DIAGNOSIS — K2960 Other gastritis without bleeding: Secondary | ICD-10-CM | POA: Diagnosis not present

## 2019-12-25 DIAGNOSIS — J9621 Acute and chronic respiratory failure with hypoxia: Secondary | ICD-10-CM | POA: Diagnosis not present

## 2019-12-25 DIAGNOSIS — Z9981 Dependence on supplemental oxygen: Secondary | ICD-10-CM | POA: Diagnosis not present

## 2019-12-25 DIAGNOSIS — F17290 Nicotine dependence, other tobacco product, uncomplicated: Secondary | ICD-10-CM | POA: Diagnosis not present

## 2019-12-25 DIAGNOSIS — I10 Essential (primary) hypertension: Secondary | ICD-10-CM | POA: Diagnosis not present

## 2019-12-25 DIAGNOSIS — I739 Peripheral vascular disease, unspecified: Secondary | ICD-10-CM | POA: Diagnosis not present

## 2019-12-25 DIAGNOSIS — Z7982 Long term (current) use of aspirin: Secondary | ICD-10-CM | POA: Diagnosis not present

## 2019-12-25 DIAGNOSIS — J441 Chronic obstructive pulmonary disease with (acute) exacerbation: Secondary | ICD-10-CM | POA: Diagnosis not present

## 2019-12-25 DIAGNOSIS — E1151 Type 2 diabetes mellitus with diabetic peripheral angiopathy without gangrene: Secondary | ICD-10-CM | POA: Diagnosis not present

## 2019-12-25 DIAGNOSIS — E785 Hyperlipidemia, unspecified: Secondary | ICD-10-CM | POA: Diagnosis not present

## 2019-12-25 DIAGNOSIS — K2100 Gastro-esophageal reflux disease with esophagitis, without bleeding: Secondary | ICD-10-CM | POA: Diagnosis not present

## 2019-12-26 ENCOUNTER — Telehealth (HOSPITAL_COMMUNITY): Payer: Medicare Other | Admitting: Psychiatry

## 2019-12-27 ENCOUNTER — Encounter (HOSPITAL_COMMUNITY): Payer: Self-pay | Admitting: Psychiatry

## 2019-12-27 ENCOUNTER — Other Ambulatory Visit: Payer: Self-pay

## 2019-12-27 ENCOUNTER — Telehealth (INDEPENDENT_AMBULATORY_CARE_PROVIDER_SITE_OTHER): Payer: Medicare Other | Admitting: Psychiatry

## 2019-12-27 VITALS — Wt 164.0 lb

## 2019-12-27 DIAGNOSIS — J449 Chronic obstructive pulmonary disease, unspecified: Secondary | ICD-10-CM | POA: Diagnosis not present

## 2019-12-27 DIAGNOSIS — G4733 Obstructive sleep apnea (adult) (pediatric): Secondary | ICD-10-CM | POA: Diagnosis not present

## 2019-12-27 DIAGNOSIS — F330 Major depressive disorder, recurrent, mild: Secondary | ICD-10-CM | POA: Diagnosis not present

## 2019-12-27 DIAGNOSIS — F17290 Nicotine dependence, other tobacco product, uncomplicated: Secondary | ICD-10-CM | POA: Diagnosis not present

## 2019-12-27 DIAGNOSIS — I10 Essential (primary) hypertension: Secondary | ICD-10-CM | POA: Diagnosis not present

## 2019-12-27 DIAGNOSIS — Z9981 Dependence on supplemental oxygen: Secondary | ICD-10-CM | POA: Diagnosis not present

## 2019-12-27 DIAGNOSIS — I739 Peripheral vascular disease, unspecified: Secondary | ICD-10-CM | POA: Diagnosis not present

## 2019-12-27 DIAGNOSIS — I2510 Atherosclerotic heart disease of native coronary artery without angina pectoris: Secondary | ICD-10-CM | POA: Diagnosis not present

## 2019-12-27 DIAGNOSIS — K2960 Other gastritis without bleeding: Secondary | ICD-10-CM | POA: Diagnosis not present

## 2019-12-27 DIAGNOSIS — E1151 Type 2 diabetes mellitus with diabetic peripheral angiopathy without gangrene: Secondary | ICD-10-CM | POA: Diagnosis not present

## 2019-12-27 DIAGNOSIS — F419 Anxiety disorder, unspecified: Secondary | ICD-10-CM | POA: Diagnosis not present

## 2019-12-27 DIAGNOSIS — Z7982 Long term (current) use of aspirin: Secondary | ICD-10-CM | POA: Diagnosis not present

## 2019-12-27 DIAGNOSIS — E785 Hyperlipidemia, unspecified: Secondary | ICD-10-CM | POA: Diagnosis not present

## 2019-12-27 DIAGNOSIS — J441 Chronic obstructive pulmonary disease with (acute) exacerbation: Secondary | ICD-10-CM | POA: Diagnosis not present

## 2019-12-27 DIAGNOSIS — E1165 Type 2 diabetes mellitus with hyperglycemia: Secondary | ICD-10-CM | POA: Diagnosis not present

## 2019-12-27 DIAGNOSIS — K2100 Gastro-esophageal reflux disease with esophagitis, without bleeding: Secondary | ICD-10-CM | POA: Diagnosis not present

## 2019-12-27 DIAGNOSIS — J9621 Acute and chronic respiratory failure with hypoxia: Secondary | ICD-10-CM | POA: Diagnosis not present

## 2019-12-27 MED ORDER — BUSPIRONE HCL 5 MG PO TABS: 5.0000 mg | ORAL_TABLET | Freq: Two times a day (BID) | ORAL | 0 refills | Status: DC

## 2019-12-27 MED ORDER — MIRTAZAPINE 45 MG PO TABS: 45.0000 mg | ORAL_TABLET | Freq: Every day | ORAL | 0 refills | Status: DC

## 2019-12-27 NOTE — Progress Notes (Signed)
Virtual Visit via Telephone Note  I connected with [name] on 12/27/19 at 11:20 AM EDT by telephone and verified that I am speaking with the correct person using two identifiers.  Location: Patient: home Provider: home office   I discussed the limitations, risks, security and privacy concerns of performing an evaluation and management service by telephone and the availability of in person appointments. I also discussed with the patient that there may be a patient responsible charge related to this service. The patient expressed understanding and agreed to proceed.   History of Present Illness: Patient is evaluated by phone session.  He was recently admitted on the medical floor because of exacerbation of COPD.  He is feeling better now.  He is getting physical therapy at home.  He also stated nursing home but recently back to home.  He admitted feeling tired and somewhat anxious today because he had home visit by physician [name]  Patient told it is a follow-up visit after he discharged from the hospital.  After that he will see his regular doctor Dr. Criss [name].  Today he took extra BuSpar to calm him down.  He has been taking his medicine as prescribed.  Usually he sleeps 7 to 8 hours without any problem.  He denies any panic attack or any anxiety attack.  Overall he feels his anxiety is under control and he is feeling better after discharge from the hospital.  He denies any crying spells or any feeling of hopelessness or worthlessness.  He lives with his wife who is very supportive.  He like to keep his mirtazapine and BuSpar to help his anxiety and depression.  His appetite is okay.  His energy level is fair.  Past Psychiatric History:Reviewed. H/Odepression since 1990. H/O suicidal attempt. TriedZoloft,Paxil,Prozac, Vistaril, trazodoneand Wellbutrin but stopped due to side effects. H/Ousing cocaine, THCand alcohol for 20 years. 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     Psychiatric Specialty Exam: Physical Exam  Review of Systems  Weight 164 lb (74.4 kg).There is no height or weight on file to calculate BMI.  General Appearance: NA  Eye Contact:  NA  Speech:  Slow  Volume:  Decreased  Mood:  Anxious and tired  Affect:  NA  Thought Process:  Goal Directed  Orientation:  Full (Time, Place, and Person)  Thought Content:  WDL  Suicidal Thoughts:  No  Homicidal Thoughts:  No  Memory:  Immediate;   Good  Judgement:  Intact  Insight:  Present  Psychomotor Activity:  NA  Concentration:  Concentration: Fair and Attention Span: Fair  Recall:  [place] of Knowledge:  Good  Language:  Good  Akathisia:  No  Handed:  Right  AIMS (if indicated):     Assets:  Communication Skills Desire for Improvement Housing Resilience Social Support  ADL's:  Intact  Cognition:  WNL  Sleep:   ok      Assessment and Plan: Major depressive disorder, recurrent.  Anxiety.  Continue BuSpar 5 mg twice a day.  I reminded that in the past we have tried 3 times a day and it make him very dizzy and tired.  Patient acknowledged and mentioned that he took today extra about now he will continue twice a day.  Discussed medication side effects and benefits.  Continue BuSpar 5 mg twice a day and mirtazapine 45 mg at bedtime.  I reviewed blood work results.  His hemoglobin A1c is 5.8.  Recommended  to call us back if is any question or any concern.  Follow-up in 3 months.  Follow Up Instructions:    I discussed the assessment and treatment plan with the patient. The patient was provided an opportunity to ask questions and all were answered. The patient agreed with the plan and demonstrated an understanding of the instructions.   The patient was advised to call back or seek an in-person evaluation if the symptoms worsen or if the condition fails to improve as anticipated.  I provided 17 minutes of non-face-to-face time during this  encounter.   Edward Nations, MD

## 2019-12-31 DIAGNOSIS — I2510 Atherosclerotic heart disease of native coronary artery without angina pectoris: Secondary | ICD-10-CM | POA: Diagnosis not present

## 2019-12-31 DIAGNOSIS — E1151 Type 2 diabetes mellitus with diabetic peripheral angiopathy without gangrene: Secondary | ICD-10-CM | POA: Diagnosis not present

## 2019-12-31 DIAGNOSIS — Z7982 Long term (current) use of aspirin: Secondary | ICD-10-CM | POA: Diagnosis not present

## 2019-12-31 DIAGNOSIS — J9621 Acute and chronic respiratory failure with hypoxia: Secondary | ICD-10-CM | POA: Diagnosis not present

## 2019-12-31 DIAGNOSIS — J441 Chronic obstructive pulmonary disease with (acute) exacerbation: Secondary | ICD-10-CM | POA: Diagnosis not present

## 2019-12-31 DIAGNOSIS — K2100 Gastro-esophageal reflux disease with esophagitis, without bleeding: Secondary | ICD-10-CM | POA: Diagnosis not present

## 2019-12-31 DIAGNOSIS — F17290 Nicotine dependence, other tobacco product, uncomplicated: Secondary | ICD-10-CM | POA: Diagnosis not present

## 2019-12-31 DIAGNOSIS — I10 Essential (primary) hypertension: Secondary | ICD-10-CM | POA: Diagnosis not present

## 2019-12-31 DIAGNOSIS — K2960 Other gastritis without bleeding: Secondary | ICD-10-CM | POA: Diagnosis not present

## 2019-12-31 DIAGNOSIS — Z9981 Dependence on supplemental oxygen: Secondary | ICD-10-CM | POA: Diagnosis not present

## 2019-12-31 DIAGNOSIS — I739 Peripheral vascular disease, unspecified: Secondary | ICD-10-CM | POA: Diagnosis not present

## 2019-12-31 DIAGNOSIS — E785 Hyperlipidemia, unspecified: Secondary | ICD-10-CM | POA: Diagnosis not present

## 2020-01-01 DIAGNOSIS — I2510 Atherosclerotic heart disease of native coronary artery without angina pectoris: Secondary | ICD-10-CM | POA: Diagnosis not present

## 2020-01-01 DIAGNOSIS — K2960 Other gastritis without bleeding: Secondary | ICD-10-CM | POA: Diagnosis not present

## 2020-01-01 DIAGNOSIS — K2100 Gastro-esophageal reflux disease with esophagitis, without bleeding: Secondary | ICD-10-CM | POA: Diagnosis not present

## 2020-01-01 DIAGNOSIS — J9621 Acute and chronic respiratory failure with hypoxia: Secondary | ICD-10-CM | POA: Diagnosis not present

## 2020-01-01 DIAGNOSIS — J441 Chronic obstructive pulmonary disease with (acute) exacerbation: Secondary | ICD-10-CM | POA: Diagnosis not present

## 2020-01-01 DIAGNOSIS — Z7982 Long term (current) use of aspirin: Secondary | ICD-10-CM | POA: Diagnosis not present

## 2020-01-01 DIAGNOSIS — E1151 Type 2 diabetes mellitus with diabetic peripheral angiopathy without gangrene: Secondary | ICD-10-CM | POA: Diagnosis not present

## 2020-01-01 DIAGNOSIS — Z9981 Dependence on supplemental oxygen: Secondary | ICD-10-CM | POA: Diagnosis not present

## 2020-01-01 DIAGNOSIS — I739 Peripheral vascular disease, unspecified: Secondary | ICD-10-CM | POA: Diagnosis not present

## 2020-01-01 DIAGNOSIS — F17290 Nicotine dependence, other tobacco product, uncomplicated: Secondary | ICD-10-CM | POA: Diagnosis not present

## 2020-01-01 DIAGNOSIS — E785 Hyperlipidemia, unspecified: Secondary | ICD-10-CM | POA: Diagnosis not present

## 2020-01-01 DIAGNOSIS — I10 Essential (primary) hypertension: Secondary | ICD-10-CM | POA: Diagnosis not present

## 2020-01-03 DIAGNOSIS — E785 Hyperlipidemia, unspecified: Secondary | ICD-10-CM | POA: Diagnosis not present

## 2020-01-03 DIAGNOSIS — I5032 Chronic diastolic (congestive) heart failure: Secondary | ICD-10-CM | POA: Diagnosis not present

## 2020-01-03 DIAGNOSIS — J441 Chronic obstructive pulmonary disease with (acute) exacerbation: Secondary | ICD-10-CM | POA: Diagnosis not present

## 2020-01-03 DIAGNOSIS — F17290 Nicotine dependence, other tobacco product, uncomplicated: Secondary | ICD-10-CM | POA: Diagnosis not present

## 2020-01-03 DIAGNOSIS — K2100 Gastro-esophageal reflux disease with esophagitis, without bleeding: Secondary | ICD-10-CM | POA: Diagnosis not present

## 2020-01-03 DIAGNOSIS — S2231XD Fracture of one rib, right side, subsequent encounter for fracture with routine healing: Secondary | ICD-10-CM | POA: Diagnosis not present

## 2020-01-03 DIAGNOSIS — I2510 Atherosclerotic heart disease of native coronary artery without angina pectoris: Secondary | ICD-10-CM | POA: Diagnosis not present

## 2020-01-03 DIAGNOSIS — E1151 Type 2 diabetes mellitus with diabetic peripheral angiopathy without gangrene: Secondary | ICD-10-CM | POA: Diagnosis not present

## 2020-01-03 DIAGNOSIS — K2960 Other gastritis without bleeding: Secondary | ICD-10-CM | POA: Diagnosis not present

## 2020-01-03 DIAGNOSIS — Z9981 Dependence on supplemental oxygen: Secondary | ICD-10-CM | POA: Diagnosis not present

## 2020-01-03 DIAGNOSIS — Z7982 Long term (current) use of aspirin: Secondary | ICD-10-CM | POA: Diagnosis not present

## 2020-01-03 DIAGNOSIS — I10 Essential (primary) hypertension: Secondary | ICD-10-CM | POA: Diagnosis not present

## 2020-01-03 DIAGNOSIS — I739 Peripheral vascular disease, unspecified: Secondary | ICD-10-CM | POA: Diagnosis not present

## 2020-01-03 DIAGNOSIS — J9621 Acute and chronic respiratory failure with hypoxia: Secondary | ICD-10-CM | POA: Diagnosis not present

## 2020-01-07 DIAGNOSIS — E1151 Type 2 diabetes mellitus with diabetic peripheral angiopathy without gangrene: Secondary | ICD-10-CM | POA: Diagnosis not present

## 2020-01-07 DIAGNOSIS — K2960 Other gastritis without bleeding: Secondary | ICD-10-CM | POA: Diagnosis not present

## 2020-01-07 DIAGNOSIS — K2100 Gastro-esophageal reflux disease with esophagitis, without bleeding: Secondary | ICD-10-CM | POA: Diagnosis not present

## 2020-01-07 DIAGNOSIS — F17290 Nicotine dependence, other tobacco product, uncomplicated: Secondary | ICD-10-CM | POA: Diagnosis not present

## 2020-01-07 DIAGNOSIS — I2510 Atherosclerotic heart disease of native coronary artery without angina pectoris: Secondary | ICD-10-CM | POA: Diagnosis not present

## 2020-01-07 DIAGNOSIS — E785 Hyperlipidemia, unspecified: Secondary | ICD-10-CM | POA: Diagnosis not present

## 2020-01-07 DIAGNOSIS — J441 Chronic obstructive pulmonary disease with (acute) exacerbation: Secondary | ICD-10-CM | POA: Diagnosis not present

## 2020-01-07 DIAGNOSIS — J9621 Acute and chronic respiratory failure with hypoxia: Secondary | ICD-10-CM | POA: Diagnosis not present

## 2020-01-07 DIAGNOSIS — Z7982 Long term (current) use of aspirin: Secondary | ICD-10-CM | POA: Diagnosis not present

## 2020-01-07 DIAGNOSIS — Z9981 Dependence on supplemental oxygen: Secondary | ICD-10-CM | POA: Diagnosis not present

## 2020-01-07 DIAGNOSIS — I10 Essential (primary) hypertension: Secondary | ICD-10-CM | POA: Diagnosis not present

## 2020-01-07 DIAGNOSIS — I739 Peripheral vascular disease, unspecified: Secondary | ICD-10-CM | POA: Diagnosis not present

## 2020-01-08 DIAGNOSIS — K2960 Other gastritis without bleeding: Secondary | ICD-10-CM | POA: Diagnosis not present

## 2020-01-08 DIAGNOSIS — E785 Hyperlipidemia, unspecified: Secondary | ICD-10-CM | POA: Diagnosis not present

## 2020-01-08 DIAGNOSIS — K2100 Gastro-esophageal reflux disease with esophagitis, without bleeding: Secondary | ICD-10-CM | POA: Diagnosis not present

## 2020-01-08 DIAGNOSIS — Z9981 Dependence on supplemental oxygen: Secondary | ICD-10-CM | POA: Diagnosis not present

## 2020-01-08 DIAGNOSIS — E1151 Type 2 diabetes mellitus with diabetic peripheral angiopathy without gangrene: Secondary | ICD-10-CM | POA: Diagnosis not present

## 2020-01-08 DIAGNOSIS — I10 Essential (primary) hypertension: Secondary | ICD-10-CM | POA: Diagnosis not present

## 2020-01-08 DIAGNOSIS — I739 Peripheral vascular disease, unspecified: Secondary | ICD-10-CM | POA: Diagnosis not present

## 2020-01-08 DIAGNOSIS — J9621 Acute and chronic respiratory failure with hypoxia: Secondary | ICD-10-CM | POA: Diagnosis not present

## 2020-01-08 DIAGNOSIS — Z7982 Long term (current) use of aspirin: Secondary | ICD-10-CM | POA: Diagnosis not present

## 2020-01-08 DIAGNOSIS — I2510 Atherosclerotic heart disease of native coronary artery without angina pectoris: Secondary | ICD-10-CM | POA: Diagnosis not present

## 2020-01-08 DIAGNOSIS — J441 Chronic obstructive pulmonary disease with (acute) exacerbation: Secondary | ICD-10-CM | POA: Diagnosis not present

## 2020-01-08 DIAGNOSIS — F17290 Nicotine dependence, other tobacco product, uncomplicated: Secondary | ICD-10-CM | POA: Diagnosis not present

## 2020-01-10 ENCOUNTER — Other Ambulatory Visit (HOSPITAL_COMMUNITY): Payer: Self-pay | Admitting: Psychiatry

## 2020-01-10 DIAGNOSIS — F330 Major depressive disorder, recurrent, mild: Secondary | ICD-10-CM

## 2020-01-10 DIAGNOSIS — F419 Anxiety disorder, unspecified: Secondary | ICD-10-CM

## 2020-01-11 ENCOUNTER — Ambulatory Visit (INDEPENDENT_AMBULATORY_CARE_PROVIDER_SITE_OTHER): Payer: Medicare Other | Admitting: Pulmonary Disease

## 2020-01-11 ENCOUNTER — Encounter: Payer: Self-pay | Admitting: Pulmonary Disease

## 2020-01-11 ENCOUNTER — Other Ambulatory Visit: Payer: Self-pay

## 2020-01-11 DIAGNOSIS — Z7189 Other specified counseling: Secondary | ICD-10-CM | POA: Insufficient documentation

## 2020-01-11 DIAGNOSIS — J441 Chronic obstructive pulmonary disease with (acute) exacerbation: Secondary | ICD-10-CM | POA: Diagnosis not present

## 2020-01-11 DIAGNOSIS — J449 Chronic obstructive pulmonary disease, unspecified: Secondary | ICD-10-CM

## 2020-01-11 DIAGNOSIS — F17200 Nicotine dependence, unspecified, uncomplicated: Secondary | ICD-10-CM | POA: Diagnosis not present

## 2020-01-11 DIAGNOSIS — J9611 Chronic respiratory failure with hypoxia: Secondary | ICD-10-CM

## 2020-01-11 MED ORDER — DOXYCYCLINE HYCLATE 100 MG PO TABS: 100.0000 mg | ORAL_TABLET | Freq: Two times a day (BID) | ORAL | 0 refills | Status: DC

## 2020-01-11 MED ORDER — PREDNISONE 10 MG PO TABS: ORAL_TABLET | ORAL | 0 refills | Status: DC

## 2020-01-11 NOTE — Assessment & Plan Note (Signed)
Plan: Continue Symbicort 160 Continue Spiriva Respimat 2.5 Doxycycline today Prednisone today Close follow-up with Dr. Melvyn Novas

## 2020-01-11 NOTE — Assessment & Plan Note (Signed)
Former cigarette smoker Current electronic cigarette user-e-cigarette cartridges 0 mg Former marijuana smoker recently quit in June/2021  Plan: Recommended the patient not smoke cigarettes, E cigarettes or marijuana

## 2020-01-11 NOTE — Assessment & Plan Note (Signed)
Plan: Doxycycline today Prednisone taper today We will coordinate close follow-up with Dr. Melvyn Novas virtually with the patient

## 2020-01-11 NOTE — Assessment & Plan Note (Signed)
New PCP was added to patient's chart Patient is homebound Encouraged patient to have new PCP: Delia Chimes, MD to route notes to Dr. Melvyn Novas to keep our office informed

## 2020-01-11 NOTE — Patient Instructions (Addendum)
You were seen today by Edward Rinne, NP  for:   1. COPD with acute exacerbation ([place]) 2. COPD mixed type ([place])  - doxycycline (VIBRA-TABS) 100 MG tablet; Take 1 tablet (100 mg total) by mouth 2 (two) times daily.  Dispense: 14 tablet; Refill: 0 - predniSONE (DELTASONE) 10 MG tablet; 4 tabs for 2 days, then 3 tabs for 2 days, 2 tabs for 2 days, then 1 tab for 2 days, then stop  Dispense: 20 tablet; Refill: 0  Doxycycline >>> 1 100 mg tablet every 12 hours for 7 days >>>take with food  >>>wear sunscreen   Prednisone 10mg  tablet  >>>4 tabs for 2 days, then 3 tabs for 2 days, 2 tabs for 2 days, then 1 tab for 2 days, then stop >>>take with food  >>>take in the morning   Continue Symbicort >>> 2 puffs in the morning right when you wake up, rinse out your mouth after use, 12 hours later 2 puffs, rinse after use >>> Take this daily, no matter what >>> This is not a rescue inhaler   Spiriva Respimat 2.5 >>> 2 puffs daily >>> Do this every day >>>This is not a rescue inhaler   Note your daily symptoms > remember "red flags" for COPD:   >>>Increase in cough >>>increase in sputum production >>>increase in shortness of breath or activity  intolerance.   If you notice these symptoms, please call the office to be seen.    3. Chronic respiratory failure with hypoxia (HCC)  Continue oxygen therapy as prescribed  >>>maintain oxygen saturations greater than 88 percent  >>>if unable to maintain oxygen saturations please contact the office  >>>do not smoke with oxygen  >>>can use nasal saline gel or nasal saline rinses to moisturize nose if oxygen causes dryness   4. Smoker  We recommend that you stop using your electronic cigarettes  5. Coordination of complex care  Please have your new primary care doctor route her notes to our office so that way Dr. Melvyn Novas in our medical team here can be informed on your health Our fax number here is: [phone]   We recommend today:   Meds  ordered this encounter  Medications  . doxycycline (VIBRA-TABS) 100 MG tablet    Sig: Take 1 tablet (100 mg total) by mouth 2 (two) times daily.    Dispense:  14 tablet    Refill:  0  . predniSONE (DELTASONE) 10 MG tablet    Sig: 4 tabs for 2 days, then 3 tabs for 2 days, 2 tabs for 2 days, then 1 tab for 2 days, then stop    Dispense:  20 tablet    Refill:  0    Follow Up:    Return in about 2 months (around 03/12/2020), or if symptoms worsen or fail to improve, for Follow up with Dr. Melvyn Novas.   Please do your part to reduce the spread of COVID-19:      Reduce your risk of any infection  and COVID19 by using the similar precautions used for avoiding the common cold or flu:  Marland Kitchen Wash your hands often with soap and warm water for at least 20 seconds.  If soap and water are not readily available, use an alcohol-based hand sanitizer with at least 60% alcohol.  . If coughing or sneezing, cover your mouth and nose by coughing or sneezing into the elbow areas of your shirt or coat, into a tissue or into your sleeve (not your hands). Marland Kitchen  WEAR A MASK when in public  . Avoid shaking hands with others and consider head nods or verbal greetings only. . Avoid touching your eyes, nose, or mouth with unwashed hands.  . Avoid close contact with people who are sick. . Avoid places or events with large numbers of people in one location, like concerts or sporting events. . If you have some symptoms but not all symptoms, continue to monitor at home and seek medical attention if your symptoms worsen. . If you are having a medical emergency, call 911.   [place] / e-Visit: [email or web address]         [facility]: [facility]: [phone]                   [facility]: [phone]     It is flu season:   >>> Best ways to protect herself from the  flu: Receive the yearly flu vaccine, practice good hand hygiene washing with soap and also using hand sanitizer when available, eat a nutritious meals, get adequate rest, hydrate appropriately   Please contact the office if your symptoms worsen or you have concerns that you are not improving.   Thank you for choosing [facility] for your healthcare, and for allowing [place] to partner with you on your healthcare journey. I am thankful to be able to provide care to you today.   Edward Quaker FNP-C

## 2020-01-11 NOTE — Progress Notes (Signed)
Virtual Visit via Telephone Note  I connected with [name] on 01/11/20 at  9:30 AM EDT by telephone and verified that I am speaking with the correct person using two identifiers.  Location: [name]: Home Provider: [facility] - [address], [place], [place], [place] [address]   I discussed the limitations, risks, security and privacy concerns of performing an evaluation and management service by telephone and the availability of in person appointments. I also discussed with the [name] that there may be a [name] responsible charge related to this service. The [name] expressed understanding and agreed to proceed.  [name] consented to consult via telephone: Yes People present and their role in [name] care: [name]     History of Present Illness:  69 year old male current everyday smoker followed in our office for COPD  Past medical history: Depression, CAD, hyperlipidemia, hypertension, type 2 diabetes Smoking history: Current smoker Maintenance: Symbicort 160, Spiriva Respimat  [name] of Dr. Melvyn Novas  Chief complaint:    69 year old male current smoker followed in our office for COPD.  [name] completing a telephonic visit today for follow-up after a COPD exacerbation in June/2021.  [name] was hospitalized for 8 days.  [name] was discharged on 11/15/2019.  An excerpt of that discharge summary is listed below:  Admit date: 11/07/2019 Discharge date: 11/15/2019  Admitted From: Home Disposition:  Home  Recommendations for Outpatient Follow-up:  1. Follow up with PCP in 1-2 weeks   Home Health:Yes Equipment/Devices:none  Discharge Condition:Stable CODE STATUS:Full Diet recommendation: Heart Healthy   Brief/Interim Summary: 69 y.o.malepast medical history of obstructive sleep apnea on CPAP, hypertension hyperlipidemia COPD on 3 L of oxygen bedbound came into the ED on 11/07/2019 with complaint of shortness of breath cough wheezing, in the ED he was found  to be satting in the 80s was started on BiPAP, chest x-ray shows slight increase in mid left basilar lung, he was started on IV empiric antibiotics Solu-Medrol magnesium, KUB showed possible outlet obstruction NG tube was placed with intermittent suction GI was consulted   Discharge Diagnoses:  Principal Problem:   Acute on chronic respiratory failure with hypoxia ([place]) Active Problems:   COPD exacerbation ([place])   Diabetes mellitus without complication ([place])   Coronary artery disease   HLD (hyperlipidemia)   HTN (hypertension)   Obstructive sleep apnea  Possible gastric outlet obstruction: NG tube was placed with intermittent suction GI was consulted who performed an EGD that showed mild esophagitis with gastritis. He was started on IV Protonix NG tube was discontinued which he tolerated his diet well. Physical therapy evaluated the [name] and recommended skilled nursing facility. The [name] refused skilled nursing facility and would like to go home with home health.  Sinus tachycardia: Resolved likely due to holding atenolol resume.  Significant colonic fecal burden: Multiple soapsuds enemas and laxatives were given now resolved.  Acute on chronic respiratory failure with hypoxia due to COPD and community-acquired pneumonia: He completed course of antibiotics in house is currently on a steroid taper.  Obstructive sleep apnea: Continue CPAP at home.  Possible UTI: Completed course of right the antibiotics in house.  Right groin wart: Follow-up with rheumatology as an outpatient.  Diabetes mellitus type 2: With an A1c of 6.1 no change made to his medication.  Essential hypertension: No changes made to his medication.  Hyperlipidemia: Continue aspirin, statins and Plavix.  Neuropsychiatric meds: Continue Remeron and BuSpar.  [name] was last seen by Dr. Melvyn Novas on 02/20/2019.  This was a  virtual visit. [name] was discharge to rehab. Went home 12/01/19.   [name]  was encouraged to contact our office by his new primary care doctor Dr. Delia Chimes.  This is a mobile health program.  He reports that he was last seen by Dr. Nolon Rod yesterday on 01/10/2020 where his vital signs were stable.  He reports that Dr. Nolon Rod felt that his breath sounds were improving and he did not have significant audible wheezing.  [name] also reports that Dr. Nolon Rod is planning on treating him with antibiotics and steroids.  And [name] was encouraged to contact our office.  [name] wanted to receive recommendations from our office about antibiotics and steroids.  [name] has noticed that has had increased shortness of breath, congestion as well as a worsening cough with thick mucus of yellow to green color.  He does not typically produce sputum at his baseline.  Unfortunately I do not have the notes from his most recent visit with his new primary care doctor.  He also reports that he is now maintained on 4 L continuous to maintain oxygen saturations above 88%.  [name] is homebound unable to present to our office for in person physical exams.  Unfortunately the [name] does still continue to vape using electronic cigarettes.  If they do not have any nicotine in the cartridges.  He is wondering if he can get a prescription for medical marijuana to stimulate his appetite.  We will discuss this today.  He is a former marijuana smoker and quit in June/2021.  Observations/Objective:  Social History   Tobacco Use  Smoking Status Current Every Day Smoker  . Packs/day: 0.25  . Years: 45.00  . Pack years: 11.25  . Types: Cigarettes, E-cigarettes  Smokeless Tobacco Never Used  Tobacco Comment   stopped smoking in 2018, but still using E-cigs   Immunization History  Administered Date(s) Administered  . Influenza Split 02/14/2013, 03/06/2019  . Influenza, High Dose Seasonal PF 02/10/2016, 03/16/2018  . Influenza,inj,Quad PF,6+ Mos 01/28/2014  . Influenza-Unspecified  01/16/2015, 03/17/2018  . Janssen (J&J) SARS-COV-2 Vaccination 10/10/2019  . Pneumococcal Conjugate-13 02/10/2016, 03/01/2017  . Pneumococcal-Unspecified 05/17/2013  . Zoster Recombinat (Shingrix) 09/16/2016, 02/19/2017      Assessment and Plan:  Smoker Former cigarette smoker Current electronic cigarette user-e-cigarette cartridges 0 mg Former marijuana smoker recently quit in June/2021  Plan: Recommended the [name] not smoke cigarettes, E cigarettes or marijuana  Chronic respiratory failure with hypoxia ([place]) Plan: Continue oxygen therapy as prescribed Maintain oxygen saturations above 88%  COPD with acute exacerbation (HCC) Plan: Doxycycline today Prednisone taper today We will coordinate close follow-up with Dr. Melvyn Novas virtually with the [name]  COPD GOLD III with reversibility  Plan: Continue Symbicort 160 Continue Spiriva Respimat 2.5 Doxycycline today Prednisone today Close follow-up with Dr. Melvyn Novas   Coordination of complex care New PCP was added to [name]'s chart [name] is homebound Encouraged [name] to have new PCP: Delia Chimes, MD to route notes to Dr. Melvyn Novas to keep our office informed   Follow Up Instructions:  Return in about 2 months (around 03/12/2020), or if symptoms worsen or fail to improve, for Follow up with Dr. Melvyn Novas.   I discussed the assessment and treatment plan with the [name]. The [name] was provided an opportunity to ask questions and all were answered. The [name] agreed with the plan and demonstrated an understanding of the instructions.   The [name] was advised to call back or seek an in-person evaluation if the symptoms worsen or if the condition  fails to improve as anticipated.  I provided 28 minutes of non-face-to-face time during this encounter.   Lauraine Rinne, NP

## 2020-01-11 NOTE — Assessment & Plan Note (Signed)
Plan: Continue oxygen therapy as prescribed Maintain oxygen saturations above 88% 

## 2020-01-14 DIAGNOSIS — F17290 Nicotine dependence, other tobacco product, uncomplicated: Secondary | ICD-10-CM | POA: Diagnosis not present

## 2020-01-14 DIAGNOSIS — K2960 Other gastritis without bleeding: Secondary | ICD-10-CM | POA: Diagnosis not present

## 2020-01-14 DIAGNOSIS — K2100 Gastro-esophageal reflux disease with esophagitis, without bleeding: Secondary | ICD-10-CM | POA: Diagnosis not present

## 2020-01-14 DIAGNOSIS — I739 Peripheral vascular disease, unspecified: Secondary | ICD-10-CM | POA: Diagnosis not present

## 2020-01-14 DIAGNOSIS — I10 Essential (primary) hypertension: Secondary | ICD-10-CM | POA: Diagnosis not present

## 2020-01-14 DIAGNOSIS — E1151 Type 2 diabetes mellitus with diabetic peripheral angiopathy without gangrene: Secondary | ICD-10-CM | POA: Diagnosis not present

## 2020-01-14 DIAGNOSIS — J441 Chronic obstructive pulmonary disease with (acute) exacerbation: Secondary | ICD-10-CM | POA: Diagnosis not present

## 2020-01-14 DIAGNOSIS — Z9981 Dependence on supplemental oxygen: Secondary | ICD-10-CM | POA: Diagnosis not present

## 2020-01-14 DIAGNOSIS — E785 Hyperlipidemia, unspecified: Secondary | ICD-10-CM | POA: Diagnosis not present

## 2020-01-14 DIAGNOSIS — J9621 Acute and chronic respiratory failure with hypoxia: Secondary | ICD-10-CM | POA: Diagnosis not present

## 2020-01-14 DIAGNOSIS — Z7982 Long term (current) use of aspirin: Secondary | ICD-10-CM | POA: Diagnosis not present

## 2020-01-14 DIAGNOSIS — I2510 Atherosclerotic heart disease of native coronary artery without angina pectoris: Secondary | ICD-10-CM | POA: Diagnosis not present

## 2020-01-15 ENCOUNTER — Other Ambulatory Visit (HOSPITAL_COMMUNITY): Payer: Self-pay | Admitting: Psychiatry

## 2020-01-15 DIAGNOSIS — J441 Chronic obstructive pulmonary disease with (acute) exacerbation: Secondary | ICD-10-CM | POA: Diagnosis not present

## 2020-01-15 DIAGNOSIS — K2100 Gastro-esophageal reflux disease with esophagitis, without bleeding: Secondary | ICD-10-CM | POA: Diagnosis not present

## 2020-01-15 DIAGNOSIS — F419 Anxiety disorder, unspecified: Secondary | ICD-10-CM

## 2020-01-15 DIAGNOSIS — K2960 Other gastritis without bleeding: Secondary | ICD-10-CM | POA: Diagnosis not present

## 2020-01-15 DIAGNOSIS — I739 Peripheral vascular disease, unspecified: Secondary | ICD-10-CM | POA: Diagnosis not present

## 2020-01-15 DIAGNOSIS — E119 Type 2 diabetes mellitus without complications: Secondary | ICD-10-CM | POA: Diagnosis not present

## 2020-01-15 DIAGNOSIS — J9621 Acute and chronic respiratory failure with hypoxia: Secondary | ICD-10-CM | POA: Diagnosis not present

## 2020-01-15 DIAGNOSIS — E785 Hyperlipidemia, unspecified: Secondary | ICD-10-CM | POA: Diagnosis not present

## 2020-01-15 DIAGNOSIS — I10 Essential (primary) hypertension: Secondary | ICD-10-CM | POA: Diagnosis not present

## 2020-01-15 DIAGNOSIS — F330 Major depressive disorder, recurrent, mild: Secondary | ICD-10-CM

## 2020-01-15 DIAGNOSIS — J449 Chronic obstructive pulmonary disease, unspecified: Secondary | ICD-10-CM | POA: Diagnosis not present

## 2020-01-15 DIAGNOSIS — Z7982 Long term (current) use of aspirin: Secondary | ICD-10-CM | POA: Diagnosis not present

## 2020-01-15 DIAGNOSIS — I2510 Atherosclerotic heart disease of native coronary artery without angina pectoris: Secondary | ICD-10-CM | POA: Diagnosis not present

## 2020-01-15 DIAGNOSIS — E1151 Type 2 diabetes mellitus with diabetic peripheral angiopathy without gangrene: Secondary | ICD-10-CM | POA: Diagnosis not present

## 2020-01-15 DIAGNOSIS — Z720 Tobacco use: Secondary | ICD-10-CM | POA: Diagnosis not present

## 2020-01-15 DIAGNOSIS — Z9981 Dependence on supplemental oxygen: Secondary | ICD-10-CM | POA: Diagnosis not present

## 2020-01-15 DIAGNOSIS — F17290 Nicotine dependence, other tobacco product, uncomplicated: Secondary | ICD-10-CM | POA: Diagnosis not present

## 2020-01-17 ENCOUNTER — Other Ambulatory Visit (HOSPITAL_COMMUNITY): Payer: Self-pay | Admitting: Psychiatry

## 2020-01-17 DIAGNOSIS — F419 Anxiety disorder, unspecified: Secondary | ICD-10-CM

## 2020-01-17 DIAGNOSIS — K2960 Other gastritis without bleeding: Secondary | ICD-10-CM | POA: Diagnosis not present

## 2020-01-17 DIAGNOSIS — L309 Dermatitis, unspecified: Secondary | ICD-10-CM | POA: Diagnosis not present

## 2020-01-17 DIAGNOSIS — F330 Major depressive disorder, recurrent, mild: Secondary | ICD-10-CM

## 2020-01-17 DIAGNOSIS — K2100 Gastro-esophageal reflux disease with esophagitis, without bleeding: Secondary | ICD-10-CM | POA: Diagnosis not present

## 2020-01-17 DIAGNOSIS — Z7902 Long term (current) use of antithrombotics/antiplatelets: Secondary | ICD-10-CM | POA: Diagnosis not present

## 2020-01-17 DIAGNOSIS — E1151 Type 2 diabetes mellitus with diabetic peripheral angiopathy without gangrene: Secondary | ICD-10-CM | POA: Diagnosis not present

## 2020-01-17 DIAGNOSIS — Z9981 Dependence on supplemental oxygen: Secondary | ICD-10-CM | POA: Diagnosis not present

## 2020-01-17 DIAGNOSIS — Z7982 Long term (current) use of aspirin: Secondary | ICD-10-CM | POA: Diagnosis not present

## 2020-01-17 DIAGNOSIS — F17290 Nicotine dependence, other tobacco product, uncomplicated: Secondary | ICD-10-CM | POA: Diagnosis not present

## 2020-01-17 DIAGNOSIS — E785 Hyperlipidemia, unspecified: Secondary | ICD-10-CM | POA: Diagnosis not present

## 2020-01-17 DIAGNOSIS — I2510 Atherosclerotic heart disease of native coronary artery without angina pectoris: Secondary | ICD-10-CM | POA: Diagnosis not present

## 2020-01-17 DIAGNOSIS — J9621 Acute and chronic respiratory failure with hypoxia: Secondary | ICD-10-CM | POA: Diagnosis not present

## 2020-01-17 DIAGNOSIS — J441 Chronic obstructive pulmonary disease with (acute) exacerbation: Secondary | ICD-10-CM | POA: Diagnosis not present

## 2020-01-17 DIAGNOSIS — Z7951 Long term (current) use of inhaled steroids: Secondary | ICD-10-CM | POA: Diagnosis not present

## 2020-01-17 DIAGNOSIS — I10 Essential (primary) hypertension: Secondary | ICD-10-CM | POA: Diagnosis not present

## 2020-01-22 DIAGNOSIS — Z9981 Dependence on supplemental oxygen: Secondary | ICD-10-CM | POA: Diagnosis not present

## 2020-01-22 DIAGNOSIS — E1151 Type 2 diabetes mellitus with diabetic peripheral angiopathy without gangrene: Secondary | ICD-10-CM | POA: Diagnosis not present

## 2020-01-22 DIAGNOSIS — I2510 Atherosclerotic heart disease of native coronary artery without angina pectoris: Secondary | ICD-10-CM | POA: Diagnosis not present

## 2020-01-22 DIAGNOSIS — L309 Dermatitis, unspecified: Secondary | ICD-10-CM | POA: Diagnosis not present

## 2020-01-22 DIAGNOSIS — F17290 Nicotine dependence, other tobacco product, uncomplicated: Secondary | ICD-10-CM | POA: Diagnosis not present

## 2020-01-22 DIAGNOSIS — E785 Hyperlipidemia, unspecified: Secondary | ICD-10-CM | POA: Diagnosis not present

## 2020-01-22 DIAGNOSIS — K2100 Gastro-esophageal reflux disease with esophagitis, without bleeding: Secondary | ICD-10-CM | POA: Diagnosis not present

## 2020-01-22 DIAGNOSIS — J441 Chronic obstructive pulmonary disease with (acute) exacerbation: Secondary | ICD-10-CM | POA: Diagnosis not present

## 2020-01-22 DIAGNOSIS — J9621 Acute and chronic respiratory failure with hypoxia: Secondary | ICD-10-CM | POA: Diagnosis not present

## 2020-01-22 DIAGNOSIS — Z7902 Long term (current) use of antithrombotics/antiplatelets: Secondary | ICD-10-CM | POA: Diagnosis not present

## 2020-01-22 DIAGNOSIS — Z7951 Long term (current) use of inhaled steroids: Secondary | ICD-10-CM | POA: Diagnosis not present

## 2020-01-22 DIAGNOSIS — K2960 Other gastritis without bleeding: Secondary | ICD-10-CM | POA: Diagnosis not present

## 2020-01-22 DIAGNOSIS — I10 Essential (primary) hypertension: Secondary | ICD-10-CM | POA: Diagnosis not present

## 2020-01-22 DIAGNOSIS — Z7982 Long term (current) use of aspirin: Secondary | ICD-10-CM | POA: Diagnosis not present

## 2020-01-23 DIAGNOSIS — E1151 Type 2 diabetes mellitus with diabetic peripheral angiopathy without gangrene: Secondary | ICD-10-CM | POA: Diagnosis not present

## 2020-01-23 DIAGNOSIS — Z7982 Long term (current) use of aspirin: Secondary | ICD-10-CM | POA: Diagnosis not present

## 2020-01-23 DIAGNOSIS — Z9981 Dependence on supplemental oxygen: Secondary | ICD-10-CM | POA: Diagnosis not present

## 2020-01-23 DIAGNOSIS — L309 Dermatitis, unspecified: Secondary | ICD-10-CM | POA: Diagnosis not present

## 2020-01-23 DIAGNOSIS — J9621 Acute and chronic respiratory failure with hypoxia: Secondary | ICD-10-CM | POA: Diagnosis not present

## 2020-01-23 DIAGNOSIS — I2510 Atherosclerotic heart disease of native coronary artery without angina pectoris: Secondary | ICD-10-CM | POA: Diagnosis not present

## 2020-01-23 DIAGNOSIS — F17290 Nicotine dependence, other tobacco product, uncomplicated: Secondary | ICD-10-CM | POA: Diagnosis not present

## 2020-01-23 DIAGNOSIS — E785 Hyperlipidemia, unspecified: Secondary | ICD-10-CM | POA: Diagnosis not present

## 2020-01-23 DIAGNOSIS — Z7951 Long term (current) use of inhaled steroids: Secondary | ICD-10-CM | POA: Diagnosis not present

## 2020-01-23 DIAGNOSIS — K2100 Gastro-esophageal reflux disease with esophagitis, without bleeding: Secondary | ICD-10-CM | POA: Diagnosis not present

## 2020-01-23 DIAGNOSIS — J441 Chronic obstructive pulmonary disease with (acute) exacerbation: Secondary | ICD-10-CM | POA: Diagnosis not present

## 2020-01-23 DIAGNOSIS — I10 Essential (primary) hypertension: Secondary | ICD-10-CM | POA: Diagnosis not present

## 2020-01-23 DIAGNOSIS — K2960 Other gastritis without bleeding: Secondary | ICD-10-CM | POA: Diagnosis not present

## 2020-01-23 DIAGNOSIS — Z7902 Long term (current) use of antithrombotics/antiplatelets: Secondary | ICD-10-CM | POA: Diagnosis not present

## 2020-01-25 DIAGNOSIS — Z7982 Long term (current) use of aspirin: Secondary | ICD-10-CM | POA: Diagnosis not present

## 2020-01-25 DIAGNOSIS — E1151 Type 2 diabetes mellitus with diabetic peripheral angiopathy without gangrene: Secondary | ICD-10-CM | POA: Diagnosis not present

## 2020-01-25 DIAGNOSIS — E785 Hyperlipidemia, unspecified: Secondary | ICD-10-CM | POA: Diagnosis not present

## 2020-01-25 DIAGNOSIS — Z9981 Dependence on supplemental oxygen: Secondary | ICD-10-CM | POA: Diagnosis not present

## 2020-01-25 DIAGNOSIS — Z7951 Long term (current) use of inhaled steroids: Secondary | ICD-10-CM | POA: Diagnosis not present

## 2020-01-25 DIAGNOSIS — Z7902 Long term (current) use of antithrombotics/antiplatelets: Secondary | ICD-10-CM | POA: Diagnosis not present

## 2020-01-25 DIAGNOSIS — L309 Dermatitis, unspecified: Secondary | ICD-10-CM | POA: Diagnosis not present

## 2020-01-25 DIAGNOSIS — K2960 Other gastritis without bleeding: Secondary | ICD-10-CM | POA: Diagnosis not present

## 2020-01-25 DIAGNOSIS — F17290 Nicotine dependence, other tobacco product, uncomplicated: Secondary | ICD-10-CM | POA: Diagnosis not present

## 2020-01-25 DIAGNOSIS — I10 Essential (primary) hypertension: Secondary | ICD-10-CM | POA: Diagnosis not present

## 2020-01-25 DIAGNOSIS — K2100 Gastro-esophageal reflux disease with esophagitis, without bleeding: Secondary | ICD-10-CM | POA: Diagnosis not present

## 2020-01-25 DIAGNOSIS — J9621 Acute and chronic respiratory failure with hypoxia: Secondary | ICD-10-CM | POA: Diagnosis not present

## 2020-01-25 DIAGNOSIS — I2510 Atherosclerotic heart disease of native coronary artery without angina pectoris: Secondary | ICD-10-CM | POA: Diagnosis not present

## 2020-01-25 DIAGNOSIS — J441 Chronic obstructive pulmonary disease with (acute) exacerbation: Secondary | ICD-10-CM | POA: Diagnosis not present

## 2020-01-28 DIAGNOSIS — E785 Hyperlipidemia, unspecified: Secondary | ICD-10-CM | POA: Diagnosis not present

## 2020-01-28 DIAGNOSIS — K2100 Gastro-esophageal reflux disease with esophagitis, without bleeding: Secondary | ICD-10-CM | POA: Diagnosis not present

## 2020-01-28 DIAGNOSIS — J9621 Acute and chronic respiratory failure with hypoxia: Secondary | ICD-10-CM | POA: Diagnosis not present

## 2020-01-28 DIAGNOSIS — Z7982 Long term (current) use of aspirin: Secondary | ICD-10-CM | POA: Diagnosis not present

## 2020-01-28 DIAGNOSIS — Z9981 Dependence on supplemental oxygen: Secondary | ICD-10-CM | POA: Diagnosis not present

## 2020-01-28 DIAGNOSIS — F17290 Nicotine dependence, other tobacco product, uncomplicated: Secondary | ICD-10-CM | POA: Diagnosis not present

## 2020-01-28 DIAGNOSIS — E1151 Type 2 diabetes mellitus with diabetic peripheral angiopathy without gangrene: Secondary | ICD-10-CM | POA: Diagnosis not present

## 2020-01-28 DIAGNOSIS — Z7902 Long term (current) use of antithrombotics/antiplatelets: Secondary | ICD-10-CM | POA: Diagnosis not present

## 2020-01-28 DIAGNOSIS — L309 Dermatitis, unspecified: Secondary | ICD-10-CM | POA: Diagnosis not present

## 2020-01-28 DIAGNOSIS — J441 Chronic obstructive pulmonary disease with (acute) exacerbation: Secondary | ICD-10-CM | POA: Diagnosis not present

## 2020-01-28 DIAGNOSIS — I2510 Atherosclerotic heart disease of native coronary artery without angina pectoris: Secondary | ICD-10-CM | POA: Diagnosis not present

## 2020-01-28 DIAGNOSIS — K2960 Other gastritis without bleeding: Secondary | ICD-10-CM | POA: Diagnosis not present

## 2020-01-28 DIAGNOSIS — I10 Essential (primary) hypertension: Secondary | ICD-10-CM | POA: Diagnosis not present

## 2020-01-28 DIAGNOSIS — Z7951 Long term (current) use of inhaled steroids: Secondary | ICD-10-CM | POA: Diagnosis not present

## 2020-01-29 ENCOUNTER — Telehealth: Payer: Self-pay | Admitting: Internal Medicine

## 2020-01-29 DIAGNOSIS — E1151 Type 2 diabetes mellitus with diabetic peripheral angiopathy without gangrene: Secondary | ICD-10-CM | POA: Diagnosis not present

## 2020-01-29 DIAGNOSIS — K2960 Other gastritis without bleeding: Secondary | ICD-10-CM | POA: Diagnosis not present

## 2020-01-29 DIAGNOSIS — L309 Dermatitis, unspecified: Secondary | ICD-10-CM | POA: Diagnosis not present

## 2020-01-29 DIAGNOSIS — E785 Hyperlipidemia, unspecified: Secondary | ICD-10-CM | POA: Diagnosis not present

## 2020-01-29 DIAGNOSIS — Z7951 Long term (current) use of inhaled steroids: Secondary | ICD-10-CM | POA: Diagnosis not present

## 2020-01-29 DIAGNOSIS — K2100 Gastro-esophageal reflux disease with esophagitis, without bleeding: Secondary | ICD-10-CM | POA: Diagnosis not present

## 2020-01-29 DIAGNOSIS — I10 Essential (primary) hypertension: Secondary | ICD-10-CM | POA: Diagnosis not present

## 2020-01-29 DIAGNOSIS — Z9981 Dependence on supplemental oxygen: Secondary | ICD-10-CM | POA: Diagnosis not present

## 2020-01-29 DIAGNOSIS — I2510 Atherosclerotic heart disease of native coronary artery without angina pectoris: Secondary | ICD-10-CM | POA: Diagnosis not present

## 2020-01-29 DIAGNOSIS — Z7902 Long term (current) use of antithrombotics/antiplatelets: Secondary | ICD-10-CM | POA: Diagnosis not present

## 2020-01-29 DIAGNOSIS — J9621 Acute and chronic respiratory failure with hypoxia: Secondary | ICD-10-CM | POA: Diagnosis not present

## 2020-01-29 DIAGNOSIS — J441 Chronic obstructive pulmonary disease with (acute) exacerbation: Secondary | ICD-10-CM | POA: Diagnosis not present

## 2020-01-29 DIAGNOSIS — F17290 Nicotine dependence, other tobacco product, uncomplicated: Secondary | ICD-10-CM | POA: Diagnosis not present

## 2020-01-29 DIAGNOSIS — Z7982 Long term (current) use of aspirin: Secondary | ICD-10-CM | POA: Diagnosis not present

## 2020-01-29 MED ORDER — PREDNISONE 10 MG PO TABS: ORAL_TABLET | ORAL | 0 refills | Status: AC

## 2020-01-29 NOTE — Telephone Encounter (Signed)
01/29/2020  Patient was seen virtually at last visit.  This is difficult for [place] to fully manage.  He was just given prednisone in August/2021.    Okay to prescribe:  Prednisone 10mg  tablet  >>>4 tabs for 2 days, then 3 tabs for 2 days, 2 tabs for 2 days, then 1 tab for 2 days, then stop >>>take with food  >>>take in the morning   Please see if patient's appointment can be moved up with Dr. Melvyn Novas this needs to be an in office visit.  Patient also needs to coordinate a home visit from primary care Dr. Nolon Rod who can visit him in his home.  Please route to Dr. Melvyn Novas as Juluis Rainier.  If symptoms worsen before being seen in our office patient needs to contact 911 or present to an emergency room for further evaluation.  Wyn Quaker, FNP

## 2020-01-29 NOTE — Telephone Encounter (Signed)
Called spoke with patient and he states that he is unable to walk right now and can't get out of the house. He believes he will be able to work and hopes by his appointment on 03/11/20.  He doesn't want to move it up and not be able to get out of the house for the in office visit. Told patient about home visit from his Primary care and he said okay.   Prednisone sent in to pharmacy verified by patient.   Nothing further needed at this time.   Will let BPM know as an [place]

## 2020-01-29 NOTE — Telephone Encounter (Signed)
Primary Pulmonologist:Dr.WQert Last office visit and with whom: 01/11/20 Virl Cagey What do we see them for (pulmonary problems):  COPD Last OV assessment/plan:COPD  Assessment and Plan:  Smoker Former cigarette smoker Current electronic cigarette user-e-cigarette cartridges 0 mg Former marijuana smoker recently quit in June/2021  Plan: Recommended the patient not smoke cigarettes, E cigarettes or marijuana  Chronic respiratory failure with hypoxia ([place]) Plan: Continue oxygen therapy as prescribed Maintain oxygen saturations above 88%  COPD with acute exacerbation (HCC) Plan: Doxycycline today Prednisone taper today We will coordinate close follow-up with Dr. Melvyn Novas virtually with the patient  COPD GOLD III with reversibility  Plan: Continue Symbicort 160 Continue Spiriva Respimat 2.5 Doxycycline today Prednisone today Close follow-up with Dr. Melvyn Novas   Coordination of complex care New PCP was added to patient's chart Patient is homebound Encouraged patient to have new PCP: Delia Chimes, MD to route notes to Dr. Melvyn Novas to keep our office informed   Follow Up Instructions:  Return in about 2 months (around 03/12/2020), or if symptoms worsen or fail to improve, for Follow up with Dr. Melvyn Novas.  I discussed the assessment and treatment plan with the patient. The patient was provided an opportunity to ask questions and all were answered. The patient agreed with the plan and demonstrated an understanding of the instructions.  The patient was advised to call back or seek an in-person evaluation if the symptoms worsen or if the condition fails to improve as anticipated.  I provided 28 minutes of non-face-to-face time during this encounter.    Was appointment offered to patient (explain)?     Reason for call: Congestion with cough. Symptoms stated about 3 days ago .0 fever and taken mucinex 1200 a day. Patient would like a script sent into his pharmacy for prednisone.    Dr.Wert can you please advise.  Thank you   No Known Allergies  Immunization History  Administered Date(s) Administered  . Influenza Split 02/14/2013, 03/06/2019  . Influenza, High Dose Seasonal PF 02/10/2016, 03/16/2018  . Influenza,inj,Quad PF,6+ Mos 01/28/2014  . Influenza-Unspecified 01/16/2015, 03/17/2018  . Janssen (J&J) SARS-COV-2 Vaccination 10/10/2019  . Pneumococcal Conjugate-13 02/10/2016, 03/01/2017  . Pneumococcal-Unspecified 05/17/2013  . Zoster Recombinat (Shingrix) 09/16/2016, 02/19/2017

## 2020-01-29 NOTE — Telephone Encounter (Signed)
Seen by [facility] NP 2 weeks ago

## 2020-01-31 DIAGNOSIS — Z7951 Long term (current) use of inhaled steroids: Secondary | ICD-10-CM | POA: Diagnosis not present

## 2020-01-31 DIAGNOSIS — Z7982 Long term (current) use of aspirin: Secondary | ICD-10-CM | POA: Diagnosis not present

## 2020-01-31 DIAGNOSIS — I2510 Atherosclerotic heart disease of native coronary artery without angina pectoris: Secondary | ICD-10-CM | POA: Diagnosis not present

## 2020-01-31 DIAGNOSIS — E785 Hyperlipidemia, unspecified: Secondary | ICD-10-CM | POA: Diagnosis not present

## 2020-01-31 DIAGNOSIS — K2100 Gastro-esophageal reflux disease with esophagitis, without bleeding: Secondary | ICD-10-CM | POA: Diagnosis not present

## 2020-01-31 DIAGNOSIS — L309 Dermatitis, unspecified: Secondary | ICD-10-CM | POA: Diagnosis not present

## 2020-01-31 DIAGNOSIS — F17290 Nicotine dependence, other tobacco product, uncomplicated: Secondary | ICD-10-CM | POA: Diagnosis not present

## 2020-01-31 DIAGNOSIS — E1151 Type 2 diabetes mellitus with diabetic peripheral angiopathy without gangrene: Secondary | ICD-10-CM | POA: Diagnosis not present

## 2020-01-31 DIAGNOSIS — Z7902 Long term (current) use of antithrombotics/antiplatelets: Secondary | ICD-10-CM | POA: Diagnosis not present

## 2020-01-31 DIAGNOSIS — Z9981 Dependence on supplemental oxygen: Secondary | ICD-10-CM | POA: Diagnosis not present

## 2020-01-31 DIAGNOSIS — K2960 Other gastritis without bleeding: Secondary | ICD-10-CM | POA: Diagnosis not present

## 2020-01-31 DIAGNOSIS — I10 Essential (primary) hypertension: Secondary | ICD-10-CM | POA: Diagnosis not present

## 2020-01-31 DIAGNOSIS — J441 Chronic obstructive pulmonary disease with (acute) exacerbation: Secondary | ICD-10-CM | POA: Diagnosis not present

## 2020-01-31 DIAGNOSIS — J9621 Acute and chronic respiratory failure with hypoxia: Secondary | ICD-10-CM | POA: Diagnosis not present

## 2020-02-01 ENCOUNTER — Telehealth: Payer: Self-pay | Admitting: Internal Medicine

## 2020-02-01 MED ORDER — AZITHROMYCIN 250 MG PO TABS: ORAL_TABLET | ORAL | 0 refills | Status: DC

## 2020-02-01 NOTE — Telephone Encounter (Signed)
Pt returning a phone call. Pt can be reached at [phone].

## 2020-02-01 NOTE — Telephone Encounter (Signed)
Left a voicemail for patient to call our office back regarding prior message.  °

## 2020-02-01 NOTE — Telephone Encounter (Signed)
Primary Pulmonologist: Dr.Wert Last office visit and with whom: 01/11/20 Virl Cagey What do we see them for (pulmonary problems): COPD Last OV assessment/plan:  Assessment and Plan:  Smoker Former cigarette smoker Current electronic cigarette user-e-cigarette cartridges 0 mg Former marijuana smoker recently quit in June/2021  Plan: Recommended the patient not smoke cigarettes, E cigarettes or marijuana  Chronic respiratory failure with hypoxia ([place]) Plan: Continue oxygen therapy as prescribed Maintain oxygen saturations above 88%  COPD with acute exacerbation (HCC) Plan: Doxycycline today Prednisone taper today We will coordinate close follow-up with Dr. Melvyn Novas virtually with the patient  COPD GOLD III with reversibility  Plan: Continue Symbicort 160 Continue Spiriva Respimat 2.5 Doxycycline today Prednisone today Close follow-up with Dr. Melvyn Novas   Coordination of complex care New PCP was added to patient's chart Patient is homebound Encouraged patient to have new PCP: Delia Chimes, MD to route notes to Dr. Melvyn Novas to keep our office informed   Follow Up Instructions:  Return in about 2 months (around 03/12/2020), or if symptoms worsen or fail to improve, for Follow up with Dr. Melvyn Novas.  I discussed the assessment and treatment plan with the patient. The patient was provided an opportunity to ask questions and all were answered. The patient agreed with the plan and demonstrated an understanding of the instructions.  The patient was advised to call back or seek an in-person evaluation if the symptoms worsen or if the condition fails to improve as anticipated.  I provided 28 minutes of non-face-to-face time during this encounter.   Lauraine Rinne, NP      Patient Instructions by Lauraine Rinne, NP at 01/11/2020 9:30 AM Author: Lauraine Rinne, NP Author Type: Nurse Practitioner Filed: 01/11/2020 9:45 AM  Note Status: Addendum Cosign: Cosign Not Required Encounter  Date: 01/11/2020  Editor: Lauraine Rinne, NP (Nurse Practitioner)      Prior Versions: 1. Lauraine Rinne, NP (Nurse Practitioner) at 01/11/2020 9:45 AM - Addendum   2. Lauraine Rinne, NP (Nurse Practitioner) at 01/11/2020 9:42 AM - Signed    You were seen today by Lauraine Rinne, NP  for:   1. COPD with acute exacerbation ([place]) 2. COPD mixed type ([place])  - doxycycline (VIBRA-TABS) 100 MG tablet; Take 1 tablet (100 mg total) by mouth 2 (two) times daily.  Dispense: 14 tablet; Refill: 0 - predniSONE (DELTASONE) 10 MG tablet; 4 tabs for 2 days, then 3 tabs for 2 days, 2 tabs for 2 days, then 1 tab for 2 days, then stop  Dispense: 20 tablet; Refill: 0  Doxycycline >>> 1 100 mg tablet every 12 hours for 7 days >>>take with food  >>>wear sunscreen   Prednisone 10mg  tablet  >>>4 tabs for 2 days, then 3 tabs for 2 days, 2 tabs for 2 days, then 1 tab for 2 days, then stop >>>take with food  >>>take in the morning   Continue Symbicort >>> 2 puffs in the morning right when you wake up, rinse out your mouth after use, 12 hours later 2 puffs, rinse after use >>> Take this daily, no matter what >>> This is not a rescue inhaler   Spiriva Respimat 2.5 >>> 2 puffs daily >>> Do this every day >>>This is not a rescue inhaler   Note your daily symptoms >remember "red flags" for COPD:  >>>Increase in cough >>>increase in sputum production >>>increase in shortness of breath or activity  intolerance.   If you notice these symptoms, please call the office to  be seen.    3. Chronic respiratory failure with hypoxia (HCC)  Continue oxygen therapy as prescribed  >>>maintain oxygen saturations greater than 88 percent  >>>if unable to maintain oxygen saturations please contact the office  >>>do not smoke with oxygen  >>>can use nasal saline gel or nasal saline rinses to moisturize nose if oxygen causes dryness   4. Smoker  We recommend that you stop using your electronic  cigarettes  5. Coordination of complex care  Please have your new primary care doctor route her notes to our office so that way Dr. Melvyn Novas in our medical team here can be informed on your health Our fax number here is: [phone]   We recommend today:       Meds ordered this encounter  Medications  . doxycycline (VIBRA-TABS) 100 MG tablet    Sig: Take 1 tablet (100 mg total) by mouth 2 (two) times daily.    Dispense:  14 tablet    Refill:  0  . predniSONE (DELTASONE) 10 MG tablet    Sig: 4 tabs for 2 days, then 3 tabs for 2 days, 2 tabs for 2 days, then 1 tab for 2 days, then stop    Dispense:  20 tablet    Refill:  0    Follow Up:    Return in about 2 months (around 03/12/2020), or if symptoms worsen or fail to improve, for Follow up with Dr. Melvyn Novas.   Please do your part to reduce the spread of COVID-19:      Reduce your risk of any infection  and COVID19 by using the similar precautions used for avoiding the common cold or flu:  Wash your hands often with soap and warm water for at least 20 seconds.  If soap and water are not readily available, use an alcohol-based hand sanitizer with at least 60% alcohol.   If coughing or sneezing, cover your mouth and nose by coughing or sneezing into the elbow areas of your shirt or coat, into a tissue or into your sleeve (not your hands).  WEAR A MASK when in public   Avoid shaking hands with others and consider head nods or verbal greetings only.  Avoid touching your eyes,nose, or mouth with unwashed hands.  Avoid close contact with people who are sick.  Avoid places or events with large numbers of people in one location, like concerts or sporting events.  If you have some symptoms but not all symptoms, continue to monitor at home and seek medical attention if your symptoms worsen.  If you are having a medical emergency, call 911.   [place] / e-Visit: [email or web address]         [facility]: [facility]: [phone]                   [facility]: [phone]     It is flu season:   >>> Best ways to protect herself from the flu: Receive the yearly flu vaccine, practice good hand hygiene washing with soap and also using hand sanitizer when available, eat a nutritious meals, get adequate rest, hydrate appropriately   Please contact the office if your symptoms worsen or you have concerns that you are not improving.   Thank you for choosing [facility] for your healthcare, and for allowing [place] to partner with you on your healthcare journey. I am thankful to be  able to provide care to you today.   Wyn Quaker FNP-C     Assessment & Plan Note by Lauraine Rinne, NP at 01/11/2020 9:42 AM Author: Lauraine Rinne, NP Author Type: Nurse Practitioner Filed: 01/11/2020 9:42 AM  Note Status: Written Cosign: Cosign Not Required Encounter Date: 01/11/2020  Problem: Coordination of complex care  Editor: Lauraine Rinne, NP (Nurse Practitioner)               New PCP was added to patient's chart Patient is homebound Encouraged patient to have new PCP: Delia Chimes, MD to route notes to Dr. Melvyn Novas to keep our office informed    Assessment & Plan Note by Lauraine Rinne, NP at 01/11/2020 9:41 AM Author: Lauraine Rinne, NP Author Type: Nurse Practitioner Filed: 01/11/2020 9:41 AM  Note Status: Written Cosign: Cosign Not Required Encounter Date: 01/11/2020  Problem: COPD GOLD III with reversibility   Editor: Lauraine Rinne, NP (Nurse Practitioner)               Plan: Continue Symbicort 160 Continue Spiriva Respimat 2.5 Doxycycline today Prednisone today Close follow-up with Dr. Melvyn Novas     Assessment & Plan Note by Lauraine Rinne, NP at 01/11/2020 9:41 AM Author: Lauraine Rinne, NP Author Type: Nurse Practitioner Filed:  01/11/2020 9:41 AM  Note Status: Written Cosign: Cosign Not Required Encounter Date: 01/11/2020  Problem: COPD with acute exacerbation [facility])  Editor: Lauraine Rinne, NP (Nurse Practitioner)               Plan: Doxycycline today Prednisone taper today We will coordinate close follow-up with Dr. Melvyn Novas virtually with the patient    Assessment & Plan Note by Lauraine Rinne, NP at 01/11/2020 9:40 AM Author: Lauraine Rinne, NP Author Type: Nurse Practitioner Filed: 01/11/2020 9:40 AM  Note Status: Written Cosign: Cosign Not Required Encounter Date: 01/11/2020  Problem: Chronic respiratory failure with hypoxia [facility])  Editor: Lauraine Rinne, NP (Nurse Practitioner)               Plan: Continue oxygen therapy as prescribed Maintain oxygen saturations above 88%    Assessment & Plan Note by Lauraine Rinne, NP at 01/11/2020 9:40 AM Author: Lauraine Rinne, NP Author Type: Nurse Practitioner Filed: 01/11/2020 9:40 AM  Note Status: Written Cosign: Cosign Not Required Encounter Date: 01/11/2020  Problem: Smoker  Editor: Lauraine Rinne, NP (Nurse Practitioner)               Former cigarette smoker Current electronic cigarette user-e-cigarette cartridges 0 mg Former marijuana smoker recently quit in June/2021  Plan: Recommended the patient not smoke cigarettes, E cigarettes or marijuana    Instructions    Return in about 2 months (around 03/12/2020), or if symptoms worsen or fail to improve, for Follow up with Dr. Melvyn Novas. You were seen today by Lauraine Rinne, NP  for:   1. COPD with acute exacerbation ([place]) 2. COPD mixed type ([place])  - doxycycline (VIBRA-TABS) 100 MG tablet; Take 1 tablet (100 mg total) by mouth 2 (two) times daily.  Dispense: 14 tablet; Refill: 0 - predniSONE (DELTASONE) 10 MG tablet; 4 tabs for 2 days, then 3 tabs for 2 days, 2 tabs for 2 days, then 1 tab for 2 days, then stop  Dispense: 20 tablet; Refill: 0  Doxycycline >>> 1 100 mg tablet every 12 hours for 7 days >>>take  with food  >>>wear sunscreen   Prednisone 10mg  tablet  >>>  4 tabs for 2 days, then 3 tabs for 2 days, 2 tabs for 2 days, then 1 tab for 2 days, then stop >>>take with food  >>>take in the morning   Continue Symbicort >>> 2 puffs in the morning right when you wake up, rinse out your mouth after use, 12 hours later 2 puffs, rinse after use >>> Take this daily, no matter what >>> This is not a rescue inhaler   Spiriva Respimat 2.5 >>> 2 puffs daily >>> Do this every day >>>This is not a rescue inhaler   Note your daily symptoms >remember "red flags" for COPD:  >>>Increase in cough >>>increase in sputum production >>>increase in shortness of breath or activity  intolerance.   If you notice these symptoms, please call the office to be seen.    3. Chronic respiratory failure with hypoxia (HCC)  Continue oxygen therapy as prescribed  >>>maintain oxygen saturations greater than 88 percent  >>>if unable to maintain oxygen saturations please contact the office  >>>do not smoke with oxygen  >>>can use nasal saline gel or nasal saline rinses to moisturize nose if oxygen causes dryness   4. Smoker  We recommend that you stop using your electronic cigarettes  5. Coordination of complex care  Please have your new primary care doctor route her notes to our office so that way Dr. Melvyn Novas in our medical team here can be informed on your health Our fax number here is: [phone]   We recommend today:       Meds ordered this encounter  Medications  . doxycycline (VIBRA-TABS) 100 MG tablet    Sig: Take 1 tablet (100 mg total) by mouth 2 (two) times daily.    Dispense:  14 tablet    Refill:  0  . predniSONE (DELTASONE) 10 MG tablet    Sig: 4 tabs for 2 days, then 3 tabs for 2 days, 2 tabs for 2 days, then 1 tab for 2 days, then stop    Dispense:  20 tablet    Refill:  0    Follow Up:    Return in about 2 months (around 03/12/2020), or if  symptoms worsen or fail to improve, for Follow up with Dr. Melvyn Novas.   Please do your part to reduce the spread of COVID-19:      Reduce your risk of any infection  and COVID19 by using the similar precautions used for avoiding the common cold or flu:  Wash your hands often with soap and warm water for at least 20 seconds.  If soap and water are not readily available, use an alcohol-based hand sanitizer with at least 60% alcohol.   If coughing or sneezing, cover your mouth and nose by coughing or sneezing into the elbow areas of your shirt or coat, into a tissue or into your sleeve (not your hands).  WEAR A MASK when in public   Avoid shaking hands with others and consider head nods or verbal greetings only.  Avoid touching your eyes,nose, or mouth with unwashed hands.  Avoid close contact with people who are sick.  Avoid places or events with large numbers of people in one location, like concerts or sporting events.  If you have some symptoms but not all symptoms, continue to monitor at home and seek medical attention if your symptoms worsen.  If you are having a medical emergency, call 911.   ADDITIONAL HEALTHCARE OPTIONS FOR PATIENTS  [place] [facility] / e-Visit: [email or web address]  [facility]: [facility]: [phone]                   [facility]: [phone]     It is flu season:   >>> Best ways to protect herself from the flu: Receive the yearly flu vaccine, practice good hand hygiene washing with soap and also using hand sanitizer when available, eat a nutritious meals, get adequate rest, hydrate appropriately   Please contact the office if your symptoms worsen or you have concerns that you are not improving.   Thank you for choosing [facility] for your healthcare, and for allowing [place] to partner with you on your healthcare  journey. I am thankful to be able to provide care to you today.   Wyn Quaker FNP-C         After Visit Summary (Printed 01/11/2020) Communications    [facility] Provider CC Chart Rep sent to Lucianne Lei, MD   Chart Routed to Tanda Rockers, MD   Chart Routed to Tanda Rockers, MD Communication Routing History  Recipient Method Sent by Date Sent  Lucianne Lei, MD Fax Lauraine Rinne, NP 01/11/2020  Fax: [phone]  Phone: [phone]   No questionnaires available.           Was appointment offered to patient (explain)?     Reason for call: Cough with some mucus for the past 3 days. o2 5L o2 staying at 90%.0 fever. Patient is on prednisone taper .Patient's phone is not working can leave a message on voicemail regarding this message.    Dr.Wert can you please advised    Thank you  No Known Allergies  Immunization History  Administered Date(s) Administered  . Influenza Split 02/14/2013, 03/06/2019  . Influenza, High Dose Seasonal PF 02/10/2016, 03/16/2018  . Influenza,inj,Quad PF,6+ Mos 01/28/2014  . Influenza-Unspecified 01/16/2015, 03/17/2018  . Janssen (J&J) SARS-COV-2 Vaccination 10/10/2019  . Pneumococcal Conjugate-13 02/10/2016, 03/01/2017  . Pneumococcal-Unspecified 05/17/2013  . Zoster Recombinat (Shingrix) 09/16/2016, 02/19/2017

## 2020-02-01 NOTE — Telephone Encounter (Signed)
Attempted to call pt but line went straight to VM. Left message for him to return call. 

## 2020-02-01 NOTE — Telephone Encounter (Signed)
I called in zpak

## 2020-02-01 NOTE — Telephone Encounter (Signed)
Spoke with patient regarding prior message.Advised patient that Dr.Wert sent into his pharmacy Zpack. Patient's voice was understanding. Nothing else further needed.

## 2020-02-03 DIAGNOSIS — S2231XD Fracture of one rib, right side, subsequent encounter for fracture with routine healing: Secondary | ICD-10-CM | POA: Diagnosis not present

## 2020-02-03 DIAGNOSIS — I5032 Chronic diastolic (congestive) heart failure: Secondary | ICD-10-CM | POA: Diagnosis not present

## 2020-02-03 DIAGNOSIS — J9621 Acute and chronic respiratory failure with hypoxia: Secondary | ICD-10-CM | POA: Diagnosis not present

## 2020-02-03 DIAGNOSIS — J441 Chronic obstructive pulmonary disease with (acute) exacerbation: Secondary | ICD-10-CM | POA: Diagnosis not present

## 2020-02-04 ENCOUNTER — Other Ambulatory Visit (HOSPITAL_COMMUNITY): Payer: Self-pay | Admitting: Psychiatry

## 2020-02-04 DIAGNOSIS — F419 Anxiety disorder, unspecified: Secondary | ICD-10-CM

## 2020-02-04 DIAGNOSIS — F330 Major depressive disorder, recurrent, mild: Secondary | ICD-10-CM

## 2020-02-05 ENCOUNTER — Telehealth: Payer: Self-pay | Admitting: Internal Medicine

## 2020-02-05 DIAGNOSIS — Z9981 Dependence on supplemental oxygen: Secondary | ICD-10-CM | POA: Diagnosis not present

## 2020-02-05 DIAGNOSIS — I2510 Atherosclerotic heart disease of native coronary artery without angina pectoris: Secondary | ICD-10-CM | POA: Diagnosis not present

## 2020-02-05 DIAGNOSIS — K2960 Other gastritis without bleeding: Secondary | ICD-10-CM | POA: Diagnosis not present

## 2020-02-05 DIAGNOSIS — F17290 Nicotine dependence, other tobacco product, uncomplicated: Secondary | ICD-10-CM | POA: Diagnosis not present

## 2020-02-05 DIAGNOSIS — K2100 Gastro-esophageal reflux disease with esophagitis, without bleeding: Secondary | ICD-10-CM | POA: Diagnosis not present

## 2020-02-05 DIAGNOSIS — J441 Chronic obstructive pulmonary disease with (acute) exacerbation: Secondary | ICD-10-CM | POA: Diagnosis not present

## 2020-02-05 DIAGNOSIS — Z7902 Long term (current) use of antithrombotics/antiplatelets: Secondary | ICD-10-CM | POA: Diagnosis not present

## 2020-02-05 DIAGNOSIS — J9621 Acute and chronic respiratory failure with hypoxia: Secondary | ICD-10-CM | POA: Diagnosis not present

## 2020-02-05 DIAGNOSIS — E1151 Type 2 diabetes mellitus with diabetic peripheral angiopathy without gangrene: Secondary | ICD-10-CM | POA: Diagnosis not present

## 2020-02-05 DIAGNOSIS — L309 Dermatitis, unspecified: Secondary | ICD-10-CM | POA: Diagnosis not present

## 2020-02-05 DIAGNOSIS — G4733 Obstructive sleep apnea (adult) (pediatric): Secondary | ICD-10-CM | POA: Diagnosis not present

## 2020-02-05 DIAGNOSIS — E785 Hyperlipidemia, unspecified: Secondary | ICD-10-CM | POA: Diagnosis not present

## 2020-02-05 DIAGNOSIS — Z7951 Long term (current) use of inhaled steroids: Secondary | ICD-10-CM | POA: Diagnosis not present

## 2020-02-05 DIAGNOSIS — Z7982 Long term (current) use of aspirin: Secondary | ICD-10-CM | POA: Diagnosis not present

## 2020-02-05 DIAGNOSIS — G4730 Sleep apnea, unspecified: Secondary | ICD-10-CM | POA: Diagnosis not present

## 2020-02-05 DIAGNOSIS — I10 Essential (primary) hypertension: Secondary | ICD-10-CM | POA: Diagnosis not present

## 2020-02-05 NOTE — Telephone Encounter (Signed)
Called and spoke with Anissa with [facility] who stated pt is currently on abx and prednisone as well as mucinex. She stated he is satting on 94% on 4L O2. Pt is coughing up green tinged mucus. Pt is still having SOB. Anissa stated that she did not hear any wheezing.   Anissa stated that pt did sound some better today than he did 1 week ago when she last saw him.   Pt does have one more day left of prednisone and abx. Anissa stated she wanted to make Dr. Melvyn Novas aware of info from the visit today with pt.  Routing to Dr. Melvyn Novas.

## 2020-02-05 NOTE — Telephone Encounter (Signed)
Nothing else needed

## 2020-02-06 ENCOUNTER — Telehealth: Payer: Self-pay | Admitting: Internal Medicine

## 2020-02-06 DIAGNOSIS — E785 Hyperlipidemia, unspecified: Secondary | ICD-10-CM | POA: Diagnosis not present

## 2020-02-06 DIAGNOSIS — F17290 Nicotine dependence, other tobacco product, uncomplicated: Secondary | ICD-10-CM | POA: Diagnosis not present

## 2020-02-06 DIAGNOSIS — I10 Essential (primary) hypertension: Secondary | ICD-10-CM | POA: Diagnosis not present

## 2020-02-06 DIAGNOSIS — E1151 Type 2 diabetes mellitus with diabetic peripheral angiopathy without gangrene: Secondary | ICD-10-CM | POA: Diagnosis not present

## 2020-02-06 DIAGNOSIS — K2100 Gastro-esophageal reflux disease with esophagitis, without bleeding: Secondary | ICD-10-CM | POA: Diagnosis not present

## 2020-02-06 DIAGNOSIS — Z7902 Long term (current) use of antithrombotics/antiplatelets: Secondary | ICD-10-CM | POA: Diagnosis not present

## 2020-02-06 DIAGNOSIS — J441 Chronic obstructive pulmonary disease with (acute) exacerbation: Secondary | ICD-10-CM | POA: Diagnosis not present

## 2020-02-06 DIAGNOSIS — J9621 Acute and chronic respiratory failure with hypoxia: Secondary | ICD-10-CM | POA: Diagnosis not present

## 2020-02-06 DIAGNOSIS — Z7951 Long term (current) use of inhaled steroids: Secondary | ICD-10-CM | POA: Diagnosis not present

## 2020-02-06 DIAGNOSIS — Z7982 Long term (current) use of aspirin: Secondary | ICD-10-CM | POA: Diagnosis not present

## 2020-02-06 DIAGNOSIS — I2510 Atherosclerotic heart disease of native coronary artery without angina pectoris: Secondary | ICD-10-CM | POA: Diagnosis not present

## 2020-02-06 DIAGNOSIS — L309 Dermatitis, unspecified: Secondary | ICD-10-CM | POA: Diagnosis not present

## 2020-02-06 DIAGNOSIS — K2960 Other gastritis without bleeding: Secondary | ICD-10-CM | POA: Diagnosis not present

## 2020-02-06 DIAGNOSIS — Z9981 Dependence on supplemental oxygen: Secondary | ICD-10-CM | POA: Diagnosis not present

## 2020-02-06 NOTE — Telephone Encounter (Signed)
Primary Pulmonologist: Wert Last office visit and with whom: 01/11/20 Wyn Quaker NP What do we see them for (pulmonary problems): COPD with acute exacerbation Last OV assessment/plan:  Assessment and Plan:  Smoker Former cigarette smoker Current electronic cigarette user-e-cigarette cartridges 0 mg Former marijuana smoker recently quit in June/2021  Plan: Recommended the patient not smoke cigarettes, E cigarettes or marijuana  Chronic respiratory failure with hypoxia ([place]) Plan: Continue oxygen therapy as prescribed Maintain oxygen saturations above 88%  COPD with acute exacerbation (HCC) Plan: Doxycycline today Prednisone taper today We will coordinate close follow-up with Dr. Melvyn Novas virtually with the patient  COPD GOLD III with reversibility  Plan: Continue Symbicort 160 Continue Spiriva Respimat 2.5 Doxycycline today Prednisone today Close follow-up with Dr. Melvyn Novas   Coordination of complex care New PCP was added to patient's chart Patient is homebound Encouraged patient to have new PCP: Delia Chimes, MD to route notes to Dr. Melvyn Novas to keep our office informed   Follow Up Instructions:  Return in about 2 months (around 03/12/2020), or if symptoms worsen or fail to improve, for Follow up with Dr. Melvyn Novas.  I discussed the assessment and treatment plan with the patient. The patient was provided an opportunity to ask questions and all were answered. The patient agreed with the plan and demonstrated an understanding of the instructions.  The patient was advised to call back or seek an in-person evaluation if the symptoms worsen or if the condition fails to improve as anticipated.  I provided 28 minutes of non-face-to-face time during this encounter.   Lauraine Rinne, NP      Patient Instructions by Lauraine Rinne, NP at 01/11/2020 9:30 AM Author: Lauraine Rinne, NP Author Type: Nurse Practitioner Filed: 01/11/2020 9:45 AM  Note Status: Addendum Cosign:  Cosign Not Required Encounter Date: 01/11/2020  Editor: Lauraine Rinne, NP (Nurse Practitioner)      Prior Versions: 1. Lauraine Rinne, NP (Nurse Practitioner) at 01/11/2020 9:45 AM - Addendum   2. Lauraine Rinne, NP (Nurse Practitioner) at 01/11/2020 9:42 AM - Signed    You were seen today by Lauraine Rinne, NP  for:   1. COPD with acute exacerbation ([place]) 2. COPD mixed type ([place])  - doxycycline (VIBRA-TABS) 100 MG tablet; Take 1 tablet (100 mg total) by mouth 2 (two) times daily.  Dispense: 14 tablet; Refill: 0 - predniSONE (DELTASONE) 10 MG tablet; 4 tabs for 2 days, then 3 tabs for 2 days, 2 tabs for 2 days, then 1 tab for 2 days, then stop  Dispense: 20 tablet; Refill: 0  Doxycycline >>> 1 100 mg tablet every 12 hours for 7 days >>>take with food  >>>wear sunscreen   Prednisone 10mg  tablet  >>>4 tabs for 2 days, then 3 tabs for 2 days, 2 tabs for 2 days, then 1 tab for 2 days, then stop >>>take with food  >>>take in the morning   Continue Symbicort >>> 2 puffs in the morning right when you wake up, rinse out your mouth after use, 12 hours later 2 puffs, rinse after use >>> Take this daily, no matter what >>> This is not a rescue inhaler   Spiriva Respimat 2.5 >>> 2 puffs daily >>> Do this every day >>>This is not a rescue inhaler   Note your daily symptoms >remember "red flags" for COPD:  >>>Increase in cough >>>increase in sputum production >>>increase in shortness of breath or activity  intolerance.   If you notice these symptoms, please  call the office to be seen.    3. Chronic respiratory failure with hypoxia (HCC)  Continue oxygen therapy as prescribed  >>>maintain oxygen saturations greater than 88 percent  >>>if unable to maintain oxygen saturations please contact the office  >>>do not smoke with oxygen  >>>can use nasal saline gel or nasal saline rinses to moisturize nose if oxygen causes dryness   4. Smoker  We recommend that you stop  using your electronic cigarettes  5. Coordination of complex care  Please have your new primary care doctor route her notes to our office so that way Dr. Melvyn Novas in our medical team here can be informed on your health Our fax number here is: [phone]   We recommend today:       Meds ordered this encounter  Medications  . doxycycline (VIBRA-TABS) 100 MG tablet    Sig: Take 1 tablet (100 mg total) by mouth 2 (two) times daily.    Dispense:  14 tablet    Refill:  0  . predniSONE (DELTASONE) 10 MG tablet    Sig: 4 tabs for 2 days, then 3 tabs for 2 days, 2 tabs for 2 days, then 1 tab for 2 days, then stop    Dispense:  20 tablet    Refill:  0    Follow Up:    Return in about 2 months (around 03/12/2020), or if symptoms worsen or fail to improve, for Follow up with Dr. Melvyn Novas.         Was appointment offered to patient (explain)?  No, sent to Dr. Melvyn Novas   Reason for call: Completed antibiotic and prednisone today.  Continues to have chest congestion with sob with exertion, unable to do physical therapy d/t sats dropping.  He is on 4.5-5L, sats 89%, dropped to 84% briefly and improved after about 2 minutes of rest.  He is coughing up light yellow/green phlem.  He denies any fever, chills, body aches.  He has been covid vaccinated and has had no sick contacts.  Dr. Melvyn Novas, please advise.  (examples of things to ask: : When did symptoms start? Fever? Cough? Productive? Color to sputum? More sputum than usual? Wheezing? Have you needed increased oxygen? Are you taking your respiratory medications? What over the counter measures have you tried?)  No Known Allergies  Immunization History  Administered Date(s) Administered  . Influenza Split 02/14/2013, 03/06/2019  . Influenza, High Dose Seasonal PF 02/10/2016, 03/16/2018  . Influenza,inj,Quad PF,6+ Mos 01/28/2014  . Influenza-Unspecified 01/16/2015, 03/17/2018  . Janssen (J&J) SARS-COV-2 Vaccination 10/10/2019  .  Pneumococcal Conjugate-13 02/10/2016, 03/01/2017  . Pneumococcal-Unspecified 05/17/2013  . Zoster Recombinat (Shingrix) 09/16/2016, 02/19/2017

## 2020-02-06 NOTE — Telephone Encounter (Signed)
Will need ov with all active meds in hand / nothing else to offer over the phone based on his report that nothing so far has helped  - ok to see NP

## 2020-02-07 ENCOUNTER — Telehealth: Payer: Self-pay

## 2020-02-07 DIAGNOSIS — K2100 Gastro-esophageal reflux disease with esophagitis, without bleeding: Secondary | ICD-10-CM | POA: Diagnosis not present

## 2020-02-07 DIAGNOSIS — Z7951 Long term (current) use of inhaled steroids: Secondary | ICD-10-CM | POA: Diagnosis not present

## 2020-02-07 DIAGNOSIS — K2960 Other gastritis without bleeding: Secondary | ICD-10-CM | POA: Diagnosis not present

## 2020-02-07 DIAGNOSIS — F17290 Nicotine dependence, other tobacco product, uncomplicated: Secondary | ICD-10-CM | POA: Diagnosis not present

## 2020-02-07 DIAGNOSIS — L309 Dermatitis, unspecified: Secondary | ICD-10-CM | POA: Diagnosis not present

## 2020-02-07 DIAGNOSIS — J441 Chronic obstructive pulmonary disease with (acute) exacerbation: Secondary | ICD-10-CM | POA: Diagnosis not present

## 2020-02-07 DIAGNOSIS — I2510 Atherosclerotic heart disease of native coronary artery without angina pectoris: Secondary | ICD-10-CM | POA: Diagnosis not present

## 2020-02-07 DIAGNOSIS — I10 Essential (primary) hypertension: Secondary | ICD-10-CM | POA: Diagnosis not present

## 2020-02-07 DIAGNOSIS — J9621 Acute and chronic respiratory failure with hypoxia: Secondary | ICD-10-CM | POA: Diagnosis not present

## 2020-02-07 DIAGNOSIS — Z7902 Long term (current) use of antithrombotics/antiplatelets: Secondary | ICD-10-CM | POA: Diagnosis not present

## 2020-02-07 DIAGNOSIS — E1151 Type 2 diabetes mellitus with diabetic peripheral angiopathy without gangrene: Secondary | ICD-10-CM | POA: Diagnosis not present

## 2020-02-07 DIAGNOSIS — E785 Hyperlipidemia, unspecified: Secondary | ICD-10-CM | POA: Diagnosis not present

## 2020-02-07 DIAGNOSIS — Z7982 Long term (current) use of aspirin: Secondary | ICD-10-CM | POA: Diagnosis not present

## 2020-02-07 DIAGNOSIS — Z9981 Dependence on supplemental oxygen: Secondary | ICD-10-CM | POA: Diagnosis not present

## 2020-02-07 NOTE — Telephone Encounter (Signed)
Spoke with patient regarding prior message.Advised patient's PCP to contact our office to be able to reach out to Dr.Wert regarding any labs etc that need to be done . Patient's PCP is Dr.Stallings. Patient's voice was understanding. Nothing else further needed.   FYI Dr.Wert

## 2020-02-07 NOTE — Telephone Encounter (Signed)
Spoke with patient regarding prior message. Advised patient per Dr.Wert patient would need a office visit. Patient stated he is not mobile and has not left the house since rehab patient stated his pcp come's to the house. Advised patient I will send Dr.Wert a message regarding this. Patent's voice was understanding.   Dr.Wert can you please advise.  Thank you

## 2020-02-07 NOTE — Telephone Encounter (Signed)
She should communicate to PCP who can call me if there is anything she thinks I can do to help  ? Does not need to consider hospice at this point ?  >> copy to PCP

## 2020-02-07 NOTE — Telephone Encounter (Signed)
I called and spoke with the patient and provided information per Dr. Melvyn Novas.  Patient clarified that he does not have a pcp that makes house calls, he has a home health nurse that comes out to see him and she calls his doctor's office and they prescribe medication based on her assessment of him in his home.  For him to leave his home to go to the office he would have to take an ambulance and he is trying to pay off 3 ambulance bills right now.  I advised him to have the home health nurse contact his pcp as she usually does as Dr. Melvyn Novas is not able to provide care over the telephone.  He verbalized understanding.    Dr. Melvyn Novas,  Do you want the home health nurse to call us with her assessment of the patient or just let the pcp handle as they have been doing?  Please advise.  Thank you.

## 2020-02-07 NOTE — Telephone Encounter (Signed)
lmtcb for pt.  

## 2020-02-07 NOTE — Telephone Encounter (Signed)
We don't do house calls but PCP is free to see for resp problems which I can't treat in his case over the phone as we've done the best we can offer  Needs ov with all active meds in hand /no further phone care  > to er if condition worsens

## 2020-02-11 DIAGNOSIS — E785 Hyperlipidemia, unspecified: Secondary | ICD-10-CM | POA: Diagnosis not present

## 2020-02-11 DIAGNOSIS — Z9981 Dependence on supplemental oxygen: Secondary | ICD-10-CM | POA: Diagnosis not present

## 2020-02-11 DIAGNOSIS — Z7951 Long term (current) use of inhaled steroids: Secondary | ICD-10-CM | POA: Diagnosis not present

## 2020-02-11 DIAGNOSIS — K2100 Gastro-esophageal reflux disease with esophagitis, without bleeding: Secondary | ICD-10-CM | POA: Diagnosis not present

## 2020-02-11 DIAGNOSIS — E1151 Type 2 diabetes mellitus with diabetic peripheral angiopathy without gangrene: Secondary | ICD-10-CM | POA: Diagnosis not present

## 2020-02-11 DIAGNOSIS — Z7982 Long term (current) use of aspirin: Secondary | ICD-10-CM | POA: Diagnosis not present

## 2020-02-11 DIAGNOSIS — J9621 Acute and chronic respiratory failure with hypoxia: Secondary | ICD-10-CM | POA: Diagnosis not present

## 2020-02-11 DIAGNOSIS — J441 Chronic obstructive pulmonary disease with (acute) exacerbation: Secondary | ICD-10-CM | POA: Diagnosis not present

## 2020-02-11 DIAGNOSIS — K2960 Other gastritis without bleeding: Secondary | ICD-10-CM | POA: Diagnosis not present

## 2020-02-11 DIAGNOSIS — F17290 Nicotine dependence, other tobacco product, uncomplicated: Secondary | ICD-10-CM | POA: Diagnosis not present

## 2020-02-11 DIAGNOSIS — I2510 Atherosclerotic heart disease of native coronary artery without angina pectoris: Secondary | ICD-10-CM | POA: Diagnosis not present

## 2020-02-11 DIAGNOSIS — I10 Essential (primary) hypertension: Secondary | ICD-10-CM | POA: Diagnosis not present

## 2020-02-11 DIAGNOSIS — Z7902 Long term (current) use of antithrombotics/antiplatelets: Secondary | ICD-10-CM | POA: Diagnosis not present

## 2020-02-11 DIAGNOSIS — L309 Dermatitis, unspecified: Secondary | ICD-10-CM | POA: Diagnosis not present

## 2020-02-12 DIAGNOSIS — I2510 Atherosclerotic heart disease of native coronary artery without angina pectoris: Secondary | ICD-10-CM | POA: Diagnosis not present

## 2020-02-12 DIAGNOSIS — J9621 Acute and chronic respiratory failure with hypoxia: Secondary | ICD-10-CM | POA: Diagnosis not present

## 2020-02-12 DIAGNOSIS — L309 Dermatitis, unspecified: Secondary | ICD-10-CM | POA: Diagnosis not present

## 2020-02-12 DIAGNOSIS — Z7982 Long term (current) use of aspirin: Secondary | ICD-10-CM | POA: Diagnosis not present

## 2020-02-12 DIAGNOSIS — F17290 Nicotine dependence, other tobacco product, uncomplicated: Secondary | ICD-10-CM | POA: Diagnosis not present

## 2020-02-12 DIAGNOSIS — E1151 Type 2 diabetes mellitus with diabetic peripheral angiopathy without gangrene: Secondary | ICD-10-CM | POA: Diagnosis not present

## 2020-02-12 DIAGNOSIS — K2100 Gastro-esophageal reflux disease with esophagitis, without bleeding: Secondary | ICD-10-CM | POA: Diagnosis not present

## 2020-02-12 DIAGNOSIS — J441 Chronic obstructive pulmonary disease with (acute) exacerbation: Secondary | ICD-10-CM | POA: Diagnosis not present

## 2020-02-12 DIAGNOSIS — Z9981 Dependence on supplemental oxygen: Secondary | ICD-10-CM | POA: Diagnosis not present

## 2020-02-12 DIAGNOSIS — Z7951 Long term (current) use of inhaled steroids: Secondary | ICD-10-CM | POA: Diagnosis not present

## 2020-02-12 DIAGNOSIS — I10 Essential (primary) hypertension: Secondary | ICD-10-CM | POA: Diagnosis not present

## 2020-02-12 DIAGNOSIS — Z7902 Long term (current) use of antithrombotics/antiplatelets: Secondary | ICD-10-CM | POA: Diagnosis not present

## 2020-02-12 DIAGNOSIS — E785 Hyperlipidemia, unspecified: Secondary | ICD-10-CM | POA: Diagnosis not present

## 2020-02-12 DIAGNOSIS — K2960 Other gastritis without bleeding: Secondary | ICD-10-CM | POA: Diagnosis not present

## 2020-02-14 DIAGNOSIS — K2960 Other gastritis without bleeding: Secondary | ICD-10-CM | POA: Diagnosis not present

## 2020-02-14 DIAGNOSIS — Z7902 Long term (current) use of antithrombotics/antiplatelets: Secondary | ICD-10-CM | POA: Diagnosis not present

## 2020-02-14 DIAGNOSIS — F17290 Nicotine dependence, other tobacco product, uncomplicated: Secondary | ICD-10-CM | POA: Diagnosis not present

## 2020-02-14 DIAGNOSIS — Z7982 Long term (current) use of aspirin: Secondary | ICD-10-CM | POA: Diagnosis not present

## 2020-02-14 DIAGNOSIS — Z7951 Long term (current) use of inhaled steroids: Secondary | ICD-10-CM | POA: Diagnosis not present

## 2020-02-14 DIAGNOSIS — E785 Hyperlipidemia, unspecified: Secondary | ICD-10-CM | POA: Diagnosis not present

## 2020-02-14 DIAGNOSIS — K2100 Gastro-esophageal reflux disease with esophagitis, without bleeding: Secondary | ICD-10-CM | POA: Diagnosis not present

## 2020-02-14 DIAGNOSIS — L309 Dermatitis, unspecified: Secondary | ICD-10-CM | POA: Diagnosis not present

## 2020-02-14 DIAGNOSIS — I2510 Atherosclerotic heart disease of native coronary artery without angina pectoris: Secondary | ICD-10-CM | POA: Diagnosis not present

## 2020-02-14 DIAGNOSIS — I10 Essential (primary) hypertension: Secondary | ICD-10-CM | POA: Diagnosis not present

## 2020-02-14 DIAGNOSIS — J441 Chronic obstructive pulmonary disease with (acute) exacerbation: Secondary | ICD-10-CM | POA: Diagnosis not present

## 2020-02-14 DIAGNOSIS — Z9981 Dependence on supplemental oxygen: Secondary | ICD-10-CM | POA: Diagnosis not present

## 2020-02-14 DIAGNOSIS — E1151 Type 2 diabetes mellitus with diabetic peripheral angiopathy without gangrene: Secondary | ICD-10-CM | POA: Diagnosis not present

## 2020-02-14 DIAGNOSIS — J9621 Acute and chronic respiratory failure with hypoxia: Secondary | ICD-10-CM | POA: Diagnosis not present

## 2020-02-15 DIAGNOSIS — K2960 Other gastritis without bleeding: Secondary | ICD-10-CM | POA: Diagnosis not present

## 2020-02-15 DIAGNOSIS — I10 Essential (primary) hypertension: Secondary | ICD-10-CM | POA: Diagnosis not present

## 2020-02-15 DIAGNOSIS — J441 Chronic obstructive pulmonary disease with (acute) exacerbation: Secondary | ICD-10-CM | POA: Diagnosis not present

## 2020-02-15 DIAGNOSIS — Z7982 Long term (current) use of aspirin: Secondary | ICD-10-CM | POA: Diagnosis not present

## 2020-02-15 DIAGNOSIS — Z7951 Long term (current) use of inhaled steroids: Secondary | ICD-10-CM | POA: Diagnosis not present

## 2020-02-15 DIAGNOSIS — L309 Dermatitis, unspecified: Secondary | ICD-10-CM | POA: Diagnosis not present

## 2020-02-15 DIAGNOSIS — I2510 Atherosclerotic heart disease of native coronary artery without angina pectoris: Secondary | ICD-10-CM | POA: Diagnosis not present

## 2020-02-15 DIAGNOSIS — K2100 Gastro-esophageal reflux disease with esophagitis, without bleeding: Secondary | ICD-10-CM | POA: Diagnosis not present

## 2020-02-15 DIAGNOSIS — E785 Hyperlipidemia, unspecified: Secondary | ICD-10-CM | POA: Diagnosis not present

## 2020-02-15 DIAGNOSIS — E1151 Type 2 diabetes mellitus with diabetic peripheral angiopathy without gangrene: Secondary | ICD-10-CM | POA: Diagnosis not present

## 2020-02-15 DIAGNOSIS — F17290 Nicotine dependence, other tobacco product, uncomplicated: Secondary | ICD-10-CM | POA: Diagnosis not present

## 2020-02-15 DIAGNOSIS — J9621 Acute and chronic respiratory failure with hypoxia: Secondary | ICD-10-CM | POA: Diagnosis not present

## 2020-02-15 DIAGNOSIS — Z9981 Dependence on supplemental oxygen: Secondary | ICD-10-CM | POA: Diagnosis not present

## 2020-02-15 DIAGNOSIS — Z7902 Long term (current) use of antithrombotics/antiplatelets: Secondary | ICD-10-CM | POA: Diagnosis not present

## 2020-02-19 DIAGNOSIS — L309 Dermatitis, unspecified: Secondary | ICD-10-CM | POA: Diagnosis not present

## 2020-02-19 DIAGNOSIS — E1151 Type 2 diabetes mellitus with diabetic peripheral angiopathy without gangrene: Secondary | ICD-10-CM | POA: Diagnosis not present

## 2020-02-19 DIAGNOSIS — Z7951 Long term (current) use of inhaled steroids: Secondary | ICD-10-CM | POA: Diagnosis not present

## 2020-02-19 DIAGNOSIS — K2100 Gastro-esophageal reflux disease with esophagitis, without bleeding: Secondary | ICD-10-CM | POA: Diagnosis not present

## 2020-02-19 DIAGNOSIS — I2510 Atherosclerotic heart disease of native coronary artery without angina pectoris: Secondary | ICD-10-CM | POA: Diagnosis not present

## 2020-02-19 DIAGNOSIS — E785 Hyperlipidemia, unspecified: Secondary | ICD-10-CM | POA: Diagnosis not present

## 2020-02-19 DIAGNOSIS — F17290 Nicotine dependence, other tobacco product, uncomplicated: Secondary | ICD-10-CM | POA: Diagnosis not present

## 2020-02-19 DIAGNOSIS — K2960 Other gastritis without bleeding: Secondary | ICD-10-CM | POA: Diagnosis not present

## 2020-02-19 DIAGNOSIS — J441 Chronic obstructive pulmonary disease with (acute) exacerbation: Secondary | ICD-10-CM | POA: Diagnosis not present

## 2020-02-19 DIAGNOSIS — I10 Essential (primary) hypertension: Secondary | ICD-10-CM | POA: Diagnosis not present

## 2020-02-19 DIAGNOSIS — Z7982 Long term (current) use of aspirin: Secondary | ICD-10-CM | POA: Diagnosis not present

## 2020-02-19 DIAGNOSIS — J9621 Acute and chronic respiratory failure with hypoxia: Secondary | ICD-10-CM | POA: Diagnosis not present

## 2020-02-19 DIAGNOSIS — Z7902 Long term (current) use of antithrombotics/antiplatelets: Secondary | ICD-10-CM | POA: Diagnosis not present

## 2020-02-19 DIAGNOSIS — Z9981 Dependence on supplemental oxygen: Secondary | ICD-10-CM | POA: Diagnosis not present

## 2020-02-21 ENCOUNTER — Other Ambulatory Visit (HOSPITAL_COMMUNITY): Payer: Self-pay | Admitting: Psychiatry

## 2020-02-21 DIAGNOSIS — F330 Major depressive disorder, recurrent, mild: Secondary | ICD-10-CM

## 2020-02-21 DIAGNOSIS — F419 Anxiety disorder, unspecified: Secondary | ICD-10-CM

## 2020-02-22 DIAGNOSIS — J441 Chronic obstructive pulmonary disease with (acute) exacerbation: Secondary | ICD-10-CM | POA: Diagnosis not present

## 2020-02-22 DIAGNOSIS — Z7982 Long term (current) use of aspirin: Secondary | ICD-10-CM | POA: Diagnosis not present

## 2020-02-22 DIAGNOSIS — J9621 Acute and chronic respiratory failure with hypoxia: Secondary | ICD-10-CM | POA: Diagnosis not present

## 2020-02-22 DIAGNOSIS — E1151 Type 2 diabetes mellitus with diabetic peripheral angiopathy without gangrene: Secondary | ICD-10-CM | POA: Diagnosis not present

## 2020-02-22 DIAGNOSIS — E785 Hyperlipidemia, unspecified: Secondary | ICD-10-CM | POA: Diagnosis not present

## 2020-02-22 DIAGNOSIS — I10 Essential (primary) hypertension: Secondary | ICD-10-CM | POA: Diagnosis not present

## 2020-02-22 DIAGNOSIS — Z9981 Dependence on supplemental oxygen: Secondary | ICD-10-CM | POA: Diagnosis not present

## 2020-02-22 DIAGNOSIS — I2510 Atherosclerotic heart disease of native coronary artery without angina pectoris: Secondary | ICD-10-CM | POA: Diagnosis not present

## 2020-02-22 DIAGNOSIS — L309 Dermatitis, unspecified: Secondary | ICD-10-CM | POA: Diagnosis not present

## 2020-02-22 DIAGNOSIS — K2100 Gastro-esophageal reflux disease with esophagitis, without bleeding: Secondary | ICD-10-CM | POA: Diagnosis not present

## 2020-02-22 DIAGNOSIS — K2960 Other gastritis without bleeding: Secondary | ICD-10-CM | POA: Diagnosis not present

## 2020-02-22 DIAGNOSIS — Z7902 Long term (current) use of antithrombotics/antiplatelets: Secondary | ICD-10-CM | POA: Diagnosis not present

## 2020-02-22 DIAGNOSIS — Z7951 Long term (current) use of inhaled steroids: Secondary | ICD-10-CM | POA: Diagnosis not present

## 2020-02-22 DIAGNOSIS — F17290 Nicotine dependence, other tobacco product, uncomplicated: Secondary | ICD-10-CM | POA: Diagnosis not present

## 2020-02-23 DIAGNOSIS — J449 Chronic obstructive pulmonary disease, unspecified: Secondary | ICD-10-CM | POA: Diagnosis not present

## 2020-02-25 DIAGNOSIS — Z9981 Dependence on supplemental oxygen: Secondary | ICD-10-CM | POA: Diagnosis not present

## 2020-02-25 DIAGNOSIS — K2100 Gastro-esophageal reflux disease with esophagitis, without bleeding: Secondary | ICD-10-CM | POA: Diagnosis not present

## 2020-02-25 DIAGNOSIS — Z7902 Long term (current) use of antithrombotics/antiplatelets: Secondary | ICD-10-CM | POA: Diagnosis not present

## 2020-02-25 DIAGNOSIS — F17290 Nicotine dependence, other tobacco product, uncomplicated: Secondary | ICD-10-CM | POA: Diagnosis not present

## 2020-02-25 DIAGNOSIS — K2960 Other gastritis without bleeding: Secondary | ICD-10-CM | POA: Diagnosis not present

## 2020-02-25 DIAGNOSIS — Z7982 Long term (current) use of aspirin: Secondary | ICD-10-CM | POA: Diagnosis not present

## 2020-02-25 DIAGNOSIS — I10 Essential (primary) hypertension: Secondary | ICD-10-CM | POA: Diagnosis not present

## 2020-02-25 DIAGNOSIS — J9621 Acute and chronic respiratory failure with hypoxia: Secondary | ICD-10-CM | POA: Diagnosis not present

## 2020-02-25 DIAGNOSIS — I2510 Atherosclerotic heart disease of native coronary artery without angina pectoris: Secondary | ICD-10-CM | POA: Diagnosis not present

## 2020-02-25 DIAGNOSIS — Z7951 Long term (current) use of inhaled steroids: Secondary | ICD-10-CM | POA: Diagnosis not present

## 2020-02-25 DIAGNOSIS — L309 Dermatitis, unspecified: Secondary | ICD-10-CM | POA: Diagnosis not present

## 2020-02-25 DIAGNOSIS — E1151 Type 2 diabetes mellitus with diabetic peripheral angiopathy without gangrene: Secondary | ICD-10-CM | POA: Diagnosis not present

## 2020-02-25 DIAGNOSIS — E785 Hyperlipidemia, unspecified: Secondary | ICD-10-CM | POA: Diagnosis not present

## 2020-02-25 DIAGNOSIS — J441 Chronic obstructive pulmonary disease with (acute) exacerbation: Secondary | ICD-10-CM | POA: Diagnosis not present

## 2020-02-26 DIAGNOSIS — Z7982 Long term (current) use of aspirin: Secondary | ICD-10-CM | POA: Diagnosis not present

## 2020-02-26 DIAGNOSIS — Z7951 Long term (current) use of inhaled steroids: Secondary | ICD-10-CM | POA: Diagnosis not present

## 2020-02-26 DIAGNOSIS — E1151 Type 2 diabetes mellitus with diabetic peripheral angiopathy without gangrene: Secondary | ICD-10-CM | POA: Diagnosis not present

## 2020-02-26 DIAGNOSIS — J441 Chronic obstructive pulmonary disease with (acute) exacerbation: Secondary | ICD-10-CM | POA: Diagnosis not present

## 2020-02-26 DIAGNOSIS — K2960 Other gastritis without bleeding: Secondary | ICD-10-CM | POA: Diagnosis not present

## 2020-02-26 DIAGNOSIS — L309 Dermatitis, unspecified: Secondary | ICD-10-CM | POA: Diagnosis not present

## 2020-02-26 DIAGNOSIS — Z7902 Long term (current) use of antithrombotics/antiplatelets: Secondary | ICD-10-CM | POA: Diagnosis not present

## 2020-02-26 DIAGNOSIS — F17290 Nicotine dependence, other tobacco product, uncomplicated: Secondary | ICD-10-CM | POA: Diagnosis not present

## 2020-02-26 DIAGNOSIS — J9621 Acute and chronic respiratory failure with hypoxia: Secondary | ICD-10-CM | POA: Diagnosis not present

## 2020-02-26 DIAGNOSIS — Z9981 Dependence on supplemental oxygen: Secondary | ICD-10-CM | POA: Diagnosis not present

## 2020-02-26 DIAGNOSIS — E785 Hyperlipidemia, unspecified: Secondary | ICD-10-CM | POA: Diagnosis not present

## 2020-02-26 DIAGNOSIS — I10 Essential (primary) hypertension: Secondary | ICD-10-CM | POA: Diagnosis not present

## 2020-02-26 DIAGNOSIS — K2100 Gastro-esophageal reflux disease with esophagitis, without bleeding: Secondary | ICD-10-CM | POA: Diagnosis not present

## 2020-02-26 DIAGNOSIS — I2510 Atherosclerotic heart disease of native coronary artery without angina pectoris: Secondary | ICD-10-CM | POA: Diagnosis not present

## 2020-03-04 DIAGNOSIS — L309 Dermatitis, unspecified: Secondary | ICD-10-CM | POA: Diagnosis not present

## 2020-03-04 DIAGNOSIS — K2960 Other gastritis without bleeding: Secondary | ICD-10-CM | POA: Diagnosis not present

## 2020-03-04 DIAGNOSIS — F17290 Nicotine dependence, other tobacco product, uncomplicated: Secondary | ICD-10-CM | POA: Diagnosis not present

## 2020-03-04 DIAGNOSIS — Z7982 Long term (current) use of aspirin: Secondary | ICD-10-CM | POA: Diagnosis not present

## 2020-03-04 DIAGNOSIS — J441 Chronic obstructive pulmonary disease with (acute) exacerbation: Secondary | ICD-10-CM | POA: Diagnosis not present

## 2020-03-04 DIAGNOSIS — E785 Hyperlipidemia, unspecified: Secondary | ICD-10-CM | POA: Diagnosis not present

## 2020-03-04 DIAGNOSIS — S2231XD Fracture of one rib, right side, subsequent encounter for fracture with routine healing: Secondary | ICD-10-CM | POA: Diagnosis not present

## 2020-03-04 DIAGNOSIS — J9621 Acute and chronic respiratory failure with hypoxia: Secondary | ICD-10-CM | POA: Diagnosis not present

## 2020-03-04 DIAGNOSIS — Z7902 Long term (current) use of antithrombotics/antiplatelets: Secondary | ICD-10-CM | POA: Diagnosis not present

## 2020-03-04 DIAGNOSIS — Z9981 Dependence on supplemental oxygen: Secondary | ICD-10-CM | POA: Diagnosis not present

## 2020-03-04 DIAGNOSIS — K2100 Gastro-esophageal reflux disease with esophagitis, without bleeding: Secondary | ICD-10-CM | POA: Diagnosis not present

## 2020-03-04 DIAGNOSIS — I2510 Atherosclerotic heart disease of native coronary artery without angina pectoris: Secondary | ICD-10-CM | POA: Diagnosis not present

## 2020-03-04 DIAGNOSIS — E1151 Type 2 diabetes mellitus with diabetic peripheral angiopathy without gangrene: Secondary | ICD-10-CM | POA: Diagnosis not present

## 2020-03-04 DIAGNOSIS — I5032 Chronic diastolic (congestive) heart failure: Secondary | ICD-10-CM | POA: Diagnosis not present

## 2020-03-04 DIAGNOSIS — I10 Essential (primary) hypertension: Secondary | ICD-10-CM | POA: Diagnosis not present

## 2020-03-04 DIAGNOSIS — Z7951 Long term (current) use of inhaled steroids: Secondary | ICD-10-CM | POA: Diagnosis not present

## 2020-03-05 ENCOUNTER — Ambulatory Visit: Payer: Medicare Other | Admitting: Dermatology

## 2020-03-05 DIAGNOSIS — E1151 Type 2 diabetes mellitus with diabetic peripheral angiopathy without gangrene: Secondary | ICD-10-CM | POA: Diagnosis not present

## 2020-03-05 DIAGNOSIS — Z7951 Long term (current) use of inhaled steroids: Secondary | ICD-10-CM | POA: Diagnosis not present

## 2020-03-05 DIAGNOSIS — J441 Chronic obstructive pulmonary disease with (acute) exacerbation: Secondary | ICD-10-CM | POA: Diagnosis not present

## 2020-03-05 DIAGNOSIS — I10 Essential (primary) hypertension: Secondary | ICD-10-CM | POA: Diagnosis not present

## 2020-03-05 DIAGNOSIS — Z9981 Dependence on supplemental oxygen: Secondary | ICD-10-CM | POA: Diagnosis not present

## 2020-03-05 DIAGNOSIS — K2100 Gastro-esophageal reflux disease with esophagitis, without bleeding: Secondary | ICD-10-CM | POA: Diagnosis not present

## 2020-03-05 DIAGNOSIS — F17290 Nicotine dependence, other tobacco product, uncomplicated: Secondary | ICD-10-CM | POA: Diagnosis not present

## 2020-03-05 DIAGNOSIS — Z7982 Long term (current) use of aspirin: Secondary | ICD-10-CM | POA: Diagnosis not present

## 2020-03-05 DIAGNOSIS — Z7902 Long term (current) use of antithrombotics/antiplatelets: Secondary | ICD-10-CM | POA: Diagnosis not present

## 2020-03-05 DIAGNOSIS — E785 Hyperlipidemia, unspecified: Secondary | ICD-10-CM | POA: Diagnosis not present

## 2020-03-05 DIAGNOSIS — K2960 Other gastritis without bleeding: Secondary | ICD-10-CM | POA: Diagnosis not present

## 2020-03-05 DIAGNOSIS — L309 Dermatitis, unspecified: Secondary | ICD-10-CM | POA: Diagnosis not present

## 2020-03-05 DIAGNOSIS — I2510 Atherosclerotic heart disease of native coronary artery without angina pectoris: Secondary | ICD-10-CM | POA: Diagnosis not present

## 2020-03-05 DIAGNOSIS — J9621 Acute and chronic respiratory failure with hypoxia: Secondary | ICD-10-CM | POA: Diagnosis not present

## 2020-03-11 ENCOUNTER — Other Ambulatory Visit: Payer: Self-pay

## 2020-03-11 ENCOUNTER — Encounter: Payer: Self-pay | Admitting: Internal Medicine

## 2020-03-11 ENCOUNTER — Ambulatory Visit (INDEPENDENT_AMBULATORY_CARE_PROVIDER_SITE_OTHER): Payer: Medicare Other | Admitting: Internal Medicine

## 2020-03-11 DIAGNOSIS — Z7902 Long term (current) use of antithrombotics/antiplatelets: Secondary | ICD-10-CM | POA: Diagnosis not present

## 2020-03-11 DIAGNOSIS — J441 Chronic obstructive pulmonary disease with (acute) exacerbation: Secondary | ICD-10-CM | POA: Diagnosis not present

## 2020-03-11 DIAGNOSIS — Z7982 Long term (current) use of aspirin: Secondary | ICD-10-CM | POA: Diagnosis not present

## 2020-03-11 DIAGNOSIS — J9611 Chronic respiratory failure with hypoxia: Secondary | ICD-10-CM

## 2020-03-11 DIAGNOSIS — Z9981 Dependence on supplemental oxygen: Secondary | ICD-10-CM | POA: Diagnosis not present

## 2020-03-11 DIAGNOSIS — Z7951 Long term (current) use of inhaled steroids: Secondary | ICD-10-CM | POA: Diagnosis not present

## 2020-03-11 DIAGNOSIS — I2510 Atherosclerotic heart disease of native coronary artery without angina pectoris: Secondary | ICD-10-CM | POA: Diagnosis not present

## 2020-03-11 DIAGNOSIS — K2960 Other gastritis without bleeding: Secondary | ICD-10-CM | POA: Diagnosis not present

## 2020-03-11 DIAGNOSIS — I10 Essential (primary) hypertension: Secondary | ICD-10-CM | POA: Diagnosis not present

## 2020-03-11 DIAGNOSIS — F17290 Nicotine dependence, other tobacco product, uncomplicated: Secondary | ICD-10-CM | POA: Diagnosis not present

## 2020-03-11 DIAGNOSIS — E1151 Type 2 diabetes mellitus with diabetic peripheral angiopathy without gangrene: Secondary | ICD-10-CM | POA: Diagnosis not present

## 2020-03-11 DIAGNOSIS — K2100 Gastro-esophageal reflux disease with esophagitis, without bleeding: Secondary | ICD-10-CM | POA: Diagnosis not present

## 2020-03-11 DIAGNOSIS — J449 Chronic obstructive pulmonary disease, unspecified: Secondary | ICD-10-CM | POA: Diagnosis not present

## 2020-03-11 DIAGNOSIS — J9621 Acute and chronic respiratory failure with hypoxia: Secondary | ICD-10-CM | POA: Diagnosis not present

## 2020-03-11 DIAGNOSIS — L309 Dermatitis, unspecified: Secondary | ICD-10-CM | POA: Diagnosis not present

## 2020-03-11 DIAGNOSIS — E785 Hyperlipidemia, unspecified: Secondary | ICD-10-CM | POA: Diagnosis not present

## 2020-03-11 MED ORDER — PREDNISONE 10 MG PO TABS: ORAL_TABLET | ORAL | 0 refills | Status: DC

## 2020-03-11 NOTE — Progress Notes (Signed)
Subjective:    Patient ID: [name], male    DOB: 18-Dec-1950  MRN: [id number]    Brief patient profile:  69  yobm quit smoking 12/03/14 on disability due to depression since 1994 baseline on spiriva with  doe room to room s 02 then underwent L THR 03/2013  at [facility] and noted even in hosp need 02 with any activity so referred to pulmonary clinic 05/01/2013 by [facility] with GOLD III copd with mild reversibility documented 06/12/13    History of Present Illness  05/01/2013 1st  Pulmonary office visit/ Rasheema Truluck cc doe x room to room if not on 02 using walker ever since mobilized from surgery 04/03/13 s cough, some chest tightness once or twice per week esp after cigarette use, has not tried inhalers at home (other than spiriva)  but nebs in hosp seemed to help rec Start symbicort 160 Take 2 puffs first thing in am and then another 2 puffs about 12 hours later.  Continue spiriva one in am Work on inhaler technique:    Please remember to go to the lab and x-ray department downstairs for your tests - we will call you with the results when they are available. The key is to stop smoking completely before smoking completely stops you!      02/10/2016  f/u ov/[name] re: COPD GOLD III/ dulera 200/ spiriva /no med calendar  Chief Complaint  Patient presents with  . Follow-up    PFT's done today. Breathing has improved since the last visit.   ex x 15-20 min s stopping at 2lpm / 2lpm hs  rec Plan A = Automatic =  Performist(formoterol) with budesonide(Pulmonary)  = symbicort/dulera   Twice daily through [facility] to use up your spiriva remaining if can't afford to refill it  Plan B = Backup Only use your albuterol (Proair) as rescue     12/29/2016  f/u ov/[name] re:  COPD GOLD III now on symb 160 2bid/ spiriva smi Chief Complaint  Patient presents with  . Follow-up    pt has good and bad days- sob with exertion, prod cough, chest tightness with exertion on bad days.  states today is a  good day.    ex for up to 15 min with therapist then rests but continues for up to an hour with sats upper 80s on 3lpm with ex/ 2lpm otherwise.  Never tries saba before ex but thinks it helps after he stops and rests  rec Goal with 02 is to keep the saturation over 90% so ok to increase  with exertion to maintain this sat Ok to try the proair x 2 pffs  15 min prior to exercise to see if there is any benefit  Work on inhaler technique:   See calendar for specific medication instructions .      07/05/2017  f/u ov/[name] re: copd  III/ 02 dep  Chief Complaint  Patient presents with  . Follow-up    6 month follow up for COPD. States his breathing capacity has increased since last visit. Does not get SOB as quickly as before.   Dyspnea:  Across the room on 3lpm / also limited by L hip pain  Cough: no Sleep: cpap and 3lpm  rec Use med calendar   Virtual Visit via Telephone Note 11/13/2018   I connected with [name] on 11/13/18 at  230 PM EDT by telephone and verified that I am speaking with the correct person using two identifiers.  I discussed the limitations, risks, security and privacy concerns of performing an evaluation and management service by telephone and the availability of in person appointments. I also discussed with the patient that there may be a patient responsible charge related to this service. The patient expressed understanding and agreed to proceed.   History of Present Illness: re copd III/ 02 dep on dulera 200 2bid/ spiriva, rare saba Dyspnea:  Just around the house/ not going out at all  Cough: min am not purulent Sleeping: cpap/ 3lpm ok on side bed is flat SABA use: rare 02: 3lpm rec No change in medications but try to get more outdoor exercise when feasible to maintain your conditioning and prevent blood clots  Ok to adjust your 02 with exercise if needed to keep sats > 90% and call if can't maintain that minimum  Level   Virtual Visit via Telephone  Note 02/20/2019  History of Present Illness: Dyspnea:  Around the house room to room  Cough: minimal p rx amox rx per bland Sleeping: ok on cpap / 3lpm bleeding in / lie flat ok on 2 pillows  SABA use: maybe once a week at most   02: 3-4 lpm 24/7  rec No change rx but use saba approp as backup    01/11/20  aecopd rx by NP  Doxy     Virtual Visit via Telephone Note 03/11/2020   I connected with [name] on 03/11/20 at 9: 30 am  EDT by telephone and verified that I am speaking with the correct person using two identifiers. Pt is at home and this call made from my office with no other participants    I discussed the limitations, risks, security and privacy concerns of performing an evaluation and management service by telephone and the availability of in person appointments. I also discussed with the patient that there may be a patient responsible charge related to this service. The patient expressed understanding and agreed to proceed.   History of Present Illness: GOLD III/ 02 dep / quit smoking x 3 weeks maint on  symb 160/spiriva  Dyspnea:  Can't get out of bed  - much better while on prednisone but much worse off it  Cough: some/not nasty Sleeping: lies on side/flat bed 3 pillows  SABA use: once a day  02: 3 - 4.5 lpm    No obvious day to day or daytime variability or assoc excess/ purulent sputum or mucus plugs or hemoptysis or cp or chest tightness, subjective wheeze or overt sinus or hb symptoms.    Also denies any obvious fluctuation of symptoms with weather or environmental changes or other aggravating or alleviating factors except as outlined above.   Meds reviewed/ med reconciliation completed     No outpatient medications have been marked as taking for the 03/11/20 encounter (Appointment) with Tanda Rockers, MD.         Observations/Objective: Speaks in short phrases, good phonation/ no spont coughing    Assessment and Plan: See problem list for  active a/p's   Follow Up Instructions: See avs for instructions unique to this ov which includes revised/ updated med list     I discussed the assessment and treatment plan with the patient. The patient was provided an opportunity to ask questions and all were answered. The patient agreed with the plan and demonstrated an understanding of the instructions.   The patient was advised to call back or seek an in-person evaluation if the symptoms worsen or  if the condition fails to improve as anticipated.  I provided  25  minutes of non-face-to-face time during this encounter.   Christinia Gully, MD

## 2020-03-11 NOTE — Assessment & Plan Note (Signed)
Quit smoking 11/2014  - PFT's 06/12/13  FEV1  1.15 (37%) with ratrio 43 and 14% better p B2 p no rx day of study and DLCO 29% -09/14/2013 med calendar > did not bring to office as requested 10/30/13 or 01/28/14 or 06/27/14  - 01/28/2014 p extensive coaching HFA effectiveness =    75%, 100% with dpi  - PFT's  02/10/2016  FEV1 1.06  (35 % ) ratio 44  p -6 % improvement from saba p prior to study with DLCO  24 % corrects to 37  % for alv volume   - 07/05/2017  After extensive coaching HFA effectiveness =    90% from a baseline of 75% (short Ti)  - 03/11/2020  prednisoe  maint  2 until better the 1 daily and f/u in 6 m in office with all meds   Group D in terms of symptom/risk and laba/lama/ICS  therefore appropriate rx at this point >>>  Continue symb/spiriva and prn saba   I spent extra time with pt today reviewing appropriate use of albuterol for prn use on exertion with the following points: 1) saba is for relief of sob that does not improve by walking a slower pace or resting but rather if the pt does not improve after trying this first. 2) If the pt is convinced, as many are, that saba helps recover from activity faster then it's easy to tell if this is the case by re-challenging : ie stop, take the inhaler, then p 5 minutes try the exact same activity (intensity of workload) that just caused the symptoms and see if they are substantially diminished or not after saba 3) if there is an activity that reproducibly causes the symptoms, try the saba 15 min before the activity on alternate days   If in fact the saba really does help, then fine to continue to use it prn but advised may need to look closer at the maintenance regimen being used to achieve better control of airways disease with exertion.   Re prednisone:  The goal with a chronic steroid dependent illness is always arriving at the lowest effective dose that controls the disease/symptoms and not accepting a set "formula" which is based on statistics  or guidelines that don't always take into account patient  variability or the natural hx of the dz in every individual patient, which may well vary over time.  For now therefore I recommend the patient maintain  Ceiling of 20 mg and floor of 10 mg until seen

## 2020-03-11 NOTE — Assessment & Plan Note (Signed)
Placed on 02 at discharge p THR 03/2013 3h/day and cpap at hs RA sats at rest 94% 06/27/14  so rec use 02 with activity only  - 11/29/2014   Walked RA x one lap @ 185 stopped due to hip pain/ sob with sat 88% > referred for POC  - 05/28/2015 sats ok at rest RA/  Walked on 3lpm  x one lap @ 185 stopped due to sob/ chest tightness but sats ok   As of 03/11/2020 ok to use 3-4lpm with goal of keeping sats > 90% at peak levels of activity  Each maintenance medication was reviewed in detail including most importantly the difference between maintenance and as needed and under what circumstances the prns are to be used.  Please see AVS for specific  Instructions which are unique to this visit and I personally typed out  which were reviewed in detail over the phone with the patient and a copy provided via Mychart

## 2020-03-11 NOTE — Patient Instructions (Signed)
Predisone 10 mg x 2 each am until you better then 1 daily    Try albuterol 15 min before an activity that you know would make you short of breath and see if it makes any difference and if makes none then don't take it after activity unless you can't catch your breath.  Please schedule a follow up office visit in 6 weeks, call sooner if needed  - bring all your medication

## 2020-03-12 ENCOUNTER — Telehealth: Payer: Self-pay | Admitting: Internal Medicine

## 2020-03-12 MED ORDER — AZITHROMYCIN 250 MG PO TABS: ORAL_TABLET | ORAL | 0 refills | Status: AC

## 2020-03-12 NOTE — Telephone Encounter (Signed)
z-pak 

## 2020-03-12 NOTE — Telephone Encounter (Signed)
Called and spoke to patient.  Patient stated that he produced sputum this morning that was mixed with dark brown blood.  He described amount blood to be smaller then a dime.  He has only coughed up blood once this morning.  He is requesting abx.  Denied additional sx.   Dr. Melvyn Novas, please advise. Thanks

## 2020-03-12 NOTE — Telephone Encounter (Signed)
Spoke with pt. He is aware of MW's response. Rx has been sent in. Nothing further was needed.

## 2020-03-14 DIAGNOSIS — Z87891 Personal history of nicotine dependence: Secondary | ICD-10-CM | POA: Diagnosis not present

## 2020-03-14 DIAGNOSIS — G4733 Obstructive sleep apnea (adult) (pediatric): Secondary | ICD-10-CM | POA: Diagnosis not present

## 2020-03-14 DIAGNOSIS — J449 Chronic obstructive pulmonary disease, unspecified: Secondary | ICD-10-CM | POA: Diagnosis not present

## 2020-03-24 ENCOUNTER — Telehealth (HOSPITAL_COMMUNITY): Payer: Medicare Other | Admitting: Psychiatry

## 2020-03-24 DIAGNOSIS — Z7951 Long term (current) use of inhaled steroids: Secondary | ICD-10-CM | POA: Diagnosis not present

## 2020-03-24 DIAGNOSIS — E785 Hyperlipidemia, unspecified: Secondary | ICD-10-CM | POA: Diagnosis not present

## 2020-03-24 DIAGNOSIS — Z7982 Long term (current) use of aspirin: Secondary | ICD-10-CM | POA: Diagnosis not present

## 2020-03-24 DIAGNOSIS — L309 Dermatitis, unspecified: Secondary | ICD-10-CM | POA: Diagnosis not present

## 2020-03-24 DIAGNOSIS — E1151 Type 2 diabetes mellitus with diabetic peripheral angiopathy without gangrene: Secondary | ICD-10-CM | POA: Diagnosis not present

## 2020-03-24 DIAGNOSIS — J9621 Acute and chronic respiratory failure with hypoxia: Secondary | ICD-10-CM | POA: Diagnosis not present

## 2020-03-24 DIAGNOSIS — I10 Essential (primary) hypertension: Secondary | ICD-10-CM | POA: Diagnosis not present

## 2020-03-24 DIAGNOSIS — F17290 Nicotine dependence, other tobacco product, uncomplicated: Secondary | ICD-10-CM | POA: Diagnosis not present

## 2020-03-24 DIAGNOSIS — Z9981 Dependence on supplemental oxygen: Secondary | ICD-10-CM | POA: Diagnosis not present

## 2020-03-24 DIAGNOSIS — K2100 Gastro-esophageal reflux disease with esophagitis, without bleeding: Secondary | ICD-10-CM | POA: Diagnosis not present

## 2020-03-24 DIAGNOSIS — Z7902 Long term (current) use of antithrombotics/antiplatelets: Secondary | ICD-10-CM | POA: Diagnosis not present

## 2020-03-24 DIAGNOSIS — J441 Chronic obstructive pulmonary disease with (acute) exacerbation: Secondary | ICD-10-CM | POA: Diagnosis not present

## 2020-03-24 DIAGNOSIS — I2510 Atherosclerotic heart disease of native coronary artery without angina pectoris: Secondary | ICD-10-CM | POA: Diagnosis not present

## 2020-03-24 DIAGNOSIS — K2960 Other gastritis without bleeding: Secondary | ICD-10-CM | POA: Diagnosis not present

## 2020-03-25 ENCOUNTER — Other Ambulatory Visit: Payer: Self-pay

## 2020-03-25 ENCOUNTER — Encounter (HOSPITAL_COMMUNITY): Payer: Self-pay | Admitting: Psychiatry

## 2020-03-25 ENCOUNTER — Telehealth (INDEPENDENT_AMBULATORY_CARE_PROVIDER_SITE_OTHER): Payer: Medicare Other | Admitting: Psychiatry

## 2020-03-25 DIAGNOSIS — F419 Anxiety disorder, unspecified: Secondary | ICD-10-CM

## 2020-03-25 DIAGNOSIS — F330 Major depressive disorder, recurrent, mild: Secondary | ICD-10-CM

## 2020-03-25 DIAGNOSIS — J449 Chronic obstructive pulmonary disease, unspecified: Secondary | ICD-10-CM | POA: Diagnosis not present

## 2020-03-25 MED ORDER — BUSPIRONE HCL 5 MG PO TABS: 5.0000 mg | ORAL_TABLET | Freq: Two times a day (BID) | ORAL | 0 refills | Status: DC

## 2020-03-25 MED ORDER — MIRTAZAPINE 45 MG PO TABS: 45.0000 mg | ORAL_TABLET | Freq: Every day | ORAL | 0 refills | Status: DC

## 2020-03-25 NOTE — Progress Notes (Signed)
Virtual Visit via Telephone Note  I connected with [name] on 03/25/20 at 11:40 AM EST by telephone and verified that I am speaking with the correct person using two identifiers.  Location: Patient: Home Provider: Home Office   I discussed the limitations, risks, security and privacy concerns of performing an evaluation and management service by telephone and the availability of in person appointments. I also discussed with the patient that there may be a patient responsible charge related to this service. The patient expressed understanding and agreed to proceed.   History of Present Illness: Patient is evaluated by phone session.  He is taking mirtazapine and BuSpar and he feels things are going well.  He is concerned about his physical health lately is stable.  He is taking steroids may he feel that he had gained weight from the steroids.  Denies any irritability, crying spells, panic attack or any feeling of hopelessness or worthlessness.  He sleeps usually 7 to 8 hours.  He lives with his wife who is very supportive.  He has no tremors, shakes or any EPS.  He finished physical therapy after he have exacerbation of COPD and he was admitted on the hospital few months ago.  Patient does not want to change the medication since this is working.  Past Psychiatric History:Reviewed. H/Odepression since 1990. H/Osuicidal attempt. TriedZoloft,Paxil,Prozac, Vistaril, trazodoneand Wellbutrin but stopped due to side effects. H/Ousing cocaine, THCand alcohol for 20 years. Claims to be sober since coming to this office.   Psychiatric Specialty Exam: Physical Exam  Review of Systems  There were no vitals taken for this visit.There is no height or weight on file to calculate BMI.  General Appearance: NA  Eye Contact:  NA  Speech:  Slow  Volume:  Decreased  Mood:  Euthymic  Affect:  NA  Thought Process:  Descriptions of Associations: Intact  Orientation:  Full (Time, Place, and  Person)  Thought Content:  Logical  Suicidal Thoughts:  No  Homicidal Thoughts:  No  Memory:  Immediate;   Good Recent;   Fair Remote;   Fair  Judgement:  Intact  Insight:  Present  Psychomotor Activity:  NA  Concentration:  Concentration: Fair and Attention Span: Fair  Recall:  [employer] of Knowledge:  Good  Language:  Good  Akathisia:  No  Handed:  Right  AIMS (if indicated):     Assets:  Communication Skills Desire for Improvement Housing Social Support  ADL's:  Intact  Cognition:  WNL  Sleep:   ok      Assessment and Plan: Major depressive disorder, recurrent.  Anxiety.  Patient is a stable on his current medication.  I will continue BuSpar 5 mg twice a day and mirtazapine 45 mg at bedtime.  Discussed medication side effects and benefits.  Recommended to call us back if is any question or any concern.  Follow-up in 3 months.  Follow Up Instructions:    I discussed the assessment and treatment plan with the patient. The patient was provided an opportunity to ask questions and all were answered. The patient agreed with the plan and demonstrated an understanding of the instructions.   The patient was advised to call back or seek an in-person evaluation if the symptoms worsen or if the condition fails to improve as anticipated.  I provided 16 minutes of non-face-to-face time during this encounter.   Kathlee Nations, MD

## 2020-04-04 DIAGNOSIS — J441 Chronic obstructive pulmonary disease with (acute) exacerbation: Secondary | ICD-10-CM | POA: Diagnosis not present

## 2020-04-04 DIAGNOSIS — J9621 Acute and chronic respiratory failure with hypoxia: Secondary | ICD-10-CM | POA: Diagnosis not present

## 2020-04-04 DIAGNOSIS — I5032 Chronic diastolic (congestive) heart failure: Secondary | ICD-10-CM | POA: Diagnosis not present

## 2020-04-04 DIAGNOSIS — S2231XD Fracture of one rib, right side, subsequent encounter for fracture with routine healing: Secondary | ICD-10-CM | POA: Diagnosis not present

## 2020-04-24 DIAGNOSIS — J449 Chronic obstructive pulmonary disease, unspecified: Secondary | ICD-10-CM | POA: Diagnosis not present

## 2020-04-28 ENCOUNTER — Other Ambulatory Visit: Payer: Self-pay | Admitting: Internal Medicine

## 2020-04-29 DIAGNOSIS — R350 Frequency of micturition: Secondary | ICD-10-CM | POA: Diagnosis not present

## 2020-05-04 DIAGNOSIS — J441 Chronic obstructive pulmonary disease with (acute) exacerbation: Secondary | ICD-10-CM | POA: Diagnosis not present

## 2020-05-04 DIAGNOSIS — S2231XD Fracture of one rib, right side, subsequent encounter for fracture with routine healing: Secondary | ICD-10-CM | POA: Diagnosis not present

## 2020-05-04 DIAGNOSIS — I5032 Chronic diastolic (congestive) heart failure: Secondary | ICD-10-CM | POA: Diagnosis not present

## 2020-05-04 DIAGNOSIS — J9621 Acute and chronic respiratory failure with hypoxia: Secondary | ICD-10-CM | POA: Diagnosis not present

## 2020-05-13 ENCOUNTER — Other Ambulatory Visit: Payer: Self-pay | Admitting: Adult Health

## 2020-05-13 DIAGNOSIS — I25119 Atherosclerotic heart disease of native coronary artery with unspecified angina pectoris: Secondary | ICD-10-CM

## 2020-05-22 DIAGNOSIS — G4733 Obstructive sleep apnea (adult) (pediatric): Secondary | ICD-10-CM | POA: Diagnosis not present

## 2020-05-22 DIAGNOSIS — G4730 Sleep apnea, unspecified: Secondary | ICD-10-CM | POA: Diagnosis not present

## 2020-05-25 DIAGNOSIS — J449 Chronic obstructive pulmonary disease, unspecified: Secondary | ICD-10-CM | POA: Diagnosis not present

## 2020-06-04 DIAGNOSIS — S2231XD Fracture of one rib, right side, subsequent encounter for fracture with routine healing: Secondary | ICD-10-CM | POA: Diagnosis not present

## 2020-06-04 DIAGNOSIS — I5032 Chronic diastolic (congestive) heart failure: Secondary | ICD-10-CM | POA: Diagnosis not present

## 2020-06-04 DIAGNOSIS — J9621 Acute and chronic respiratory failure with hypoxia: Secondary | ICD-10-CM | POA: Diagnosis not present

## 2020-06-04 DIAGNOSIS — J441 Chronic obstructive pulmonary disease with (acute) exacerbation: Secondary | ICD-10-CM | POA: Diagnosis not present

## 2020-06-10 ENCOUNTER — Other Ambulatory Visit: Payer: Self-pay | Admitting: Internal Medicine

## 2020-06-23 ENCOUNTER — Telehealth (INDEPENDENT_AMBULATORY_CARE_PROVIDER_SITE_OTHER): Payer: Medicare Other | Admitting: Psychiatry

## 2020-06-23 ENCOUNTER — Other Ambulatory Visit: Payer: Self-pay

## 2020-06-23 ENCOUNTER — Encounter (HOSPITAL_COMMUNITY): Payer: Self-pay | Admitting: Psychiatry

## 2020-06-23 DIAGNOSIS — F330 Major depressive disorder, recurrent, mild: Secondary | ICD-10-CM

## 2020-06-23 DIAGNOSIS — G4733 Obstructive sleep apnea (adult) (pediatric): Secondary | ICD-10-CM | POA: Diagnosis not present

## 2020-06-23 DIAGNOSIS — F419 Anxiety disorder, unspecified: Secondary | ICD-10-CM

## 2020-06-23 MED ORDER — BUSPIRONE HCL 5 MG PO TABS: 5.0000 mg | ORAL_TABLET | Freq: Two times a day (BID) | ORAL | 0 refills | Status: AC

## 2020-06-23 MED ORDER — MIRTAZAPINE 45 MG PO TABS: 45.0000 mg | ORAL_TABLET | Freq: Every day | ORAL | 0 refills | Status: DC

## 2020-06-23 NOTE — Progress Notes (Signed)
Virtual Visit via Telephone Note  I connected with [name] on 06/23/20 at 11:40 AM EST by telephone and verified that I am speaking with the correct person using two identifiers.  Location: Patient: Home Provider: Home Office   I discussed the limitations, risks, security and privacy concerns of performing an evaluation and management service by telephone and the availability of in person appointments. I also discussed with the patient that there may be a patient responsible charge related to this service. The patient expressed understanding and agreed to proceed.   History of Present Illness: Patient is evaluated by phone session.  He is on the phone by himself.  He is taking mirtazapine and BuSpar.  Overall he feels his anxiety depression is a stable but sometimes struggle with sleep.  He has been taking prednisone 10 mg and he noticed since then he is having issues with sleep.  He uses CPAP.  He admitted weight gain since taking the prednisone but did not have a scale and have not checked his weight.  Denies any anger, mania, crying spells or any feeling of hopelessness.  He feels the current medicine is working well and he does not want to change.  He lives with his wife who is supportive.   Past Psychiatric History:Reviewed. H/Odepression since 1990. H/Osuicidal attempt. TriedZoloft,Paxil,Prozac, Vistaril, trazodoneand Wellbutrin but stopped due to side effects. H/Ousing cocaine, THCand alcohol for 20 years. Claims to be sober since coming to this office.   Psychiatric Specialty Exam: Physical Exam  Review of Systems  There were no vitals taken for this visit.There is no height or weight on file to calculate BMI.  General Appearance: NA  Eye Contact:  NA  Speech:  Slow  Volume:  Normal  Mood:  Euthymic  Affect:  NA  Thought Process:  Goal Directed  Orientation:  Full (Time, Place, and Person)  Thought Content:  Logical  Suicidal Thoughts:  No  Homicidal  Thoughts:  No  Memory:  Immediate;   Good Recent;   Fair Remote;   Fair  Judgement:  Intact  Insight:  Present  Psychomotor Activity:  NA  Concentration:  Concentration: Fair and Attention Span: Fair  Recall:  [employer] of Knowledge:  Fair  Language:  Good  Akathisia:  No  Handed:  Right  AIMS (if indicated):     Assets:  Communication Skills Desire for Improvement Housing Resilience Social Support  ADL's:  Intact  Cognition:  WNL  Sleep:   ok with cpap      Assessment and Plan: Depressive disorder, recurrent.  Anxiety.  Patient is a stable on his current medication.  I recommend to call his physician who was giving prednisone if does need to be adjusted as he noticed sometimes insomnia with prednisone.  If prednisone dose remains unchanged then he may consider taking melatonin.  His wife takes melatonin.  Discussed medication side effects and benefits.  Continue BuSpar 5 mg twice a day and mirtazapine 45 mg at bedtime.  Recommended to call us back with any question or any concern.  Follow-up in 3 months.     Follow Up Instructions:    I discussed the assessment and treatment plan with the patient. The patient was provided an opportunity to ask questions and all were answered. The patient agreed with the plan and demonstrated an understanding of the instructions.   The patient was advised to call back or seek an in-person evaluation if the symptoms worsen or if the condition fails  to improve as anticipated.  I provided 12 minutes of non-face-to-face time during this encounter.   Kathlee Nations, MD

## 2020-06-25 DIAGNOSIS — J449 Chronic obstructive pulmonary disease, unspecified: Secondary | ICD-10-CM | POA: Diagnosis not present

## 2020-07-05 DIAGNOSIS — J441 Chronic obstructive pulmonary disease with (acute) exacerbation: Secondary | ICD-10-CM | POA: Diagnosis not present

## 2020-07-05 DIAGNOSIS — I5032 Chronic diastolic (congestive) heart failure: Secondary | ICD-10-CM | POA: Diagnosis not present

## 2020-07-05 DIAGNOSIS — J9621 Acute and chronic respiratory failure with hypoxia: Secondary | ICD-10-CM | POA: Diagnosis not present

## 2020-07-05 DIAGNOSIS — S2231XD Fracture of one rib, right side, subsequent encounter for fracture with routine healing: Secondary | ICD-10-CM | POA: Diagnosis not present

## 2020-07-10 ENCOUNTER — Telehealth: Payer: Self-pay | Admitting: Internal Medicine

## 2020-07-10 DIAGNOSIS — J449 Chronic obstructive pulmonary disease, unspecified: Secondary | ICD-10-CM

## 2020-07-10 NOTE — Telephone Encounter (Signed)
Spoke with patient. I advised him that I received the form from the front desk and will fill it out to have MW to sign. He verbalized understanding. He wishes to receive a copy of the application once he has been signed. His wife will come by the office to pick it up.   Will place application on MW's desk for his signature.

## 2020-07-14 MED ORDER — BUDESONIDE-FORMOTEROL FUMARATE 160-4.5 MCG/ACT IN AERO: 2.0000 | INHALATION_SPRAY | Freq: Two times a day (BID) | RESPIRATORY_TRACT | 3 refills | Status: DC

## 2020-07-14 NOTE — Telephone Encounter (Signed)
done

## 2020-07-14 NOTE — Telephone Encounter (Signed)
Forms faxed with rx to [place] and me for Symbicort  Pt aware  Copy mailed to him per his request

## 2020-07-14 NOTE — Telephone Encounter (Signed)
Still needs to sign rx- will await this and then fax and let pt know. Forwarding to myself to f/u on.

## 2020-07-21 DIAGNOSIS — G4733 Obstructive sleep apnea (adult) (pediatric): Secondary | ICD-10-CM | POA: Diagnosis not present

## 2020-07-23 DIAGNOSIS — J449 Chronic obstructive pulmonary disease, unspecified: Secondary | ICD-10-CM | POA: Diagnosis not present

## 2020-07-28 ENCOUNTER — Ambulatory Visit: Payer: Medicare Other | Admitting: Dermatology

## 2020-08-02 DIAGNOSIS — S2231XD Fracture of one rib, right side, subsequent encounter for fracture with routine healing: Secondary | ICD-10-CM | POA: Diagnosis not present

## 2020-08-02 DIAGNOSIS — J9621 Acute and chronic respiratory failure with hypoxia: Secondary | ICD-10-CM | POA: Diagnosis not present

## 2020-08-02 DIAGNOSIS — J441 Chronic obstructive pulmonary disease with (acute) exacerbation: Secondary | ICD-10-CM | POA: Diagnosis not present

## 2020-08-02 DIAGNOSIS — I5032 Chronic diastolic (congestive) heart failure: Secondary | ICD-10-CM | POA: Diagnosis not present

## 2020-08-06 DIAGNOSIS — R5383 Other fatigue: Secondary | ICD-10-CM | POA: Diagnosis not present

## 2020-08-06 DIAGNOSIS — N390 Urinary tract infection, site not specified: Secondary | ICD-10-CM | POA: Diagnosis not present

## 2020-08-06 DIAGNOSIS — R54 Age-related physical debility: Secondary | ICD-10-CM | POA: Diagnosis not present

## 2020-08-14 ENCOUNTER — Telehealth: Payer: Self-pay | Admitting: Internal Medicine

## 2020-08-14 DIAGNOSIS — J449 Chronic obstructive pulmonary disease, unspecified: Secondary | ICD-10-CM

## 2020-08-14 NOTE — Telephone Encounter (Signed)
Called and left message on voicemail to please return phone call. Contact number provided. 

## 2020-08-14 NOTE — Telephone Encounter (Signed)
Spoke with pt who states that he is unable to leave his home related to be wheelchair bound and not have access to outside because he does not have a ramp. Pt states that if we were to fax an order into his insurance company the would pay the rental fee of a ramp that [employer] has in it's retail store. Pt is requesting this order from Dr. Melvyn Novas because is PCP is out on medical leave. Dr. Melvyn Novas may we place an order for pt to have wheelchair ramp?

## 2020-08-14 NOTE — Telephone Encounter (Signed)
Fine with me

## 2020-08-19 ENCOUNTER — Telehealth: Payer: Self-pay | Admitting: Family Medicine

## 2020-08-19 NOTE — Telephone Encounter (Signed)
   [name] DOB: February 27, 1951 MRN: [id number]   RIDER WAIVER AND RELEASE OF LIABILITY  For purposes of improving physical access to our facilities, [facility] is pleased to partner with third parties to provide [facility] patients or other authorized individuals the option of convenient, on-demand ground transportation services (the [place]") through use of the technology service that enables users to request on-demand ground transportation from independent third-party providers.  By opting to use and accept these [employer], I, the undersigned, hereby agree on behalf of myself, and on behalf of any minor child using the [employer] for whom I am the parent or legal guardian, as follows:  1. Government social research officer provided to me are provided by independent third-party transportation providers who are not [employer] or employees and who are unaffiliated with [employer]. 2. [employer] is neither a transportation carrier nor a common or public carrier. 3. [name] has no control over the quality or safety of the transportation that occurs as a result of the [employer]. 4. [facility] cannot guarantee that any third-party transportation provider will complete any arranged transportation service. 5. [facility] makes no representation, warranty, or guarantee regarding the reliability, timeliness, quality, safety, suitability, or availability of any of the Transport Services or that they will be error free. 6. I fully understand that traveling by vehicle involves risks and dangers of serious bodily injury, including permanent disability, paralysis, and death. I agree, on behalf of myself and on behalf of any minor child using the Transport Services for whom I am the parent or legal guardian, that the entire risk arising out of my use of the [employer] remains solely with me, to the maximum extent permitted under applicable law. 7. The [employer] are provided "as is" and "as available." [facility] disclaims all representations and warranties, express, implied or statutory, not expressly set out in these terms, including the implied warranties of merchantability and fitness for a particular purpose. 8. I hereby waive and release [name], its agents, employees, officers, directors, representatives, insurers, attorneys, assigns, successors, subsidiaries, and affiliates from any and all past, present, or future claims, demands, liabilities, actions, causes of action, or suits of any kind directly or indirectly arising from acceptance and use of the [employer]. 9. I further waive and release [name] and its affiliates from all present and future liability and responsibility for any injury or death to persons or damages to property caused by or related to the use of the [employer]. 10. I have read this Waiver and Release of Liability, and I understand the terms used in it and their legal significance. This Waiver is freely and voluntarily given with the understanding that my right (as well as the right of any minor child for whom I am the parent or legal guardian using the [employer]) to legal recourse against [employer] in connection with the [employer] is knowingly surrendered in return for use of these services.   I attest that I read the consent document to [name], gave [name] the opportunity to ask questions and answered the questions asked (if any). I affirm that [name] then provided consent for he's participation in this program.     [name]

## 2020-08-20 DIAGNOSIS — G4733 Obstructive sleep apnea (adult) (pediatric): Secondary | ICD-10-CM | POA: Diagnosis not present

## 2020-08-21 DIAGNOSIS — G4733 Obstructive sleep apnea (adult) (pediatric): Secondary | ICD-10-CM | POA: Diagnosis not present

## 2020-08-21 NOTE — Telephone Encounter (Signed)
Order placed for wheelchair ramp. Called and spoke to pt. Informed him of the order sent to [employer]. Pt verbalized understanding. While on the phone the pt also states he has had trouble reaching Dr. Blanchie Serve office to have blood work done. I called pt's PCP, Dr. Sheppard Coil, and they stated they would call pt back. Called and left detailed message for pt regarding his PCP calling him. Nothing further needed at this time.

## 2020-08-23 DIAGNOSIS — J449 Chronic obstructive pulmonary disease, unspecified: Secondary | ICD-10-CM | POA: Diagnosis not present

## 2020-09-01 ENCOUNTER — Telehealth: Payer: Self-pay | Admitting: Internal Medicine

## 2020-09-01 NOTE — Telephone Encounter (Signed)
Received faxed refill request from pt calling for refill   Medication name/strength/dose: prednisone 10 mg Medication last rx'd: 06/10/2020 Quantity and number of refills last rx'd: 100 with no refills Instructions: take 2 daily until feeling better then 1 daily thereafter  Last OV: 03/11/2020 Next OV: no pending appts  MW please advise on refill request  No Known Allergies Current Outpatient Medications on File Prior to Visit  Medication Sig Dispense Refill  . acetaminophen (TYLENOL) 325 MG tablet Take 650 mg by mouth every 6 (six) hours as needed.    Marland Kitchen albuterol (PROAIR HFA) 108 (90 Base) MCG/ACT inhaler 2 puffs every 4 hours as needed only  if your can't catch your breath 18 g 0  . amLODipine (NORVASC) 5 MG tablet Take 1 tablet (5 mg total) by mouth at bedtime. 30 tablet 0  . aspirin EC 81 MG tablet Take 81 mg by mouth at bedtime.    Marland Kitchen atenolol (TENORMIN) 25 MG tablet Take 1 tablet (25 mg total) by mouth at bedtime. 30 tablet 0  . budesonide-formoterol (SYMBICORT) 160-4.5 MCG/ACT inhaler Inhale 2 puffs into the lungs 2 (two) times daily. 30.6 g 3  . busPIRone (BUSPAR) 5 MG tablet Take 1 tablet (5 mg total) by mouth 2 (two) times daily. 180 tablet 0  . clopidogrel (PLAVIX) 75 MG tablet Take 1 tablet (75 mg total) by mouth at bedtime. 30 tablet 0  . Coenzyme Q10 (COQ10) 100 MG CAPS Take 1 capsule by mouth at bedtime.     . feeding supplement, ENSURE ENLIVE, (ENSURE ENLIVE) LIQD Take 237 mLs by mouth daily.    Marland Kitchen guaiFENesin (MUCINEX) 600 MG 12 hr tablet Take 600 mg by mouth 2 (two) times daily.    . mirtazapine (REMERON) 45 MG tablet Take 1 tablet (45 mg total) by mouth at bedtime. 90 tablet 0  . Multiple Vitamin (MULTIVITAMIN WITH MINERALS) TABS Take 1 tablet by mouth at bedtime.     . nitroGLYCERIN (NITROSTAT) 0.4 MG SL tablet Place 1 tablet (0.4 mg total) under the tongue every 5 (five) minutes as needed for chest pain. 25 tablet 0  . Nutritional Supplements (ENSURE CLEAR) LIQD Take  237 mLs by mouth daily.    . OXYGEN Inhale 4 L into the lungs continuous. With exertion only     . pravastatin (PRAVACHOL) 40 MG tablet Take 1 tablet (40 mg total) by mouth daily. Taking at bedtime 30 tablet 0  . predniSONE (DELTASONE) 10 MG tablet TAKE 2 TABLETS BY MOUTH IN THE MORNING UNTIL  BETTER  THEN  1  TABLET  BY  MOUTH  DAILY  THEREAFTER 100 tablet 0  . sennosides-docusate sodium (SENOKOT-S) 8.6-50 MG tablet Take 1 tablet by mouth daily. 30 tablet 0  . sodium chloride (OCEAN) 0.65 % SOLN nasal spray Place 2 sprays into both nostrils as needed for congestion.    Marland Kitchen testosterone cypionate (DEPOTESTOSTERONE CYPIONATE) 200 MG/ML injection Inject 200 mg into the muscle every 14 (fourteen) days.    . Tiotropium Bromide Monohydrate (SPIRIVA RESPIMAT) 2.5 MCG/ACT AERS Inhale 2 puffs into the lungs daily. 4 g 0  . vitamin B-12 (CYANOCOBALAMIN) 1000 MCG tablet Take 1,000 mcg by mouth daily.      No current facility-administered medications on file prior to visit.

## 2020-09-02 DIAGNOSIS — J441 Chronic obstructive pulmonary disease with (acute) exacerbation: Secondary | ICD-10-CM | POA: Diagnosis not present

## 2020-09-02 DIAGNOSIS — J9621 Acute and chronic respiratory failure with hypoxia: Secondary | ICD-10-CM | POA: Diagnosis not present

## 2020-09-02 DIAGNOSIS — I5032 Chronic diastolic (congestive) heart failure: Secondary | ICD-10-CM | POA: Diagnosis not present

## 2020-09-02 DIAGNOSIS — S2231XD Fracture of one rib, right side, subsequent encounter for fracture with routine healing: Secondary | ICD-10-CM | POA: Diagnosis not present

## 2020-09-02 MED ORDER — PREDNISONE 10 MG PO TABS: ORAL_TABLET | ORAL | 0 refills | Status: DC

## 2020-09-02 NOTE — Telephone Encounter (Signed)
Refill x one done  Ov before more

## 2020-09-02 NOTE — Telephone Encounter (Signed)
rx has already been sent to pharmacy.  lmtcb X1 for pt to schedule rov

## 2020-09-03 NOTE — Telephone Encounter (Signed)
I called and spoke with patient in regards to making a f/u appt. Patient stated he cannot leave house until he has a ramp at his house. This order was sent to [facility] on 08/21/2020. I told patient I would send in recall for 1 month to see if ramp situation is handled by then. Patient verbalized understanding, recall placed.

## 2020-09-04 ENCOUNTER — Other Ambulatory Visit (HOSPITAL_COMMUNITY): Payer: Self-pay | Admitting: Psychiatry

## 2020-09-04 DIAGNOSIS — F419 Anxiety disorder, unspecified: Secondary | ICD-10-CM

## 2020-09-18 ENCOUNTER — Other Ambulatory Visit: Payer: Self-pay

## 2020-09-18 ENCOUNTER — Telehealth (INDEPENDENT_AMBULATORY_CARE_PROVIDER_SITE_OTHER): Payer: Medicare Other | Admitting: Psychiatry

## 2020-09-18 DIAGNOSIS — F419 Anxiety disorder, unspecified: Secondary | ICD-10-CM

## 2020-09-18 DIAGNOSIS — F330 Major depressive disorder, recurrent, mild: Secondary | ICD-10-CM | POA: Diagnosis not present

## 2020-09-18 MED ORDER — GABAPENTIN 100 MG PO CAPS: 100.0000 mg | ORAL_CAPSULE | Freq: Two times a day (BID) | ORAL | 0 refills | Status: DC

## 2020-09-18 MED ORDER — MIRTAZAPINE 45 MG PO TABS: 45.0000 mg | ORAL_TABLET | Freq: Every day | ORAL | 0 refills | Status: AC

## 2020-09-18 NOTE — Progress Notes (Signed)
Virtual Visit via Telephone Note  I connected with [name] on 09/18/20 at 11:40 AM EDT by telephone and verified that I am speaking with the correct person using two identifiers.  Location: Patient: Home Provider: Home Office   I discussed the limitations, risks, security and privacy concerns of performing an evaluation and management service by telephone and the availability of in person appointments. I also discussed with the patient that there may be a patient responsible charge related to this service. The patient expressed understanding and agreed to proceed.   History of Present Illness: Patient is evaluated by phone session.  He is taking BuSpar and mirtazapine.  Overall he feels his anxiety is under control but lately he noticed sometimes he feels overwhelmed and nervous. He has chronic pain and in the past he has taken Naprosyn but it was discontinued.  He noticed increase in his pain makes him more anxious and overwhelmed.  His PCP Dr. Gwendolyn Fill stalling had left the practice.  Due to homebound he is not able to go to his PCP in person but is still taking all his medication because refilled recently.  He uses CPAP.  He is taking prednisone prescribed by pulmonologist.  He has no blood work in a while.  He has not checked his weight in more than a year.  He failed current medicine helping and he denies any panic attack, crying spells, feeling of hopelessness or worthlessness.  However he like to try something else that can help his pain and anxiety.  He had left messages to his previous PCP but has not returned any his phone call.  He lives with his wife who is supportive.  He uses wheelchair at home.  Denies drinking or using any illegal substances.  He feels oxygen has helped his COPD and he is breathing better.  Past Psychiatric History:Reviewed. H/Odepression since 1990. H/Osuicidal attempt. TriedZoloft,Paxil,Prozac, Vistaril, trazodoneand Wellbutrin but stopped due to side  effects. H/Ousing cocaine, THCand alcohol for 20 years. Claims to be sober since coming to this office.    Psychiatric Specialty Exam: Physical Exam  Review of Systems  There were no vitals taken for this visit.There is no height or weight on file to calculate BMI.  General Appearance: NA  Eye Contact:  NA  Speech:  Slow  Volume:  Normal  Mood:  Anxious  Affect:  NA  Thought Process:  Goal Directed  Orientation:  Full (Time, Place, and Person)  Thought Content:  Logical  Suicidal Thoughts:  No  Homicidal Thoughts:  No  Memory:  Immediate;   Good Recent;   Fair Remote;   Fair  Judgement:  Intact  Insight:  Present  Psychomotor Activity:  NA  Concentration:  Concentration: Fair and Attention Span: Fair  Recall:  [employer] of Knowledge:  Good  Language:  Good  Akathisia:  No  Handed:  Right  AIMS (if indicated):     Assets:  Communication Skills Desire for Improvement Housing Social Support  ADL's:  Intact  Cognition:  WNL  Sleep:   on CPAP      Assessment and Plan: Major depressive disorder, recurrent.  Anxiety.  Discussed his chronic pain.  I recommend we can try gabapentin instead of BuSpar to help his anxiety and pain however if it did not help then he may need to go back on BuSpar.  I encourage to contact his insurance company to find a new PCP as he does need blood work.  I explained that  most of the visits nowadays can be worked well and can be done remotely.  We agree with the plan.  We will continue mirtazapine 45 mg at bedtime.  If patient do not feel gabapentin helping his anxiety then we will resume his BuSpar.  He like to have a refill gabapentin at local pharmacy.  He like to follow up in 4 weeks.  I recommend to call us back if there is any question or any concern.  Follow-up in 4 weeks.   Follow Up Instructions:    I discussed the assessment and treatment plan with the patient. The patient was provided an opportunity to ask questions and all were  answered. The patient agreed with the plan and demonstrated an understanding of the instructions.   The patient was advised to call back or seek an in-person evaluation if the symptoms worsen or if the condition fails to improve as anticipated.  I provided 19 minutes of non-face-to-face time during this encounter.   Kathlee Nations, MD

## 2020-09-19 ENCOUNTER — Telehealth (HOSPITAL_COMMUNITY): Payer: Self-pay | Admitting: *Deleted

## 2020-09-19 NOTE — Telephone Encounter (Signed)
This nurse spoke with pt regarding establishing care with new PCP. Pt provided with address and phone number for [facility] as they are taking new pts and also see walk-ins. Nurse was told that visits are in person but a special exception can be made. Pt advised of this. Pt verbalizes understanding and states he will call [facility].

## 2020-09-20 DIAGNOSIS — G4733 Obstructive sleep apnea (adult) (pediatric): Secondary | ICD-10-CM | POA: Diagnosis not present

## 2020-09-22 DIAGNOSIS — J449 Chronic obstructive pulmonary disease, unspecified: Secondary | ICD-10-CM | POA: Diagnosis not present

## 2020-10-15 ENCOUNTER — Other Ambulatory Visit (HOSPITAL_COMMUNITY): Payer: Self-pay | Admitting: Psychiatry

## 2020-10-15 DIAGNOSIS — F419 Anxiety disorder, unspecified: Secondary | ICD-10-CM

## 2020-10-20 ENCOUNTER — Other Ambulatory Visit: Payer: Self-pay

## 2020-10-20 ENCOUNTER — Telehealth (INDEPENDENT_AMBULATORY_CARE_PROVIDER_SITE_OTHER): Payer: Medicare Other | Admitting: Psychiatry

## 2020-10-20 DIAGNOSIS — F419 Anxiety disorder, unspecified: Secondary | ICD-10-CM

## 2020-10-20 DIAGNOSIS — F330 Major depressive disorder, recurrent, mild: Secondary | ICD-10-CM

## 2020-10-20 MED ORDER — MECLIZINE HCL 25 MG PO TABS: 25.0000 mg | ORAL_TABLET | Freq: Every day | ORAL | 0 refills | Status: AC

## 2020-10-20 MED ORDER — GABAPENTIN 100 MG PO CAPS: 100.0000 mg | ORAL_CAPSULE | Freq: Two times a day (BID) | ORAL | 0 refills | Status: AC

## 2020-10-20 NOTE — Progress Notes (Signed)
Virtual Visit via Telephone Note  I connected with [name] on 10/20/20 at  3:20 PM EDT by telephone and verified that I am speaking with the correct person using two identifiers.  Location: Patient: Home Provider: Home Office   I discussed the limitations, risks, security and privacy concerns of performing an evaluation and management service by telephone and the availability of in person appointments. I also discussed with the patient that there may be a patient responsible charge related to this service. The patient expressed understanding and agreed to proceed.   History of Present Illness: Patient is evaluated by phone session.  He started him on gabapentin which is helping his chronic pain.  He is no longer taking BuSpar.  He admitted having difficulty to keep appointment with his PCP who is left the practice and now waiting for new physician.  He is almost out of the meclizine which he takes for vertigo.  He need a 1 month supply until he can contact his PCP office.  Overall he feels things are better but concerned about her general physical health.  He is not able to ambulate as much and requires wheelchair.  His other issue is that he uses wheelchair and he does not have a ramp at his house and he cannot go outside.  He lives with his wife who is supportive.  Denies any drinking or using any illegal substances.  He uses CPAP and oxygen which helps his breathing.  He reported no issues or tremors from the gabapentin.  His appetite is okay.  Past Psychiatric History:Reviewed. H/Odepression since 1990. H/Osuicidal attempt. TriedZoloft,Paxil,Prozac, Vistaril, trazodoneand Wellbutrin but stopped due to side effects. H/Ousing cocaine, THCand alcohol for 20 years. Claims to be sober since coming to this office.  Psychiatric Specialty Exam: Physical Exam  Review of Systems  There were no vitals taken for this visit.There is no height or weight on file to calculate BMI.   General Appearance: NA  Eye Contact:  NA  Speech:  Slow  Volume:  Decreased  Mood:  Anxious  Affect:  NA  Thought Process:  Descriptions of Associations: Intact  Orientation:  Full (Time, Place, and Person)  Thought Content:  Rumination  Suicidal Thoughts:  No  Homicidal Thoughts:  No  Memory:  Immediate;   Fair Recent;   Fair Remote;   Fair  Judgement:  Fair  Insight:  Shallow  Psychomotor Activity:  NA  Concentration:  Concentration: Fair and Attention Span: Fair  Recall:  [employer] of Knowledge:  Fair  Language:  Fair  Akathisia:  No  Handed:  Right  AIMS (if indicated):     Assets:  Communication Skills Desire for Improvement Housing Social Support  ADL's:  Intact  Cognition:  WNL  Sleep:   On CPAP    Assessment and Plan: Major depressive disorder, recurrent.  Anxiety.  Patient like gabapentin and is taking 100 mg twice a day.  He is no longer taking BuSpar.  He like to get 1 month refill of meclizine until he contact his PCP office.  He reported meclizine helps his vertigo and he does not want to be without medication.  We will provide a 30-day supply of meclizine until he contact his PCP.  We discussed to find resources to help building a ramp so he can go outside.  In the meantime I recommend try to get virtual visit with his PCP.  He promised to give them a call to schedule appointment.  Continue gabapentin  100 mg twice a day and mirtazapine 45 mg at bedtime.  Recommended to call us back if is any question or any concern.  Follow-up in 2 months.   Follow Up Instructions:    I discussed the assessment and treatment plan with the patient. The patient was provided an opportunity to ask questions and all were answered. The patient agreed with the plan and demonstrated an understanding of the instructions.   The patient was advised to call back or seek an in-person evaluation if the symptoms worsen or if the condition fails to improve as anticipated.  I provided 16  minutes of non-face-to-face time during this encounter.   Kathlee Nations, MD

## 2020-10-21 DIAGNOSIS — G4733 Obstructive sleep apnea (adult) (pediatric): Secondary | ICD-10-CM | POA: Diagnosis not present

## 2020-10-23 DIAGNOSIS — J449 Chronic obstructive pulmonary disease, unspecified: Secondary | ICD-10-CM | POA: Diagnosis not present

## 2020-10-24 ENCOUNTER — Telehealth: Payer: Self-pay | Admitting: Internal Medicine

## 2020-10-24 MED ORDER — PREDNISONE 10 MG PO TABS: ORAL_TABLET | ORAL | 0 refills | Status: DC

## 2020-10-24 NOTE — Telephone Encounter (Signed)
I called and spoke with patient regarding Beth recs. Patient verbalized understanding and I sent in refill for prednisone to preferred pharmacy. He has a televist with Beth since Dr. Melvyn Novas schedule in full until July. Patient verbalized understanding, nothing further needed.

## 2020-10-24 NOTE — Telephone Encounter (Signed)
Yes I am ok with this d.t the situation. You can refill prednisone once. Please see if you can scheduled televisit with Dr. Melvyn Novas since he has not been seen since 2020.

## 2020-10-24 NOTE — Telephone Encounter (Signed)
Called and spoke with patient, he states he needs a refill of his prednisone, he takes 2 tabs daily and he has 14 left.  I advised him that he has not had a visit since 04/23/19 and he has to be seen or at least do a televisit.  He currently cannot leave his home, he is awaiting a ramp to be placed at his home in 3-4 wks.  I advised him I would tentatively schedule him for a televisit on 11/04/20 and that I would let Dr. Melvyn Novas and Geraldo Pitter NP know the situation to make sure the televisit is ok to do and to see if enough prednisone can be prescribed until his televisit on 6/21.    Beth/Dr. Melvyn Novas please advise.  Thank you  Assessment and Plan:   COPD exacerbation: - Cough has resolved. Never had shortness of breath. - Declined CXR during initial televisit - Completed course of azithromycin and prednisone 20mg  x 5 days - Continue Dulera 200 and Spiriva Respimat   Chronic respiratory failure with hypoxia: - Improved/stable; O2 92% on 4L (baseline 3.5-4L) - If continues to require more oxygen than baseline patient needs CXR    Decreased appetite: - Unclear cause, possibly viral - Associated abdominal pain 2-3 days ago which has resolved - No change in BM, vomiting or fevers. Denies weight changes. - Instructed patient to follow-up with PCP, if persistent recommend basic labs    Follow Up Instructions:   - Due for follow-up in January with Dr. Melvyn Novas    I discussed the assessment and treatment plan with the patient. The patient was provided an opportunity to ask questions and all were answered. The patient agreed with the plan and demonstrated an understanding of the instructions.   The patient was advised to call back or seek an in-person evaluation if the symptoms worsen or if the condition fails to improve as anticipated.   I provided 25 minutes of non-face-to-face time during this encounter.     Martyn Ehrich, NP           Patient Instructions by Martyn Ehrich, NP at  04/23/2019 11:00 AM  Author: Martyn Ehrich, NP Author Type: Nurse Practitioner Filed: 04/23/2019 11:51 AM  Note Status: Addendum Cosign: Cosign Not Required Encounter Date: 04/23/2019  Editor: Martyn Ehrich, NP (Nurse Practitioner)      Prior Versions: 1. Martyn Ehrich, NP (Nurse Practitioner) at 04/23/2019 11:49 AM - Signed    COPD: - Continue Dulera 200 two puffs twice daily - Continue Spiriva Respimat two puffs once daily    Chronic respiratory failure with hypoxia: - Improved, requiring less oxygen - O2 92% on 4L  - If you continue to require more oxygen than your baseline you will need a CXR    Decreased appetite/abdominal pain: - Recommend you follow-up with PCP ([name]), if persistent recommend basic labs    Follow-up: Due for visit with Dr. Melvyn Novas in January          Instructions    Return in about 4 weeks (around 05/21/2019).  COPD: - Continue Dulera 200 two puffs twice daily - Continue Spiriva Respimat two puffs once daily    Chronic respiratory failure with hypoxia: - Improved, requiring less oxygen - O2 92% on 4L  - If you continue to require more oxygen than your baseline you will need a CXR    Decreased appetite/abdominal pain: - Recommend you follow-up with PCP ([name]), if persistent recommend basic labs  Follow-up: Due for visit with Dr. Melvyn Novas in January

## 2020-10-30 ENCOUNTER — Other Ambulatory Visit: Payer: Self-pay | Admitting: Adult Health

## 2020-10-30 DIAGNOSIS — J449 Chronic obstructive pulmonary disease, unspecified: Secondary | ICD-10-CM

## 2020-11-04 ENCOUNTER — Encounter: Payer: Self-pay | Admitting: Primary Care

## 2020-11-04 ENCOUNTER — Other Ambulatory Visit: Payer: Self-pay

## 2020-11-04 ENCOUNTER — Ambulatory Visit (INDEPENDENT_AMBULATORY_CARE_PROVIDER_SITE_OTHER): Payer: Medicare Other | Admitting: Primary Care

## 2020-11-04 DIAGNOSIS — J9611 Chronic respiratory failure with hypoxia: Secondary | ICD-10-CM | POA: Diagnosis not present

## 2020-11-04 DIAGNOSIS — J449 Chronic obstructive pulmonary disease, unspecified: Secondary | ICD-10-CM | POA: Diagnosis not present

## 2020-11-04 MED ORDER — PREDNISONE 10 MG PO TABS: ORAL_TABLET | ORAL | 3 refills | Status: AC

## 2020-11-04 MED ORDER — AZITHROMYCIN 250 MG PO TABS: ORAL_TABLET | ORAL | 0 refills | Status: AC

## 2020-11-04 MED ORDER — SPIRIVA RESPIMAT 2.5 MCG/ACT IN AERS: 2.0000 | INHALATION_SPRAY | Freq: Every day | RESPIRATORY_TRACT | 0 refills | Status: AC

## 2020-11-04 MED ORDER — BUDESONIDE-FORMOTEROL FUMARATE 160-4.5 MCG/ACT IN AERO: 2.0000 | INHALATION_SPRAY | Freq: Two times a day (BID) | RESPIRATORY_TRACT | 3 refills | Status: AC

## 2020-11-04 NOTE — Patient Instructions (Signed)
No changes to maintenance regiment, refills sent to pharmacy

## 2020-11-04 NOTE — Progress Notes (Signed)
Virtual Visit via Telephone Note  I connected with [name] on 11/04/20 at  9:30 AM EDT by telephone and verified that I am speaking with the correct person using two identifiers.  Location: Patient: Home Provider: Office   I discussed the limitations, risks, security and privacy concerns of performing an evaluation and management service by telephone and the availability of in person appointments. I also discussed with the patient that there may be a patient responsible charge related to this service. The patient expressed understanding and agreed to proceed.   Brief patient profile:  70  yobm quit smoking 12/03/14 on disability due to depression since 1994 baseline on spiriva with  doe room to room s 02 then underwent L THR 03/2013  at [facility] and noted even in hosp need 02 with any activity so referred to pulmonary clinic 05/01/2013 by [facility] with GOLD III copd with mild reversibility documented 06/12/13  11/04/2020 - Interim hx  Patient contacted today for televisit/ 8 month FU. Chronic steriod dependence. He is on 10mg  prednisone daily and can go up to 20mg  when acutely ill until better. He called our office on 10/24/20 needing refill which we provided and notified him that he would need to make a follow-up with our office as he was last seen back in October 2021.   He reports having a cough with thick yellow mucus x 2 weeks. He is  takes mucinex 1200mg  twice a day without much relief. He uses saline nasal spray and genric zyrtec. He takes prednisone 10mg  daily. Using 3.5-4L oxygen, no new requirement. He quit smoking but still vapes  Dyspnea: Around the house, does not go out much  Cough: Productive with yellow sputum  Nocturnal symptoms: None, sleeps with one pillow O2: 3-4.5L     Observations/Objective:  - Somewhat difficult to understand his speech at time; No overt shortness of breath, wheezing or cough   Assessment and Plan:  COPD GOLD III with acute exacerbation:  -  Productive cough with yellow mucus x 2 weeks - Continue chronic prednisone 10mg  daily (Ceiling 20mg  and floor 10mg ) - Continue Symbicort 160 2 puffs BID and Spiriva - Continue Mucinex 1200mg  BID  - RX: Zpack as directed, refills sent   Chronic respiratory failure: - Continues to benefit from supplemental oxygen, no new requirements  - Maintained on 3.5-4L oxygen  Follow Up Instructions:   - 3 months with Dr. Melvyn Novas or APP virtual visit   I discussed the assessment and treatment plan with the patient. The patient was provided an opportunity to ask questions and all were answered. The patient agreed with the plan and demonstrated an understanding of the instructions.   The patient was advised to call back or seek an in-person evaluation if the symptoms worsen or if the condition fails to improve as anticipated.  I provided 25 minutes of non-face-to-face time during this encounter.   Martyn Ehrich, NP

## 2020-11-20 DIAGNOSIS — G4733 Obstructive sleep apnea (adult) (pediatric): Secondary | ICD-10-CM | POA: Diagnosis not present

## 2020-11-22 DIAGNOSIS — J449 Chronic obstructive pulmonary disease, unspecified: Secondary | ICD-10-CM | POA: Diagnosis not present

## 2020-11-24 ENCOUNTER — Other Ambulatory Visit: Payer: Self-pay | Admitting: Adult Health

## 2020-11-24 ENCOUNTER — Other Ambulatory Visit (HOSPITAL_COMMUNITY): Payer: Self-pay | Admitting: Psychiatry

## 2020-11-24 DIAGNOSIS — I10 Essential (primary) hypertension: Secondary | ICD-10-CM

## 2020-11-24 NOTE — Telephone Encounter (Signed)
PCP is Delia Chimes MD

## 2020-12-08 ENCOUNTER — Emergency Department (HOSPITAL_COMMUNITY): Payer: Medicare Other

## 2020-12-08 ENCOUNTER — Telehealth: Payer: Self-pay | Admitting: Internal Medicine

## 2020-12-08 ENCOUNTER — Inpatient Hospital Stay (HOSPITAL_COMMUNITY): Payer: Medicare Other

## 2020-12-08 ENCOUNTER — Inpatient Hospital Stay (HOSPITAL_COMMUNITY)
Admission: EM | Admit: 2020-12-08 | Discharge: 2021-01-15 | DRG: 870 | Disposition: E | Payer: Medicare Other | Attending: Internal Medicine | Admitting: Internal Medicine

## 2020-12-08 DIAGNOSIS — M1990 Unspecified osteoarthritis, unspecified site: Secondary | ICD-10-CM | POA: Diagnosis present

## 2020-12-08 DIAGNOSIS — F32A Depression, unspecified: Secondary | ICD-10-CM | POA: Diagnosis not present

## 2020-12-08 DIAGNOSIS — F17210 Nicotine dependence, cigarettes, uncomplicated: Secondary | ICD-10-CM | POA: Diagnosis not present

## 2020-12-08 DIAGNOSIS — Z8249 Family history of ischemic heart disease and other diseases of the circulatory system: Secondary | ICD-10-CM

## 2020-12-08 DIAGNOSIS — I499 Cardiac arrhythmia, unspecified: Secondary | ICD-10-CM | POA: Diagnosis not present

## 2020-12-08 DIAGNOSIS — Z20822 Contact with and (suspected) exposure to covid-19: Secondary | ICD-10-CM | POA: Diagnosis present

## 2020-12-08 DIAGNOSIS — N170 Acute kidney failure with tubular necrosis: Secondary | ICD-10-CM | POA: Diagnosis not present

## 2020-12-08 DIAGNOSIS — Z7189 Other specified counseling: Secondary | ICD-10-CM | POA: Diagnosis not present

## 2020-12-08 DIAGNOSIS — J80 Acute respiratory distress syndrome: Secondary | ICD-10-CM | POA: Diagnosis not present

## 2020-12-08 DIAGNOSIS — Z9289 Personal history of other medical treatment: Secondary | ICD-10-CM | POA: Diagnosis not present

## 2020-12-08 DIAGNOSIS — I2510 Atherosclerotic heart disease of native coronary artery without angina pectoris: Secondary | ICD-10-CM | POA: Diagnosis present

## 2020-12-08 DIAGNOSIS — E1151 Type 2 diabetes mellitus with diabetic peripheral angiopathy without gangrene: Secondary | ICD-10-CM | POA: Diagnosis not present

## 2020-12-08 DIAGNOSIS — R6520 Severe sepsis without septic shock: Secondary | ICD-10-CM | POA: Diagnosis not present

## 2020-12-08 DIAGNOSIS — I472 Ventricular tachycardia: Secondary | ICD-10-CM | POA: Diagnosis not present

## 2020-12-08 DIAGNOSIS — J1520 Pneumonia due to staphylococcus, unspecified: Secondary | ICD-10-CM | POA: Diagnosis not present

## 2020-12-08 DIAGNOSIS — J9690 Respiratory failure, unspecified, unspecified whether with hypoxia or hypercapnia: Secondary | ICD-10-CM | POA: Diagnosis not present

## 2020-12-08 DIAGNOSIS — Z7952 Long term (current) use of systemic steroids: Secondary | ICD-10-CM

## 2020-12-08 DIAGNOSIS — Z7982 Long term (current) use of aspirin: Secondary | ICD-10-CM

## 2020-12-08 DIAGNOSIS — F419 Anxiety disorder, unspecified: Secondary | ICD-10-CM | POA: Diagnosis present

## 2020-12-08 DIAGNOSIS — Z452 Encounter for adjustment and management of vascular access device: Secondary | ICD-10-CM | POA: Diagnosis not present

## 2020-12-08 DIAGNOSIS — J189 Pneumonia, unspecified organism: Secondary | ICD-10-CM | POA: Diagnosis not present

## 2020-12-08 DIAGNOSIS — G4733 Obstructive sleep apnea (adult) (pediatric): Secondary | ICD-10-CM | POA: Diagnosis present

## 2020-12-08 DIAGNOSIS — Z825 Family history of asthma and other chronic lower respiratory diseases: Secondary | ICD-10-CM

## 2020-12-08 DIAGNOSIS — E785 Hyperlipidemia, unspecified: Secondary | ICD-10-CM | POA: Diagnosis present

## 2020-12-08 DIAGNOSIS — R6521 Severe sepsis with septic shock: Secondary | ICD-10-CM | POA: Diagnosis present

## 2020-12-08 DIAGNOSIS — Z66 Do not resuscitate: Secondary | ICD-10-CM | POA: Diagnosis not present

## 2020-12-08 DIAGNOSIS — R062 Wheezing: Secondary | ICD-10-CM | POA: Diagnosis not present

## 2020-12-08 DIAGNOSIS — R0602 Shortness of breath: Secondary | ICD-10-CM | POA: Diagnosis not present

## 2020-12-08 DIAGNOSIS — I110 Hypertensive heart disease with heart failure: Secondary | ICD-10-CM | POA: Diagnosis present

## 2020-12-08 DIAGNOSIS — J9602 Acute respiratory failure with hypercapnia: Secondary | ICD-10-CM | POA: Diagnosis not present

## 2020-12-08 DIAGNOSIS — G9341 Metabolic encephalopathy: Secondary | ICD-10-CM | POA: Diagnosis not present

## 2020-12-08 DIAGNOSIS — A4102 Sepsis due to Methicillin resistant Staphylococcus aureus: Secondary | ICD-10-CM | POA: Diagnosis not present

## 2020-12-08 DIAGNOSIS — R404 Transient alteration of awareness: Secondary | ICD-10-CM | POA: Diagnosis not present

## 2020-12-08 DIAGNOSIS — E870 Hyperosmolality and hypernatremia: Secondary | ICD-10-CM | POA: Diagnosis not present

## 2020-12-08 DIAGNOSIS — I5032 Chronic diastolic (congestive) heart failure: Secondary | ICD-10-CM | POA: Diagnosis not present

## 2020-12-08 DIAGNOSIS — J9601 Acute respiratory failure with hypoxia: Secondary | ICD-10-CM | POA: Diagnosis not present

## 2020-12-08 DIAGNOSIS — J441 Chronic obstructive pulmonary disease with (acute) exacerbation: Secondary | ICD-10-CM

## 2020-12-08 DIAGNOSIS — Z79899 Other long term (current) drug therapy: Secondary | ICD-10-CM

## 2020-12-08 DIAGNOSIS — R6889 Other general symptoms and signs: Secondary | ICD-10-CM | POA: Diagnosis not present

## 2020-12-08 DIAGNOSIS — N390 Urinary tract infection, site not specified: Secondary | ICD-10-CM | POA: Diagnosis not present

## 2020-12-08 DIAGNOSIS — Z818 Family history of other mental and behavioral disorders: Secondary | ICD-10-CM

## 2020-12-08 DIAGNOSIS — R000 Tachycardia, unspecified: Secondary | ICD-10-CM | POA: Diagnosis not present

## 2020-12-08 DIAGNOSIS — I5031 Acute diastolic (congestive) heart failure: Secondary | ICD-10-CM | POA: Diagnosis not present

## 2020-12-08 DIAGNOSIS — J151 Pneumonia due to Pseudomonas: Secondary | ICD-10-CM | POA: Diagnosis present

## 2020-12-08 DIAGNOSIS — T380X5A Adverse effect of glucocorticoids and synthetic analogues, initial encounter: Secondary | ICD-10-CM | POA: Diagnosis not present

## 2020-12-08 DIAGNOSIS — R0902 Hypoxemia: Secondary | ICD-10-CM

## 2020-12-08 DIAGNOSIS — K219 Gastro-esophageal reflux disease without esophagitis: Secondary | ICD-10-CM | POA: Diagnosis present

## 2020-12-08 DIAGNOSIS — Z955 Presence of coronary angioplasty implant and graft: Secondary | ICD-10-CM

## 2020-12-08 DIAGNOSIS — Z7902 Long term (current) use of antithrombotics/antiplatelets: Secondary | ICD-10-CM

## 2020-12-08 DIAGNOSIS — I9581 Postprocedural hypotension: Secondary | ICD-10-CM | POA: Diagnosis not present

## 2020-12-08 DIAGNOSIS — Z7951 Long term (current) use of inhaled steroids: Secondary | ICD-10-CM

## 2020-12-08 DIAGNOSIS — R918 Other nonspecific abnormal finding of lung field: Secondary | ICD-10-CM | POA: Diagnosis not present

## 2020-12-08 DIAGNOSIS — J439 Emphysema, unspecified: Secondary | ICD-10-CM | POA: Diagnosis present

## 2020-12-08 DIAGNOSIS — E1165 Type 2 diabetes mellitus with hyperglycemia: Secondary | ICD-10-CM | POA: Diagnosis not present

## 2020-12-08 DIAGNOSIS — Z9981 Dependence on supplemental oxygen: Secondary | ICD-10-CM

## 2020-12-08 DIAGNOSIS — E874 Mixed disorder of acid-base balance: Secondary | ICD-10-CM | POA: Diagnosis not present

## 2020-12-08 DIAGNOSIS — Z4682 Encounter for fitting and adjustment of non-vascular catheter: Secondary | ICD-10-CM | POA: Diagnosis not present

## 2020-12-08 DIAGNOSIS — I700 Atherosclerosis of aorta: Secondary | ICD-10-CM | POA: Diagnosis not present

## 2020-12-08 DIAGNOSIS — Z96643 Presence of artificial hip joint, bilateral: Secondary | ICD-10-CM | POA: Diagnosis present

## 2020-12-08 DIAGNOSIS — R339 Retention of urine, unspecified: Secondary | ICD-10-CM | POA: Diagnosis not present

## 2020-12-08 DIAGNOSIS — J90 Pleural effusion, not elsewhere classified: Secondary | ICD-10-CM | POA: Diagnosis not present

## 2020-12-08 DIAGNOSIS — Z743 Need for continuous supervision: Secondary | ICD-10-CM | POA: Diagnosis not present

## 2020-12-08 DIAGNOSIS — A419 Sepsis, unspecified organism: Secondary | ICD-10-CM

## 2020-12-08 LAB — LACTIC ACID, PLASMA: Lactic Acid, Venous: 2.8 mmol/L (ref 0.5–1.9)

## 2020-12-08 LAB — URINALYSIS, ROUTINE W REFLEX MICROSCOPIC
Bacteria, UA: NONE SEEN
Bilirubin Urine: NEGATIVE
Glucose, UA: >=500 mg/dL — AB
Ketones, ur: NEGATIVE mg/dL
Leukocytes,Ua: NEGATIVE
Nitrite: NEGATIVE
Protein, ur: >=300 mg/dL — AB
Specific Gravity, Urine: 1.016 (ref 1.005–1.030)
pH: 5 (ref 5.0–8.0)

## 2020-12-08 LAB — RESP PANEL BY RT-PCR (FLU A&B, COVID) ARPGX2
Influenza A by PCR: NEGATIVE
Influenza B by PCR: NEGATIVE
SARS Coronavirus 2 by RT PCR: NEGATIVE

## 2020-12-08 LAB — CBC WITH DIFFERENTIAL/PLATELET
Abs Immature Granulocytes: 0.33 10*3/uL — ABNORMAL HIGH (ref 0.00–0.07)
Basophils Absolute: 0.1 10*3/uL (ref 0.0–0.1)
Basophils Relative: 1 %
Eosinophils Absolute: 0 10*3/uL (ref 0.0–0.5)
Eosinophils Relative: 0 %
HCT: 52.4 % — ABNORMAL HIGH (ref 39.0–52.0)
Hemoglobin: 16.7 g/dL (ref 13.0–17.0)
Immature Granulocytes: 3 %
Lymphocytes Relative: 16 %
Lymphs Abs: 2.1 10*3/uL (ref 0.7–4.0)
MCH: 27.5 pg (ref 26.0–34.0)
MCHC: 31.9 g/dL (ref 30.0–36.0)
MCV: 86.3 fL (ref 80.0–100.0)
Monocytes Absolute: 0.9 10*3/uL (ref 0.1–1.0)
Monocytes Relative: 7 %
Neutro Abs: 9.8 10*3/uL — ABNORMAL HIGH (ref 1.7–7.7)
Neutrophils Relative %: 73 %
Platelets: 354 10*3/uL (ref 150–400)
RBC: 6.07 MIL/uL — ABNORMAL HIGH (ref 4.22–5.81)
RDW: 19 % — ABNORMAL HIGH (ref 11.5–15.5)
WBC: 13.1 10*3/uL — ABNORMAL HIGH (ref 4.0–10.5)
nRBC: 0.2 % (ref 0.0–0.2)

## 2020-12-08 LAB — I-STAT ARTERIAL BLOOD GAS, ED
Acid-Base Excess: 3 mmol/L — ABNORMAL HIGH (ref 0.0–2.0)
Bicarbonate: 31.6 mmol/L — ABNORMAL HIGH (ref 20.0–28.0)
Calcium, Ion: 1.29 mmol/L (ref 1.15–1.40)
HCT: 43 % (ref 39.0–52.0)
Hemoglobin: 14.6 g/dL (ref 13.0–17.0)
O2 Saturation: 100 %
Patient temperature: 97.7
Potassium: 4.1 mmol/L (ref 3.5–5.1)
Sodium: 144 mmol/L (ref 135–145)
TCO2: 34 mmol/L — ABNORMAL HIGH (ref 22–32)
pCO2 arterial: 64.6 mmHg — ABNORMAL HIGH (ref 32.0–48.0)
pH, Arterial: 7.294 — ABNORMAL LOW (ref 7.350–7.450)
pO2, Arterial: 372 mmHg — ABNORMAL HIGH (ref 83.0–108.0)

## 2020-12-08 LAB — PROTIME-INR
INR: 1.2 (ref 0.8–1.2)
Prothrombin Time: 14.9 seconds (ref 11.4–15.2)

## 2020-12-08 LAB — CBG MONITORING, ED: Glucose-Capillary: 336 mg/dL — ABNORMAL HIGH (ref 70–99)

## 2020-12-08 LAB — APTT: aPTT: 28 seconds (ref 24–36)

## 2020-12-08 MED ORDER — LACTATED RINGERS IV BOLUS (SEPSIS): 1000.0000 mL | Freq: Once | INTRAVENOUS | Status: DC

## 2020-12-08 MED ORDER — ENOXAPARIN SODIUM 40 MG/0.4ML IJ SOSY
40.0000 mg | PREFILLED_SYRINGE | INTRAMUSCULAR | Status: DC
  Administered 2020-12-09 – 2020-12-14 (×6): 40 mg via SUBCUTANEOUS
  Filled 2020-12-08 (×5): qty 0.4

## 2020-12-08 MED ORDER — METHYLPREDNISOLONE SODIUM SUCC 40 MG IJ SOLR
40.0000 mg | Freq: Two times a day (BID) | INTRAMUSCULAR | Status: DC
  Administered 2020-12-09: 40 mg via INTRAVENOUS
  Filled 2020-12-08 (×3): qty 1

## 2020-12-08 MED ORDER — LACTATED RINGERS IV SOLN: INTRAVENOUS | Status: AC

## 2020-12-08 MED ORDER — POLYETHYLENE GLYCOL 3350 17 G PO PACK
17.0000 g | PACK | Freq: Every day | ORAL | Status: DC | PRN
  Administered 2020-12-13: 17 g via ORAL
  Filled 2020-12-08 (×2): qty 1

## 2020-12-08 MED ORDER — ROCURONIUM BROMIDE 50 MG/5ML IV SOLN
INTRAVENOUS | Status: AC | PRN
  Administered 2020-12-08: 70 mg via INTRAVENOUS

## 2020-12-08 MED ORDER — LACTATED RINGERS IV BOLUS (SEPSIS)
1000.0000 mL | Freq: Once | INTRAVENOUS | Status: AC
  Administered 2020-12-08: 1000 mL via INTRAVENOUS

## 2020-12-08 MED ORDER — LACTATED RINGERS IV BOLUS: 1000.0000 mL | Freq: Once | INTRAVENOUS | Status: DC

## 2020-12-08 MED ORDER — PROPOFOL 1000 MG/100ML IV EMUL
5.0000 ug/kg/min | INTRAVENOUS | Status: DC
  Administered 2020-12-09: 10 ug/kg/min via INTRAVENOUS
  Administered 2020-12-09: 18 ug/kg/min via INTRAVENOUS
  Administered 2020-12-10: 25 ug/kg/min via INTRAVENOUS
  Administered 2020-12-10: 32 ug/kg/min via INTRAVENOUS
  Administered 2020-12-10: 22 ug/kg/min via INTRAVENOUS
  Administered 2020-12-10: 25 ug/kg/min via INTRAVENOUS
  Administered 2020-12-11: 32 ug/kg/min via INTRAVENOUS
  Administered 2020-12-11 (×2): 24 ug/kg/min via INTRAVENOUS
  Administered 2020-12-12 (×2): 20 ug/kg/min via INTRAVENOUS
  Administered 2020-12-12: 24 ug/kg/min via INTRAVENOUS
  Administered 2020-12-13 – 2020-12-15 (×8): 30 ug/kg/min via INTRAVENOUS
  Administered 2020-12-15: 25 ug/kg/min via INTRAVENOUS
  Administered 2020-12-16: 37 ug/kg/min via INTRAVENOUS
  Administered 2020-12-16 (×3): 30 ug/kg/min via INTRAVENOUS
  Administered 2020-12-17 (×2): 35 ug/kg/min via INTRAVENOUS
  Filled 2020-12-08 (×6): qty 100
  Filled 2020-12-08: qty 200
  Filled 2020-12-08 (×3): qty 100
  Filled 2020-12-08: qty 200
  Filled 2020-12-08 (×9): qty 100

## 2020-12-08 MED ORDER — MIDAZOLAM HCL 2 MG/2ML IJ SOLN: 2.0000 mg | Freq: Once | INTRAMUSCULAR | Status: AC

## 2020-12-08 MED ORDER — FENTANYL BOLUS VIA INFUSION
25.0000 ug | INTRAVENOUS | Status: DC | PRN
Start: 2020-12-08 — End: 2020-12-17
  Administered 2020-12-09 – 2020-12-17 (×12): 25 ug via INTRAVENOUS
  Filled 2020-12-08: qty 25

## 2020-12-08 MED ORDER — SODIUM CHLORIDE 0.9 % IV BOLUS
1000.0000 mL | Freq: Once | INTRAVENOUS | Status: AC
  Administered 2020-12-08: 1000 mL via INTRAVENOUS

## 2020-12-08 MED ORDER — SODIUM CHLORIDE 0.9 % IV SOLN: 250.0000 mL | INTRAVENOUS | Status: DC

## 2020-12-08 MED ORDER — INSULIN ASPART 100 UNIT/ML IJ SOLN
0.0000 [IU] | INTRAMUSCULAR | Status: DC
  Administered 2020-12-09: 7 [IU] via SUBCUTANEOUS
  Administered 2020-12-09: 3 [IU] via SUBCUTANEOUS
  Administered 2020-12-09: 7 [IU] via SUBCUTANEOUS
  Administered 2020-12-09: 4 [IU] via SUBCUTANEOUS
  Administered 2020-12-09: 7 [IU] via SUBCUTANEOUS
  Administered 2020-12-09 – 2020-12-10 (×7): 4 [IU] via SUBCUTANEOUS
  Administered 2020-12-11: 7 [IU] via SUBCUTANEOUS
  Administered 2020-12-11 (×4): 4 [IU] via SUBCUTANEOUS
  Administered 2020-12-11: 3 [IU] via SUBCUTANEOUS
  Administered 2020-12-11: 4 [IU] via SUBCUTANEOUS
  Administered 2020-12-12: 7 [IU] via SUBCUTANEOUS
  Administered 2020-12-12: 4 [IU] via SUBCUTANEOUS
  Administered 2020-12-12 (×2): 7 [IU] via SUBCUTANEOUS
  Administered 2020-12-12: 4 [IU] via SUBCUTANEOUS
  Administered 2020-12-13 (×3): 7 [IU] via SUBCUTANEOUS
  Administered 2020-12-13: 4 [IU] via SUBCUTANEOUS
  Administered 2020-12-13: 3 [IU] via SUBCUTANEOUS
  Administered 2020-12-13: 7 [IU] via SUBCUTANEOUS
  Administered 2020-12-13: 4 [IU] via SUBCUTANEOUS
  Administered 2020-12-14 (×4): 7 [IU] via SUBCUTANEOUS
  Administered 2020-12-14: 11 [IU] via SUBCUTANEOUS
  Administered 2020-12-14: 4 [IU] via SUBCUTANEOUS
  Administered 2020-12-15 (×4): 7 [IU] via SUBCUTANEOUS
  Administered 2020-12-15 – 2020-12-16 (×2): 11 [IU] via SUBCUTANEOUS
  Administered 2020-12-16 (×2): 4 [IU] via SUBCUTANEOUS
  Administered 2020-12-16: 3 [IU] via SUBCUTANEOUS
  Administered 2020-12-16: 4 [IU] via SUBCUTANEOUS
  Administered 2020-12-16: 7 [IU] via SUBCUTANEOUS
  Administered 2020-12-16: 11 [IU] via SUBCUTANEOUS
  Administered 2020-12-17: 4 [IU] via SUBCUTANEOUS
  Administered 2020-12-17: 7 [IU] via SUBCUTANEOUS

## 2020-12-08 MED ORDER — NOREPINEPHRINE 4 MG/250ML-% IV SOLN
2.0000 ug/min | INTRAVENOUS | Status: DC
  Administered 2020-12-08: 5 ug/min via INTRAVENOUS
  Administered 2020-12-09: 14 ug/min via INTRAVENOUS
  Filled 2020-12-08 (×2): qty 250

## 2020-12-08 MED ORDER — ETOMIDATE 2 MG/ML IV SOLN
INTRAVENOUS | Status: AC | PRN
  Administered 2020-12-08: 20 mg via INTRAVENOUS

## 2020-12-08 MED ORDER — SODIUM CHLORIDE 0.9 % IV SOLN
2.0000 g | INTRAVENOUS | Status: DC
  Administered 2020-12-08 – 2020-12-10 (×3): 2 g via INTRAVENOUS
  Filled 2020-12-08 (×3): qty 20

## 2020-12-08 MED ORDER — NOREPINEPHRINE 4 MG/250ML-% IV SOLN
INTRAVENOUS | Status: DC | PRN
  Administered 2020-12-08: 5 ug/min via INTRAVENOUS

## 2020-12-08 MED ORDER — LACTATED RINGERS IV BOLUS (SEPSIS): 250.0000 mL | Freq: Once | INTRAVENOUS | Status: DC

## 2020-12-08 MED ORDER — PANTOPRAZOLE SODIUM 40 MG IV SOLR
40.0000 mg | Freq: Every day | INTRAVENOUS | Status: DC
  Administered 2020-12-09: 40 mg via INTRAVENOUS
  Filled 2020-12-08: qty 40

## 2020-12-08 MED ORDER — FENTANYL 2500MCG IN NS 250ML (10MCG/ML) PREMIX INFUSION
25.0000 ug/h | INTRAVENOUS | Status: DC
  Administered 2020-12-08: 25 ug/h via INTRAVENOUS
  Administered 2020-12-09: 350 ug/h via INTRAVENOUS
  Administered 2020-12-09: 225 ug/h via INTRAVENOUS
  Administered 2020-12-09: 350 ug/h via INTRAVENOUS
  Administered 2020-12-10: 225 ug/h via INTRAVENOUS
  Administered 2020-12-10: 140 ug/h via INTRAVENOUS
  Administered 2020-12-11: 200 ug/h via INTRAVENOUS
  Administered 2020-12-12: 175 ug/h via INTRAVENOUS
  Administered 2020-12-12: 170 ug/h via INTRAVENOUS
  Administered 2020-12-13: 200 ug/h via INTRAVENOUS
  Administered 2020-12-13: 175 ug/h via INTRAVENOUS
  Administered 2020-12-14 (×2): 200 ug/h via INTRAVENOUS
  Administered 2020-12-15: 175 ug/h via INTRAVENOUS
  Administered 2020-12-16: 200 ug/h via INTRAVENOUS
  Administered 2020-12-16: 175 ug/h via INTRAVENOUS
  Administered 2020-12-17: 150 ug/h via INTRAVENOUS
  Filled 2020-12-08 (×17): qty 250

## 2020-12-08 MED ORDER — FENTANYL CITRATE (PF) 100 MCG/2ML IJ SOLN
INTRAMUSCULAR | Status: AC
  Administered 2020-12-08: 100 ug via INTRAVENOUS
  Filled 2020-12-08: qty 2

## 2020-12-08 MED ORDER — DOCUSATE SODIUM 100 MG PO CAPS
100.0000 mg | ORAL_CAPSULE | Freq: Two times a day (BID) | ORAL | Status: DC | PRN
  Administered 2020-12-13: 100 mg via ORAL
  Filled 2020-12-08: qty 1

## 2020-12-08 MED ORDER — FENTANYL CITRATE (PF) 100 MCG/2ML IJ SOLN: 100.0000 ug | Freq: Once | INTRAMUSCULAR | Status: AC

## 2020-12-08 MED ORDER — SODIUM CHLORIDE 0.9 % IV SOLN
500.0000 mg | INTRAVENOUS | Status: DC
  Administered 2020-12-08 – 2020-12-10 (×3): 500 mg via INTRAVENOUS
  Filled 2020-12-08 (×4): qty 500

## 2020-12-08 MED ORDER — MIDAZOLAM HCL 2 MG/2ML IJ SOLN
INTRAMUSCULAR | Status: AC
  Administered 2020-12-08: 2 mg via INTRAMUSCULAR
  Filled 2020-12-08: qty 2

## 2020-12-08 NOTE — Progress Notes (Signed)
CODE SEPSIS tracking by eLINK 

## 2020-12-08 NOTE — Telephone Encounter (Signed)
Should go to ER at this point, nothing to offer over the phone and clinic is closing and will need likely xrays to sort out why 02 so low

## 2020-12-08 NOTE — Sedation Documentation (Signed)
7 1/2 ETT, 23 at the lip. EBBS, +color. Intubation by Julieanne Manson MD

## 2020-12-08 NOTE — H&P (Addendum)
NAME:  [name], MRN:  [id number], DOB:  06/07/1950, LOS: 0 ADMISSION DATE:  12/06/2020 CONSULTATION DATE:  11/30/2020 REFERRING MD:  Lavenia Atlas CHIEF COMPLAINT:  SOB, hypercapnic/hypoxic respiratory failure   History of Present Illness:  70 year old man with PMHx significant for HTN, HLD, CAD (4-vessel), OSA (on CPAP), COPD (GOLD stage III, on 4LNC home O2) who presented to [facility] ED 7/25 via EMS for SOB.   At home, hypoxic on [employer] with sats 70%. Transitioned to NRB then CPAP. DuoNeb and Solumedrol administered in the field. Initially GCS 14, quickly declining to GCS 3 in ED. Patient subsequently intubated in ED for hypoxia and AMS. ABG showed pH 7.294/pCO2 64.6/pO2 372/Bicarb 31.6. Patient became progressively hypotensive post-intubation requiring Levophed initiation and CVL placement by ED.  PCCM consulted for admission.  Pertinent Medical History:   Past Medical History:  Diagnosis Date   Anxiety    Arthritis    COPD (chronic obstructive pulmonary disease) ([place])    Coronary artery disease    x4   Depression    Diabetes mellitus without complication ([place])    Gastritis    6/23-11/15/2019 gastric outlet obstruction suggested on KUB.  EGD revealed gastritis and esophagitis   GERD (gastroesophageal reflux disease)    tums will relieve   HLD (hyperlipidemia)    HTN (hypertension)    Obstructive sleep apnea    sleep study 2006, uses CPAP, pt does not know cpap settings   On home oxygen therapy    2L via nasal cannula prn   Shortness of breath    exertion   Significant Hospital Events: Including procedures, antibiotic start and stop dates in addition to other pertinent events   7/25 - BIB EMS for SOB, hypoxic/hypercapnic respiratory failure. Initially maintained on CPAP, became progressively altered (GCS 3) requiring intubation. PCCM consulted for admission. CVL placed by EDP.  Interim History / Subjective:  Consulted for admission  Objective:  Blood pressure 101/72,  pulse 87, temperature (!) 97.3 F (36.3 C), resp. rate 19, SpO2 96 %.    Vent Mode: PRVC FiO2 (%):  [60 %-100 %] 60 % Set Rate:  [18 bmp-22 bmp] 22 bmp Vt Set:  [600 mL] 600 mL PEEP:  [8 cmH20] 8 cmH20 Plateau Pressure:  [18 cmH20] 18 cmH20   Intake/Output Summary (Last 24 hours) at 11/23/2020 2236 Last data filed at 12/13/2020 2148 Gross per 24 hour  Intake 1000 ml  Output --  Net 1000 ml   There were no vitals filed for this visit.  Physical Examination: General: Chronically ill-appearing elderly gentleman in NAD. HEENT: [place]/AT, anicteric sclera, PERRL, dry mucous membranes. Neuro: Sedated. Does not respond to verbal, tactile or noxious stimuli. Not following commands.  CV: RRR, no m/g/r. PULM: Breathing even and unlabored on vent (PEEP 8, FiO2 50%). Lung fields coarse bilaterally, diminished R > L. GI: Obese, soft, nontender, nondistended. Normoactive bowel sounds. Extremities: Bilateral symmetric LE edema noted. Skin: Warm, very dry with scaling to BLE.  Resolved Hospital Problem List    Assessment & Plan:  Acute-on-chronic hypoxemic/hypercapnic respiratory failure in the setting of COPD History of COPD, GOLD stage III on 4L [place] baseline History of COPD (followed by Dr. Melvyn Novas - LBP). BIB EMS for SOB, hypoxia (70s on 7LNC). Baseline 4LNC. Required NRB -> CPAP en route with declining mental status. GCS declined from 14 to 3 en route. Intubated in ED for ongoing hypoxia/AMS. Initial gas showing hypercapnia. - Admit to ICU - Continue full vent support (4-8cc/kg IBW) -  Wean FiO2 for O2 sat > 90% - Daily WUA/SBT - VAP bundle - Pulmonary hygiene - Continue bronchodilators - Solumedrol '40mg'$  BID - PAD protocol for sedation: Propofol and Fentanyl for goal RASS 0 to -1  Right lobar pneumonia CXR demonstrating R airspace opacity superimposed on underlying hyperinflation/COPD. - Ceftriaxone, Azithromycin - Follow CXR  Concern for sepsis Hypotension, likely secondary to med effect  post-intubation - Goal MAP > 65 - Continue Levophed, titrated to MAP goal - Additional 1L LR bolus now - Continue IVF resuscitation - Trend CBC, fever curve - F/u BCx/UCx  History of HTN - Hold home antihypertensives at present  Type 2 diabetes mellitus - CBG Q4H - SSI  Best Practice: (right click and "Reselect all SmartList Selections" daily)   Diet/type: NPO DVT prophylaxis: LMWH GI prophylaxis: PPI Lines: Central line Foley:  Yes, and it is still needed Code Status:  full code Last date of multidisciplinary goals of care discussion [Pending]  Labs:  CBC: Recent Labs  Lab 12/07/2020 2142 11/20/2020 2205  WBC 13.1*  --   NEUTROABS 9.8*  --   HGB 16.7 14.6  HCT 52.4* 43.0  MCV 86.3  --   PLT 354  --    Basic Metabolic Panel: Recent Labs  Lab 12/05/2020 2205  NA 144  K 4.1   GFR: CrCl cannot be calculated (Patient's most recent lab result is older than the maximum 21 days allowed.). Recent Labs  Lab 12/01/2020 2142  WBC 13.1*   Liver Function Tests: No results for input(s): AST, ALT, ALKPHOS, BILITOT, PROT, ALBUMIN in the last 168 hours. No results for input(s): LIPASE, AMYLASE in the last 168 hours. No results for input(s): AMMONIA in the last 168 hours.  ABG:    Component Value Date/Time   PHART 7.294 (L) 11/17/2020 2205   PCO2ART 64.6 (H) 12/13/2020 2205   PO2ART 372 (H) 12/12/2020 2205   HCO3 31.6 (H) 12/11/2020 2205   TCO2 34 (H) 11/19/2020 2205   ACIDBASEDEF 3.0 (H) 06/22/2008 1551   O2SAT 100.0 11/27/2020 2205    Coagulation Profile: Recent Labs  Lab 11/14/2020 2142  INR 1.2   Cardiac Enzymes: No results for input(s): CKTOTAL, CKMB, CKMBINDEX, TROPONINI in the last 168 hours.  HbA1C: Hemoglobin A1C  Date/Time Value Ref Range Status  11/20/2019 12:00 AM 5.8  Final   Hgb A1c MFr Bld  Date/Time Value Ref Range Status  11/07/2019 11:33 AM 6.1 (H) 4.8 - 5.6 % Final    Comment:    (NOTE) Pre diabetes:          5.7%-6.4%  Diabetes:               >6.4%  Glycemic control for   <7.0% adults with diabetes   06/22/2008 03:35 PM  4.6 - 6.1 % Final   5.7 (NOTE)   The ADA recommends the following therapeutic goal for glycemic   control related to Hgb A1C measurement:   Goal of Therapy:   < 7.0% Hgb A1C   Reference: American Diabetes Association: Clinical Practice   Recommendations 2008, Diabetes Care,  2008, 31:(Suppl 1).   CBG: Recent Labs  Lab 11/18/2020 2111  GLUCAP 336*   Review of Systems:   Patient is encephalopathic and/or intubated. Therefore history has been obtained from chart review.   Past Medical History:  He,  has a past medical history of Anxiety, Arthritis, COPD (chronic obstructive pulmonary disease) ([place]), Coronary artery disease, Depression, Diabetes mellitus without complication ([place]), Gastritis, GERD (gastroesophageal reflux disease),  HLD (hyperlipidemia), HTN (hypertension), Obstructive sleep apnea, On home oxygen therapy, and Shortness of breath.   Surgical History:   Past Surgical History:  Procedure Laterality Date   CARDIAC CATHETERIZATION  2003,2010   x4    COLONOSCOPY WITH PROPOFOL N/A 08/30/2014   Procedure: COLONOSCOPY WITH PROPOFOL;  Surgeon: Carol Ada, MD;  Location: [facility] END[facility];  Service: Endoscopy;  Laterality: N/A;  h&p in file    COLONOSCOPY WITH PROPOFOL N/A 07/08/2017   Procedure: COLONOSCOPY WITH PROPOFOL;  Surgeon: Carol Ada, MD;  Location: [facility] ENDOSCOPY;  Service: Endoscopy;  Laterality: N/A;   CORONARY ANGIOPLASTY     stents x 4   DENTAL SURGERY     ESOPHAGOGASTRODUODENOSCOPY (EGD) WITH PROPOFOL N/A 11/12/2019   Procedure: ESOPHAGOGASTRODUODENOSCOPY (EGD) WITH PROPOFOL;  Surgeon: Carol Ada, MD;  Location: [place];  Service: Endoscopy;  Laterality: N/A;   TOTAL HIP ARTHROPLASTY  04/06/2012   Procedure: TOTAL HIP ARTHROPLASTY;  Surgeon: Sharmon Revere, MD;  Location: [place];  Service: Orthopedics;  Laterality: Right;   TOTAL HIP ARTHROPLASTY Left 04/03/2013   Procedure:  LEFT TOTAL HIP ARTHROPLASTY;  Surgeon: Sharmon Revere, MD;  Location: [name];  Service: Orthopedics;  Laterality: Left;    Social History:   reports that he has been smoking cigarettes and e-cigarettes. He has a 11.25 pack-year smoking history. He has never used smokeless tobacco. He reports previous drug use. Drug: Marijuana. He reports that he does not drink alcohol.   Family History:  His family history includes Depression in his mother; Emphysema in his father; Heart disease in his father.   Allergies: No Known Allergies   Home Medications: Prior to Admission medications   Medication Sig Start Date End Date Taking? Authorizing Provider  acetaminophen (TYLENOL) 325 MG tablet Take 650 mg by mouth every 6 (six) hours as needed.    [provider]  albuterol (PROAIR HFA) 108 (90 Base) MCG/ACT inhaler 2 puffs every 4 hours as needed only  if your can't catch your breath 11/29/19   Medina-Vargas, Monina C, NP  amLODipine (NORVASC) 5 MG tablet Take 1 tablet (5 mg total) by mouth at bedtime. 11/29/19   Medina-Vargas, Monina C, NP  aspirin EC 81 MG tablet Take 81 mg by mouth at bedtime.    [provider]  atenolol (TENORMIN) 25 MG tablet Take 1 tablet (25 mg total) by mouth at bedtime. 11/29/19   Medina-Vargas, Monina C, NP  azithromycin (ZITHROMAX) 250 MG tablet Zpack taper as directed 11/04/20   Martyn Ehrich, NP  budesonide-formoterol [facility]) 160-4.5 MCG/ACT inhaler Inhale 2 puffs into the lungs 2 (two) times daily. 11/04/20   Martyn Ehrich, NP  busPIRone (BUSPAR) 5 MG tablet Take 1 tablet (5 mg total) by mouth 2 (two) times daily. 06/23/20   Arfeen, Arlyce Harman, MD  clopidogrel (PLAVIX) 75 MG tablet Take 1 tablet (75 mg total) by mouth at bedtime. 11/29/19   Medina-Vargas, Monina C, NP  Coenzyme Q10 (COQ10) 100 MG CAPS Take 1 capsule by mouth at bedtime.     [provider]  feeding supplement, ENSURE ENLIVE, (ENSURE ENLIVE) LIQD Take 237 mLs by mouth daily.     [provider]  gabapentin (NEURONTIN) 100 MG capsule Take 1 capsule (100 mg total) by mouth 2 (two) times daily. 10/20/20 10/20/21  Arfeen, Arlyce Harman, MD  guaiFENesin (MUCINEX) 600 MG 12 hr tablet Take 600 mg by mouth 2 (two) times daily.    [provider]  meclizine (ANTIVERT) 25 MG tablet  Take 1 tablet (25 mg total) by mouth daily. 10/20/20   Arfeen, Arlyce Harman, MD  mirtazapine (REMERON) 45 MG tablet Take 1 tablet (45 mg total) by mouth at bedtime. 09/18/20   Arfeen, Arlyce Harman, MD  Multiple Vitamin (MULTIVITAMIN WITH MINERALS) TABS Take 1 tablet by mouth at bedtime.     [provider]  nitroGLYCERIN (NITROSTAT) 0.4 MG SL tablet Place 1 tablet (0.4 mg total) under the tongue every 5 (five) minutes as needed for chest pain. 11/29/19   Medina-Vargas, Monina C, NP  Nutritional Supplements (ENSURE CLEAR) LIQD Take 237 mLs by mouth daily.    [provider]  OXYGEN Inhale 4 L into the lungs continuous. With exertion only     [provider]  pravastatin (PRAVACHOL) 40 MG tablet Take 1 tablet (40 mg total) by mouth daily. Taking at bedtime 11/29/19   Medina-Vargas, Monina C, NP  predniSONE (DELTASONE) 10 MG tablet Take '10mg'$  tab daily (may increase to '20mg'$  during acute exacerbation) 11/04/20   Martyn Ehrich, NP  sennosides-docusate sodium (SENOKOT-S) 8.6-50 MG tablet Take 1 tablet by mouth daily. 11/29/19   Medina-Vargas, Monina C, NP  sodium chloride (OCEAN) 0.65 % SOLN nasal spray Place 2 sprays into both nostrils as needed for congestion.    [provider]  testosterone cypionate (DEPOTESTOSTERONE CYPIONATE) 200 MG/ML injection Inject 200 mg into the muscle every 14 (fourteen) days.    [provider]  Tiotropium Bromide Monohydrate (SPIRIVA RESPIMAT) 2.5 MCG/ACT AERS Inhale 2 puffs into the lungs daily. 11/04/20   Martyn Ehrich, NP  vitamin B-12 (CYANOCOBALAMIN) 1000 MCG tablet Take 1,000 mcg by mouth daily.     [provider]     Critical care time: 35 minutes   Lestine Mount, PA-C [facility] 12/05/2020 10:36 PM  Please see [email or web address] for pager details.  From 7A-7P if no response, please call [phone] After hours, please call ELink [phone]

## 2020-12-08 NOTE — Hospital Discharge Follow-Up (Addendum)
Critical care attending attestation note:  Patient seen and examined and relevant ancillary tests reviewed.  I agree with the assessment and plan of care as outlined by Lestine Mount, PA-C. The following reflects my independent critical care time.   Synopsis of assessment and plan: 70 year old male with chronic hypoxic respiratory failure due to end-stage COPD, hypertension and coronary artery disease who was brought into the emergency department with complaint of shortness of breath, found to be severely hypoxic and became obtunded in the emergency department requiring endotracheal intubation.  Post intubation patient became hypotensive requiring IV vasopressors   Physical exam: General: Critically ill-appearing male, orally intubated, looks unkept HEENT: [place]/AT, eyes anicteric.  ETT and OGT in place Neuro: Sedated, not following commands.  Eyes are closed.  Pupils 3 mm bilateral reactive to light Chest: Coarse breath sounds, no wheezes or rhonchi Heart: Regular rate and rhythm, no murmurs or gallops Abdomen: Soft, nontender, nondistended, bowel sounds present Skin: No rash, chronic stasis changes  Labs and images were reviewed  Assessment plan: Acute on chronic hypoxic/hypercapnic respiratory failure due to COPD exacerbation Acute respiratory acidosis Community-acquired pneumonia Septic shock Poorly controlled diabetes type 2 with hyperglycemia  Continue lung protective ventilation Peak and plateau pressures are at goal Vent setting was adjusted to help clear hypercapnia and respiratory acidosis Continue broad-spectrum antibiotics Continue DuoNeb and steroids Continue aggressive IV fluid resuscitation Patient is requiring Levophed at 15 mics at this time, titrate with map goal 65 Follow-up cultures Monitor fingerstick with goal 140-180 Continue sliding scale, if blood sugar remains elevated, consider insulin infusion   CRITICAL CARE Performed by: Jacky Kindle   Total  independent critical care time: 45 minutes  Critical care time was exclusive of separately billable procedures and treating other patients.  Critical care was necessary to treat or prevent imminent or life-threatening deterioration.   Critical care was time spent personally by me on the following activities: development of treatment plan with patient and/or surrogate as well as nursing, discussions with consultants, evaluation of patient's response to treatment, examination of patient, obtaining history from patient or surrogate, ordering and performing treatments and interventions, ordering and review of laboratory studies, ordering and review of radiographic studies, pulse oximetry, re-evaluation of patient's condition and participation in multidisciplinary rounds.  Jacky Kindle MD  Pulmonary Critical Care See Amion for pager If no response to pager, please call [phone] until 7pm After 7pm, Please call E-link [phone]  11/16/2020, 11:46 PM

## 2020-12-08 NOTE — Telephone Encounter (Signed)
Primary Pulmonologist: Wert Last office visit and with whom: 11/04/20 Geraldo Pitter NP What do we see them for (pulmonary problems): Chronic respiratory failure and hypoxia, COPD Gold III Last OV assessment/plan:  Assessment and Plan:   COPD GOLD III with acute exacerbation: - Productive cough with yellow mucus x 2 weeks - Continue chronic prednisone '10mg'$  daily (Ceiling '20mg'$  and floor '10mg'$ ) - Continue Symbicort 160 2 puffs BID and Spiriva - Continue Mucinex '1200mg'$  BID - RX: Zpack as directed, refills sent   Chronic respiratory failure: - Continues to benefit from supplemental oxygen, no new requirements - Maintained on 3.5-4L oxygen   Follow Up Instructions:    - 3 months with Dr. Melvyn Novas or APP virtual visit    I discussed the assessment and treatment plan with the patient. The patient was provided an opportunity to ask questions and all were answered. The patient agreed with the plan and demonstrated an understanding of the instructions.   The patient was advised to call back or seek an in-person evaluation if the symptoms worsen or if the condition fails to improve as anticipated.   I provided 25 minutes of non-face-to-face time during this encounter.     Martyn Ehrich, NP         Patient Instructions by Martyn Ehrich, NP at 11/04/2020 10:00 AM  Author: Martyn Ehrich, NP Author Type: Nurse Practitioner Filed: 11/04/2020 10:00 AM  Note Status: Signed Cosign: Cosign Not Required Encounter Date: 11/04/2020  Editor: Martyn Ehrich, NP (Nurse Practitioner)               No changes to maintenance regiment, refills sent to pharmacy          Instructions    Return in about 3 months (around 02/04/2021).  No changes to maintenance regiment, refills sent to pharmacy       Reason for call: States sats are 83% at 5L, only goes to 89% at the highest for 2 days.  He was wheezing 3 days ago, but that cleared up.  Sob for 1 day even sitting still.  Started  Gabapentin in February, but has not had any problems til now.  He has not had any change in dose since he started it.  Still using Symbicort and spiriva as prescribed. Taking 10 mg of prednisone, increased prednisone to 20 mg for past 3 days with no improvement.  Feels chest tightness for the past 2 days.   Has a cough with mucous that is clear to yellow for 5 days.  No sick contacts.  Has had covid vaccines and booster.  No covid test recently.  Last month he has been using 4-5 liters of oxygen.  He wants to know if he needs to go to the ER or if something can be prescribed to help with his symptoms.    Dr. Melvyn Novas, Please advise.  (examples of things to ask: : When did symptoms start? Fever? Cough? Productive? Color to sputum? More sputum than usual? Wheezing? Have you needed increased oxygen? Are you taking your respiratory medications? What over the counter measures have you tried?)  No Known Allergies  Immunization History  Administered Date(s) Administered   Influenza Split 02/14/2013, 03/06/2019   Influenza, High Dose Seasonal PF 02/10/2016, 03/16/2018   Influenza,inj,Quad PF,6+ Mos 01/28/2014   Influenza-Unspecified 01/16/2015, 03/17/2018   Janssen (J&J) SARS-COV-2 Vaccination 10/10/2019   Pneumococcal Conjugate-13 02/10/2016, 03/01/2017   Pneumococcal-Unspecified 05/17/2013   Zoster Recombinat (Shingrix) 09/16/2016, 02/19/2017

## 2020-12-08 NOTE — ED Triage Notes (Signed)
Pt arrived via [facility] for cc of shortness of breath. PMH of end stage  Pt baseline 4 LNC at baseline, at time of EMS arrival pt was on [facility] without relief, oxygen saturation 70%. Pt placed on NR, then CPAP. Pt received partial duo ned treatment and 125 solumed via 20g RH. Pt respond to touch/name, GCS 14 for decreased level of alertness. EMS report decline in pt mental status en route.   EMS vitals  HR 118 BP 160/112 SPO2 90 (CPAP)

## 2020-12-08 NOTE — ED Notes (Signed)
Pt now unresponsive, GCS 3, attending, RT, Pharm notified

## 2020-12-08 NOTE — Telephone Encounter (Signed)
Patient is aware of Dr. Gustavus Bryant recommendations.  Patient stated that he would present to ED now. Advised him to call EMS if spo2 drops into the 70's again.  Nothing further needed at this time.

## 2020-12-08 NOTE — ED Provider Notes (Signed)
[facility] EMERGENCY DEPARTMENT Provider Note   CSN: [id number] Arrival date & time: 12/05/2020  2056     History Chief Complaint  Patient presents with   Shortness of Breath    [name] is a 70 y.o. male.   Shortness of Breath  70 year old male PMHx COPD, chronic hypoxic respiratory failure, CAD, T2DM, HTN, HLD, OSA, brought by EMS for shortness of breath.  He is at baseline on 4 LPM via [place], but was found to be 70% on 7 LPM prompting initiation of NRB and subsequently CPAP.  He additionally received duo nebs x1 and 125 mg Solu-Medrol.  On arrival, he is saturating in the mid 80s on CPAP with inconsistent alertness.  Remainder of history limited by acuity.  Level 5 caveat applies in the context of critical illness.  History obtained from EMS and chart review.  Past Medical History:  Diagnosis Date   Anxiety    Arthritis    COPD (chronic obstructive pulmonary disease) ([place])    Coronary artery disease    x4   Depression    Diabetes mellitus without complication ([place])    Gastritis    6/23-11/15/2019 gastric outlet obstruction suggested on KUB.  EGD revealed gastritis and esophagitis   GERD (gastroesophageal reflux disease)    tums will relieve   HLD (hyperlipidemia)    HTN (hypertension)    Obstructive sleep apnea    sleep study 2006, uses CPAP, pt does not know cpap settings   On home oxygen therapy    2L via nasal cannula prn   Shortness of breath    exertion    Patient Active Problem List   Diagnosis Date Noted   Coordination of complex care 01/11/2020   Gastritis 11/20/2019   Neurocognitive deficits 11/20/2019   [place] 11/09/2019   Acute on chronic respiratory failure with hypoxia ([place]) 11/07/2019   DM (diabetes mellitus), type 2 with peripheral vascular complications ([place])    Coronary artery disease    HLD (hyperlipidemia)    HTN (hypertension)    Obstructive sleep apnea    Elevated prostate specific antigen (PSA) 02/01/2018   COPD  with acute exacerbation ([place]) 11/11/2015   Smoker 05/02/2013   COPD GOLD III with reversibility  05/01/2013   Chronic respiratory failure with hypoxia ([place]) 05/01/2013   Depression 06/30/2011    Past Surgical History:  Procedure Laterality Date   CARDIAC CATHETERIZATION  2003,2010   x4    COLONOSCOPY WITH PROPOFOL N/A 08/30/2014   Procedure: COLONOSCOPY WITH PROPOFOL;  Surgeon: Carol Ada, MD;  Location: [facility];  Service: Endoscopy;  Laterality: N/A;  h&p in file    COLONOSCOPY WITH PROPOFOL N/A 07/08/2017   Procedure: COLONOSCOPY WITH PROPOFOL;  Surgeon: Carol Ada, MD;  Location: [facility];  Service: Endoscopy;  Laterality: N/A;   CORONARY ANGIOPLASTY     stents x 4   DENTAL SURGERY     ESOPHAGOGASTRODUODENOSCOPY (EGD) WITH PROPOFOL N/A 11/12/2019   Procedure: ESOPHAGOGASTRODUODENOSCOPY (EGD) WITH PROPOFOL;  Surgeon: Carol Ada, MD;  Location: [facility];  Service: Endoscopy;  Laterality: N/A;   TOTAL HIP ARTHROPLASTY  04/06/2012   Procedure: TOTAL HIP ARTHROPLASTY;  Surgeon: Sharmon Revere, MD;  Location: [facility];  Service: Orthopedics;  Laterality: Right;   TOTAL HIP ARTHROPLASTY Left 04/03/2013   Procedure: LEFT TOTAL HIP ARTHROPLASTY;  Surgeon: Sharmon Revere, MD;  Location: [facility];  Service: Orthopedics;  Laterality: Left;       Family History  Problem Relation Age of Onset  Depression Mother    Heart disease Father    Emphysema Father        smoked    Social History   Tobacco Use   Smoking status: Every Day    Packs/day: 0.25    Years: 45.00    Pack years: 11.25    Types: Cigarettes, E-cigarettes   Smokeless tobacco: Never   Tobacco comments:    stopped smoking in 2018, but still using E-cigs  Vaping Use   Vaping Use: Every day  Substance Use Topics   Alcohol use: No    Alcohol/week: 0.0 standard drinks    Comment: Reports drinks a beer maybe 4 times a year.   Drug use: Not Currently    Types: Marijuana    Comment: stopped smoking  marijuana june/2021    Home Medications Prior to Admission medications   Medication Sig Start Date End Date Taking? Authorizing Provider  acetaminophen (TYLENOL) 325 MG tablet Take 650 mg by mouth every 6 (six) hours as needed.    [provider]  albuterol (PROAIR HFA) 108 (90 Base) MCG/ACT inhaler 2 puffs every 4 hours as needed only  if your can't catch your breath 11/29/19   Medina-Vargas, Monina C, NP  amLODipine (NORVASC) 5 MG tablet Take 1 tablet (5 mg total) by mouth at bedtime. 11/29/19   Medina-Vargas, Monina C, NP  aspirin EC 81 MG tablet Take 81 mg by mouth at bedtime.    [provider]  atenolol (TENORMIN) 25 MG tablet Take 1 tablet (25 mg total) by mouth at bedtime. 11/29/19   Medina-Vargas, Monina C, NP  azithromycin (ZITHROMAX) 250 MG tablet Zpack taper as directed 11/04/20   Martyn Ehrich, NP  budesonide-formoterol [facility]) 160-4.5 MCG/ACT inhaler Inhale 2 puffs into the lungs 2 (two) times daily. 11/04/20   Martyn Ehrich, NP  busPIRone (BUSPAR) 5 MG tablet Take 1 tablet (5 mg total) by mouth 2 (two) times daily. 06/23/20   Arfeen, Arlyce Harman, MD  clopidogrel (PLAVIX) 75 MG tablet Take 1 tablet (75 mg total) by mouth at bedtime. 11/29/19   Medina-Vargas, Monina C, NP  Coenzyme Q10 (COQ10) 100 MG CAPS Take 1 capsule by mouth at bedtime.     [provider]  feeding supplement, ENSURE ENLIVE, (ENSURE ENLIVE) LIQD Take 237 mLs by mouth daily.    [provider]  gabapentin (NEURONTIN) 100 MG capsule Take 1 capsule (100 mg total) by mouth 2 (two) times daily. 10/20/20 10/20/21  Arfeen, Arlyce Harman, MD  guaiFENesin (MUCINEX) 600 MG 12 hr tablet Take 600 mg by mouth 2 (two) times daily.    [provider]  meclizine (ANTIVERT) 25 MG tablet Take 1 tablet (25 mg total) by mouth daily. 10/20/20   Arfeen, Arlyce Harman, MD  mirtazapine (REMERON) 45 MG tablet Take 1 tablet (45 mg total) by mouth at bedtime. 09/18/20   Arfeen, Arlyce Harman, MD  Multiple Vitamin  (MULTIVITAMIN WITH MINERALS) TABS Take 1 tablet by mouth at bedtime.     [provider]  nitroGLYCERIN (NITROSTAT) 0.4 MG SL tablet Place 1 tablet (0.4 mg total) under the tongue every 5 (five) minutes as needed for chest pain. 11/29/19   Medina-Vargas, Monina C, NP  Nutritional Supplements (ENSURE CLEAR) LIQD Take 237 mLs by mouth daily.    [provider]  OXYGEN Inhale 4 L into the lungs continuous. With exertion only     [provider]  pravastatin (PRAVACHOL) 40 MG tablet Take 1 tablet (40 mg total)  by mouth daily. Taking at bedtime 11/29/19   Medina-Vargas, Monina C, NP  predniSONE (DELTASONE) 10 MG tablet Take '10mg'$  tab daily (may increase to '20mg'$  during acute exacerbation) 11/04/20   Martyn Ehrich, NP  sennosides-docusate sodium (SENOKOT-S) 8.6-50 MG tablet Take 1 tablet by mouth daily. 11/29/19   Medina-Vargas, Monina C, NP  sodium chloride (OCEAN) 0.65 % SOLN nasal spray Place 2 sprays into both nostrils as needed for congestion.    [provider]  testosterone cypionate (DEPOTESTOSTERONE CYPIONATE) 200 MG/ML injection Inject 200 mg into the muscle every 14 (fourteen) days.    [provider]  Tiotropium Bromide Monohydrate (SPIRIVA RESPIMAT) 2.5 MCG/ACT AERS Inhale 2 puffs into the lungs daily. 11/04/20   Martyn Ehrich, NP  vitamin B-12 (CYANOCOBALAMIN) 1000 MCG tablet Take 1,000 mcg by mouth daily.     [provider]    Allergies    Patient has no known allergies.  Review of Systems   Review of Systems  Unable to perform ROS: Acuity of condition  Respiratory:  Positive for shortness of breath.    Physical Exam Updated Vital Signs There were no vitals taken for this visit.  Physical Exam Vitals and nursing note reviewed.  Constitutional:      General: He is in acute distress.     Appearance: He is diaphoretic.  HENT:     Head: Normocephalic and atraumatic.     Mouth/Throat:     Mouth: Mucous membranes are moist.      Pharynx: Oropharynx is clear.  Eyes:     Extraocular Movements: Extraocular movements intact.     Pupils: Pupils are equal, round, and reactive to light.  Neck:     Trachea: No tracheal deviation.  Cardiovascular:     Rate and Rhythm: Regular rhythm. Tachycardia present.     Heart sounds: Murmur heard.    No friction rub. No gallop.  Pulmonary:     Effort: Respiratory distress present.     Breath sounds: Rales present. Wheezes: no obvious wheeze, but quite tight. Chest:     Chest wall: No deformity or edema.  Abdominal:     Palpations: Abdomen is soft.     Tenderness: There is no abdominal tenderness. There is no guarding or rebound.  Musculoskeletal:     Cervical back: Normal range of motion.     Right lower leg: No edema.     Left lower leg: No edema.     Comments: BLE dry, scaly  Skin:    General: Skin is warm.  Neurological:     Comments: Mental status: Disoriented, intermittently alert Speech: Untested CN II: visual fields grossly intact CN III/IV/VI: PERRL, EOMI CN V: Untested CN VII: no facial droop CN VIII: no nystagmus, audition grossly intact speech VN IX/X: Noted to be drooling, uncertain if tolerating secretions CN XI: trapezius and SCM motor function intact CN XII: midline tongue w/o atrophy or fasciculation Moving all 4 extremities spontaneously Coordination: Untested Gait: Untested     ED Results / Procedures / Treatments   Labs (all labs ordered are listed, but only abnormal results are displayed) Labs Reviewed - No data to display  EKG None  Radiology No results found.  Procedures Procedure Name: Intubation Date/Time: 11/29/2020 9:14 PM Performed by: Levin Bacon, MD Pre-anesthesia Checklist: Patient identified, Suction available and Patient being monitored Oxygen Delivery Method: Ambu bag Induction Type: Rapid sequence Ventilation: Mask ventilation with difficulty and Two handed mask ventilation required Laryngoscope Size:  Glidescope Grade View:  Grade I Tube size: 7.5 mm Number of attempts: 1 Airway Equipment and Method: Video-laryngoscopy and Rigid stylet Placement Confirmation: ETT inserted through vocal cords under direct vision, Positive ETCO2, CO2 detector and Breath sounds checked- equal and bilateral Secured at: 23 cm Tube secured with: ETT holder Future Recommendations: Recommend- induction with short-acting agent, and alternative techniques readily available   CENTRAL LINE Performed by: Kahley Leib Consent: The procedure was performed in an emergent situation. Required items: required blood products, implants, devices, and special equipment available Patient identity confirmed: arm band and provided demographic data Time out: Immediately prior to procedure a "time out" was called to verify the correct patient, procedure, equipment, support staff and site/side marked as required. Indications: vascular access Anesthesia: local infiltration Local anesthetic: lidocaine 1% with epinephrine Anesthetic total: 3 ml Patient sedated: no Preparation: skin prepped with 2% chlorhexidine Skin prep agent dried: skin prep agent completely dried prior to procedure Sterile barriers: all five maximum sterile barriers used - cap, mask, sterile gown, sterile gloves, and large sterile sheet Hand hygiene: hand hygiene performed prior to central venous catheter insertion  Location details: R IJ  Catheter type: triple lumen Catheter size: 8 Fr Pre-procedure: landmarks identified Ultrasound guidance: Yes Successful placement: yes Post-procedure: line sutured and dressing applied Assessment: blood return through all parts, free fluid flow, placement verified by x-ray and no pneumothorax on x-ray Patient tolerance: Patient tolerated the procedure well with no immediate complications.     Medications Ordered in ED Medications - No data to display  ED Course  I have reviewed the triage vital signs and  the nursing notes.  Pertinent labs & imaging results that were available during my care of the patient were reviewed by me and considered in my medical decision making (see chart for details).    MDM Rules/Calculators/A&P  This is a 70 year old male PMHx COPD, chronic hypoxic respiratory failure, CAD, T2DM, HTN, HLD, brought by EMS for desats down to the 70s despite increasing home O2 from baseline 4 LPM to 7 LPM.  EMS transition to NRB and then escalated to CPAP with some improvement.  Additionally administered Solu-Medrol and DuoNeb x1.  On arrival, patient intermittently responsive, getting progressively obtunded, not tolerating CPAP with saturations in the mid 80s, prompting endotracheal intubation as detailed above, and ngt placement.  Patient was transitioned to vent with PRVC settings as detailed in RT documentation.  Shortly after intubation, patient noted to become hypotensive prompting initiation of Levophed in addition to fluids with good response.  Bedside x-ray concerning for right-sided pneumonia, prompting initiation of sepsis protocol given his hypothermia, tachycardia, and respiratory failure.  Right IJ CVC was placed, as detailed above, due to limited access and need for multiple interventions.  All studies independently reviewed by myself, d/w the attending physician, factored into my MDM. -EKG: sinus tach 117, RVH changes, normal intervals, no acute st/t changes, essentially unchanged c/t prior from 11/13/2019 -XR's chest and abdomen: ett, ngt, and cvc appear to be appropriately placed on bedside review; lungs appear hyperinflated with right-sided opacities -Lactic acid: 2.8 -CBCd: WBC 13.1 -UA: Glucosuria and proteinuria -ABG: pH 7.294, PCO2 64.6, PO2 372, bicarb 31.6 -Unremarkable: PT/PTT, COVID/influenza PCR  Presentation appears most consistent with acute hypoxic hypercarbic respiratory failure in the context of decompensated COPD, likely in the context of  pneumonia.  ICU was consulted and agreed to accept the patient.  Plan was discussed with family understand and agree.  Patient hemodynamically unstable but responding well to pressors and fluids.  Subsequently  admitted.  Final Clinical Impression(s) / ED Diagnoses Final diagnoses:  None    Rx / DC Orders ED Discharge Orders     None        Levin Bacon, MD 12/09/20 0140    Lorelle Gibbs, DO 12/09/20 1939

## 2020-12-09 ENCOUNTER — Inpatient Hospital Stay (HOSPITAL_COMMUNITY): Payer: Medicare Other

## 2020-12-09 DIAGNOSIS — J441 Chronic obstructive pulmonary disease with (acute) exacerbation: Secondary | ICD-10-CM | POA: Diagnosis not present

## 2020-12-09 DIAGNOSIS — J9602 Acute respiratory failure with hypercapnia: Secondary | ICD-10-CM | POA: Diagnosis not present

## 2020-12-09 DIAGNOSIS — J9601 Acute respiratory failure with hypoxia: Secondary | ICD-10-CM | POA: Diagnosis not present

## 2020-12-09 LAB — GLUCOSE, CAPILLARY
Glucose-Capillary: 227 mg/dL — ABNORMAL HIGH (ref 70–99)
Glucose-Capillary: 247 mg/dL — ABNORMAL HIGH (ref 70–99)
Glucose-Capillary: 245 mg/dL — ABNORMAL HIGH (ref 70–99)
Glucose-Capillary: 196 mg/dL — ABNORMAL HIGH (ref 70–99)
Glucose-Capillary: 162 mg/dL — ABNORMAL HIGH (ref 70–99)
Glucose-Capillary: 127 mg/dL — ABNORMAL HIGH (ref 70–99)
Glucose-Capillary: 192 mg/dL — ABNORMAL HIGH (ref 70–99)

## 2020-12-09 LAB — COMPREHENSIVE METABOLIC PANEL
ALT: 29 U/L (ref 0–44)
AST: 92 U/L — ABNORMAL HIGH (ref 15–41)
Albumin: 2.5 g/dL — ABNORMAL LOW (ref 3.5–5.0)
Alkaline Phosphatase: 45 U/L (ref 38–126)
Anion gap: 8 (ref 5–15)
BUN: 13 mg/dL (ref 8–23)
CO2: 25 mmol/L (ref 22–32)
Calcium: 8.4 mg/dL — ABNORMAL LOW (ref 8.9–10.3)
Chloride: 105 mmol/L (ref 98–111)
Creatinine, Ser: 0.8 mg/dL (ref 0.61–1.24)
GFR, Estimated: >60 mL/min (ref >60)
Glucose, Bld: 258 mg/dL — ABNORMAL HIGH (ref 70–99)
Potassium: 6 mmol/L — ABNORMAL HIGH (ref 3.5–5.1)
Sodium: 138 mmol/L (ref 135–145)
Total Bilirubin: 2.4 mg/dL — ABNORMAL HIGH (ref 0.3–1.2)
Total Protein: 5.2 g/dL — ABNORMAL LOW (ref 6.5–8.1)

## 2020-12-09 LAB — TRIGLYCERIDES: Triglycerides: 79 mg/dL (ref <150)

## 2020-12-09 LAB — BASIC METABOLIC PANEL
Anion gap: 10 (ref 5–15)
BUN: 13 mg/dL (ref 8–23)
CO2: 31 mmol/L (ref 22–32)
Calcium: 9.8 mg/dL (ref 8.9–10.3)
Chloride: 100 mmol/L (ref 98–111)
Creatinine, Ser: 0.8 mg/dL (ref 0.61–1.24)
GFR, Estimated: >60 mL/min (ref >60)
Glucose, Bld: 302 mg/dL — ABNORMAL HIGH (ref 70–99)
Potassium: 4.4 mmol/L (ref 3.5–5.1)
Sodium: 141 mmol/L (ref 135–145)

## 2020-12-09 LAB — POCT I-STAT 7, (LYTES, BLD GAS, ICA,H+H)
Acid-Base Excess: 1 mmol/L (ref 0.0–2.0)
Bicarbonate: 27.9 mmol/L (ref 20.0–28.0)
Calcium, Ion: 1.29 mmol/L (ref 1.15–1.40)
HCT: 45 % (ref 39.0–52.0)
Hemoglobin: 15.3 g/dL (ref 13.0–17.0)
O2 Saturation: 96 %
Patient temperature: 36.4
Potassium: 4.4 mmol/L (ref 3.5–5.1)
Sodium: 142 mmol/L (ref 135–145)
TCO2: 30 mmol/L (ref 22–32)
pCO2 arterial: 51.9 mmHg — ABNORMAL HIGH (ref 32.0–48.0)
pH, Arterial: 7.336 — ABNORMAL LOW (ref 7.350–7.450)
pO2, Arterial: 89 mmHg (ref 83.0–108.0)

## 2020-12-09 LAB — LACTIC ACID, PLASMA
Lactic Acid, Venous: 2.1 mmol/L (ref 0.5–1.9)
Lactic Acid, Venous: 3.2 mmol/L (ref 0.5–1.9)
Lactic Acid, Venous: 4.8 mmol/L (ref 0.5–1.9)
Lactic Acid, Venous: 5.8 mmol/L (ref 0.5–1.9)

## 2020-12-09 LAB — CBC
HCT: 54.8 % — ABNORMAL HIGH (ref 39.0–52.0)
Hemoglobin: 16.9 g/dL (ref 13.0–17.0)
MCH: 26.9 pg (ref 26.0–34.0)
MCHC: 30.8 g/dL (ref 30.0–36.0)
MCV: 87.1 fL (ref 80.0–100.0)
Platelets: 339 10*3/uL (ref 150–400)
RBC: 6.29 MIL/uL — ABNORMAL HIGH (ref 4.22–5.81)
RDW: 18.6 % — ABNORMAL HIGH (ref 11.5–15.5)
WBC: 12.3 10*3/uL — ABNORMAL HIGH (ref 4.0–10.5)
nRBC: 0.2 % (ref 0.0–0.2)

## 2020-12-09 LAB — PHOSPHORUS
Phosphorus: 4.8 mg/dL — ABNORMAL HIGH (ref 2.5–4.6)
Phosphorus: 2 mg/dL — ABNORMAL LOW (ref 2.5–4.6)
Phosphorus: 2.7 mg/dL (ref 2.5–4.6)

## 2020-12-09 LAB — MAGNESIUM
Magnesium: 2.1 mg/dL (ref 1.7–2.4)
Magnesium: 1.6 mg/dL — ABNORMAL LOW (ref 1.7–2.4)
Magnesium: 1.8 mg/dL (ref 1.7–2.4)

## 2020-12-09 LAB — SAMPLE TO BLOOD BANK

## 2020-12-09 LAB — MRSA NEXT GEN BY PCR, NASAL: MRSA by PCR Next Gen: DETECTED — AB

## 2020-12-09 LAB — STREP PNEUMONIAE URINARY ANTIGEN: Strep Pneumo Urinary Antigen: NEGATIVE

## 2020-12-09 MED ORDER — PROSOURCE TF PO LIQD
45.0000 mL | Freq: Two times a day (BID) | ORAL | Status: DC
  Administered 2020-12-09 – 2020-12-16 (×16): 45 mL
  Filled 2020-12-09 (×18): qty 45

## 2020-12-09 MED ORDER — PRAVASTATIN SODIUM 40 MG PO TABS
40.0000 mg | ORAL_TABLET | Freq: Every day | ORAL | Status: DC
  Administered 2020-12-09 – 2020-12-16 (×8): 40 mg
  Filled 2020-12-09 (×9): qty 1

## 2020-12-09 MED ORDER — PANTOPRAZOLE SODIUM 40 MG PO PACK
40.0000 mg | PACK | Freq: Every day | ORAL | Status: DC
  Administered 2020-12-09 – 2020-12-16 (×8): 40 mg
  Filled 2020-12-09 (×9): qty 20

## 2020-12-09 MED ORDER — VITAL HIGH PROTEIN PO LIQD
1000.0000 mL | ORAL | Status: DC
  Administered 2020-12-09: 1000 mL

## 2020-12-09 MED ORDER — ORAL CARE MOUTH RINSE
15.0000 mL | OROMUCOSAL | Status: DC
  Administered 2020-12-09 – 2020-12-17 (×81): 15 mL via OROMUCOSAL

## 2020-12-09 MED ORDER — GABAPENTIN 250 MG/5ML PO SOLN
100.0000 mg | Freq: Two times a day (BID) | ORAL | Status: DC
  Administered 2020-12-09 – 2020-12-16 (×16): 100 mg
  Filled 2020-12-09 (×18): qty 2

## 2020-12-09 MED ORDER — CLOPIDOGREL BISULFATE 75 MG PO TABS
75.0000 mg | ORAL_TABLET | Freq: Every day | ORAL | Status: DC
  Administered 2020-12-09 – 2020-12-16 (×8): 75 mg
  Filled 2020-12-09 (×9): qty 1

## 2020-12-09 MED ORDER — CHLORHEXIDINE GLUCONATE 0.12% ORAL RINSE (MEDLINE KIT)
15.0000 mL | Freq: Two times a day (BID) | OROMUCOSAL | Status: DC
  Administered 2020-12-09 – 2020-12-17 (×17): 15 mL via OROMUCOSAL

## 2020-12-09 MED ORDER — MUPIROCIN 2 % EX OINT
1.0000 "application " | TOPICAL_OINTMENT | Freq: Two times a day (BID) | CUTANEOUS | Status: AC
  Administered 2020-12-09 – 2020-12-13 (×10): 1 via NASAL
  Filled 2020-12-09: qty 22

## 2020-12-09 MED ORDER — ASPIRIN 81 MG PO CHEW
81.0000 mg | CHEWABLE_TABLET | Freq: Every day | ORAL | Status: DC
  Administered 2020-12-09 – 2020-12-16 (×8): 81 mg
  Filled 2020-12-09 (×9): qty 1

## 2020-12-09 MED ORDER — IPRATROPIUM-ALBUTEROL 0.5-2.5 (3) MG/3ML IN SOLN
3.0000 mL | RESPIRATORY_TRACT | Status: DC
  Administered 2020-12-09 – 2020-12-10 (×9): 3 mL via RESPIRATORY_TRACT
  Filled 2020-12-09 (×8): qty 3

## 2020-12-09 MED ORDER — VITAL AF 1.2 CAL PO LIQD
1000.0000 mL | ORAL | Status: DC
  Administered 2020-12-09 – 2020-12-16 (×11): 1000 mL

## 2020-12-09 MED ORDER — SODIUM CHLORIDE 0.9% FLUSH: 10.0000 mL | INTRAVENOUS | Status: DC | PRN

## 2020-12-09 MED ORDER — CHLORHEXIDINE GLUCONATE CLOTH 2 % EX PADS
6.0000 | MEDICATED_PAD | Freq: Every day | CUTANEOUS | Status: AC
Start: 2020-12-09 — End: 2020-12-13
  Administered 2020-12-09 – 2020-12-11 (×4): 6 via TOPICAL

## 2020-12-09 MED ORDER — PREDNISONE 5 MG/5ML PO SOLN: 40.0000 mg | Freq: Every day | ORAL | Status: DC

## 2020-12-09 MED ORDER — NOREPINEPHRINE 4 MG/250ML-% IV SOLN
0.0000 ug/min | INTRAVENOUS | Status: DC
  Administered 2020-12-09: 12 ug/min via INTRAVENOUS
  Administered 2020-12-09: 5 ug/min via INTRAVENOUS
  Filled 2020-12-09 (×2): qty 250

## 2020-12-09 MED ORDER — BUSPIRONE HCL 5 MG PO TABS
5.0000 mg | ORAL_TABLET | Freq: Two times a day (BID) | ORAL | Status: DC
  Administered 2020-12-09 – 2020-12-10 (×3): 5 mg
  Filled 2020-12-09 (×3): qty 1

## 2020-12-09 MED ORDER — INSULIN GLARGINE-YFGN 100 UNIT/ML ~~LOC~~ SOLN
10.0000 [IU] | Freq: Every day | SUBCUTANEOUS | Status: DC
  Administered 2020-12-09 – 2020-12-13 (×5): 10 [IU] via SUBCUTANEOUS
  Filled 2020-12-09 (×5): qty 0.1

## 2020-12-09 MED ORDER — PREDNISONE 20 MG PO TABS
40.0000 mg | ORAL_TABLET | Freq: Every day | ORAL | Status: DC
  Administered 2020-12-09 – 2020-12-14 (×6): 40 mg
  Filled 2020-12-09 (×6): qty 2

## 2020-12-09 MED ORDER — SODIUM CHLORIDE 0.9% FLUSH
10.0000 mL | Freq: Two times a day (BID) | INTRAVENOUS | Status: DC
Start: 2020-12-09 — End: 2020-12-17
  Administered 2020-12-09 – 2020-12-16 (×16): 10 mL

## 2020-12-09 NOTE — Progress Notes (Signed)
Initial Nutrition Assessment  DOCUMENTATION CODES:   Not applicable  INTERVENTION:   Initiate tube feeding via OG tube: Vital AF 1.2 at 60 ml/h (1440 ml per day) Prosource TF 45 ml BID  Provides 1808 kcal (1985 kcal total with propofol), 130 gm protein, 1168 ml free water daily  NUTRITION DIAGNOSIS:   Inadequate oral intake related to inability to eat as evidenced by NPO status.  GOAL:   Patient will meet greater than or equal to 90% of their needs  MONITOR:   Vent status, TF tolerance, Labs  REASON FOR ASSESSMENT:   Ventilator, Consult Enteral/tube feeding initiation and management  ASSESSMENT:   70 yo male admitted with hypoxic respiratory failure, CAP. PMH includes HTN, HLD, CAD, OSA, COPD, DM, anxiety, depression, GERD, gastritis.  Discussed patient in ICU rounds and with RN today. Received MD Consult for TF initiation and management. OG tube in place.   Patient is currently intubated on ventilator support MV: 13.5 L/min Temp (24hrs), Avg:97.4 F (36.3 C), Min:97 F (36.1 C), Max:98.1 F (36.7 C)  Propofol: 6.7 ml/hr providing 177 kcal from lipid  Labs reviewed. Phos 4.8 CBG: 247-245  Medications reviewed and include Novolog, Semglee, prednisone, levophed, propofol.  Weight history reviewed. Current weight is 123% of usual weight from 1 year ago. Current weight could be up d/t edema.  NUTRITION - FOCUSED PHYSICAL EXAM:  Flowsheet Row Most Recent Value  Orbital Region No depletion  Upper Arm Region No depletion  Thoracic and Lumbar Region No depletion  Buccal Region Unable to assess  Temple Region No depletion  Clavicle Bone Region No depletion  Clavicle and Acromion Bone Region No depletion  Scapular Bone Region Unable to assess  Dorsal Hand No depletion  Patellar Region No depletion  Anterior Thigh Region No depletion  Posterior Calf Region No depletion  Edema (RD Assessment) Moderate  Hair Reviewed  Eyes Unable to assess  Mouth Unable to  assess  Skin Reviewed  Nails Reviewed       Diet Order:   Diet Order             Diet NPO time specified  Diet effective now                   EDUCATION NEEDS:   Not appropriate for education at this time  Skin:  Skin Assessment: Reviewed RN Assessment  Last BM:  7/26  Height:   Ht Readings from Last 1 Encounters:  11/15/2020 '5\' 10"'$  (1.778 m)    Weight:   Wt Readings from Last 1 Encounters:  12/09/20 92.1 kg    Ideal Body Weight:  75.5 kg  BMI:  Body mass index is 29.13 kg/m.  Estimated Nutritional Needs:   Kcal:  2000  Protein:  120-135 gm  Fluid:  >/= 2 L    Lucas Mallow, RD, LDN, CNSC Please refer to Amion for contact information.

## 2020-12-09 NOTE — Progress Notes (Addendum)
eLink Physician-Brief Progress Note Patient Name: KEYMARI ZUEGE DOB: Jan 21, 1951 MRN: [id number]   Date of Service  12/09/2020  HPI/Events of Note  72M with chronic hypoxemic respiratory failure due to severe COPD who is admitted with acute on chronic hypoxemic and hypercarbic respiratory failure due to COPD exacerbation.  The patient was intubated in the ED for severe hypoxemia and obtundation. Had hypotension post-intubation requiring vasopressors.  On my exam via camera, patient is intubated and sedated. Accompanied by his wife. He is on VC 520x26, 10, 60% on the vent. HR 69 (SR), BP 113/76 (MAP 88) on levophed at 16 mcg/min, SpO2 94-96%.  Ventilator waveforms show a prolonged expiratory phase. Current I:E ratio is 1:2.  Latest ABG: 7.29/64.6/372/34/BE 3.0. Unfortunately, it is not clear what ventilator settings the patient was on prior to this blood gas.   eICU Interventions  # Neuro: - Fentanyl/propofol as needed for sedation/analgesia.  # Cardiac: - Hypotension: S/p 2.5L IVF bolus in ED. Currently on levophed, but with blood pressure room to wean (MAP is 88 currently). LR @ 125 mL/hr.  # Respiratory: - COPD Exacerbation c/b Hypoxemic and Hypercarbic respiratory failure: Q4H DuoNebs, methylprednisolone '40mg'$  Q12H, CTX/azithromycin empirically, f/u tracheal aspirate and Bcx. - Repeat ABG at 0200 hrs. Would prefer to reduce RR slightly and prolong I:E ratio if able to minimize risk of air trapping.  # Endocrine: - Q4H ISS  DVT PPX: Lovenox ppx GI PPX: Protonix IV   ADDENDUM 12/09/20 5:38 AM  - Changed levophed order to reflect central line infusion parameters (rather than peripheral infusion), since patient has central access.  Intervention Category Evaluation Type: New Patient Evaluation  Marily Lente Braddock Servellon 12/09/2020, 1:02 AM

## 2020-12-09 NOTE — Progress Notes (Addendum)
NAME:  TACOMA DRUMM, MRN:  [id number], DOB:  [birth year], LOS: 1 ADMISSION DATE:  11/19/2020 CONSULTATION DATE:  11/16/2020 REFERRING MD:  Lavenia Atlas CHIEF COMPLAINT:  SOB, hypercapnic/hypoxic respiratory failure   History of Present Illness:  70 year old man with PMHx significant for HTN, HLD, CAD (4-vessel), OSA (on CPAP), COPD (GOLD stage III, on 4LNC home O2) who presented to [facility] ED 7/25 via EMS for SOB.   At home, hypoxic on [facility] with sats 70%. Transitioned to NRB then CPAP. DuoNeb and Solumedrol administered in the field. Initially GCS 14, quickly declining to GCS 3 in ED. Patient subsequently intubated in ED for hypoxia and AMS. ABG showed pH 7.294/pCO2 64.6/pO2 372/Bicarb 31.6. Patient became progressively hypotensive post-intubation requiring Levophed initiation and CVL placement by ED.  PCCM consulted for admission.  Pertinent Medical History:  Four-vessel coronary artery disease, hypertension, hyperlipidemia, OSA on CPAP, Gold stage III COPD with resultant chronic respiratory failure on 4 L baseline supplemental oxygen.  Anxiety, depression, diabetes, gastritis.  GERD, hyperlipidemia, hypertension.  Significant Hospital Events: Including procedures, antibiotic start and stop dates in addition to other pertinent events   7/25 - BIB EMS for SOB, hypoxic/hypercapnic respiratory failure. Initially maintained on CPAP, became progressively altered (GCS 3) requiring intubation. PCCM consulted for admission.  Hypotensive following intubation, right IJ triple-lumen catheter placed by ER.  Started on norepinephrine.  IV fluids administered.  From pulmonary standpoint in addition to mechanical ventilation therapeutic interventions included: Scheduled bronchodilators, IV systemic steroids, and ceftriaxone with azithromycin.  Respiratory culture sent. 7/26: Heavily sedated, apparently was agitated overnight.  Still requiring norepinephrine infusion.,  Lactate actually trending up instead of  down.  Bleeding from right IJ triple-lumen catheter  Interim History / Subjective:  Heavily sedated on mechanical ventilation  Objective:  Blood pressure 112/76, pulse 75, temperature 98 F (36.7 C), temperature source Oral, resp. rate 16, height '5\' 10"'$  (1.778 m), weight 92.1 kg, SpO2 97 %.    Vent Mode: PRVC FiO2 (%):  [50 %-100 %] 50 % Set Rate:  [18 bmp-26 bmp] 26 bmp Vt Set:  [520 mL-600 mL] 520 mL PEEP:  [8 cmH20-10 cmH20] 10 cmH20 Plateau Pressure:  [16 cmH20-21 cmH20] 21 cmH20   Intake/Output Summary (Last 24 hours) at 12/09/2020 0815 Last data filed at 12/09/2020 0553 Gross per 24 hour  Intake 3148.38 ml  Output 275 ml  Net 2873.38 ml   Filed Weights   12/10/2020 2310 12/09/20 0245  Weight: 74.4 kg 92.1 kg    Physical Examination: General: 70 year old chronically ill male patient currently on full ventilatory support, heavily sedated on propofol and fentanyl infusions. HEENT orally intubated no obvious jugular venous distention sclera nonicteric.  He has a right IJ triple-lumen catheter which is in place, has fairly large blood clot sitting underneath the dressing Pulmonary: Diminished bilaterally.  Currently on full ventilatory support Plateau pressures 24, no audible wheezing. Cardiac: Regular rate and rhythm without murmur rub or gallop Abdomen: Soft not tender no organomegaly, positive bowel sounds GU: Foley catheter in place yellow urine noted Neuro: Currently heavily sedated not responding to loud verbal commands, responds to noxious stimulus.  No focal deficits noted  Resolved Hospital Problem List    Assessment & Plan:  Acute-on-chronic hypoxemic/hypercapnic respiratory failure in the setting of acute exacerbation chronic obstructive pulmonary disease and community-acquired pneumonia History of COPD, GOLD stage III on 4L [place] baseline Portable chest x-ray reviewed personally today.  This demonstrates slight improved aeration with left greater than right airspace  disease support  tubes and lines appear to be in satisfactory position.  Cannot exclude element of pleural effusion on the left Plan Continuing full ventilator support Continue to wean PEEP/FiO2, you have a pulse oximetry and arterial blood gas suggest we can come down on PEEP today further Continue scheduled bronchodilators Taper systemic steroids, currently day #2, will change to be administered via tube and slowly reduce back to 10 mg a day over the next few days Follow-up sputum culture, not clear that this is been sent Day #2 azithromycin and ceftriaxone, MRSA PCR was positive but will hold off on IV vancomycin unless clinical picture deteriorates further VAP bundle PAD protocol, changing RASS goal to -1 A.m. chest x-ray May evaluate left chest with ultrasound tomorrow if chest x-ray not clearing, rule out effusion  Septic shock secondary to community-acquired pneumonia Lactate continues to climb Plan Holding home antihypertensives Transduce central venous pressure Attempting to wean sedating drips, these may be contributing to hypotension as well Continue current IV fluid administration Continue telemetry Continue to titrate norepinephrine  Lactic acidosis  Plan Repeat LA   History of HTN, four-vessel coronary artery disease, and hyperlipidemia. Plan Currently holding Norvasc and atenolol. We will go ahead and place him on chewable aspirin via tube, resume Plavix, resume pravastatin. Continue telemetry monitoring.  Type 2 diabetes mellitus, Currently with steroid-induced hyperglycemia. Plan Continue resistant sliding scale insulin Add 10 units of Lantus via tube daily. Reducing steroids Continue CBGs  History of anxiety and depression Plan Resume BuSpar, and Neurontin.  Best Practice: (right click and "Reselect all SmartList Selections" daily)   Diet/type: tubefeeds DVT prophylaxis: LMWH GI prophylaxis: PPI Lines: Central line Foley:  Yes, and it is still  needed Code Status:  full code Last date of multidisciplinary goals of care discussion [Pending]  Critical care time: 32 min    Erick Colace ACNP-BC [place] Pager # [phone] OR # [phone] if no answer  Attending:    Subjective: Admitted overnight with increasing dyspea Intubated in the ER in setting of hypercarbic respiratory failure Has baseline severe COPD on chronic prednisone followed by Dr. Melvyn Novas  Objective: Vitals:   12/09/20 1111 12/09/20 1113 12/09/20 1115 12/09/20 1200  BP:    139/76  Pulse:    98  Resp:    (!) 29  Temp:   97.8 F (36.6 C)   TempSrc:   Oral   SpO2: 95% 95%  93%  Weight:      Height:       Vent Mode: PRVC FiO2 (%):  [50 %-100 %] 50 % Set Rate:  [18 bmp-26 bmp] 26 bmp Vt Set:  [520 mL-600 mL] 520 mL PEEP:  [8 cmH20-10 cmH20] 10 cmH20 Plateau Pressure:  [16 cmH20-21 cmH20] 20 cmH20  Intake/Output Summary (Last 24 hours) at 12/09/2020 1236 Last data filed at 12/09/2020 1038 Gross per 24 hour  Intake 3553.18 ml  Output 450 ml  Net 3103.18 ml    General:  In bed on vent HENT: NCAT ETT in place PULM: CTA B, vent supported breathing CV: RRR, no mgr GI: BS+, soft, nontender MSK: normal bulk and tone Neuro: sedated on vent    CBC    Component Value Date/Time   WBC 12.8 (H) 12/09/2020 0327   RBC 5.32 12/09/2020 0327   HGB 14.6 12/09/2020 0327   HCT 44.5 12/09/2020 0327   PLT 288 12/09/2020 0327   MCV 83.6 12/09/2020 0327   MCH 27.4 12/09/2020 0327   MCHC 32.8 12/09/2020 0327  RDW 17.7 (H) 12/09/2020 0327   LYMPHSABS 2.1 11/20/2020 2142   MONOABS 0.9 12/14/2020 2142   EOSABS 0.0 11/24/2020 2142   BASOSABS 0.1 11/23/2020 2142    BMET    Component Value Date/Time   NA 142 12/09/2020 0206   K 4.4 12/09/2020 0206   CL 100 12/09/2020 0150   CO2 31 12/09/2020 0150   GLUCOSE 302 (H) 12/09/2020 0150   BUN 13 12/09/2020 0150   CREATININE 0.80 12/09/2020 0150   CALCIUM 9.8 12/09/2020 0150   GFRNONAA >60  12/09/2020 0150   GFRAA >60 11/11/2019 0234    CXR images personally reviewed, LLL infiltrate? Emphysema, ETT in place  Impression/Plan: Acute respiratory failure with hypoxemia due to severe CAP: wean down steroids, continue ceftriaoxne and azithromycin, continue full vent support Severe COPD: wean steroids, continue scheduled duoneb for now, not wheezing so holding off on LABA/ICS for now Septic shock: wean off levophed for MAP > 65  Rest per NP note  My cc time 25 minutes  Roselie Awkward, MD [place] [facility] Pager: [phone] Cell: [phone] After 7pm: [phone]

## 2020-12-09 NOTE — Progress Notes (Addendum)
ER RT found pt on these charted settings after ABG obtained and adjustments made base on the original settings.  RT receiving pt in ICU will obtain another ABG within one hour on new settings.  Order must be changed if these settings remain as order states VT be pt's 8cc which is 580.

## 2020-12-09 NOTE — Progress Notes (Signed)
Notified bedside nurse of need to draw repeat lactic acid. 

## 2020-12-10 ENCOUNTER — Inpatient Hospital Stay (HOSPITAL_COMMUNITY): Payer: Medicare Other

## 2020-12-10 DIAGNOSIS — J9602 Acute respiratory failure with hypercapnia: Secondary | ICD-10-CM | POA: Diagnosis not present

## 2020-12-10 DIAGNOSIS — J441 Chronic obstructive pulmonary disease with (acute) exacerbation: Secondary | ICD-10-CM | POA: Diagnosis not present

## 2020-12-10 DIAGNOSIS — J9601 Acute respiratory failure with hypoxia: Secondary | ICD-10-CM | POA: Diagnosis not present

## 2020-12-10 LAB — BLOOD CULTURE ID PANEL (REFLEXED) - BCID2

## 2020-12-10 LAB — CBC
HCT: 38.7 % — ABNORMAL LOW (ref 39.0–52.0)
Hemoglobin: 12.7 g/dL — ABNORMAL LOW (ref 13.0–17.0)
MCH: 27.5 pg (ref 26.0–34.0)
MCHC: 32.8 g/dL (ref 30.0–36.0)
MCV: 83.8 fL (ref 80.0–100.0)
Platelets: 308 10*3/uL (ref 150–400)
RBC: 4.62 MIL/uL (ref 4.22–5.81)
RDW: 17.9 % — ABNORMAL HIGH (ref 11.5–15.5)
WBC: 13.1 10*3/uL — ABNORMAL HIGH (ref 4.0–10.5)
nRBC: 0 % (ref 0.0–0.2)

## 2020-12-10 LAB — GLUCOSE, CAPILLARY
Glucose-Capillary: 165 mg/dL — ABNORMAL HIGH (ref 70–99)
Glucose-Capillary: 151 mg/dL — ABNORMAL HIGH (ref 70–99)
Glucose-Capillary: 192 mg/dL — ABNORMAL HIGH (ref 70–99)
Glucose-Capillary: 185 mg/dL — ABNORMAL HIGH (ref 70–99)
Glucose-Capillary: 178 mg/dL — ABNORMAL HIGH (ref 70–99)

## 2020-12-10 LAB — HEMOGLOBIN A1C
Hgb A1c MFr Bld: 7.8 % — ABNORMAL HIGH (ref 4.8–5.6)
Mean Plasma Glucose: 177 mg/dL

## 2020-12-10 LAB — BASIC METABOLIC PANEL
Anion gap: 7 (ref 5–15)
Anion gap: 6 (ref 5–15)
BUN: 14 mg/dL (ref 8–23)
BUN: 17 mg/dL (ref 8–23)
CO2: 28 mmol/L (ref 22–32)
CO2: 28 mmol/L (ref 22–32)
Calcium: 8.6 mg/dL — ABNORMAL LOW (ref 8.9–10.3)
Calcium: 8.6 mg/dL — ABNORMAL LOW (ref 8.9–10.3)
Chloride: 105 mmol/L (ref 98–111)
Chloride: 99 mmol/L (ref 98–111)
Creatinine, Ser: 0.88 mg/dL (ref 0.61–1.24)
Creatinine, Ser: 0.85 mg/dL (ref 0.61–1.24)
GFR, Estimated: >60 mL/min (ref >60)
GFR, Estimated: >60 mL/min (ref >60)
Glucose, Bld: 159 mg/dL — ABNORMAL HIGH (ref 70–99)
Glucose, Bld: 183 mg/dL — ABNORMAL HIGH (ref 70–99)
Potassium: 4 mmol/L (ref 3.5–5.1)
Potassium: 4.2 mmol/L (ref 3.5–5.1)
Sodium: 140 mmol/L (ref 135–145)
Sodium: 133 mmol/L — ABNORMAL LOW (ref 135–145)

## 2020-12-10 LAB — LACTIC ACID, PLASMA: Lactic Acid, Venous: 2.3 mmol/L (ref 0.5–1.9)

## 2020-12-10 LAB — LEGIONELLA PNEUMOPHILA SEROGP 1 UR AG: L. pneumophila Serogp 1 Ur Ag: NEGATIVE

## 2020-12-10 LAB — MAGNESIUM
Magnesium: 1.9 mg/dL (ref 1.7–2.4)
Magnesium: 2.2 mg/dL (ref 1.7–2.4)

## 2020-12-10 LAB — PHOSPHORUS
Phosphorus: 2.2 mg/dL — ABNORMAL LOW (ref 2.5–4.6)
Phosphorus: 3.6 mg/dL (ref 2.5–4.6)

## 2020-12-10 MED ORDER — BUDESONIDE 0.25 MG/2ML IN SUSP
0.2500 mg | Freq: Two times a day (BID) | RESPIRATORY_TRACT | Status: DC
  Administered 2020-12-10 – 2020-12-15 (×11): 0.25 mg via RESPIRATORY_TRACT
  Filled 2020-12-10 (×11): qty 2

## 2020-12-10 MED ORDER — ALBUTEROL SULFATE (2.5 MG/3ML) 0.083% IN NEBU
2.5000 mg | INHALATION_SOLUTION | RESPIRATORY_TRACT | Status: DC | PRN
  Filled 2020-12-10: qty 3

## 2020-12-10 MED ORDER — MAGNESIUM SULFATE IN D5W 1-5 GM/100ML-% IV SOLN
1.0000 g | Freq: Once | INTRAVENOUS | Status: AC
  Administered 2020-12-10: 1 g via INTRAVENOUS
  Filled 2020-12-10: qty 100

## 2020-12-10 MED ORDER — MIRTAZAPINE 30 MG PO TBDP
45.0000 mg | ORAL_TABLET | Freq: Every day | ORAL | Status: DC
  Administered 2020-12-10 – 2020-12-16 (×7): 45 mg
  Filled 2020-12-10 (×8): qty 1

## 2020-12-10 MED ORDER — REVEFENACIN 175 MCG/3ML IN SOLN
175.0000 ug | Freq: Every day | RESPIRATORY_TRACT | Status: DC
  Administered 2020-12-10 – 2020-12-13 (×4): 175 ug via RESPIRATORY_TRACT
  Filled 2020-12-10 (×4): qty 3

## 2020-12-10 MED ORDER — SODIUM PHOSPHATES 45 MMOLE/15ML IV SOLN
20.0000 mmol | Freq: Once | INTRAVENOUS | Status: AC
  Administered 2020-12-10: 20 mmol via INTRAVENOUS
  Filled 2020-12-10: qty 6.67

## 2020-12-10 MED ORDER — ARFORMOTEROL TARTRATE 15 MCG/2ML IN NEBU
15.0000 ug | INHALATION_SOLUTION | Freq: Two times a day (BID) | RESPIRATORY_TRACT | Status: DC
  Administered 2020-12-10 – 2020-12-13 (×7): 15 ug via RESPIRATORY_TRACT
  Filled 2020-12-10 (×7): qty 2

## 2020-12-10 NOTE — Progress Notes (Signed)
PHARMACY - PHYSICIAN COMMUNICATION CRITICAL VALUE ALERT - BLOOD CULTURE IDENTIFICATION (BCID)  [name] is an 70 y.o. male who presented to [facility] on 11/28/2020 with a chief complaint of SOB.  Assessment:  Admitted and started on ABX for acute on chronic respiratory failure, now w/ Staph spp growing in 1 of 2 blood cx bottles, likely contaminant.  Name of physician (or Provider) Contacted: SSommer MD  Current antibiotics: ceftriaxone and azithro  Changes to prescribed antibiotics recommended:  No changes needed at this time.  Results for orders placed or performed during the hospital encounter of 12/06/2020  Blood Culture ID Panel (Reflexed) (Collected: 12/09/2020  3:25 AM)  Result Value Ref Range   Enterococcus faecalis NOT DETECTED NOT DETECTED   Enterococcus Faecium NOT DETECTED NOT DETECTED   Listeria monocytogenes NOT DETECTED NOT DETECTED   Staphylococcus species DETECTED (A) NOT DETECTED   Staphylococcus aureus (BCID) NOT DETECTED NOT DETECTED   Staphylococcus epidermidis NOT DETECTED NOT DETECTED   Staphylococcus lugdunensis NOT DETECTED NOT DETECTED   Streptococcus species NOT DETECTED NOT DETECTED   Streptococcus agalactiae NOT DETECTED NOT DETECTED   Streptococcus pneumoniae NOT DETECTED NOT DETECTED   Streptococcus pyogenes NOT DETECTED NOT DETECTED   A.calcoaceticus-baumannii NOT DETECTED NOT DETECTED   Bacteroides fragilis NOT DETECTED NOT DETECTED   Enterobacterales NOT DETECTED NOT DETECTED   Enterobacter cloacae complex NOT DETECTED NOT DETECTED   Escherichia coli NOT DETECTED NOT DETECTED   Klebsiella aerogenes NOT DETECTED NOT DETECTED   Klebsiella oxytoca NOT DETECTED NOT DETECTED   Klebsiella pneumoniae NOT DETECTED NOT DETECTED   Proteus species NOT DETECTED NOT DETECTED   Salmonella species NOT DETECTED NOT DETECTED   Serratia marcescens NOT DETECTED NOT DETECTED   Haemophilus influenzae NOT DETECTED NOT DETECTED   Neisseria meningitidis NOT  DETECTED NOT DETECTED   Pseudomonas aeruginosa NOT DETECTED NOT DETECTED   Stenotrophomonas maltophilia NOT DETECTED NOT DETECTED   Candida albicans NOT DETECTED NOT DETECTED   Candida auris NOT DETECTED NOT DETECTED   Candida glabrata NOT DETECTED NOT DETECTED   Candida krusei NOT DETECTED NOT DETECTED   Candida parapsilosis NOT DETECTED NOT DETECTED   Candida tropicalis NOT DETECTED NOT DETECTED   Cryptococcus neoformans/gattii NOT DETECTED NOT DETECTED    Wynona Neat, PharmD, BCPS  12/10/2020  6:44 AM

## 2020-12-10 NOTE — Progress Notes (Signed)
Attending:    Subjective: Of levophed Remains mechanically ventilated Had some high sedation requirements Tachycardic overnight  Objective: Vitals:   12/10/20 0700 12/10/20 0728 12/10/20 0745 12/10/20 0800  BP: (!) 86/62   133/90  Pulse: 85   (!) 109  Resp: (!) 26   14  Temp:  97.6 F (36.4 C)    TempSrc:  Axillary    SpO2: 96%  93% 92%  Weight:      Height:       Vent Mode: PRVC FiO2 (%):  [50 %] 50 % Set Rate:  [26 bmp] 26 bmp Vt Set:  [520 mL] 520 mL PEEP:  [10 cmH20] 10 cmH20 Plateau Pressure:  [19 cmH20-21 cmH20] 20 cmH20  Intake/Output Summary (Last 24 hours) at 12/10/2020 0943 Last data filed at 12/10/2020 0940 Gross per 24 hour  Intake 3282.37 ml  Output 1130 ml  Net 2152.37 ml    General:  In bed on vent HENT: NCAT ETT in place PULM: Poor air movement, wheezing bilaterally B, vent supported breathing CV: RRR, no mgr GI: BS+, soft, nontender MSK: normal bulk and tone Neuro: sedated on vent    CBC    Component Value Date/Time   WBC 13.1 (H) 12/10/2020 0338   RBC 4.62 12/10/2020 0338   HGB 12.7 (L) 12/10/2020 0338   HCT 38.7 (L) 12/10/2020 0338   PLT 308 12/10/2020 0338   MCV 83.8 12/10/2020 0338   MCH 27.5 12/10/2020 0338   MCHC 32.8 12/10/2020 0338   RDW 17.9 (H) 12/10/2020 0338   LYMPHSABS 2.1 11/21/2020 2142   MONOABS 0.9 11/18/2020 2142   EOSABS 0.0 11/21/2020 2142   BASOSABS 0.1 12/06/2020 2142    BMET    Component Value Date/Time   NA 140 12/10/2020 0338   K 4.0 12/10/2020 0338   CL 105 12/10/2020 0338   CO2 28 12/10/2020 0338   GLUCOSE 159 (H) 12/10/2020 0338   BUN 14 12/10/2020 0338   CREATININE 0.88 12/10/2020 0338   CALCIUM 8.6 (L) 12/10/2020 0338   GFRNONAA >60 12/10/2020 0338   GFRAA >60 11/11/2019 0234   Lactic acid improved to 2.3 Micro: blood culture staph species 1/4 bottles Urine culture: GNR  CXR images reviewed, left lung infiltrate ETT and CVL in place  Impression/Plan: Acute respiratory failure with  hypoxemia from severe CAP:  Full mechanical vent support: decrease PEEP today VAP prevention Daily WUA/SBT  COPD, advanced but not clearly in exacerbation: duoneb > change to albuterol prn; add yulperi, add alformoterol, add pulmicort as well, maintain prednisone '40mg'$  daily  CAP: continue ceftriaxone and azithromycin  Septic shock: improved, monitor off levophed  DM2 Continue lantus, SSI  Anxiety/depression: add home remeron, stop buspar  My cc time 40 minutes  Edward Awkward, MD [place] [facility] Pager: [phone] Cell: [phone] After 7pm: [phone]

## 2020-12-10 NOTE — Progress Notes (Addendum)
eLink Physician-Brief Progress Note Patient Name: [name] DOB: 12-03-1950 MRN: [id number]   Date of Service  12/10/2020  HPI/Events of Note  Patient had 14 beat run of NSVT. Now in NSR. K+ = 4.0, Mg++ = 1.9, PO4--- = 2.2 and Creatinine = 0.88.  eICU Interventions  Will replace Mg++ and PO4---.     Intervention Category Major Interventions: Arrhythmia - evaluation and management  [name] [name] 12/10/2020, 4:34 AM

## 2020-12-10 NOTE — Progress Notes (Signed)
Pt had a 14 beat run of vtach @ 0326. [facility] provider notified, morning labs drawn. Will continue to monitor closely.

## 2020-12-11 ENCOUNTER — Inpatient Hospital Stay (HOSPITAL_COMMUNITY): Payer: Medicare Other

## 2020-12-11 DIAGNOSIS — J9602 Acute respiratory failure with hypercapnia: Secondary | ICD-10-CM | POA: Diagnosis not present

## 2020-12-11 DIAGNOSIS — J441 Chronic obstructive pulmonary disease with (acute) exacerbation: Secondary | ICD-10-CM | POA: Diagnosis not present

## 2020-12-11 LAB — BASIC METABOLIC PANEL
Anion gap: 5 (ref 5–15)
BUN: 24 mg/dL — ABNORMAL HIGH (ref 8–23)
CO2: 36 mmol/L — ABNORMAL HIGH (ref 22–32)
Calcium: 8.7 mg/dL — ABNORMAL LOW (ref 8.9–10.3)
Chloride: 103 mmol/L (ref 98–111)
Creatinine, Ser: 0.93 mg/dL (ref 0.61–1.24)
GFR, Estimated: >60 mL/min (ref >60)
Glucose, Bld: 184 mg/dL — ABNORMAL HIGH (ref 70–99)
Potassium: 4.4 mmol/L (ref 3.5–5.1)
Sodium: 144 mmol/L (ref 135–145)

## 2020-12-11 LAB — GLUCOSE, CAPILLARY
Glucose-Capillary: 142 mg/dL — ABNORMAL HIGH (ref 70–99)
Glucose-Capillary: 146 mg/dL — ABNORMAL HIGH (ref 70–99)
Glucose-Capillary: 163 mg/dL — ABNORMAL HIGH (ref 70–99)
Glucose-Capillary: 172 mg/dL — ABNORMAL HIGH (ref 70–99)
Glucose-Capillary: 178 mg/dL — ABNORMAL HIGH (ref 70–99)
Glucose-Capillary: 183 mg/dL — ABNORMAL HIGH (ref 70–99)
Glucose-Capillary: 210 mg/dL — ABNORMAL HIGH (ref 70–99)

## 2020-12-11 LAB — POCT I-STAT 7, (LYTES, BLD GAS, ICA,H+H)
Acid-Base Excess: 9 mmol/L — ABNORMAL HIGH (ref 0.0–2.0)
Bicarbonate: 40.1 mmol/L — ABNORMAL HIGH (ref 20.0–28.0)
Calcium, Ion: 1.27 mmol/L (ref 1.15–1.40)
HCT: 45 % (ref 39.0–52.0)
Hemoglobin: 15.3 g/dL (ref 13.0–17.0)
O2 Saturation: 81 %
Patient temperature: 100.5
Potassium: 4.3 mmol/L (ref 3.5–5.1)
Sodium: 144 mmol/L (ref 135–145)
TCO2: 43 mmol/L — ABNORMAL HIGH (ref 22–32)
pCO2 arterial: 87.1 mmHg (ref 32.0–48.0)
pH, Arterial: 7.276 — ABNORMAL LOW (ref 7.350–7.450)
pO2, Arterial: 57 mmHg — ABNORMAL LOW (ref 83.0–108.0)

## 2020-12-11 LAB — MAGNESIUM: Magnesium: 2.2 mg/dL (ref 1.7–2.4)

## 2020-12-11 MED ORDER — FUROSEMIDE 10 MG/ML IJ SOLN
40.0000 mg | Freq: Once | INTRAMUSCULAR | Status: AC
  Administered 2020-12-11: 40 mg via INTRAVENOUS
  Filled 2020-12-11: qty 4

## 2020-12-11 MED ORDER — SODIUM CHLORIDE 0.9 % IV SOLN
2.0000 g | Freq: Three times a day (TID) | INTRAVENOUS | Status: DC
  Administered 2020-12-11 – 2020-12-17 (×18): 2 g via INTRAVENOUS
  Filled 2020-12-11 (×20): qty 2

## 2020-12-11 MED ORDER — SODIUM CHLORIDE 0.9 % IV SOLN
2.0000 g | Freq: Three times a day (TID) | INTRAVENOUS | Status: DC
  Filled 2020-12-11 (×2): qty 2

## 2020-12-11 MED ORDER — CHLORHEXIDINE GLUCONATE CLOTH 2 % EX PADS
6.0000 | MEDICATED_PAD | Freq: Every day | CUTANEOUS | Status: DC
  Administered 2020-12-12 – 2020-12-16 (×5): 6 via TOPICAL

## 2020-12-11 NOTE — Progress Notes (Signed)
[place] Progress Note Patient Name: FRAZIER METZKER DOB: 31-Aug-1950 MRN: [id number]   Date of Service  12/11/2020  HPI/Events of Note  Nursing concerned about increased wheezing and crackles. Already on Albuterol Nebs Q 2 hours PRN.   eICU Interventions  Plan: Portable CXR STAT.     Intervention Category Major Interventions: Other:  Lysle Dingwall 12/11/2020, 2:08 AM

## 2020-12-11 NOTE — Progress Notes (Signed)
Attending:    Subjective: Difficult night: hypotensive briefly Hypoxemic overnight, FiO2 increased CXR worse  Objective: Vitals:   12/11/20 0725 12/11/20 0750 12/11/20 0800 12/11/20 0933  BP: 137/84  129/78   Pulse: (!) 119  (!) 114   Resp: 14  17   Temp:  99.6 F (37.6 C)    TempSrc:  Oral    SpO2: (!) 88%  91% 93%  Weight:      Height:       Vent Mode: PRVC FiO2 (%):  [40 %-80 %] 70 % Set Rate:  [14 bmp] 14 bmp Vt Set:  [510 mL] 510 mL PEEP:  [5 cmH20-8 cmH20] 8 cmH20 Pressure Support:  [10 cmH20] 10 cmH20 Plateau Pressure:  [15 cmH20-28 cmH20] 28 cmH20  Intake/Output Summary (Last 24 hours) at 12/11/2020 1007 Last data filed at 12/11/2020 0951 Gross per 24 hour  Intake 2331.09 ml  Output 2625 ml  Net -293.91 ml    General:  In bed on vent HENT: NCAT ETT in place PULM: Crackles/rhonchi bilaterally B, vent supported breathing CV: RRR, no mgr GI: BS+, soft, nontender MSK: normal bulk and tone Neuro: sedated on vent   CBC    Component Value Date/Time   WBC 13.1 (H) 12/10/2020 0338   RBC 4.62 12/10/2020 0338   HGB 12.7 (L) 12/10/2020 0338   HCT 38.7 (L) 12/10/2020 0338   PLT 308 12/10/2020 0338   MCV 83.8 12/10/2020 0338   MCH 27.5 12/10/2020 0338   MCHC 32.8 12/10/2020 0338   RDW 17.9 (H) 12/10/2020 0338   LYMPHSABS 2.1 12/02/2020 2142   MONOABS 0.9 12/13/2020 2142   EOSABS 0.0 11/18/2020 2142   BASOSABS 0.1 11/27/2020 2142    BMET    Component Value Date/Time   NA 133 (L) 12/10/2020 1839   K 4.2 12/10/2020 1839   CL 99 12/10/2020 1839   CO2 28 12/10/2020 1839   GLUCOSE 183 (H) 12/10/2020 1839   BUN 17 12/10/2020 1839   CREATININE 0.85 12/10/2020 1839   CALCIUM 8.6 (L) 12/10/2020 1839   GFRNONAA >60 12/10/2020 1839   GFRAA >60 11/11/2019 0234    CXR images personally reviewed: diffuse bilateral air space disease, ett in place  Impression/Plan: Worsening acute respiratory failure with hypoxemia, appears to have acute pulmonary edema on  today's study > lasix today > repeat CXR > continue full vent support > vap prevention  Pseudomonas pneumonia > change antibiotics to ceftazidime  Severe COPD: > yupelri, alformoterol and pulmicort to continue  Septic shock: improved  > monitor hemodynamics  Need for sedation > continue PAD protocol as ordered  Prognosis worse today, will discuss with his wife when she is available  My cc time 32 minutes  Roselie Awkward, MD [place] [facility] Pager: [phone] Cell: [phone] After 7pm: [phone]

## 2020-12-11 NOTE — Progress Notes (Addendum)
Elink notified of abg results. Drawn at 2106 12/11/20 [id number]

## 2020-12-11 NOTE — Progress Notes (Signed)
[place] Progress Note Patient Name: [name] DOB: 1950/09/06 MRN: [id number]   Date of Service  12/11/2020  HPI/Events of Note  ABG on 70%/PRVC 14/TV 510/P 8 = 7.27/87/57/40  eICU Interventions  Plan: Increase PRVC rate to 16. Repeat ABG at 5 AM.     Intervention Category Major Interventions: Acid-Base disturbance - evaluation and management;Respiratory failure - evaluation and management  Lysle Dingwall 12/11/2020, 9:44 PM

## 2020-12-11 NOTE — Progress Notes (Signed)
eLink Physician-Brief Progress Note Patient Name: [name] DOB: 04-27-1951 MRN: [id number]   Date of Service  12/11/2020  HPI/Events of Note  Review of portable CXR reveals moderate to marked severity bilateral lower lobe infiltrates, increased in severity when compared to the prior study. CXR looks c/w pulmonary vascular congestion to my read. Patient is already on Rocephin and Azithromycin. Not on Vaopressors. CVP = 16. BP = 98/68 with MAP = 77.  eICU Interventions  Plan: Lasix 40 mg IV X 1 now.      Intervention Category Major Interventions: Respiratory failure - evaluation and management;Hypoxemia - evaluation and management  [name] 12/11/2020, 3:10 AM

## 2020-12-12 ENCOUNTER — Inpatient Hospital Stay (HOSPITAL_COMMUNITY): Payer: Medicare Other

## 2020-12-12 DIAGNOSIS — I5031 Acute diastolic (congestive) heart failure: Secondary | ICD-10-CM | POA: Diagnosis not present

## 2020-12-12 DIAGNOSIS — J441 Chronic obstructive pulmonary disease with (acute) exacerbation: Secondary | ICD-10-CM | POA: Diagnosis not present

## 2020-12-12 LAB — POCT I-STAT 7, (LYTES, BLD GAS, ICA,H+H)
Acid-Base Excess: 10 mmol/L — ABNORMAL HIGH (ref 0.0–2.0)
Acid-Base Excess: 11 mmol/L — ABNORMAL HIGH (ref 0.0–2.0)
Bicarbonate: 39.5 mmol/L — ABNORMAL HIGH (ref 20.0–28.0)
Bicarbonate: 39.3 mmol/L — ABNORMAL HIGH (ref 20.0–28.0)
Calcium, Ion: 1.31 mmol/L (ref 1.15–1.40)
Calcium, Ion: 1.31 mmol/L (ref 1.15–1.40)
HCT: 42 % (ref 39.0–52.0)
HCT: 44 % (ref 39.0–52.0)
Hemoglobin: 14.3 g/dL (ref 13.0–17.0)
Hemoglobin: 15 g/dL (ref 13.0–17.0)
O2 Saturation: 85 %
O2 Saturation: 86 %
Patient temperature: 99.7
Patient temperature: 100.2
Potassium: 3.9 mmol/L (ref 3.5–5.1)
Potassium: 4.2 mmol/L (ref 3.5–5.1)
Sodium: 145 mmol/L (ref 135–145)
Sodium: 146 mmol/L — ABNORMAL HIGH (ref 135–145)
TCO2: 42 mmol/L — ABNORMAL HIGH (ref 22–32)
TCO2: 41 mmol/L — ABNORMAL HIGH (ref 22–32)
pCO2 arterial: 76.8 mmHg (ref 32.0–48.0)
pCO2 arterial: 71.6 mmHg (ref 32.0–48.0)
pH, Arterial: 7.322 — ABNORMAL LOW (ref 7.350–7.450)
pH, Arterial: 7.351 (ref 7.350–7.450)
pO2, Arterial: 58 mmHg — ABNORMAL LOW (ref 83.0–108.0)
pO2, Arterial: 60 mmHg — ABNORMAL LOW (ref 83.0–108.0)

## 2020-12-12 LAB — CULTURE, RESPIRATORY W GRAM STAIN

## 2020-12-12 LAB — TRIGLYCERIDES: Triglycerides: 139 mg/dL (ref <150)

## 2020-12-12 LAB — GLUCOSE, CAPILLARY
Glucose-Capillary: 182 mg/dL — ABNORMAL HIGH (ref 70–99)
Glucose-Capillary: 162 mg/dL — ABNORMAL HIGH (ref 70–99)
Glucose-Capillary: 208 mg/dL — ABNORMAL HIGH (ref 70–99)
Glucose-Capillary: 231 mg/dL — ABNORMAL HIGH (ref 70–99)
Glucose-Capillary: 215 mg/dL — ABNORMAL HIGH (ref 70–99)
Glucose-Capillary: 155 mg/dL — ABNORMAL HIGH (ref 70–99)

## 2020-12-12 LAB — ECHOCARDIOGRAM COMPLETE
Area-P 1/2: 5.31 cm2
Height: 70 in
S' Lateral: 3 cm
Weight: 3350.99 oz

## 2020-12-12 LAB — CBC WITH DIFFERENTIAL/PLATELET
Abs Immature Granulocytes: 0.12 10*3/uL — ABNORMAL HIGH (ref 0.00–0.07)
Basophils Absolute: 0 10*3/uL (ref 0.0–0.1)
Basophils Relative: 0 %
Eosinophils Absolute: 0 10*3/uL (ref 0.0–0.5)
Eosinophils Relative: 0 %
HCT: 42.9 % (ref 39.0–52.0)
Hemoglobin: 13.6 g/dL (ref 13.0–17.0)
Immature Granulocytes: 1 %
Lymphocytes Relative: 9 %
Lymphs Abs: 1 10*3/uL (ref 0.7–4.0)
MCH: 27.5 pg (ref 26.0–34.0)
MCHC: 31.7 g/dL (ref 30.0–36.0)
MCV: 86.7 fL (ref 80.0–100.0)
Monocytes Absolute: 1.3 10*3/uL — ABNORMAL HIGH (ref 0.1–1.0)
Monocytes Relative: 12 %
Neutro Abs: 8.8 10*3/uL — ABNORMAL HIGH (ref 1.7–7.7)
Neutrophils Relative %: 78 %
Platelets: 241 10*3/uL (ref 150–400)
RBC: 4.95 MIL/uL (ref 4.22–5.81)
RDW: 18.1 % — ABNORMAL HIGH (ref 11.5–15.5)
WBC: 11.2 10*3/uL — ABNORMAL HIGH (ref 4.0–10.5)
nRBC: 0.3 % — ABNORMAL HIGH (ref 0.0–0.2)

## 2020-12-12 LAB — COMPREHENSIVE METABOLIC PANEL
ALT: 18 U/L (ref 0–44)
AST: 16 U/L (ref 15–41)
Albumin: 2.3 g/dL — ABNORMAL LOW (ref 3.5–5.0)
Alkaline Phosphatase: 39 U/L (ref 38–126)
Anion gap: 4 — ABNORMAL LOW (ref 5–15)
BUN: 25 mg/dL — ABNORMAL HIGH (ref 8–23)
CO2: 37 mmol/L — ABNORMAL HIGH (ref 22–32)
Calcium: 8.7 mg/dL — ABNORMAL LOW (ref 8.9–10.3)
Chloride: 105 mmol/L (ref 98–111)
Creatinine, Ser: 0.87 mg/dL (ref 0.61–1.24)
GFR, Estimated: >60 mL/min (ref >60)
Glucose, Bld: 153 mg/dL — ABNORMAL HIGH (ref 70–99)
Potassium: 4 mmol/L (ref 3.5–5.1)
Sodium: 146 mmol/L — ABNORMAL HIGH (ref 135–145)
Total Bilirubin: 0.6 mg/dL (ref 0.3–1.2)
Total Protein: 5.3 g/dL — ABNORMAL LOW (ref 6.5–8.1)

## 2020-12-12 LAB — CULTURE, BLOOD (ROUTINE X 2): Special Requests: ADEQUATE

## 2020-12-12 LAB — MAGNESIUM: Magnesium: 2.3 mg/dL (ref 1.7–2.4)

## 2020-12-12 LAB — TROPONIN I (HIGH SENSITIVITY): Troponin I (High Sensitivity): 169 ng/L (ref <18)

## 2020-12-12 MED ORDER — ACETAMINOPHEN 500 MG PO TABS
1000.0000 mg | ORAL_TABLET | Freq: Four times a day (QID) | ORAL | Status: DC | PRN
  Administered 2020-12-12 – 2020-12-17 (×6): 1000 mg
  Filled 2020-12-12 (×7): qty 2

## 2020-12-12 MED ORDER — FUROSEMIDE 10 MG/ML IJ SOLN
20.0000 mg | Freq: Four times a day (QID) | INTRAMUSCULAR | Status: AC
  Administered 2020-12-12 (×2): 20 mg via INTRAVENOUS
  Filled 2020-12-12 (×2): qty 2

## 2020-12-12 NOTE — Progress Notes (Signed)
Wife updated via phone on patient status and plan of care. Discussed worsening respiratory settings on ventilator. All questions answered at this time.

## 2020-12-12 NOTE — Progress Notes (Signed)
Attending:    Subjective: Worsening oxygenation Still has frothy secretions PEEP increased  Fever overnight  Objective: Vitals:   12/12/20 0700 12/12/20 0758 12/12/20 0801 12/12/20 0803  BP: 116/73     Pulse: (!) 107     Resp: 17     Temp: 99 F (37.2 C)     TempSrc: Oral     SpO2: 91% (!) 89% 90% 91%  Weight:      Height:       Vent Mode: PRVC FiO2 (%):  [70 %-80 %] 80 % Set Rate:  [14 bmp-20 bmp] 20 bmp Vt Set:  [510 mL] 510 mL PEEP:  [8 cmH20-10 cmH20] 10 cmH20 Plateau Pressure:  [16 cmH20-22 cmH20] 22 cmH20  Intake/Output Summary (Last 24 hours) at 12/12/2020 0918 Last data filed at 12/12/2020 0803 Gross per 24 hour  Intake 2149.83 ml  Output 4840 ml  Net -2690.17 ml    General:  In bed on vent HENT: NCAT ETT in place PULM: CTA B, vent supported breathing CV: RRR, no mgr GI: BS+, soft, nontender MSK: normal bulk and tone Neuro: sedated on vent    CBC    Component Value Date/Time   WBC 11.2 (H) 12/12/2020 0306   RBC 4.95 12/12/2020 0306   HGB 14.3 12/12/2020 0418   HCT 42.0 12/12/2020 0418   PLT 241 12/12/2020 0306   MCV 86.7 12/12/2020 0306   MCH 27.5 12/12/2020 0306   MCHC 31.7 12/12/2020 0306   RDW 18.1 (H) 12/12/2020 0306   LYMPHSABS 1.0 12/12/2020 0306   MONOABS 1.3 (H) 12/12/2020 0306   EOSABS 0.0 12/12/2020 0306   BASOSABS 0.0 12/12/2020 0306    BMET    Component Value Date/Time   NA 145 12/12/2020 0418   K 3.9 12/12/2020 0418   CL 105 12/12/2020 0306   CO2 37 (H) 12/12/2020 0306   GLUCOSE 153 (H) 12/12/2020 0306   BUN 25 (H) 12/12/2020 0306   CREATININE 0.87 12/12/2020 0306   CALCIUM 8.7 (L) 12/12/2020 0306   GFRNONAA >60 12/12/2020 0306   GFRAA >60 11/11/2019 0234   ABG    Component Value Date/Time   PHART 7.322 (L) 12/12/2020 0418   PCO2ART 76.8 (HH) 12/12/2020 0418   PO2ART 58 (L) 12/12/2020 0418   HCO3 39.5 (H) 12/12/2020 0418   TCO2 42 (H) 12/12/2020 0418   ACIDBASEDEF 3.0 (H) 06/22/2008 1551   O2SAT 85.0 12/12/2020  0418    CXR images none today  Impression/Plan: Severe acute respiratory failure with hypoxemia> worsening oxygenation due to acute pulmonary edema: increase PEEP today, continue full vent support, repeat abg, continue vap prevention Pseudomonas pneumonia> continue ceftaz 7 days Acute pulmonary edema> check echo today, troponin, repeat lasix again today Sedation needs for mechanical ventilation: PAD protocl, RASS target 0 to -1, fentanyl infusion DM2 : glargine and SSI COPD: continue bronchodilators and prednisone '40mg'$   Wife updated at length yesterday, his prognosis is guarded   My cc time 31 minutes  Roselie Awkward, MD [place] [facility] Pager: [phone] Cell: [phone] After 7pm: [phone]

## 2020-12-12 NOTE — Progress Notes (Signed)
[place] Progress Note Patient Name: [name] DOB: 1951/04/14 MRN: [id number]   Date of Service  12/12/2020  HPI/Events of Note  ABG on 70%/PRVC 16/TV 510/P 8 = 7.32/76.8/58/39.5, Sat by oximetry = 91%.  eICU Interventions  Continue current ventilator management.     Intervention Category Major Interventions: Respiratory failure - evaluation and management  Kaoru Rezendes Eugene 12/12/2020, 6:38 AM

## 2020-12-12 NOTE — Progress Notes (Signed)
  Echocardiogram 2D Echocardiogram has been performed.  [name] 12/12/2020, 2:50 PM

## 2020-12-13 ENCOUNTER — Inpatient Hospital Stay (HOSPITAL_COMMUNITY): Payer: Medicare Other

## 2020-12-13 DIAGNOSIS — J9601 Acute respiratory failure with hypoxia: Secondary | ICD-10-CM | POA: Diagnosis not present

## 2020-12-13 DIAGNOSIS — J189 Pneumonia, unspecified organism: Secondary | ICD-10-CM | POA: Diagnosis not present

## 2020-12-13 DIAGNOSIS — J441 Chronic obstructive pulmonary disease with (acute) exacerbation: Secondary | ICD-10-CM | POA: Diagnosis not present

## 2020-12-13 DIAGNOSIS — J151 Pneumonia due to Pseudomonas: Secondary | ICD-10-CM

## 2020-12-13 DIAGNOSIS — R062 Wheezing: Secondary | ICD-10-CM

## 2020-12-13 DIAGNOSIS — J9602 Acute respiratory failure with hypercapnia: Secondary | ICD-10-CM | POA: Diagnosis not present

## 2020-12-13 LAB — COMPREHENSIVE METABOLIC PANEL
ALT: 20 U/L (ref 0–44)
ALT: 20 U/L (ref 0–44)
AST: 18 U/L (ref 15–41)
AST: 17 U/L (ref 15–41)
Albumin: 2.3 g/dL — ABNORMAL LOW (ref 3.5–5.0)
Albumin: 2.3 g/dL — ABNORMAL LOW (ref 3.5–5.0)
Alkaline Phosphatase: 40 U/L (ref 38–126)
Alkaline Phosphatase: 40 U/L (ref 38–126)
Anion gap: 6 (ref 5–15)
Anion gap: 12 (ref 5–15)
BUN: 30 mg/dL — ABNORMAL HIGH (ref 8–23)
BUN: 32 mg/dL — ABNORMAL HIGH (ref 8–23)
CO2: 37 mmol/L — ABNORMAL HIGH (ref 22–32)
CO2: 34 mmol/L — ABNORMAL HIGH (ref 22–32)
Calcium: 9.1 mg/dL (ref 8.9–10.3)
Calcium: 9 mg/dL (ref 8.9–10.3)
Chloride: 105 mmol/L (ref 98–111)
Chloride: 102 mmol/L (ref 98–111)
Creatinine, Ser: 0.95 mg/dL (ref 0.61–1.24)
Creatinine, Ser: 0.99 mg/dL (ref 0.61–1.24)
GFR, Estimated: >60 mL/min (ref >60)
GFR, Estimated: >60 mL/min (ref >60)
Glucose, Bld: 163 mg/dL — ABNORMAL HIGH (ref 70–99)
Glucose, Bld: 268 mg/dL — ABNORMAL HIGH (ref 70–99)
Potassium: 3.9 mmol/L (ref 3.5–5.1)
Potassium: 3.7 mmol/L (ref 3.5–5.1)
Sodium: 148 mmol/L — ABNORMAL HIGH (ref 135–145)
Sodium: 148 mmol/L — ABNORMAL HIGH (ref 135–145)
Total Bilirubin: 0.6 mg/dL (ref 0.3–1.2)
Total Bilirubin: 0.7 mg/dL (ref 0.3–1.2)
Total Protein: 5.5 g/dL — ABNORMAL LOW (ref 6.5–8.1)
Total Protein: 5.9 g/dL — ABNORMAL LOW (ref 6.5–8.1)

## 2020-12-13 LAB — CBC WITH DIFFERENTIAL/PLATELET
Abs Immature Granulocytes: 0.17 10*3/uL — ABNORMAL HIGH (ref 0.00–0.07)
Basophils Absolute: 0 10*3/uL (ref 0.0–0.1)
Basophils Relative: 0 %
Eosinophils Absolute: 0 10*3/uL (ref 0.0–0.5)
Eosinophils Relative: 0 %
HCT: 42 % (ref 39.0–52.0)
Hemoglobin: 13.2 g/dL (ref 13.0–17.0)
Immature Granulocytes: 1 %
Lymphocytes Relative: 11 %
Lymphs Abs: 1.3 10*3/uL (ref 0.7–4.0)
MCH: 27.2 pg (ref 26.0–34.0)
MCHC: 31.4 g/dL (ref 30.0–36.0)
MCV: 86.4 fL (ref 80.0–100.0)
Monocytes Absolute: 1.2 10*3/uL — ABNORMAL HIGH (ref 0.1–1.0)
Monocytes Relative: 10 %
Neutro Abs: 9.1 10*3/uL — ABNORMAL HIGH (ref 1.7–7.7)
Neutrophils Relative %: 78 %
Platelets: 245 10*3/uL (ref 150–400)
RBC: 4.86 MIL/uL (ref 4.22–5.81)
RDW: 18.4 % — ABNORMAL HIGH (ref 11.5–15.5)
WBC: 11.8 10*3/uL — ABNORMAL HIGH (ref 4.0–10.5)
nRBC: 0.2 % (ref 0.0–0.2)

## 2020-12-13 LAB — GLUCOSE, CAPILLARY
Glucose-Capillary: 159 mg/dL — ABNORMAL HIGH (ref 70–99)
Glucose-Capillary: 187 mg/dL — ABNORMAL HIGH (ref 70–99)
Glucose-Capillary: 227 mg/dL — ABNORMAL HIGH (ref 70–99)
Glucose-Capillary: 249 mg/dL — ABNORMAL HIGH (ref 70–99)
Glucose-Capillary: 217 mg/dL — ABNORMAL HIGH (ref 70–99)
Glucose-Capillary: 208 mg/dL — ABNORMAL HIGH (ref 70–99)

## 2020-12-13 LAB — URINE CULTURE: Culture: 4000 — AB

## 2020-12-13 LAB — CULTURE, BLOOD (ROUTINE X 2): Culture: NO GROWTH

## 2020-12-13 LAB — MAGNESIUM: Magnesium: 2.4 mg/dL (ref 1.7–2.4)

## 2020-12-13 MED ORDER — FUROSEMIDE 10 MG/ML IJ SOLN
80.0000 mg | Freq: Once | INTRAMUSCULAR | Status: AC
  Administered 2020-12-13: 80 mg via INTRAVENOUS
  Filled 2020-12-13: qty 8

## 2020-12-13 MED ORDER — MIDAZOLAM HCL 2 MG/2ML IJ SOLN
2.0000 mg | Freq: Once | INTRAMUSCULAR | Status: AC
  Administered 2020-12-13: 2 mg via INTRAVENOUS
  Filled 2020-12-13: qty 2

## 2020-12-13 MED ORDER — INSULIN GLARGINE-YFGN 100 UNIT/ML ~~LOC~~ SOLN
14.0000 [IU] | Freq: Every day | SUBCUTANEOUS | Status: DC
  Administered 2020-12-14: 14 [IU] via SUBCUTANEOUS
  Filled 2020-12-13 (×2): qty 0.14

## 2020-12-13 MED ORDER — IPRATROPIUM-ALBUTEROL 0.5-2.5 (3) MG/3ML IN SOLN
3.0000 mL | RESPIRATORY_TRACT | Status: DC
  Administered 2020-12-13 – 2020-12-17 (×23): 3 mL via RESPIRATORY_TRACT
  Filled 2020-12-13 (×15): qty 3
  Filled 2020-12-13: qty 39
  Filled 2020-12-13 (×7): qty 3

## 2020-12-13 MED ORDER — SENNOSIDES 8.8 MG/5ML PO SYRP
15.0000 mL | ORAL_SOLUTION | Freq: Two times a day (BID) | ORAL | Status: DC
  Administered 2020-12-13 – 2020-12-16 (×8): 15 mL
  Filled 2020-12-13 (×9): qty 15

## 2020-12-13 NOTE — Progress Notes (Signed)
NAME:  [name], MRN:  [id number], DOB:  10/18/1950, LOS: 5 ADMISSION DATE:  12/05/2020 CONSULTATION DATE:  12/04/2020 REFERRING MD:  Lavenia Atlas CHIEF COMPLAINT:  SOB, hypercapnic/hypoxic respiratory failure   History of Present Illness:  70 year old man with PMHx significant for HTN, HLD, CAD (4-vessel), OSA (on CPAP), COPD (GOLD stage III, on 4LNC home O2) who presented to [facility] ED 7/25 via EMS for SOB.   At home, hypoxic on [facility] with sats 70%. Transitioned to NRB then CPAP. DuoNeb and Solumedrol administered in the field. Initially GCS 14, quickly declining to GCS 3 in ED. Patient subsequently intubated in ED for hypoxia and AMS. ABG showed pH 7.294/pCO2 64.6/pO2 372/Bicarb 31.6. Patient became progressively hypotensive post-intubation requiring Levophed initiation and CVL placement by ED.  PCCM consulted for admission.  Pertinent Medical History:  Four-vessel coronary artery disease, hypertension, hyperlipidemia, OSA on CPAP, Gold stage III COPD with resultant chronic respiratory failure on 4 L baseline supplemental oxygen.  Anxiety, depression, diabetes, gastritis.  GERD, hyperlipidemia, hypertension.  Significant Hospital Events: Including procedures, antibiotic start and stop dates in addition to other pertinent events   7/25 - BIB EMS for SOB, hypoxic/hypercapnic respiratory failure. Initially maintained on CPAP, became progressively altered (GCS 3) requiring intubation. PCCM consulted for admission.  Hypotensive following intubation, right IJ triple-lumen catheter placed by ER.  Started on norepinephrine.  IV fluids administered.  From pulmonary standpoint in addition to mechanical ventilation therapeutic interventions included: Scheduled bronchodilators, IV systemic steroids, and ceftriaxone with azithromycin.  Respiratory culture sent. 7/26: Heavily sedated, apparently was agitated overnight.  Still requiring norepinephrine infusion.,  Lactate actually trending up instead of  down.  Bleeding from right IJ triple-lumen catheter. Suture placed. 7/27 weaned OFF norepi  7/28 CXR worse. Got lasix abx changed to Ceftazidime as cultures back and growing Pseudomonas (note UC also + w/ pseudomonas: but only 4K) 7/29 echo ordered. EF 60-65%, no WMA, mild concentric hypertrophy, RV fxn nml.  7/30: Increasing diuretics.  Hemodynamically stable.  Removing Foley catheter after diuresis.  Still on high PEEP/FiO2.  Increasing frequency of bronchodilators. Interim History / Subjective:  No sig improvement  Objective:  Blood pressure 130/85, pulse 93, temperature 99.86 F (37.7 C), resp. rate 20, height '5\' 10"'$  (1.778 m), weight 94.5 kg, SpO2 97 %.    Vent Mode: PRVC FiO2 (%):  [80 %-100 %] 80 % Set Rate:  [20 bmp] 20 bmp Vt Set:  [510 mL] 510 mL PEEP:  [10 cmH20-12 cmH20] 12 cmH20 Plateau Pressure:  [14 cmH20-22 cmH20] 22 cmH20   Intake/Output Summary (Last 24 hours) at 12/13/2020 1022 Last data filed at 12/13/2020 0800 Gross per 24 hour  Intake 1520.93 ml  Output 2555 ml  Net -1034.07 ml   Filed Weights   12/11/20 0328 12/12/20 0104 12/13/20 0341  Weight: 97.5 kg 95 kg 94.5 kg    Physical Examination:  General: Chronically ill-appearing 70 year old male he remains on full ventilator support HEENT orally intubated sclera nonicteric mucous membranes moist Pulmonary: Diminished throughout, some air trapping noted.  Faint expiratory wheeze.  Currently on 80 FiO2 and high PEEP Cardiac: Regular rate and rhythm Abdomen: Soft nontender Extremities: Warm dry palpable pulses diffuse anasarca Neuro: Eyes are open but not following commands, not interactive but starting to track GU: Clear yellow.  Resolved Hospital Problem List   Lactic acidosis  Septic shock resolved Assessment & Plan:  Acute-on-chronic hypoxemic/hypercapnic respiratory failure in the setting of acute exacerbation chronic obstructive pulmonary disease and pseudomonas PNA  History  of COPD, GOLD stage III  on 4L [place] baseline PCXR from 7/29 diffuse bilateral airspace disease.  Plan Continuing full ventilator support, titrating PEEP/FiO2 per protocol Escalate IV diuresis as long as BUN/creatinine and blood pressure allow Changing frequency of bronchodilators, will make more frequent Continue current dose of prednisone Day #3 ceftazidime VAP bundle PAD protocol, RASS goal   Fluid and electrolyte imbalance: Hypernatremia Plan Free water replacement Serial chems   History of HTN, four-vessel coronary artery disease, and hyperlipidemia. Had trop I bump but EF good and no WM abnormality on echo Plan Cont plavix, asa and pravastatin  Cont tele    Type 2 diabetes mellitus, Currently with steroid-induced hyperglycemia. Plan Cont resistent ssi  Cont lantus bt will increase to 14 Cont to reduce steroid  Acute metabolic encephalopathy History of anxiety and depression Plan Cont  neurontin PAD protocol as above   Best Practice (right click and "Reselect all SmartList Selections" daily)   Diet/type: tubefeeds DVT prophylaxis: LMWH GI prophylaxis: PPI Lines: Central line Foley:  removal ordered  Code Status:  full code Last date of multidisciplinary goals of care discussion [7/29]    Critical care time: [place] ACNP-BC [place] Pager # [phone] OR # [phone] if no answer

## 2020-12-13 NOTE — Progress Notes (Signed)
NP, RT and RN in room for severe mucous expulsion in vent.  Per NP give boluses of sedation medication and increase gtt rate. Maintain RASS -3 to support ventilator settings.

## 2020-12-14 ENCOUNTER — Inpatient Hospital Stay (HOSPITAL_COMMUNITY): Payer: Medicare Other

## 2020-12-14 DIAGNOSIS — J9601 Acute respiratory failure with hypoxia: Secondary | ICD-10-CM | POA: Diagnosis not present

## 2020-12-14 DIAGNOSIS — J9602 Acute respiratory failure with hypercapnia: Secondary | ICD-10-CM | POA: Diagnosis not present

## 2020-12-14 DIAGNOSIS — Z9289 Personal history of other medical treatment: Secondary | ICD-10-CM | POA: Diagnosis not present

## 2020-12-14 DIAGNOSIS — J441 Chronic obstructive pulmonary disease with (acute) exacerbation: Secondary | ICD-10-CM | POA: Diagnosis not present

## 2020-12-14 LAB — COMPREHENSIVE METABOLIC PANEL
ALT: 20 U/L (ref 0–44)
ALT: 21 U/L (ref 0–44)
AST: 19 U/L (ref 15–41)
AST: 20 U/L (ref 15–41)
Albumin: 2.2 g/dL — ABNORMAL LOW (ref 3.5–5.0)
Albumin: 2.3 g/dL — ABNORMAL LOW (ref 3.5–5.0)
Alkaline Phosphatase: 46 U/L (ref 38–126)
Alkaline Phosphatase: 45 U/L (ref 38–126)
Anion gap: 6 (ref 5–15)
Anion gap: 9 (ref 5–15)
BUN: 35 mg/dL — ABNORMAL HIGH (ref 8–23)
BUN: 37 mg/dL — ABNORMAL HIGH (ref 8–23)
CO2: 39 mmol/L — ABNORMAL HIGH (ref 22–32)
CO2: 39 mmol/L — ABNORMAL HIGH (ref 22–32)
Calcium: 9.1 mg/dL (ref 8.9–10.3)
Calcium: 9.2 mg/dL (ref 8.9–10.3)
Chloride: 102 mmol/L (ref 98–111)
Chloride: 101 mmol/L (ref 98–111)
Creatinine, Ser: 0.89 mg/dL (ref 0.61–1.24)
Creatinine, Ser: 1.02 mg/dL (ref 0.61–1.24)
GFR, Estimated: >60 mL/min (ref >60)
GFR, Estimated: >60 mL/min (ref >60)
Glucose, Bld: 280 mg/dL — ABNORMAL HIGH (ref 70–99)
Glucose, Bld: 291 mg/dL — ABNORMAL HIGH (ref 70–99)
Potassium: 4.5 mmol/L (ref 3.5–5.1)
Potassium: 4.2 mmol/L (ref 3.5–5.1)
Sodium: 147 mmol/L — ABNORMAL HIGH (ref 135–145)
Sodium: 149 mmol/L — ABNORMAL HIGH (ref 135–145)
Total Bilirubin: 0.6 mg/dL (ref 0.3–1.2)
Total Bilirubin: 1 mg/dL (ref 0.3–1.2)
Total Protein: 5.8 g/dL — ABNORMAL LOW (ref 6.5–8.1)
Total Protein: 5.9 g/dL — ABNORMAL LOW (ref 6.5–8.1)

## 2020-12-14 LAB — GLUCOSE, CAPILLARY
Glucose-Capillary: 205 mg/dL — ABNORMAL HIGH (ref 70–99)
Glucose-Capillary: 157 mg/dL — ABNORMAL HIGH (ref 70–99)
Glucose-Capillary: 237 mg/dL — ABNORMAL HIGH (ref 70–99)
Glucose-Capillary: 277 mg/dL — ABNORMAL HIGH (ref 70–99)
Glucose-Capillary: 245 mg/dL — ABNORMAL HIGH (ref 70–99)
Glucose-Capillary: 212 mg/dL — ABNORMAL HIGH (ref 70–99)

## 2020-12-14 LAB — MAGNESIUM: Magnesium: 2.6 mg/dL — ABNORMAL HIGH (ref 1.7–2.4)

## 2020-12-14 LAB — BASIC METABOLIC PANEL
Anion gap: 7 (ref 5–15)
BUN: 35 mg/dL — ABNORMAL HIGH (ref 8–23)
CO2: 40 mmol/L — ABNORMAL HIGH (ref 22–32)
Calcium: 9.3 mg/dL (ref 8.9–10.3)
Chloride: 104 mmol/L (ref 98–111)
Creatinine, Ser: 0.89 mg/dL (ref 0.61–1.24)
GFR, Estimated: >60 mL/min (ref >60)
Glucose, Bld: 182 mg/dL — ABNORMAL HIGH (ref 70–99)
Potassium: 3.6 mmol/L (ref 3.5–5.1)
Sodium: 151 mmol/L — ABNORMAL HIGH (ref 135–145)

## 2020-12-14 LAB — CBC
HCT: 39.9 % (ref 39.0–52.0)
Hemoglobin: 12.8 g/dL — ABNORMAL LOW (ref 13.0–17.0)
MCH: 27.5 pg (ref 26.0–34.0)
MCHC: 32.1 g/dL (ref 30.0–36.0)
MCV: 85.8 fL (ref 80.0–100.0)
Platelets: 254 10*3/uL (ref 150–400)
RBC: 4.65 MIL/uL (ref 4.22–5.81)
RDW: 18.5 % — ABNORMAL HIGH (ref 11.5–15.5)
WBC: 10.9 10*3/uL — ABNORMAL HIGH (ref 4.0–10.5)
nRBC: 0.3 % — ABNORMAL HIGH (ref 0.0–0.2)

## 2020-12-14 MED ORDER — FUROSEMIDE 10 MG/ML IJ SOLN
80.0000 mg | Freq: Two times a day (BID) | INTRAMUSCULAR | Status: DC
  Administered 2020-12-14 (×2): 80 mg via INTRAVENOUS
  Filled 2020-12-14 (×2): qty 8

## 2020-12-14 MED ORDER — POLYETHYLENE GLYCOL 3350 17 G PO PACK: 17.0000 g | PACK | Freq: Every day | ORAL | Status: DC | PRN

## 2020-12-14 MED ORDER — ENOXAPARIN SODIUM 40 MG/0.4ML IJ SOSY
40.0000 mg | PREFILLED_SYRINGE | INTRAMUSCULAR | Status: DC
  Administered 2020-12-15 – 2020-12-16 (×2): 40 mg via SUBCUTANEOUS
  Filled 2020-12-14 (×3): qty 0.4

## 2020-12-14 MED ORDER — METHYLPREDNISOLONE SODIUM SUCC 125 MG IJ SOLR
40.0000 mg | Freq: Two times a day (BID) | INTRAMUSCULAR | Status: DC
  Administered 2020-12-14 (×2): 40 mg via INTRAVENOUS
  Filled 2020-12-14 (×2): qty 2

## 2020-12-14 MED ORDER — DOCUSATE SODIUM 50 MG/5ML PO LIQD
100.0000 mg | Freq: Two times a day (BID) | ORAL | Status: DC | PRN
  Administered 2020-12-17: 100 mg
  Filled 2020-12-14: qty 10

## 2020-12-14 MED ORDER — FREE WATER
200.0000 mL | Status: DC
  Administered 2020-12-14 – 2020-12-16 (×11): 200 mL

## 2020-12-14 MED ORDER — POTASSIUM CHLORIDE 10 MEQ/50ML IV SOLN
10.0000 meq | INTRAVENOUS | Status: AC
  Administered 2020-12-14 (×4): 10 meq via INTRAVENOUS
  Filled 2020-12-14 (×4): qty 50

## 2020-12-14 NOTE — Progress Notes (Addendum)
NAME:  [name], MRN:  [id number], DOB:  05-13-1951, LOS: 6 ADMISSION DATE:  11/23/2020 CONSULTATION DATE:  12/10/2020 REFERRING MD:  Lavenia Atlas CHIEF COMPLAINT:  SOB, hypercapnic/hypoxic respiratory failure   History of Present Illness:  70 year old man with PMHx significant for HTN, HLD, CAD (4-vessel), OSA (on CPAP), COPD (GOLD stage III, on 4LNC home O2) who presented to [facility] ED 7/25 via EMS for SOB.   At home, hypoxic on [facility] with sats 70%. Transitioned to NRB then CPAP. DuoNeb and Solumedrol administered in the field. Initially GCS 14, quickly declining to GCS 3 in ED. Patient subsequently intubated in ED for hypoxia and AMS. ABG showed pH 7.294/pCO2 64.6/pO2 372/Bicarb 31.6. Patient became progressively hypotensive post-intubation requiring Levophed initiation and CVL placement by ED.  PCCM consulted for admission.  Pertinent Medical History:  Four-vessel coronary artery disease, hypertension, hyperlipidemia, OSA on CPAP, Gold stage III COPD with resultant chronic respiratory failure on 4 L baseline supplemental oxygen.  Anxiety, depression, diabetes, gastritis.  GERD, hyperlipidemia, hypertension.  Significant Hospital Events: Including procedures, antibiotic start and stop dates in addition to other pertinent events   7/25 - BIB EMS for SOB, hypoxic/hypercapnic respiratory failure. Initially maintained on CPAP, became progressively altered (GCS 3) requiring intubation. PCCM consulted for admission.  Hypotensive following intubation, right IJ triple-lumen catheter placed by ER.  Started on norepinephrine.  IV fluids administered.  From pulmonary standpoint in addition to mechanical ventilation therapeutic interventions included: Scheduled bronchodilators, IV systemic steroids, and ceftriaxone with azithromycin.  Respiratory culture sent. 7/26: Heavily sedated, apparently was agitated overnight.  Still requiring norepinephrine infusion.,  Lactate actually trending up instead of  down.  Bleeding from right IJ triple-lumen catheter. Suture placed. 7/27 weaned OFF norepi  7/28 CXR worse. Got lasix abx changed to Ceftazidime as cultures back and growing Pseudomonas (note UC also + w/ pseudomonas: but only 4K) 7/29 echo ordered. EF 60-65%, no WMA, mild concentric hypertrophy, RV fxn nml.  7/30: Increasing diuretics.  Hemodynamically stable.  Removing Foley catheter after diuresis.  Still on high PEEP/FiO2.  Increasing frequency of bronchodilators. 7/31: Changed prednisone back to IV steroids.  Repeated sputum culture.  Spoke to wife, made aware of poor prognosis.  Palliative care planning on meeting later today.  Sodium climbing.  Free water replacement added. Interim History / Subjective:  Still critically ill. O2 needs higher.  Objective:  Blood pressure 104/71, pulse (Abnormal) 107, temperature 98.7 F (37.1 C), temperature source Oral, resp. rate 20, height '5\' 10"'$  (1.778 m), weight 94.2 kg, SpO2 93 %.    Vent Mode: PRVC FiO2 (%):  [90 %] 90 % Set Rate:  [20 bmp] 20 bmp Vt Set:  [510 mL] 510 mL PEEP:  [12 cmH20] 12 cmH20 Plateau Pressure:  [20 cmH20-26 cmH20] 24 cmH20   Intake/Output Summary (Last 24 hours) at 12/14/2020 1114 Last data filed at 12/14/2020 0600 Gross per 24 hour  Intake 1636.59 ml  Output 2950 ml  Net -1313.41 ml   Filed Weights   12/12/20 0104 12/13/20 0341 12/14/20 0325  Weight: 95 kg 94.5 kg 94.2 kg    Physical Examination:  General: 70 year old male patient who remains ventilator dependent HEENT normocephalic atraumatic right IJ triple-lumen catheter in place no JVD Pulmonary: Expiratory wheezing, diminished throughout.  Now on 90% FiO2 ventilator requirements worse Cardiac: Regular rate and rhythm Abdomen: Soft nontender Extremities: Diffuse anasarca GU: Clear yellow urine Neuro: Heavily sedated  Resolved Hospital Problem List   Lactic acidosis  Septic shock resolved Assessment &  Plan:  Acute-on-chronic hypoxemic/hypercapnic  respiratory failure in the setting of acute exacerbation chronic obstructive pulmonary disease and pseudomonas PNA  History of COPD, GOLD stage III on 4L [place] baseline PCXR from 7/29 diffuse bilateral airspace disease.  Plan Continue full ventilator support Continue to wean PEEP/FiO2 Continue scheduled bronchodilators Continue systemic steroids Day #4 ceftazidime VAP bundle Repeat chest x-ray now PAD protocol RASS goal -3 Repeat sputum  Cont IV lasix   Fluid and electrolyte imbalance: Hypernatremia--> Plan Increase free water replacement  Q 12 chem   History of HTN, four-vessel coronary artery disease, and hyperlipidemia. Had trop I bump but EF good and no WM abnormality on echo Plan Cont plavix and pravastatin  Cont tele   Type 2 diabetes mellitus, Currently with steroid-induced hyperglycemia. Plan Cont resistant ssi Cont lantus; increase to 16 units as going back up on steroids,.    Acute metabolic encephalopathy History of anxiety and depression Plan PAD as above Cont Neurontin   Best Practice (right click and "Reselect all SmartList Selections" daily)   Diet/type: tubefeeds DVT prophylaxis: LMWH GI prophylaxis: PPI Lines: Central line Foley:  Yes, and it is still needed Code Status:  full code Last date of multidisciplinary goals of care discussion [7/29]    Critical care time: [place] ACNP-BC [place] Pager # [phone] OR # [phone] if no answer  Attending:    Subjective: Worsening oxygenation Making more mucus this morning Staph bacteremia  Objective: Vitals:   12/14/20 0600 12/14/20 0700 12/14/20 0800 12/14/20 1100  BP: 115/72  104/71   Pulse: (!) 106  (!) 107   Resp: (!) 26  20   Temp:  98.7 F (37.1 C)  99 F (37.2 C)  TempSrc:  Oral  Oral  SpO2: 92%  93%   Weight:      Height:       Vent Mode: PRVC FiO2 (%):  [90 %] 90 % Set Rate:  [20 bmp] 20 bmp Vt Set:  [510 mL] 510 mL PEEP:  [12 cmH20] 12  cmH20 Plateau Pressure:  [21 cmH20-26 cmH20] 24 cmH20  Intake/Output Summary (Last 24 hours) at 12/14/2020 1442 Last data filed at 12/14/2020 0600 Gross per 24 hour  Intake 1433.78 ml  Output 1150 ml  Net 283.78 ml    General:  In bed on vent HENT: NCAT ETT in place PULM: Rhonchi bilaterally vent supported breathing CV: RRR, no mgr GI: BS+, soft, nontender MSK: normal bulk and tone Neuro: sedated on vent    CBC    Component Value Date/Time   WBC 10.9 (H) 12/14/2020 0209   RBC 4.65 12/14/2020 0209   HGB 12.8 (L) 12/14/2020 0209   HCT 39.9 12/14/2020 0209   PLT 254 12/14/2020 0209   MCV 85.8 12/14/2020 0209   MCH 27.5 12/14/2020 0209   MCHC 32.1 12/14/2020 0209   RDW 18.5 (H) 12/14/2020 0209   LYMPHSABS 1.3 12/13/2020 0344   MONOABS 1.2 (H) 12/13/2020 0344   EOSABS 0.0 12/13/2020 0344   BASOSABS 0.0 12/13/2020 0344    BMET    Component Value Date/Time   NA 147 (H) 12/14/2020 1145   K 4.5 12/14/2020 1145   CL 102 12/14/2020 1145   CO2 39 (H) 12/14/2020 1145   GLUCOSE 280 (H) 12/14/2020 1145   BUN 35 (H) 12/14/2020 1145   CREATININE 0.89 12/14/2020 1145   CALCIUM 9.1 12/14/2020 1145   GFRNONAA >60 12/14/2020 1145   GFRAA >60 11/11/2019 0234  CXR images personally reviewed bilateral airspace disease in bases bilaterally, ett in place  Impression/Plan: 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  THey do not want him to have a tracheostomy or receive chest compressions or shocks in the event of a cardiac arrest.   They have requested DNR code status.  They would like to see how he does over the next few days before making a decision for comfort measures, but she understands they will have to do that.  They are hoping we will find something treatable from today's respiratory culture.  I advised it is unlikely that he will survive even if we identify something new from an infectious standpoint.  My cc time 30 minutes  Roselie Awkward, MD [place] [facility] Pager: [phone] Cell: [phone] After 7pm: [phone]

## 2020-12-14 NOTE — Progress Notes (Signed)
eLink Physician-Brief Progress Note Patient Name: [name] DOB: Mar 02, 1951 MRN: [id number]   Date of Service  12/14/2020  HPI/Events of Note  K is 3.6, creat 0.8, mag 2.6  eICU Interventions  E link electrolyte protocol ordered     Intervention Category Major Interventions: Electrolyte abnormality - evaluation and management  Margaretmary Lombard 12/14/2020, 3:47 AM

## 2020-12-14 NOTE — Consult Note (Signed)
Consultation Note Date: 12/14/2020   Patient Name: [name]  DOB: [birth year]  MRN: [id number]  Age / Sex: 70 y.o., male  PCP: Edward Moron, MD Referring Physician: Juanito Doom, MD  Reason for Consultation: Establishing goals of care  HPI/Patient Profile: 70 y.o. male  with past medical history of HTN, HLD, CAD (4-vessel), OSA (on CPAP), COPD (GOLD stage III, on 4LNC home O2), DM2, GERD admitted on 11/22/2020 with shortness of breath, hypoxic/hypercapnic respiratory failure, severe pseudomonas bilateral pneumonia.  In the ED, patient was intubated for hypoxia and AMS. Patient has required full vent support and heavy sedation for vent dyssynchrony. Palliative medicine has been consulted assist with goals of care conversation.  Clinical Assessment and Goals of Care:  I have reviewed medical records including EPIC notes, labs and imaging, received report from RN, assessed the patient and then met at the bedside along with patient's wife [name] with his daughter [name], son [name], and granddaughter [name] via telephone to discuss diagnosis prognosis, GOC, EOL wishes, disposition and options.  I introduced Palliative Medicine as specialized medical care for people living with serious illness. It focuses on providing relief from the symptoms and stress of a serious illness. The goal is to improve quality of life for both the patient and the family.  We discussed a brief life review of the patient and then focused on their current illness. The natural disease trajectory and expectations at EOL were discussed. Patient's family reflect on his loving personality and the joy his family brings him. He enjoys music, art, computers, prayer, and a home-cooked meal. He has cherished the last 11 years of marriage with [name]. Patient's daughter [name] shares that during conversations with her father, he has  expressed concern for [name]'s well-being when it was time for his death and she is left on her own. He felt like he has been a burden during the years that his health has steadily declined. [name] confirms that she has been his caregiver 24/7 as he has needed more assistance with all ADLs. She is tearful and feels that the patient would want to live if he was able to participate in our discussions. We reviewed the importance of considering what "living" means to each person. Patient's children express that he would certainly not desire to continue existing while fully dependence on artificial support. He would not want to prolong the dying process if he was unable to have meaningful interactions with his family. We discussed patient's worsening condition despite maximal medical interventions.   I attempted to elicit values and goals of care important to the patient.   [name], who is a SLP, bring forth the important value of honoring the patient's wishes and not "being selfish" when making difficult decisions about treatment options. [name] also voices his priority of thinking of the patients needs first, even though it may be difficult to compassionately liberate him from the ventilator. The family values ongoing discussions and a trial period of intubation, but they agree that  the family would not want a trach/PEG.  The difference between aggressive medical intervention and comfort care was considered in light of the patient's goals of care.   Advanced directives, concepts specific to code status, artifical feeding and hydration, and rehospitalization were considered and discussed.  Hospice and Palliative Care services outpatient were explained and offered.  Discussed the importance of continued conversation with family and the medical providers regarding overall plan of care and treatment options, ensuring decisions are within the context of the patient's values and GOCs.    Questions  and concerns were addressed.  Hard Choices booklet left for review. The family was encouraged to call with questions or concerns.  PMT will continue to support holistically.   NEXT OF KIN is patient's wife [name]. No HCPOA on file.    SUMMARY OF RECOMMENDATIONS   -Family has requested DNR code status. Orders entered for partial code (continue mechanical ventilation) but NO CPR/defibrillation/ACLS. -Continue full scope treatment for now - would not pursue trach/PEG -If patient does not improve or declines further, family is willing to have another discussion of comfort care and compassionate extubation  -Psychosocial and emotional support provided  Code Status/Advance Care Planning: Limited code  Palliative Prophylaxis:  Frequent Pain Assessment and Turn Reposition  Additional Recommendations (Limitations, Scope, Preferences): Full Scope Treatment  Psycho-social/Spiritual:  Desire for further Chaplaincy support:TBD Additional Recommendations: Compassionate Wean Education and Education on Hospice  Prognosis:  Poor prognosis given long-term functional decline, multiple comorbidities, severe respiratory failure and ARDS  Discharge Planning: To Be Determined      Primary Diagnoses: Present on Admission: **None**   I have reviewed the medical record, interviewed the patient and family, and examined the patient. The following aspects are pertinent.  Past Medical History:  Diagnosis Date   Anxiety    Arthritis    COPD (chronic obstructive pulmonary disease) ([place])    Coronary artery disease    x4   Depression    Diabetes mellitus without complication ([place])    Gastritis    6/23-11/15/2019 gastric outlet obstruction suggested on KUB.  EGD revealed gastritis and esophagitis   GERD (gastroesophageal reflux disease)    tums will relieve   HLD (hyperlipidemia)    HTN (hypertension)    Obstructive sleep apnea    sleep study 2006, uses CPAP, pt does not know cpap settings   On  home oxygen therapy    2L via nasal cannula prn   Shortness of breath    exertion   Social History   Socioeconomic History   Marital status: Married    Spouse name: Not on file   Number of children: Not on file   Years of education: Not on file   Highest education level: Not on file  Occupational History   Occupation: retired  Tobacco Use   Smoking status: Every Day    Packs/day: 0.25    Years: 45.00    Pack years: 11.25    Types: Cigarettes, E-cigarettes   Smokeless tobacco: Never   Tobacco comments:    stopped smoking in 2018, but still using E-cigs  Vaping Use   Vaping Use: Every day  Substance and Sexual Activity   Alcohol use: No    Alcohol/week: 0.0 standard drinks    Comment: Reports drinks a beer maybe 4 times a year.   Drug use: Not Currently    Types: Marijuana    Comment: stopped smoking marijuana june/2021   Sexual activity: Yes    Partners: Female  Birth control/protection: None  Other Topics Concern   Not on file  Social History Narrative   Not on file   Social Determinants of Health   Financial Resource Strain: Not on file  Food Insecurity: Not on file  Transportation Needs: Not on file  Physical Activity: Not on file  Stress: Not on file  Social Connections: Not on file   Family History  Problem Relation Age of Onset   Depression Mother    Heart disease Father    Emphysema Father        smoked   Scheduled Meds:  aspirin  81 mg Per Tube Daily   budesonide (PULMICORT) nebulizer solution  0.25 mg Nebulization BID   chlorhexidine gluconate (MEDLINE KIT)  15 mL Mouth Rinse BID   Chlorhexidine Gluconate Cloth  6 each Topical Daily   clopidogrel  75 mg Per Tube Daily   feeding supplement (PROSource TF)  45 mL Per Tube BID   free water  200 mL Per Tube Q4H   furosemide  80 mg Intravenous Q12H   gabapentin  100 mg Per Tube Q12H   insulin aspart  0-20 Units Subcutaneous Q4H   insulin glargine-yfgn  14 Units Subcutaneous Daily    ipratropium-albuterol  3 mL Nebulization Q4H   mouth rinse  15 mL Mouth Rinse 10 times per day   methylPREDNISolone (SOLU-MEDROL) injection  40 mg Intravenous Q12H   mirtazapine  45 mg Per Tube QHS   pantoprazole sodium  40 mg Per Tube QHS   pravastatin  40 mg Per Tube q1800   sennosides  15 mL Per Tube BID   sodium chloride flush  10-40 mL Intracatheter Q12H   Continuous Infusions:  sodium chloride Stopped (12/09/20 0247)   cefTAZidime (FORTAZ)  IV 2 g (12/14/20 0636)   feeding supplement (VITAL AF 1.2 CAL) 1,000 mL (12/13/20 1933)   fentaNYL infusion INTRAVENOUS 200 mcg/hr (12/14/20 0909)   propofol (DIPRIVAN) infusion 30 mcg/kg/min (12/14/20 0848)   PRN Meds:.acetaminophen, docusate, fentaNYL, polyethylene glycol, sodium chloride flush Medications Prior to Admission:  Prior to Admission medications   Medication Sig Start Date End Date Taking? Authorizing Provider  acetaminophen (TYLENOL) 500 MG tablet Take 500-1,000 mg by mouth every 6 (six) hours as needed for mild pain, fever or headache.   Yes [provider]  albuterol (PROAIR HFA) 108 (90 Base) MCG/ACT inhaler 2 puffs every 4 hours as needed only  if your can't catch your breath 11/29/19  Yes Medina-Vargas, Monina C, NP  amLODipine (NORVASC) 5 MG tablet Take 1 tablet (5 mg total) by mouth at bedtime. 11/29/19  Yes Medina-Vargas, Monina C, NP  aspirin EC 81 MG tablet Take 81 mg by mouth at bedtime.   Yes [provider]  atenolol (TENORMIN) 25 MG tablet Take 1 tablet (25 mg total) by mouth at bedtime. 11/29/19  Yes Medina-Vargas, Monina C, NP  bisacodyl (DULCOLAX) 5 MG EC tablet Take 5 mg by mouth daily as needed for moderate constipation.   Yes [provider]  budesonide-formoterol (SYMBICORT) 160-4.5 MCG/ACT inhaler Inhale 2 puffs into the lungs 2 (two) times daily. 11/04/20  Yes Martyn Ehrich, NP  cetirizine (ZYRTEC) 10 MG tablet Take 10 mg by mouth daily.   Yes [provider]  clopidogrel  (PLAVIX) 75 MG tablet Take 1 tablet (75 mg total) by mouth at bedtime. 11/29/19  Yes Medina-Vargas, Monina C, NP  COD LIVER OIL PO Take 1 capsule by mouth daily.   Yes [provider]  Coenzyme Q10 (COQ10) 100 MG CAPS Take 100 mg by mouth at bedtime.   Yes [provider]  feeding supplement, ENSURE ENLIVE, (ENSURE ENLIVE) LIQD Take 237 mLs by mouth daily.   Yes [provider]  gabapentin (NEURONTIN) 100 MG capsule Take 1 capsule (100 mg total) by mouth 2 (two) times daily. 10/20/20 10/20/21 Yes Arfeen, Arlyce Harman, MD  guaiFENesin (MUCINEX) 600 MG 12 hr tablet Take 600 mg by mouth 2 (two) times daily.   Yes [provider]  guaifenesin (ROBITUSSIN) 100 MG/5ML syrup Take 200 mg by mouth 3 (three) times daily as needed for cough.   Yes [provider]  KRILL OIL PO Take 2 tablets by mouth daily.   Yes [provider]  meclizine (ANTIVERT) 25 MG tablet Take 1 tablet (25 mg total) by mouth daily. 10/20/20  Yes Arfeen, Arlyce Harman, MD  melatonin 3 MG TABS tablet Take 3 mg by mouth at bedtime as needed (sleep).   Yes [provider]  mirtazapine (REMERON) 45 MG tablet Take 1 tablet (45 mg total) by mouth at bedtime. 09/18/20  Yes Arfeen, Arlyce Harman, MD  Multiple Vitamin (MULTIVITAMIN WITH MINERALS) TABS Take 1 tablet by mouth at bedtime.    Yes [provider]  nitroGLYCERIN (NITROSTAT) 0.4 MG SL tablet Place 1 tablet (0.4 mg total) under the tongue every 5 (five) minutes as needed for chest pain. 11/29/19  Yes Medina-Vargas, Monina C, NP  OXYGEN Inhale 4-5 L into the lungs continuous.   Yes [provider]  pravastatin (PRAVACHOL) 40 MG tablet Take 1 tablet (40 mg total) by mouth daily. Taking at bedtime 11/29/19  Yes Medina-Vargas, Monina C, NP  predniSONE (DELTASONE) 10 MG tablet Take 37m tab daily (may increase to 222mduring acute exacerbation) Patient taking differently: Take 10 mg by mouth See admin instructions. Take 1032mab daily (may  increase to 48m55mring acute exacerbation) 11/04/20  Yes WalsMartyn Ehrich  sodium chloride (OCEAN) 0.65 % SOLN nasal spray Place 1 spray into both nostrils as needed for congestion.   Yes [provider]  testosterone cypionate (DEPOTESTOSTERONE CYPIONATE) 200 MG/ML injection Inject 200 mg into the muscle every 14 (fourteen) days.   Yes [provider]  Tiotropium Bromide Monohydrate (SPIRIVA RESPIMAT) 2.5 MCG/ACT AERS Inhale 2 puffs into the lungs daily. 11/04/20  Yes WalsMartyn Ehrich  azithromycin ([facility] [place] MG tablet Zpack taper as directed Patient not taking: Reported on 12/09/2020 11/04/20   WalsMartyn Ehrich  busPIRone (BUSPAR) 5 MG tablet Take 1 tablet (5 mg total) by mouth 2 (two) times daily. Patient not taking: Reported on 12/09/2020 06/23/20   ArfeKathlee Nations   No Known Allergies Review of Systems  Unable to perform ROS: Intubated   Physical Exam Vitals and nursing note reviewed.  Constitutional:      Appearance: He is ill-appearing.     Interventions: He is intubated.  Cardiovascular:     Rate and Rhythm: Tachycardia present.  Pulmonary:     Effort: He is intubated.  Skin:    General: Skin is warm and dry.  Neurological:     Mental Status: He is unresponsive.    Vital Signs: BP 104/71   Pulse (!) 107   Temp 99 F (37.2 C) (Oral)   Resp 20   Ht 5' 10"  (1.778 m)   Wt 94.2 kg   SpO2 93%   BMI 29.80 kg/m  Pain Scale: CPOT   Pain Score: 0-No  pain   SpO2: SpO2: 93 % O2 Device:SpO2: 93 % O2 Flow Rate: .   IO: Intake/output summary:  Intake/Output Summary (Last 24 hours) at 12/14/2020 1223 Last data filed at 12/14/2020 0600 Gross per 24 hour  Intake 1592.05 ml  Output 2950 ml  Net -1357.95 ml    LBM: Last BM Date: 12/09/20 Baseline Weight: Weight: 74.4 kg (from 2021 records) Most recent weight: Weight: 94.2 kg     Palliative Assessment/Data: 10%     Time In: 12:15pm Time Out: 1:45pm Time Total: 90  minutes Greater than 50% of this time was spent in counseling and coordinating care related to the above assessment and plan.  Dorthy Cooler, PA-C Palliative Medicine Team Team phone # [phone]  Thank you for allowing the Palliative Medicine Team to assist in the care of this patient. Please utilize secure chat with additional questions, if there is no response within 30 minutes please call the above phone number.  Palliative Medicine Team providers are available by phone from 7am to 7pm daily and can be reached through the team cell phone.  Should this patient require assistance outside of these hours, please call the patient's attending physician.

## 2020-12-15 ENCOUNTER — Inpatient Hospital Stay (HOSPITAL_COMMUNITY): Payer: Medicare Other

## 2020-12-15 DIAGNOSIS — J441 Chronic obstructive pulmonary disease with (acute) exacerbation: Secondary | ICD-10-CM | POA: Diagnosis not present

## 2020-12-15 LAB — COMPREHENSIVE METABOLIC PANEL
ALT: 22 U/L (ref 0–44)
ALT: 31 U/L (ref 0–44)
AST: 22 U/L (ref 15–41)
AST: 35 U/L (ref 15–41)
Albumin: 2.3 g/dL — ABNORMAL LOW (ref 3.5–5.0)
Albumin: 2.3 g/dL — ABNORMAL LOW (ref 3.5–5.0)
Alkaline Phosphatase: 45 U/L (ref 38–126)
Alkaline Phosphatase: 46 U/L (ref 38–126)
Anion gap: 10 (ref 5–15)
Anion gap: 8 (ref 5–15)
BUN: 45 mg/dL — ABNORMAL HIGH (ref 8–23)
BUN: 55 mg/dL — ABNORMAL HIGH (ref 8–23)
CO2: 41 mmol/L — ABNORMAL HIGH (ref 22–32)
CO2: 45 mmol/L — ABNORMAL HIGH (ref 22–32)
Calcium: 9 mg/dL (ref 8.9–10.3)
Calcium: 9.1 mg/dL (ref 8.9–10.3)
Chloride: 98 mmol/L (ref 98–111)
Chloride: 96 mmol/L — ABNORMAL LOW (ref 98–111)
Creatinine, Ser: 1.03 mg/dL (ref 0.61–1.24)
Creatinine, Ser: 1.14 mg/dL (ref 0.61–1.24)
GFR, Estimated: >60 mL/min (ref >60)
GFR, Estimated: >60 mL/min (ref >60)
Glucose, Bld: 250 mg/dL — ABNORMAL HIGH (ref 70–99)
Glucose, Bld: 207 mg/dL — ABNORMAL HIGH (ref 70–99)
Potassium: 4 mmol/L (ref 3.5–5.1)
Potassium: 3.5 mmol/L (ref 3.5–5.1)
Sodium: 149 mmol/L — ABNORMAL HIGH (ref 135–145)
Sodium: 149 mmol/L — ABNORMAL HIGH (ref 135–145)
Total Bilirubin: 0.7 mg/dL (ref 0.3–1.2)
Total Bilirubin: 0.8 mg/dL (ref 0.3–1.2)
Total Protein: 5.9 g/dL — ABNORMAL LOW (ref 6.5–8.1)
Total Protein: 6 g/dL — ABNORMAL LOW (ref 6.5–8.1)

## 2020-12-15 LAB — CBC
HCT: 39.7 % (ref 39.0–52.0)
Hemoglobin: 12.4 g/dL — ABNORMAL LOW (ref 13.0–17.0)
MCH: 27 pg (ref 26.0–34.0)
MCHC: 31.2 g/dL (ref 30.0–36.0)
MCV: 86.5 fL (ref 80.0–100.0)
Platelets: 275 10*3/uL (ref 150–400)
RBC: 4.59 MIL/uL (ref 4.22–5.81)
RDW: 18.3 % — ABNORMAL HIGH (ref 11.5–15.5)
WBC: 12.4 10*3/uL — ABNORMAL HIGH (ref 4.0–10.5)
nRBC: 0 % (ref 0.0–0.2)

## 2020-12-15 LAB — MAGNESIUM: Magnesium: 2.8 mg/dL — ABNORMAL HIGH (ref 1.7–2.4)

## 2020-12-15 LAB — GLUCOSE, CAPILLARY
Glucose-Capillary: 161 mg/dL — ABNORMAL HIGH (ref 70–99)
Glucose-Capillary: 203 mg/dL — ABNORMAL HIGH (ref 70–99)
Glucose-Capillary: 210 mg/dL — ABNORMAL HIGH (ref 70–99)
Glucose-Capillary: 234 mg/dL — ABNORMAL HIGH (ref 70–99)
Glucose-Capillary: 246 mg/dL — ABNORMAL HIGH (ref 70–99)
Glucose-Capillary: 263 mg/dL — ABNORMAL HIGH (ref 70–99)

## 2020-12-15 LAB — TRIGLYCERIDES: Triglycerides: 109 mg/dL (ref <150)

## 2020-12-15 MED ORDER — FUROSEMIDE 10 MG/ML IJ SOLN
80.0000 mg | Freq: Once | INTRAMUSCULAR | Status: AC
  Administered 2020-12-15: 80 mg via INTRAVENOUS
  Filled 2020-12-15: qty 8

## 2020-12-15 MED ORDER — POLYETHYLENE GLYCOL 3350 17 G PO PACK
17.0000 g | PACK | Freq: Two times a day (BID) | ORAL | Status: DC
  Administered 2020-12-15 – 2020-12-16 (×4): 17 g via ORAL
  Filled 2020-12-15 (×5): qty 1

## 2020-12-15 MED ORDER — METOLAZONE 2.5 MG PO TABS
5.0000 mg | ORAL_TABLET | Freq: Once | ORAL | Status: AC
  Administered 2020-12-15: 5 mg
  Filled 2020-12-15: qty 2

## 2020-12-15 MED ORDER — METHYLPREDNISOLONE SODIUM SUCC 125 MG IJ SOLR
40.0000 mg | INTRAMUSCULAR | Status: DC
Start: 2020-12-15 — End: 2020-12-17
  Administered 2020-12-15 – 2020-12-16 (×2): 40 mg via INTRAVENOUS
  Filled 2020-12-15 (×2): qty 2

## 2020-12-15 MED ORDER — INSULIN ASPART 100 UNIT/ML IJ SOLN
4.0000 [IU] | INTRAMUSCULAR | Status: DC
  Administered 2020-12-15 – 2020-12-16 (×6): 4 [IU] via SUBCUTANEOUS

## 2020-12-15 MED ORDER — METHYLNALTREXONE BROMIDE 12 MG/0.6ML ~~LOC~~ SOLN: 12.0000 mg | Freq: Once | SUBCUTANEOUS | Status: DC

## 2020-12-15 MED ORDER — INSULIN GLARGINE-YFGN 100 UNIT/ML ~~LOC~~ SOLN
20.0000 [IU] | Freq: Every day | SUBCUTANEOUS | Status: DC
  Administered 2020-12-15: 20 [IU] via SUBCUTANEOUS
  Filled 2020-12-15 (×2): qty 0.2

## 2020-12-15 MED ORDER — BISACODYL 10 MG RE SUPP
10.0000 mg | Freq: Every day | RECTAL | Status: AC
  Administered 2020-12-15 – 2020-12-16 (×2): 10 mg via RECTAL
  Filled 2020-12-15 (×2): qty 1

## 2020-12-15 NOTE — Progress Notes (Signed)
RT attempted CPT through the bed, pt did not tolerate. Pt's SpO2 decreased to 79% with good waveform. CPT was stopped and pt's SpO2 is now 95%. RN notified as well.

## 2020-12-15 NOTE — Progress Notes (Signed)
NAME:  [name], MRN:  [id number], DOB:  1951/05/07, LOS: 7 ADMISSION DATE:  12/12/2020 CONSULTATION DATE:  11/25/2020 REFERRING MD:  Lavenia Atlas CHIEF COMPLAINT:  SOB, hypercapnic/hypoxic respiratory failure   History of Present Illness:  70 year old man with PMHx significant for HTN, HLD, CAD (4-vessel), OSA (on CPAP), COPD (GOLD stage III, on 4LNC home O2) who presented to [facility] ED 7/25 via EMS for SOB.   At home, hypoxic on [facility] with sats 70%. Transitioned to NRB then CPAP. DuoNeb and Solumedrol administered in the field. Initially GCS 14, quickly declining to GCS 3 in ED. Patient subsequently intubated in ED for hypoxia and AMS. ABG showed pH 7.294/pCO2 64.6/pO2 372/Bicarb 31.6. Patient became progressively hypotensive post-intubation requiring Levophed initiation and CVL placement by ED.  PCCM consulted for admission.  Pertinent Medical History:  Four-vessel coronary artery disease, hypertension, hyperlipidemia, OSA on CPAP, Gold stage III COPD with resultant chronic respiratory failure on 4 L baseline supplemental oxygen.  Anxiety, depression, diabetes, gastritis.  GERD, hyperlipidemia, hypertension.  Significant Hospital Events: Including procedures, antibiotic start and stop dates in addition to other pertinent events   7/25 - BIB EMS for SOB, hypoxic/hypercapnic respiratory failure. Initially maintained on CPAP, became progressively altered (GCS 3) requiring intubation. PCCM consulted for admission.  Hypotensive following intubation, right IJ triple-lumen catheter placed by ER.  Started on norepinephrine.  IV fluids administered.  From pulmonary standpoint in addition to mechanical ventilation therapeutic interventions included: Scheduled bronchodilators, IV systemic steroids, and ceftriaxone with azithromycin.  Respiratory culture sent. 7/26: Heavily sedated, apparently was agitated overnight.  Still requiring norepinephrine infusion.,  Lactate actually trending up instead of  down.  Bleeding from right IJ triple-lumen catheter. Suture placed. 7/27 weaned OFF norepi  7/28 CXR worse. Got lasix abx changed to Ceftazidime as cultures back and growing Pseudomonas (note UC also + w/ pseudomonas: but only 4K) 7/29 echo ordered. EF 60-65%, no WMA, mild concentric hypertrophy, RV fxn nml.  7/30: Increasing diuretics.  Hemodynamically stable.  Removing Foley catheter after diuresis.  Still on high PEEP/FiO2.  Increasing frequency of bronchodilators. 7/31: Changed prednisone back to IV steroids.  Repeated sputum culture.  Spoke to wife, made aware of poor prognosis.  Palliative care planning on meeting later today.  Sodium climbing.  Free water replacement added. Interim History / Subjective:   No events, remains on high vent settings. Reducitions in sedation result in ventilator desynchrony  Objective:  Blood pressure (!) 136/92, pulse (!) 111, temperature 98.6 F (37 C), resp. rate 19, height '5\' 10"'$  (1.778 m), weight 93.4 kg, SpO2 94 %.    Vent Mode: PRVC FiO2 (%):  [80 %-90 %] 80 % Set Rate:  [20 bmp] 20 bmp Vt Set:  [510 mL] 510 mL PEEP:  [12 cmH20] 12 cmH20 Plateau Pressure:  [20 cmH20-25 cmH20] 22 cmH20   Intake/Output Summary (Last 24 hours) at 12/15/2020 0748 Last data filed at 12/15/2020 0700 Gross per 24 hour  Intake 3728.9 ml  Output 4515 ml  Net -786.1 ml    Filed Weights   12/13/20 0341 12/14/20 0325 12/15/20 0100  Weight: 94.5 kg 94.2 kg 93.4 kg    Physical Examination: Ill appearing man on vent Abdomen soft, hypoactive BS Thick bloody puruent secretions from ETT Ext without much edema Not following commands on current sedation level Fighting and double triggering vent  Diuresed 700 yesterday Sodium remains up CBG up: adjusted regimen  Resolved Hospital Problem List   Lactic acidosis  Septic shock resolved Assessment &  Plan:  Acute-on-chronic hypoxemic/hypercapnic respiratory failure in the setting of acute exacerbation chronic  obstructive pulmonary disease and pseudomonas PNA  History of COPD, GOLD stage III on 4L [place] baseline -Bronchodilators, dc ICS -10 days ceftaz -Watch fever/WBC curve -F/u new tracheal aspirated from 7/31  Fluid and electrolyte imbalance: Hypernatremia--> - Metolazone x 1, continue free water, continue lasix challenege  History of HTN, four-vessel coronary artery disease, and hyperlipidemia. Had trop I bump but EF good and no WM abnormality on echo Plan Cont plavix and pravastatin  Cont tele   Type 2 diabetes mellitus, Drop steroids to daily Increase lantus, increase TF coverage  Acute metabolic encephalopathy History of anxiety and depression PAD as above Cont Neurontin   GOC- end stage COPD with severe pseudomonas bilateral pneumonia, family understands this may be terminal; patient does not want a trach, continue supportive care including vent with potential eventual transition to comfort if no changes over next couple days, palliative following  Best Practice (right click and "Reselect all SmartList Selections" daily)   Diet/type: tubefeeds DVT prophylaxis: LMWH GI prophylaxis: PPI Lines: Central line Foley:  Yes, and it is still needed Code Status:  full code Last date of multidisciplinary goals of care discussion [7/31]   Patient critically ill due to respiratory failure Interventions to address this today vent titration, sedation titration Risk of deterioration without these interventions is high  I personally spent 38 minutes providing critical care not including any separately billable procedures  Erskine Emery MD [facility]  Prefer epic messenger for cross cover needs If after hours, please call E-link

## 2020-12-15 DEATH — deceased

## 2020-12-16 ENCOUNTER — Inpatient Hospital Stay (HOSPITAL_COMMUNITY): Payer: Medicare Other

## 2020-12-16 DIAGNOSIS — J9601 Acute respiratory failure with hypoxia: Secondary | ICD-10-CM | POA: Diagnosis not present

## 2020-12-16 DIAGNOSIS — J441 Chronic obstructive pulmonary disease with (acute) exacerbation: Secondary | ICD-10-CM | POA: Diagnosis not present

## 2020-12-16 DIAGNOSIS — J9602 Acute respiratory failure with hypercapnia: Secondary | ICD-10-CM | POA: Diagnosis not present

## 2020-12-16 DIAGNOSIS — J151 Pneumonia due to Pseudomonas: Secondary | ICD-10-CM

## 2020-12-16 LAB — CBC
HCT: 36.6 % — ABNORMAL LOW (ref 39.0–52.0)
Hemoglobin: 11.2 g/dL — ABNORMAL LOW (ref 13.0–17.0)
MCH: 26.9 pg (ref 26.0–34.0)
MCHC: 30.6 g/dL (ref 30.0–36.0)
MCV: 87.8 fL (ref 80.0–100.0)
Platelets: 259 10*3/uL (ref 150–400)
RBC: 4.17 MIL/uL — ABNORMAL LOW (ref 4.22–5.81)
RDW: 18.2 % — ABNORMAL HIGH (ref 11.5–15.5)
WBC: 12.3 10*3/uL — ABNORMAL HIGH (ref 4.0–10.5)
nRBC: 0.5 % — ABNORMAL HIGH (ref 0.0–0.2)

## 2020-12-16 LAB — POCT I-STAT 7, (LYTES, BLD GAS, ICA,H+H)
Acid-Base Excess: 18 mmol/L — ABNORMAL HIGH (ref 0.0–2.0)
Bicarbonate: 50.3 mmol/L — ABNORMAL HIGH (ref 20.0–28.0)
Calcium, Ion: 1.28 mmol/L (ref 1.15–1.40)
HCT: 38 % — ABNORMAL LOW (ref 39.0–52.0)
Hemoglobin: 12.9 g/dL — ABNORMAL LOW (ref 13.0–17.0)
O2 Saturation: 70 %
Patient temperature: 98.9
Potassium: 3.5 mmol/L (ref 3.5–5.1)
Sodium: 145 mmol/L (ref 135–145)
TCO2: >50 mmol/L — ABNORMAL HIGH (ref 22–32)
pCO2 arterial: 106.4 mmHg (ref 32.0–48.0)
pH, Arterial: 7.283 — ABNORMAL LOW (ref 7.350–7.450)
pO2, Arterial: 45 mmHg — ABNORMAL LOW (ref 83.0–108.0)

## 2020-12-16 LAB — BASIC METABOLIC PANEL
Anion gap: 9 (ref 5–15)
BUN: 67 mg/dL — ABNORMAL HIGH (ref 8–23)
CO2: 43 mmol/L — ABNORMAL HIGH (ref 22–32)
Calcium: 9.1 mg/dL (ref 8.9–10.3)
Chloride: 95 mmol/L — ABNORMAL LOW (ref 98–111)
Creatinine, Ser: 1.43 mg/dL — ABNORMAL HIGH (ref 0.61–1.24)
GFR, Estimated: 52 mL/min — ABNORMAL LOW (ref >60)
Glucose, Bld: 256 mg/dL — ABNORMAL HIGH (ref 70–99)
Potassium: 4.1 mmol/L (ref 3.5–5.1)
Sodium: 147 mmol/L — ABNORMAL HIGH (ref 135–145)

## 2020-12-16 LAB — CULTURE, RESPIRATORY W GRAM STAIN

## 2020-12-16 LAB — MAGNESIUM: Magnesium: 2.9 mg/dL — ABNORMAL HIGH (ref 1.7–2.4)

## 2020-12-16 LAB — GLUCOSE, CAPILLARY
Glucose-Capillary: 231 mg/dL — ABNORMAL HIGH (ref 70–99)
Glucose-Capillary: 251 mg/dL — ABNORMAL HIGH (ref 70–99)
Glucose-Capillary: 288 mg/dL — ABNORMAL HIGH (ref 70–99)
Glucose-Capillary: 222 mg/dL — ABNORMAL HIGH (ref 70–99)
Glucose-Capillary: 183 mg/dL — ABNORMAL HIGH (ref 70–99)
Glucose-Capillary: 137 mg/dL — ABNORMAL HIGH (ref 70–99)

## 2020-12-16 MED ORDER — WHITE PETROLATUM EX OINT
TOPICAL_OINTMENT | CUTANEOUS | Status: AC
  Filled 2020-12-16: qty 28.35

## 2020-12-16 MED ORDER — FREE WATER
400.0000 mL | Status: DC
  Administered 2020-12-16 – 2020-12-17 (×7): 400 mL

## 2020-12-16 MED ORDER — INSULIN GLARGINE-YFGN 100 UNIT/ML ~~LOC~~ SOLN
30.0000 [IU] | Freq: Every day | SUBCUTANEOUS | Status: DC
  Administered 2020-12-16: 30 [IU] via SUBCUTANEOUS
  Filled 2020-12-16 (×2): qty 0.3

## 2020-12-16 MED ORDER — VECURONIUM BROMIDE 10 MG IV SOLR: 10.0000 mg | Freq: Once | INTRAVENOUS | Status: AC

## 2020-12-16 MED ORDER — INSULIN ASPART 100 UNIT/ML IJ SOLN
8.0000 [IU] | INTRAMUSCULAR | Status: DC
  Administered 2020-12-16 – 2020-12-17 (×7): 8 [IU] via SUBCUTANEOUS

## 2020-12-16 MED ORDER — VECURONIUM BROMIDE 10 MG IV SOLR
INTRAVENOUS | Status: AC
  Administered 2020-12-16: 10 mg via INTRAVENOUS
  Filled 2020-12-16: qty 10

## 2020-12-16 MED ORDER — LINEZOLID 600 MG/300ML IV SOLN
600.0000 mg | Freq: Two times a day (BID) | INTRAVENOUS | Status: DC
  Administered 2020-12-16 (×2): 600 mg via INTRAVENOUS
  Filled 2020-12-16 (×4): qty 300

## 2020-12-16 MED ORDER — FUROSEMIDE 10 MG/ML IJ SOLN
80.0000 mg | Freq: Once | INTRAMUSCULAR | Status: AC
  Administered 2020-12-16: 80 mg via INTRAVENOUS
  Filled 2020-12-16: qty 8

## 2020-12-16 MED ORDER — METHYLNALTREXONE BROMIDE 12 MG/0.6ML ~~LOC~~ SOLN
12.0000 mg | Freq: Once | SUBCUTANEOUS | Status: AC
  Administered 2020-12-16: 12 mg via SUBCUTANEOUS
  Filled 2020-12-16: qty 0.6

## 2020-12-16 NOTE — Progress Notes (Signed)
Called to assess patient at bedside given worsening hypoxemia.  Patient satting in mid 64s at time of arrival.  FiO2 100%, PEEP 12, PRVC.  Patient does not dyssynchronous but does breathe over set rate.  Await chest x-ray, performed while present.  On my review no pneumothorax.  Appears bilateral lower to mid lung field airspace disease is worsening compared to prior.  Reviewed CT chest 2021 revealed significant severe emphysema with craniocaudal gradient in terms of most severe to less severe.  Note he has AKI but has a positive fluid balance over the last 2 days.  Lasix 10 mg IV given.  PO2 45 on ABG with acceptable pH, PCO2 very high, 106.  Respiratory rate increased to 24.  Dose of vecuronium given to see if helps PO2.  Thick discussed care with patient's wife via phone on 2 separate occasions.  12 minutes spent in conversation with wife.  Discussed this clinical worsening.  She seemed incredulous as to how this is happening since he was on less FiO2 and O2 sats were better during day when she was visiting.  I explained that I suspect he has worsening pneumonia.  Explained that MRSA was discovered and while on the appropriate antibiotics, it usually takes days for antibiotics to effectively treat infection.  Explained that he has had a setback and while I wish this was different, we will try there with him again to make things better.  I just want to update her on what had changed over the last few hours with her husband.  She expressed understanding.  Interventions: Lasix 80 mg IV, vecuronium push x1 Increased respiratory rate to account for temporary paralysis Reviewed and interpreted chest x-ray and compared to several serial chest x-rays preceding, reviewed CT chest in 2021  CRITICAL CARE Performed by: Bonna Gains Maricsa Sammons   Total critical care time: 32 minutes  Critical care time was exclusive of separately billable procedures and treating other patients.  Critical care was necessary to treat  or prevent imminent or life-threatening deterioration.  Critical care was time spent personally by me on the following activities: development of treatment plan with patient and/or surrogate as well as nursing, discussions with consultants, evaluation of patient's response to treatment, examination of patient, obtaining history from patient or surrogate, ordering and performing treatments and interventions, ordering and review of laboratory studies, ordering and review of radiographic studies, pulse oximetry and re-evaluation of patient's condition.   Lanier Clam, MD

## 2020-12-16 NOTE — Progress Notes (Signed)
NAME:  [name], MRN:  [id number], DOB:  1951/03/24, LOS: 42 ADMISSION DATE:  12/04/2020 CONSULTATION DATE:  11/19/2020 REFERRING MD:  Lavenia Atlas CHIEF COMPLAINT:  SOB, hypercapnic/hypoxic respiratory failure   History of Present Illness:  70 year old man with PMHx significant for HTN, HLD, CAD (4-vessel), OSA (on CPAP), COPD (GOLD stage III, on 4LNC home O2) who presented to [facility] ED 7/25 via EMS for SOB.   At home, hypoxic on [facility] with sats 70%. Transitioned to NRB then CPAP. DuoNeb and Solumedrol administered in the field. Initially GCS 14, quickly declining to GCS 3 in ED. Patient subsequently intubated in ED for hypoxia and AMS. ABG showed pH 7.294/pCO2 64.6/pO2 372/Bicarb 31.6. Patient became progressively hypotensive post-intubation requiring Levophed initiation and CVL placement by ED.  PCCM consulted for admission.  Pertinent Medical History:  Four-vessel coronary artery disease, hypertension, hyperlipidemia, OSA on CPAP, Gold stage III COPD with resultant chronic respiratory failure on 4 L baseline supplemental oxygen.  Anxiety, depression, diabetes, gastritis.  GERD, hyperlipidemia, hypertension.  Significant Hospital Events: Including procedures, antibiotic start and stop dates in addition to other pertinent events   7/25 - BIB EMS for SOB, hypoxic/hypercapnic respiratory failure. Initially maintained on CPAP, became progressively altered (GCS 3) requiring intubation. PCCM consulted for admission.  Hypotensive following intubation, right IJ triple-lumen catheter placed by ER.  Started on norepinephrine.  IV fluids administered.  From pulmonary standpoint in addition to mechanical ventilation therapeutic interventions included: Scheduled bronchodilators, IV systemic steroids, and ceftriaxone with azithromycin.  Respiratory culture sent. 7/26: Heavily sedated, apparently was agitated overnight.  Still requiring norepinephrine infusion.,  Lactate actually trending up instead of  down.  Bleeding from right IJ triple-lumen catheter. Suture placed. 7/27 weaned OFF norepi  7/28 CXR worse. Got lasix abx changed to Ceftazidime as cultures back and growing Pseudomonas (note UC also + w/ pseudomonas: but only 4K) 7/29 echo ordered. EF 60-65%, no WMA, mild concentric hypertrophy, RV fxn nml.  7/30: Increasing diuretics.  Hemodynamically stable.  Removing Foley catheter after diuresis.  Still on high PEEP/FiO2.  Increasing frequency of bronchodilators. 7/31: Changed prednisone back to IV steroids.  Repeated sputum culture.  Spoke to wife, made aware of poor prognosis.  Palliative care planning on meeting later today.  Sodium climbing.  Free water replacement added. Interim History / Subjective:  No events. Renal function worse. Remains on high vent settings.  Objective:  Blood pressure 106/73, pulse (!) 107, temperature (!) 100.4 F (38 C), resp. rate (!) 21, height '5\' 10"'$  (1.778 m), weight 92.7 kg, SpO2 91 %.    Vent Mode: PRVC FiO2 (%):  [60 %-100 %] 100 % Set Rate:  [20 bmp] 20 bmp Vt Set:  [510 mL] 510 mL PEEP:  [12 cmH20] 12 cmH20 Plateau Pressure:  [21 cmH20-28 cmH20] 25 cmH20   Intake/Output Summary (Last 24 hours) at 12/16/2020 0743 Last data filed at 12/16/2020 0700 Gross per 24 hour  Intake 3973.07 ml  Output 3600 ml  Net 373.07 ml    Filed Weights   12/14/20 0325 12/15/20 0100 12/16/20 0500  Weight: 94.2 kg 93.4 kg 92.7 kg    Physical Examination: Ill appearing man on vent Lungs heavily diminished thick secretions More synchronous with vent today Mild global anasarca RASS -4 Pupils equal  WBC stable Spiking fevers on ceftaz CXR yesterday continued bilateral airspace disease Sugars up  Resolved Hospital Problem List   Lactic acidosis  Septic shock resolved  Assessment & Plan:  Acute-on-chronic hypoxemic/hypercapnic respiratory failure in the  setting of acute exacerbation chronic obstructive pulmonary disease and pseudomonas/staph PNA   History of COPD, GOLD stage III on 4L [place] baseline -Bronchodilators, dc ICS -10 days ceftaz; add linezolid -Watch fever/WBC curve  Fluid and electrolyte imbalance: Hypernatremia--> - Increase free water, hold lasix with Cr bump  History of HTN, four-vessel coronary artery disease, and hyperlipidemia.  Had trop I bump but EF good and no WM abnormality on echo -Cont plavix and pravastatin  -Cont tele   Type 2 diabetes mellitus, -Continue methylpred '40mg'$  daily x 5 more days then stop -Increase lantus, increase TF coverage  Acute metabolic encephalopathy History of anxiety and depression -PAD as above -Cont Neurontin   GOC- end stage COPD with severe bacterial bilateral pneumonia, family understands this may be terminal; patient does not want a trach, continue supportive care including vent with potential eventual transition to comfort if no changes over next couple days, palliative following  Best Practice (right click and "Reselect all SmartList Selections" daily)   Diet/type: tubefeeds DVT prophylaxis: LMWH GI prophylaxis: PPI Lines: Central line Foley:  Yes, and it is still needed Code Status:  full code Last date of multidisciplinary goals of care discussion [7/31]   Patient critically ill due to respiratory failure Interventions to address this today vent titration, sedation titration Risk of deterioration without these interventions is high  I personally spent 34 minutes providing critical care not including any separately billable procedures  Erskine Emery MD [place] [facility]  Prefer epic messenger for cross cover needs If after hours, please call E-link

## 2020-12-16 NOTE — Progress Notes (Signed)
Daily Progress Note   Patient Name: [name]       Date: 12/16/2020 DOB: 29-Apr-1951  Age: 70 y.o. MRN#: [id number] Attending Physician: Candee Furbish, MD Primary Care Physician: Forrest Moron, MD Admit Date: 12/12/2020  Reason for Consultation/Follow-up: Establishing goals of care  Subjective: Medical records reviewed. Discussed with RN [name] and assessed patient at the bedside. He is intubated and saturating 91% despite full vent support (FiO2 100%). I met with [name] at the bedside for a follow up family meeting, with patient's daughter [name] and son [name] on speaker phone again as well.  Reviewed patient's interval history since our initial goals of care conversation two days ago. Updated on patient's worsening respiratory status, kidney function, and poor response to attempts to wean sedation. [name] shares that she had a spiritual experience yesterday, with a strong feeling that new therapies should be tried for the patient's best chance of improving over the next week. Discussed the importance of continued prayer while time trial of treatments are ongoing. Counseled on risk of prolonged suffering and the importance of setting a window for this time limited trial. [name] was then able to join the meeting and review patient's current illness, including the plan to begin a new antibiotic today based on sputum culture results.  Patient's son [name] will be coming to visit the patient for the first time in several years. He will arrive tonight and visit at the bedside tomorrow. Patient's family all agree to set a tentative date of Saturday 8/6 for compassionate extubation if he does not improve. They hope this will not be necessary, but they also express their priority of his  dignity and allowing a peaceful death rather than prolonging the dying process.  Questions and concerns addressed. PMT will continue to support holistically.  Length of Stay: 8  Current Medications: Scheduled Meds:   aspirin  81 mg Per Tube Daily   chlorhexidine gluconate (MEDLINE KIT)  15 mL Mouth Rinse BID   Chlorhexidine Gluconate Cloth  6 each Topical Daily   clopidogrel  75 mg Per Tube Daily   enoxaparin (LOVENOX) injection  40 mg Subcutaneous Q24H   feeding supplement (PROSource TF)  45 mL Per Tube BID   free water  400 mL Per Tube Q4H   gabapentin  100  mg Per Tube Q12H   insulin aspart  0-20 Units Subcutaneous Q4H   insulin aspart  8 Units Subcutaneous Q4H   insulin glargine-yfgn  30 Units Subcutaneous Daily   ipratropium-albuterol  3 mL Nebulization Q4H   mouth rinse  15 mL Mouth Rinse 10 times per day   methylPREDNISolone (SOLU-MEDROL) injection  40 mg Intravenous Q24H   mirtazapine  45 mg Per Tube QHS   pantoprazole sodium  40 mg Per Tube QHS   polyethylene glycol  17 g Oral BID   pravastatin  40 mg Per Tube q1800   sennosides  15 mL Per Tube BID   sodium chloride flush  10-40 mL Intracatheter Q12H    Continuous Infusions:  sodium chloride 10 mL/hr at 12/16/20 0700   cefTAZidime (FORTAZ)  IV Stopped (12/16/20 0551)   feeding supplement (VITAL AF 1.2 CAL) 1,000 mL (12/16/20 0205)   fentaNYL infusion INTRAVENOUS 175 mcg/hr (12/16/20 0700)   linezolid (ZYVOX) IV 600 mg (12/16/20 0958)   propofol (DIPRIVAN) infusion 30 mcg/kg/min (12/16/20 0700)    PRN Meds: acetaminophen, docusate, fentaNYL, polyethylene glycol, sodium chloride flush  Physical Exam Vitals and nursing note reviewed.  Constitutional:      Appearance: He is ill-appearing.     Interventions: He is intubated.  Cardiovascular:     Rate and Rhythm: Tachycardia present.  Pulmonary:     Effort: Tachypnea present. He is intubated.  Neurological:     Mental Status: He is unresponsive.     Comments:  Sedated            Vital Signs: BP 111/73   Pulse (!) 107   Temp (!) 100.4 F (38 C)   Resp (!) 24   Ht 5' 10" (1.778 m)   Wt 92.7 kg   SpO2 91%   BMI 29.32 kg/m  SpO2: SpO2: 91 % O2 Device: O2 Device: Ventilator O2 Flow Rate:    Intake/output summary:  Intake/Output Summary (Last 24 hours) at 12/16/2020 1022 Last data filed at 12/16/2020 1001 Gross per 24 hour  Intake 3677.8 ml  Output 2700 ml  Net 977.8 ml   LBM: Last BM Date: 12/09/20 Baseline Weight: Weight: 74.4 kg (from 2021 records) Most recent weight: Weight: 92.7 kg       Palliative Assessment/Data: 10%      Patient Active Problem List   Diagnosis Date Noted   Acute respiratory failure with hypoxia and hypercapnia (HCC)    Wheezing    Acute respiratory failure with hypoxemia ([place]) 11/20/2020   Sepsis due to pneumonia ([place])    Goals of care, counseling/discussion 01/11/2020   Gastritis 11/20/2019   Neurocognitive deficits 11/20/2019   Warts 11/09/2019   Acute on chronic respiratory failure with hypoxia ([place]) 11/07/2019   DM (diabetes mellitus), type 2 with peripheral vascular complications (HCC)    Coronary artery disease    HLD (hyperlipidemia)    HTN (hypertension)    Obstructive sleep apnea    Elevated prostate specific antigen (PSA) 02/01/2018   COPD exacerbation ([place]) 11/11/2015   Smoker 05/02/2013   COPD GOLD III with reversibility  05/01/2013   Chronic respiratory failure with hypoxia ([place]) 05/01/2013   Depression 06/30/2011    Palliative Care Assessment & Plan   Patient Profile: 70 y.o. male  with past medical history of HTN, HLD, CAD (4-vessel), OSA (on CPAP), COPD (GOLD stage III, on 4LNC home O2), DM2, GERD admitted on 11/23/2020 with shortness of breath, hypoxic/hypercapnic respiratory failure, severe pseudomonas bilateral pneumonia.   In  the ED, patient was intubated for hypoxia and AMS. Patient has required full vent support and heavy sedation for vent dyssynchrony. Palliative medicine  has been consulted assist with goals of care conversation.  Assessment: -Acute-on-chronic hypoxemic/hypercapnic respiratory failure in the setting of acute exacerbation chronic obstructive pulmonary disease and pseudomonas/staph PNA, worsening -Goals of care conversation  Recommendations/Plan: Continue current interventions Patient's family agree to a time limited trial of treatments until Saturday 8/6 with discussion of compassionate extubation and transition to comfort at that time if he does not improve Consult spiritual care for prayer/support Psychosocial and emotional support provided   Goals of Care and Additional Recommendations: Limitations on Scope of Treatment: Full Scope Treatment  Code Status: Partial   Code Status Orders  (From admission, onward)           Start     Ordered   12/14/20 1329  Limited resuscitation (code)  Continuous       Question Answer Comment  In the event of cardiac or respiratory ARREST: Initiate Code Blue, Call Rapid Response No   In the event of cardiac or respiratory ARREST: Perform CPR No   In the event of cardiac or respiratory ARREST: Perform Intubation/Mechanical Ventilation Yes   In the event of cardiac or respiratory ARREST: Use NIPPV/BiPAp only if indicated Yes   In the event of cardiac or respiratory ARREST: Administer ACLS medications if indicated No   In the event of cardiac or respiratory ARREST: Perform Defibrillation or Cardioversion if indicated No   Comments Continue current mechanical ventilation, DNR otherwise.      12/14/20 1329           Code Status History     Date Active Date Inactive Code Status Order ID Comments User Context   12/14/2020 1328 12/14/2020 1329 Partial Code [id number]  Oralia Rud Inpatient   12/02/2020 2256 12/14/2020 1327 Full Code [id number]  Jacky Kindle, MD ED   11/07/2019 0919 11/15/2019 2058 Full Code [id number]  Karmen Bongo, MD ED   04/03/2013 1327 04/06/2013 1504 Full Code  [id number]  Marily Memos, MD Inpatient       Prognosis:  Poor prognosis given long-term functional decline, multiple comorbidities, severe respiratory failure and ARDS  Discharge Planning: To Be Determined  Care plan was discussed with patient, wife [name], daughter [name], son Idris Edmundson, RN Felicia, [name]  Total time: 35 minutes Greater than 50% of this time was spent in counseling and coordinating care related to the above assessment and plan.  Dorthy Cooler, PA-C Palliative Medicine Team Team phone # [phone]  Thank you for allowing the Palliative Medicine Team to assist in the care of this patient. Please utilize secure chat with additional questions, if there is no response within 30 minutes please call the above phone number.  Palliative Medicine Team providers are available by phone from 7am to 7pm daily and can be reached through the team cell phone.  Should this patient require assistance outside of these hours, please call the patient's attending physician.

## 2020-12-16 NOTE — Progress Notes (Signed)
[place] Progress Note Patient Name: [name] DOB: [birth year] MRN: [id number]   Date of Service  12/16/2020  HPI/Events of Note  Urinary retention  - Bladder scan with 600 mL.   eICU Interventions  Plan: I/O cath PRN.     Intervention Category Major Interventions: Other:  Lysle Dingwall 12/16/2020, 10:45 PM

## 2020-12-16 NOTE — Progress Notes (Signed)
Nutrition Follow-up  DOCUMENTATION CODES:   Not applicable  INTERVENTION:   Continue tube feeding via OG tube: Vital AF 1.2 at 60 ml/h (1440 ml per day) Prosource TF 45 ml BID  Provides 1808 kcal (2162 kcal total with propofol), 130 gm protein, 1168 ml free water daily  NUTRITION DIAGNOSIS:   Inadequate oral intake related to inability to eat as evidenced by NPO status.  GOAL:   Patient will meet greater than or equal to 90% of their needs  MONITOR:   Vent status, TF tolerance, Labs  REASON FOR ASSESSMENT:   Ventilator, Consult Enteral/tube feeding initiation and management  ASSESSMENT:   70 yo male admitted with hypoxic respiratory failure, CAP. PMH includes HTN, HLD, CAD, OSA, COPD, DM, anxiety, depression, GERD, gastritis.  Discussed patient in ICU rounds and with RN today. Being treated for end stage COPD with severe bacterial bilateral pneumonia. S/P family meeting today. Plans to continue care through Saturday and readdress goals of care if no improvement.  Renal function is declining. Continues to be febrile.   OG tube in place, receiving Vital AF 1.2 at 60 ml/h with Prosource TF 45 ml BID. Free water flushes increased today to 400 ml every 4 hours.   Patient remains intubated on ventilator support MV: 11.2 L/min Temp (24hrs), Avg:100.5 F (38.1 C), Min:99.5 F (37.5 C), Max:101.12 F (38.4 C)  Propofol: 13.4 ml/hr providing 354 kcal from lipid  Labs reviewed. Sodium 147, mag 2.9, BUN 67, creat 1.43 CBG: [phone]  Medications reviewed and include Novolog, Semglee, Solu-medrol, Remeron, Miralax, Senokot, propofol.  Admission weight 92.1 kg Current weight 92.7 kg  I/O +2 L since admission UOP 3600 ml x 24 hours   Diet Order:   Diet Order             Diet NPO time specified  Diet effective now                   EDUCATION NEEDS:   Not appropriate for education at this time  Skin:  Skin Assessment: Skin Integrity Issues: Skin  Integrity Issues:: DTI, Other (Comment) DTI: Upper lip from ETT holder Other: Blister to L elbow  Last BM:  8/1 type 7  Height:   Ht Readings from Last 1 Encounters:  11/25/2020 '5\' 10"'$  (1.778 m)    Weight:   Wt Readings from Last 1 Encounters:  12/16/20 92.7 kg    Ideal Body Weight:  75.5 kg  BMI:  Body mass index is 29.32 kg/m.  Estimated Nutritional Needs:   Kcal:  2175  Protein:  120-135 gm  Fluid:  >/= 2 L    Lucas Mallow, RD, LDN, CNSC Please refer to Amion for contact information.

## 2020-12-16 NOTE — Progress Notes (Signed)
Upon assessment it was noted pt was tachycardiac into 130's, and has been for several hours. Heavy sedation of RASS -5. Pulse ox reading 87-89% but probe on ear and waveform not reading accurately/very dampened. Pulse ox probe changed to extremities and pulse reading consistently in the low 70's. Bloody secretions suctioned from ETT. Pt was already having low O2 sats before cleaning patient up. RT called to bedside.  [facility] MD contacted and stat orders completed. Ground team to bedside and new orders given. Verbal order to call MD back if O2 sats maintain at or below 77%.

## 2020-12-16 NOTE — Progress Notes (Addendum)
This chaplain responded to PMT consult for spiritual care.  The chaplain checked in with the Pt. RN-Felecia. The chaplain understands the Pt. Wife-[name] has stepped away and will return this afternoon.   This chaplain will attempt a revisit.   **1553  The chaplain is present with the Pt. wife-[name].  The chaplain introduces herself and listens as [name] introduces me to the Pt.  The chaplain understands the Pt. is a mix of many gifts and talents. The Pt. is part of a [place] faith community and enjoys reading the Bible.  [name] states, "we are looking for God's touch" in this hospitalization.  The chaplain understands [name] has the support of her father as she may need someone to lean on. The Pt. son is traveling as we speak and will visit on Thursday.  The chaplain offers F/U spiriutal care as needed and educates [name] how to reach a chaplain. The chaplain's invitation for prayer is accepted.

## 2020-12-16 NOTE — Progress Notes (Addendum)
[place] Progress Note Patient Name: [name] DOB: Jun 28, 1950 MRN: [id number]   Date of Service  12/16/2020  HPI/Events of Note  Hypoxia - Sat decreased to 75% by bedside oximetry. Patient on 100% and P 12. No response to recruitment maneuver. Patient appears to be well sedated.   eICU Interventions  Plan: Portable CXR STAT. ABG STAT. Will request that PCCM ground team evaluate the patient at bedside.     Intervention Category Major Interventions: Hypoxemia - evaluation and management  Stephanny Tsutsui [name] 12/16/2020, 8:57 PM

## 2020-12-17 ENCOUNTER — Inpatient Hospital Stay (HOSPITAL_COMMUNITY): Payer: Medicare Other

## 2020-12-17 DIAGNOSIS — J9601 Acute respiratory failure with hypoxia: Secondary | ICD-10-CM | POA: Diagnosis not present

## 2020-12-17 DIAGNOSIS — Z515 Encounter for palliative care: Secondary | ICD-10-CM

## 2020-12-17 DIAGNOSIS — Z7189 Other specified counseling: Secondary | ICD-10-CM

## 2020-12-17 LAB — GLUCOSE, CAPILLARY
Glucose-Capillary: 195 mg/dL — ABNORMAL HIGH (ref 70–99)
Glucose-Capillary: 213 mg/dL — ABNORMAL HIGH (ref 70–99)

## 2020-12-17 LAB — BASIC METABOLIC PANEL
Anion gap: 11 (ref 5–15)
BUN: 82 mg/dL — ABNORMAL HIGH (ref 8–23)
CO2: 42 mmol/L — ABNORMAL HIGH (ref 22–32)
Calcium: 9.2 mg/dL (ref 8.9–10.3)
Chloride: 92 mmol/L — ABNORMAL LOW (ref 98–111)
Creatinine, Ser: 1.81 mg/dL — ABNORMAL HIGH (ref 0.61–1.24)
GFR, Estimated: 39 mL/min — ABNORMAL LOW (ref >60)
Glucose, Bld: 205 mg/dL — ABNORMAL HIGH (ref 70–99)
Potassium: 4.5 mmol/L (ref 3.5–5.1)
Sodium: 145 mmol/L (ref 135–145)

## 2020-12-17 LAB — CBC
HCT: 37.7 % — ABNORMAL LOW (ref 39.0–52.0)
Hemoglobin: 11.1 g/dL — ABNORMAL LOW (ref 13.0–17.0)
MCH: 26.7 pg (ref 26.0–34.0)
MCHC: 29.4 g/dL — ABNORMAL LOW (ref 30.0–36.0)
MCV: 90.8 fL (ref 80.0–100.0)
Platelets: 296 10*3/uL (ref 150–400)
RBC: 4.15 MIL/uL — ABNORMAL LOW (ref 4.22–5.81)
RDW: 18.2 % — ABNORMAL HIGH (ref 11.5–15.5)
WBC: 17.2 10*3/uL — ABNORMAL HIGH (ref 4.0–10.5)
nRBC: 0.6 % — ABNORMAL HIGH (ref 0.0–0.2)

## 2020-12-17 MED ORDER — HEPARIN SODIUM (PORCINE) 5000 UNIT/ML IJ SOLN: 5000.0000 [IU] | Freq: Three times a day (TID) | INTRAMUSCULAR | Status: DC

## 2020-12-17 MED ORDER — SODIUM CHLORIDE 0.9 % IV SOLN
2.0000 g | Freq: Two times a day (BID) | INTRAVENOUS | Status: DC
  Filled 2020-12-17 (×2): qty 2

## 2020-12-21 DIAGNOSIS — G4733 Obstructive sleep apnea (adult) (pediatric): Secondary | ICD-10-CM | POA: Diagnosis not present

## 2020-12-23 ENCOUNTER — Other Ambulatory Visit (HOSPITAL_COMMUNITY): Payer: Self-pay | Admitting: Psychiatry

## 2020-12-23 DIAGNOSIS — F419 Anxiety disorder, unspecified: Secondary | ICD-10-CM

## 2020-12-23 DIAGNOSIS — F330 Major depressive disorder, recurrent, mild: Secondary | ICD-10-CM

## 2020-12-23 DIAGNOSIS — J449 Chronic obstructive pulmonary disease, unspecified: Secondary | ICD-10-CM | POA: Diagnosis not present

## 2020-12-30 ENCOUNTER — Telehealth (HOSPITAL_COMMUNITY): Payer: Medicare Other | Admitting: Psychiatry

## 2021-01-15 NOTE — Progress Notes (Signed)
0715 - Pt. O2 sats in the mid to upper 70s at the beginning of the shift. MD and care team aware.   [id number] - Pt O2 sats started slowing dropping into the lower 70s, 60s, then 50, etc. At this time the patient's heart rate steadily started to drop as well despite efforts to improve patient's saturations.   0909 - Pt appears to have fixed and dilated pupils and no palpable pulse. MD aware and patient's wife informed by MD.    [phone] - Official cardiac time of death. Heart sounds and bilateral breath sounds absent. Verified by Mayford Knife, RN & Stan Head, RN. MD aware and family notified.    0930 - Wife, [name], called by this RN. Condolences given and all questions were answered.    98 - Family visited and the patient's wife has taken the patient's personal radio home and she confirms no other valuables have been left at bedside.

## 2021-01-15 NOTE — Progress Notes (Signed)
NAME:  [name], MRN:  [id number], DOB:  09/17/1950, LOS: 9 ADMISSION DATE:  12/10/2020 CONSULTATION DATE:  11/18/2020 REFERRING MD:  Lavenia Atlas CHIEF COMPLAINT:  SOB, hypercapnic/hypoxic respiratory failure   History of Present Illness:  70 year old man with PMHx significant for HTN, HLD, CAD (4-vessel), OSA (on CPAP), COPD (GOLD stage III, on 4LNC home O2) who presented to [facility] ED 7/25 via EMS for SOB.   At home, hypoxic on [facility] with sats 70%. Transitioned to NRB then CPAP. DuoNeb and Solumedrol administered in the field. Initially GCS 14, quickly declining to GCS 3 in ED. Patient subsequently intubated in ED for hypoxia and AMS. ABG showed pH 7.294/pCO2 64.6/pO2 372/Bicarb 31.6. Patient became progressively hypotensive post-intubation requiring Levophed initiation and CVL placement by ED.  PCCM consulted for admission.  Pertinent Medical History:  Four-vessel coronary artery disease, hypertension, hyperlipidemia, OSA on CPAP, Gold stage III COPD with resultant chronic respiratory failure on 4 L baseline supplemental oxygen.  Anxiety, depression, diabetes, gastritis.  GERD, hyperlipidemia, hypertension.  Significant Hospital Events: Including procedures, antibiotic start and stop dates in addition to other pertinent events   7/25 - BIB EMS for SOB, hypoxic/hypercapnic respiratory failure. Initially maintained on CPAP, became progressively altered (GCS 3) requiring intubation. PCCM consulted for admission.  Hypotensive following intubation, right IJ triple-lumen catheter placed by ER.  Started on norepinephrine.  IV fluids administered.  From pulmonary standpoint in addition to mechanical ventilation therapeutic interventions included: Scheduled bronchodilators, IV systemic steroids, and ceftriaxone with azithromycin.  Respiratory culture sent. 7/26: Heavily sedated, apparently was agitated overnight.  Still requiring norepinephrine infusion.,  Lactate actually trending up instead of  down.  Bleeding from right IJ triple-lumen catheter. Suture placed. 7/27 weaned OFF norepi  7/28 CXR worse. Got lasix abx changed to Ceftazidime as cultures back and growing Pseudomonas (note UC also + w/ pseudomonas: but only 4K) 7/29 echo ordered. EF 60-65%, no WMA, mild concentric hypertrophy, RV fxn nml.  7/30: Increasing diuretics.  Hemodynamically stable.  Removing Foley catheter after diuresis.  Still on high PEEP/FiO2.  Increasing frequency of bronchodilators. 7/31: Changed prednisone back to IV steroids.  Repeated sputum culture.  Spoke to wife, made aware of poor prognosis.  Palliative care planning on meeting later today.  Sodium climbing.  Free water replacement added.    Ceftazidine 7/28 >> Linezolid 8/2 >>  Interim History / Subjective:   Family meeting discussed to continue supportive care and possibly transition to comfort care this Saturday if no improvement.  Overnight, O2 Sats decreased to low 70s. Patient on 100% and 12 peep.no response to recruitment maneuver and well sedated. CXR shows no pneumothorax. bilateral lower to mid lung field airspace disease is worsening compared to prior. ABG shows Po2 45 which ensures good correlation with pulse ox. Lasix 80 mg IV given and vecuronium given. Wife called to discuss clinical worsening.  UOP -1,625 Fever 102.5, WBC 17.2 from 12.3 Glucose range 137-288: basal insulin increased yesterday  Objective:  Blood pressure 110/69, pulse (!) 125, temperature 99.4 F (37.4 C), temperature source Oral, resp. rate (!) 24, height '5\' 10"'$  (1.778 m), weight 93.2 kg, SpO2 (!) 79 %.    Vent Mode: PRVC FiO2 (%):  [70 %-100 %] 100 % Set Rate:  [20 bmp-24 bmp] 24 bmp Vt Set:  [510 mL] 510 mL PEEP:  [12 cmH20] 12 cmH20 Plateau Pressure:  [22 cmH20-29 cmH20] 28 cmH20   Intake/Output Summary (Last 24 hours) at 01-07-2021 0717 Last data filed at 01-07-2021 0700 Gross  per 24 hour  Intake 5904.54 ml  Output 1625 ml  Net 4279.54 ml    Filed  Weights   12/15/20 0100 12/16/20 0500 12-31-20 0400  Weight: 93.4 kg 92.7 kg 93.2 kg    Physical Examination:  General: critically ill appearing on mech vent HEENT: MM pink/moist; ETT in place Neuro:sedated  CV: s1s2, sinus tach, no m/r/g PULM:  dim rhonchi BS bilaterally; on mech vent PRVC 100% and 12  peep with sats 78% GI: soft, bsx4 active  Extremities: warm/dry, trace edema  Skin: no rashes or lesions   Labs: Na 145 from 147 Bun 82 from 85   Resolved Hospital Problem List   Lactic acidosis  Septic shock resolved  Assessment & Plan:  Acute-on-chronic hypoxemic/hypercapnic respiratory failure in the setting of acute exacerbation chronic obstructive pulmonary disease and pseudomonas/staph PNA  History of COPD, GOLD stage III on 4L [place] baseline P: -continue scheduled duonebs and IV steroids -continue 10 days ceftazidime and linezolid -continue current vent settings PRVC +12 peep and 100% fio2 -wean fio2 for sats > 88% -VAP prevention in place -trend fever/WBC -trend CXR   Hypernatremia--> improving P: -continue free water -trend BMP   AKI P: -avoid nephrotoxic agents/renal dose meds -will hold lasix today given continued rise in creatinine -stopped lovenox and started subcu heparin   History of HTN, four-vessel coronary artery disease, and hyperlipidemia.  Had trop I bump but EF good and no WM abnormality on echo P: -continue plavix and pravastatin -hold home anti-hypertensives   Type 2 diabetes mellitus P: -will continue steroids -SSI and CBG monitoring -continue lantus and TF coverage  Acute metabolic encephalopathy  History of anxiety and depression P: -PAD protocol in place -continue neurontin and mirtazapine; consider starting home buspar   GOC-  -8/3: Wife updated over phone by Dr. Silas Flood overnight and by me this morning. Patient clinically worsening. O2 sats remain in 70s. Wife wants to continue care to give antibiotics time to see if  there is any improvement.  -8/2: end stage COPD with severe bacterial bilateral pneumonia, family understands this may be terminal; patient does not want a trach, continue supportive care including vent with potential eventual transition to comfort if no changes over next couple days, palliative following  Best Practice (right click and "Reselect all SmartList Selections" daily)   Diet/type: tubefeeds DVT prophylaxis: LMWH stopped; sub heparin GI prophylaxis: PPI Lines: Central line Foley:  Yes, and it is still needed Code Status:  full code; partial code Last date of multidisciplinary goals of care discussion (discussion above on 8/3)   Critical Care Time: 35 minutes  JD Rexene [facility] 12/31/20, 7:48 AM  Please see [email or web address] for pager details.  From 7A-7P if no response, please call [phone]. After hours, please call ELink [phone].

## 2021-01-15 NOTE — Progress Notes (Signed)
   Palliative Medicine Inpatient Follow Up Note  Chart reviewed. It appears that Drayson acutely worsened last evening to the early morning hours. The intensivist team explained the poor outcomes to the patients family. Unfortunately, he passed away at 9:22AM. Per secure chat with nursing family is on the way.   PMT is available if additional support is needed.  No Charge ______________________________________________________________________________________ [place] Team Team Cell Phone: [phone] Please utilize secure chat with additional questions, if there is no response within 30 minutes please call the above phone number  Palliative Medicine Team providers are available by phone from 7am to 7pm daily and can be reached through the team cell phone.  Should this patient require assistance outside of these hours, please call the patient's attending physician.

## 2021-01-15 NOTE — Progress Notes (Signed)
Pt wife, [name], back to bedside from about 2300 to 0230 after receiving call from MD about change in condition.  Wife tearful and asking questions but seems very confused about why pts oxygen requirements are up and why "staff didn't trying pt on 80% before turning him all the way up." RT and this RN attempted to explain several times that pts O2 sats were in the low 70's initially and despite pt currently receiving 100% oxygen through the vent that his sats were still barely in the 80's, so 80% would not be enough oxygen to support the pt. We mentioned that Jaevian's lungs are very sick and things can change very quickly.  [name] also keeps mentioning why we didn't give antibiotics instead of turning his oxygen up. Explained to [facility] that antibiotics take time to work and that Ariez is already on multiple antibiotics but he is very sick.   I offered several time to have the ground team come back and speak to her but she declined each time. Emotional support given to [facility].  [name] left around 0230 to get some rest. She was aware of pts O2 sats ranging from 78-83%. I stated if things should change with [place] I would call her right aware.

## 2021-01-15 NOTE — Death Summary Note (Signed)
DEATH SUMMARY   Patient Details  Name: [name] MRN: [id number] DOB: 12-21-1950  Admission/Discharge Information   Admit Date:  12-18-20  Date of Death: Date of Death: 12-27-2020  Time of Death: Time of Death: 0922  Length of Stay: 2022/08/03  Referring Physician: Forrest Moron, MD   Reason(s) for Hospitalization  Pneumonia  Diagnoses   Acute on chronic hypercapneic and hypoxemic respiratory failure from ARDS secondary to bilateral pneumonia with pseudomonas and staph aureus Severe COPD on HOT Acute kidney injury from renal tubular necrosis Septic shock DM2 Hx anxiety and depression HLD   Brief Hospital Course (including significant findings, care, treatment, and services provided and events leading to death)  70 year old man with PMHx significant for HTN, HLD, CAD (4-vessel), OSA (on CPAP), COPD (on 4LNC home O2) who presented to [facility] ED 2022/12/19 via EMS for SOB.   At home, hypoxic on [facility] with sats 70%. Transitioned to NRB then CPAP. DuoNeb and Solumedrol administered in the field. Initially GCS 14, quickly declining to GCS 3 in ED. Patient subsequently intubated in ED for hypoxia and AMS. ABG showed pH 7.294/pCO2 64.6/pO2 372/Bicarb 31.6. Patient became progressively hypotensive post-intubation requiring Levophed initiation and CVL placement by ED.  Despite broad spectrum antibiotics, lung protective tidal volumes, patient's oxygen levels did not improve throughout his stay.  Patient made DNR after discussion with family regarding his wishes.  On December 28, 2022, oxygen requirements worsened and family elected to allow him to pass in peace.  Pertinent Labs and Studies  Significant Diagnostic Studies DG Chest Port 1 View  Result Date: 27-Dec-2020 CLINICAL DATA:  Pneumonia. EXAM: PORTABLE CHEST 1 VIEW COMPARISON:  12/16/2020. 12/13/2020. 12/09/2020. CT chest 08/09/2019. FINDINGS: Endotracheal tube, NG tube, right IJ line in stable position. Heart size stable. Progressive bilateral interstitial  infiltrates/edema. Progressive right upper lobe consolidation possibly related to pulmonary infiltrate and or loculated pleural effusion. Persistent bilateral pleural effusions. No pneumothorax. IMPRESSION: 1.  Lines and tubes in stable position. 2. Progressive bilateral interstitial infiltrates/edema. Progressive right lung consolidation possibly related to pulmonary infiltrate and or loculated pleural effusion. Persistent bilateral pleural effusions. Electronically Signed   By: Marcello Moores  Register   On: 2020/12/27 07:36   DG CHEST PORT 1 VIEW  Result Date: 12/16/2020 CLINICAL DATA:  Hypoxia, intubated EXAM: PORTABLE CHEST 1 VIEW COMPARISON:  12/15/2020 FINDINGS: 2 frontal views of the chest demonstrates stable position of the endotracheal tube, right internal jugular catheter, and enteric catheter. Cardiac silhouette is stable. Diffuse bilateral ground-glass airspace disease is again identified, with increasing consolidation in the right perihilar region since prior study. There are small bilateral pleural effusions. No evidence of pneumothorax. IMPRESSION: 1. Worsening bilateral airspace disease, with increased consolidation in the right perihilar region. 2. Stable small bilateral pleural effusions. 3. Stable support devices. Electronically Signed   By: Randa Ngo M.D.   On: 12/16/2020 21:36   DG Chest Port 1 View  Result Date: 12/15/2020 CLINICAL DATA:  Pneumonia EXAM: PORTABLE CHEST 1 VIEW COMPARISON:  12/14/2020 FINDINGS: Support devices are stable. Heart is normal size. Bilateral lower lobe airspace disease with layering effusions, similar to prior study. No acute bony abnormality. IMPRESSION: Continued layering effusions with bilateral lower lobe airspace disease. No significant change. Electronically Signed   By: Rolm Baptise M.D.   On: 12/15/2020 08:17   DG Chest Port 1 View  Result Date: 12/14/2020 CLINICAL DATA:  Pneumonia. EXAM: PORTABLE CHEST 1 VIEW COMPARISON:  Chest radiograph December 13, 2020. FINDINGS: ET tube mid trachea. Right IJ  central venous catheter tip projects over the superior vena cava. Enteric tube courses inferior to the diaphragm. Stable cardiac and mediastinal contours. Similar diffuse bilateral airspace opacities. Sparing of the right upper lobe. Left lung base excluded from view. Osseous structures unremarkable. IMPRESSION: Similar-appearing diffuse bilateral airspace opacities with sparing of the right upper lobe, potentially secondary to edema or multifocal infection. Support apparatus as above. Electronically Signed   By: Lovey Newcomer M.D.   On: 12/14/2020 12:16   DG CHEST PORT 1 VIEW  Result Date: 12/13/2020 CLINICAL DATA:  ETT placement EXAM: PORTABLE CHEST 1 VIEW COMPARISON:  December 12, 2020 FINDINGS: The ETT is in good position. The right central line terminates in the SVC. The NG tube terminates below today's film. The cardiomediastinal silhouette is stable. No pneumothorax. Diffuse interstitial opacities are seen on the left. More focal opacities are seen in the bases, possibly with small associated effusions. There is increased lucency in the right upper lobe suggesting emphysematous change. IMPRESSION: 1. Increased interstitial markings in the lungs with relative sparing of the right upper lobe may represent asymmetric edema versus an atypical infection. 2. Mild bibasilar opacities, probably with small associated effusions. 3. Support apparatus as above. Electronically Signed   By: Dorise Bullion III M.D   On: 12/13/2020 12:05   DG Chest Port 1 View  Result Date: 12/12/2020 CLINICAL DATA:  Shortness of breath EXAM: PORTABLE CHEST 1 VIEW COMPARISON:  X-ray dated November 11, 2020 FINDINGS: Stable position of right IJ line, ET tube and enteric tube. Cardiac and mediastinal contours are unchanged. Unchanged basilar predominant reticular opacities. Probable small bilateral pleural effusions. No evidence of pneumothorax. IMPRESSION: Stable support devices with no new  parenchymal opacity. Electronically Signed   By: Yetta Glassman MD   On: 12/12/2020 12:59   DG CHEST PORT 1 VIEW  Result Date: 12/11/2020 CLINICAL DATA:  Hypercapnic respiratory failure EXAM: PORTABLE CHEST 1 VIEW COMPARISON:  December 03, 2020 FINDINGS: The cardiomediastinal silhouette is unchanged and enlarged in contour.RIGHT IJ CVC tip terminates in the superior cavoatrial junction. ETT tip terminates 2.6 cm above the carina. Enteric tube side port project over the stomach. Likely small bilateral pleural effusions. No pneumothorax. Persistent bilateral lower lung predominant heterogeneous opacities superimposed on a background of reticular prominence, similar in comparison to prior. Visualized abdomen is unremarkable. Cardiac stent. Vascular calcifications. IMPRESSION: 1.  Support apparatus as described above. 2. Similar appearance of bibasilar predominant heterogeneous and reticular opacities. Electronically Signed   By: Valentino Saxon MD   On: 12/11/2020 13:16   DG CHEST PORT 1 VIEW  Result Date: 12/11/2020 CLINICAL DATA:  Wheezing. EXAM: PORTABLE CHEST 1 VIEW COMPARISON:  December 10, 2020 FINDINGS: There is stable endotracheal tube, nasogastric tube and right internal jugular venous catheter positioning. Moderate to marked severity infiltrates are seen within the bilateral lower lobes, increased in severity when compared to the prior study. There is no evidence of a pleural effusion or pneumothorax. The heart size and mediastinal contours are within normal limits. A coronary artery stent is seen. The visualized skeletal structures are unremarkable. IMPRESSION: Moderate to marked severity bilateral lower lobe infiltrates, increased in severity when compared to the prior study. Electronically Signed   By: Virgina Norfolk M.D.   On: 12/11/2020 02:54   DG Chest Port 1 View  Result Date: 12/10/2020 CLINICAL DATA:  Respiratory failure. EXAM: PORTABLE CHEST 1 VIEW COMPARISON:  12/09/2020. FINDINGS:  Endotracheal tube, NG tube, right IJ line stable position. Heart size stable. Coronary stents noted.  Left lung base infiltrate consistent with pneumonia again noted. Small left pleural effusion. No pneumothorax. IMPRESSION: 1.  Lines and tubes in stable position. 2. Left base infiltrate consistent with pneumonia again noted. Small left pleural effusion. Electronically Signed   By: Marcello Moores  Register   On: 12/10/2020 07:57   DG CHEST PORT 1 VIEW  Result Date: 12/09/2020 CLINICAL DATA:  Adjustment of ET and OG placement EXAM: PORTABLE ABDOMEN - 1 VIEW; PORTABLE CHEST - 1 VIEW COMPARISON:  Radiograph 11/14/2020 FINDINGS: *Endotracheal tube appears appropriate position in mid trachea terminating at the level of the aortic arch. *Right IJ approach central venous catheter tip terminates in the mid SVC. *A transesophageal tube tip appears to been slightly retracted remaining position towards the gastric antrum with side port distal to the GE junction. *Additional external support devices and telemetry leads overlie the chest. Increasing interstitial and patchy opacities are present in the mid to lower lungs. Some more bandlike opacity in the left lung base with partial obscuration of left hemidiaphragm could reflect some layering pleural effusion. No discernible right pleural effusion is seen. No pneumothorax. Stable cardiomediastinal contours with a calcified aorta. No acute or worrisome abnormality of the chest wall. Degenerative changes are present in the imaged spine and shoulders. IMPRESSION: 1. Endotracheal tube terminates in the mid trachea. 2. Transesophageal tube terminates at the gastric antrum. 3. Right IJ central venous catheter terminates at the mid SVC. 4. Some increasing streaky and patchy basilar opacities likely reflecting a degree of atelectasis though some developing edema and layering left effusion may be present. 5.  Aortic Atherosclerosis (ICD10-I70.0). Electronically Signed   By: Lovena Le M.D.    On: 12/09/2020 03:43   DG Chest Portable 1 View  Result Date: 12/01/2020 CLINICAL DATA:  Status post line placement. EXAM: PORTABLE CHEST 1 VIEW COMPARISON:  December 08, 2020 (9:33 p.m.) FINDINGS: There is stable endotracheal tube and nasogastric tube positioning. A right internal jugular venous catheter is seen with its distal tip noted within the midportion of the superior vena cava. This is approximately 5.5 cm proximal to the junction of the superior vena cava and right atrium. Mild atelectasis and/or infiltrate is seen within the left lung base. There is no evidence of a pleural effusion or pneumothorax. The heart size and mediastinal contours are within normal limits. A coronary artery stent is in place. The visualized skeletal structures are unremarkable. IMPRESSION: Interval right internal jugular venous catheter placement and positioning, as described above. Electronically Signed   By: Virgina Norfolk M.D.   On: 12/07/2020 23:58   DG Chest Portable 1 View  Result Date: 11/18/2020 CLINICAL DATA:  Intubation, OG placement, shortness of breath EXAM: PORTABLE CHEST - 1 VIEW; PORTABLE ABDOMEN - 1 VIEW COMPARISON:  CT 08/09/2019, chest radiographs 11/07/2019, abdominal radiograph 11/10/2018 FINDINGS: Patient is rotated with a significant left anterior obliquity. Endotracheal tube tip terminates approximately 1.3 cm from the carina. Recommend retraction 2-3 cm to position in the mid trachea. Transesophageal tube tip terminates well beyond the GE junction, crossing midline and terminating in the right upper quadrant, likely towards the duodenal bulb. Hyperinflation and coarsened interstitial changes similar to priors. Asymmetric elevation the right hemidiaphragm is similar to prior. No new consolidative process no pneumothorax. No layering pleural fluid is seen. Stable cardiomediastinal contours accounting for rotation with a tortuous, calcified aorta. No acute or worrisome osseous or soft tissue  abnormality of the chest wall. No high-grade obstructive bowel gas pattern is seen in the abdomen. No suspicious abdominal  calcifications. Degenerative changes in the shoulders, spine, and pelvis. No acute osseous abnormality or suspicious osseous lesion. No other suspicious soft tissue abnormality. IMPRESSION: Endotracheal tube tip terminates 1.3 cm from the carina. Recommend retraction 2-3 cm to position in the mid trachea. Transesophageal tube tip positioned well beyond the GE junction, crossing midline terminating in the region of the right upper quadrant/duodenal bulb. Fall nonspecific elevation of the right hemidiaphragm. Background of hyperinflation and coarsened emphysematous changes. No high-grade bowel obstruction with a nonspecific bowel gas pattern. Electronically Signed   By: Lovena Le M.D.   On: 12/07/2020 22:08   DG Abd Portable 1V  Result Date: 12/09/2020 CLINICAL DATA:  Adjustment of ET and OG placement EXAM: PORTABLE ABDOMEN - 1 VIEW; PORTABLE CHEST - 1 VIEW COMPARISON:  Radiograph 12/12/2020 FINDINGS: *Endotracheal tube appears appropriate position in mid trachea terminating at the level of the aortic arch. *Right IJ approach central venous catheter tip terminates in the mid SVC. *A transesophageal tube tip appears to been slightly retracted remaining position towards the gastric antrum with side port distal to the GE junction. *Additional external support devices and telemetry leads overlie the chest. Increasing interstitial and patchy opacities are present in the mid to lower lungs. Some more bandlike opacity in the left lung base with partial obscuration of left hemidiaphragm could reflect some layering pleural effusion. No discernible right pleural effusion is seen. No pneumothorax. Stable cardiomediastinal contours with a calcified aorta. No acute or worrisome abnormality of the chest wall. Degenerative changes are present in the imaged spine and shoulders. IMPRESSION: 1. Endotracheal  tube terminates in the mid trachea. 2. Transesophageal tube terminates at the gastric antrum. 3. Right IJ central venous catheter terminates at the mid SVC. 4. Some increasing streaky and patchy basilar opacities likely reflecting a degree of atelectasis though some developing edema and layering left effusion may be present. 5.  Aortic Atherosclerosis (ICD10-I70.0). Electronically Signed   By: Lovena Le M.D.   On: 12/09/2020 03:43   DG Abd Portable 1 View  Result Date: 12/11/2020 CLINICAL DATA:  Intubation, OG placement, shortness of breath EXAM: PORTABLE CHEST - 1 VIEW; PORTABLE ABDOMEN - 1 VIEW COMPARISON:  CT 08/09/2019, chest radiographs 11/07/2019, abdominal radiograph 11/10/2018 FINDINGS: Patient is rotated with a significant left anterior obliquity. Endotracheal tube tip terminates approximately 1.3 cm from the carina. Recommend retraction 2-3 cm to position in the mid trachea. Transesophageal tube tip terminates well beyond the GE junction, crossing midline and terminating in the right upper quadrant, likely towards the duodenal bulb. Hyperinflation and coarsened interstitial changes similar to priors. Asymmetric elevation the right hemidiaphragm is similar to prior. No new consolidative process no pneumothorax. No layering pleural fluid is seen. Stable cardiomediastinal contours accounting for rotation with a tortuous, calcified aorta. No acute or worrisome osseous or soft tissue abnormality of the chest wall. No high-grade obstructive bowel gas pattern is seen in the abdomen. No suspicious abdominal calcifications. Degenerative changes in the shoulders, spine, and pelvis. No acute osseous abnormality or suspicious osseous lesion. No other suspicious soft tissue abnormality. IMPRESSION: Endotracheal tube tip terminates 1.3 cm from the carina. Recommend retraction 2-3 cm to position in the mid trachea. Transesophageal tube tip positioned well beyond the GE junction, crossing midline terminating in the  region of the right upper quadrant/duodenal bulb. Fall nonspecific elevation of the right hemidiaphragm. Background of hyperinflation and coarsened emphysematous changes. No high-grade bowel obstruction with a nonspecific bowel gas pattern. Electronically Signed   By: Elwin Sleight.D.  On: 11/17/2020 22:08   ECHOCARDIOGRAM COMPLETE  Result Date: 12/12/2020    ECHOCARDIOGRAM REPORT   Patient Name:   [name] Date of Exam: 12/12/2020 Medical Rec #:  [id number]        Height:       70.0 in Accession #:    [id number]       Weight:       209.4 lb Date of Birth:  [birth year]        BSA:          2.129 m Patient Age:    70 years         BP:           104/79 mmHg Patient Gender: M                HR:           106 bpm. Exam Location:  Inpatient Procedure: 2D Echo Indications:    acute diastolic chf  History:        Patient has prior history of Echocardiogram examinations, most                 recent 12/09/2008. CHF, CAD, COPD; Risk Factors:Sleep Apnea,                 Dyslipidemia, Hypertension and Diabetes.  Sonographer:    Johny Chess Referring Phys: [place] Comments: Echo performed with patient supine and on artificial respirator, no parasternal window and no apical window. IMPRESSIONS  1. Left ventricular ejection fraction, by estimation, is 60 to 65%. The left ventricle has normal function. The left ventricle has no regional wall motion abnormalities. There is mild concentric left ventricular hypertrophy. Left ventricular diastolic parameters are consistent with Grade I diastolic dysfunction (impaired relaxation).  2. Right ventricular systolic function is normal. The right ventricular size is normal. There is severely elevated pulmonary artery systolic pressure.  3. The mitral valve is normal in structure. No evidence of mitral valve regurgitation. No evidence of mitral stenosis.  4. The aortic valve is tricuspid. Aortic valve regurgitation is not visualized. No aortic  stenosis is present.  5. The inferior vena cava is normal in size with greater than 50% respiratory variability, suggesting right atrial pressure of 3 mmHg. FINDINGS  Left Ventricle: Left ventricular ejection fraction, by estimation, is 60 to 65%. The left ventricle has normal function. The left ventricle has no regional wall motion abnormalities. The left ventricular internal cavity size was normal in size. There is  mild concentric left ventricular hypertrophy. Left ventricular diastolic parameters are consistent with Grade I diastolic dysfunction (impaired relaxation). Normal left ventricular filling pressure. Right Ventricle: The right ventricular size is normal. No increase in right ventricular wall thickness. Right ventricular systolic function is normal. There is severely elevated pulmonary artery systolic pressure. The tricuspid regurgitant velocity is 4.15 m/s, and with an assumed right atrial pressure of 3 mmHg, the estimated right ventricular systolic pressure is [id number]. Left Atrium: Left atrial size was normal in size. Right Atrium: Right atrial size was normal in size. Pericardium: There is no evidence of pericardial effusion. Mitral Valve: The mitral valve is normal in structure. No evidence of mitral valve regurgitation. No evidence of mitral valve stenosis. Tricuspid Valve: The tricuspid valve is normal in structure. Tricuspid valve regurgitation is mild . No evidence of tricuspid stenosis. Aortic Valve: The aortic valve is tricuspid. Aortic valve regurgitation is not visualized. No aortic stenosis is present. Pulmonic Valve:  The pulmonic valve was normal in structure. Pulmonic valve regurgitation is not visualized. No evidence of pulmonic stenosis. Aorta: The aortic root is normal in size and structure. Venous: The inferior vena cava is normal in size with greater than 50% respiratory variability, suggesting right atrial pressure of 3 mmHg. IAS/Shunts: No atrial level shunt detected by color flow  Doppler.  LEFT VENTRICLE PLAX 2D LVIDd:         4.10 cm  Diastology LVIDs:         3.00 cm  LV e' medial:    5.77 cm/s LV PW:         1.10 cm  LV E/e' medial:  9.6 LV IVS:        1.10 cm  LV e' lateral:   7.72 cm/s LVOT diam:     1.80 cm  LV E/e' lateral: 7.2 LV SV:         29 LV SV Index:   14 LVOT Area:     2.54 cm  RIGHT VENTRICLE         IVC TAPSE (M-mode): 2.1 cm  IVC diam: 1.80 cm LEFT ATRIUM           Index       RIGHT ATRIUM           Index LA diam:      2.70 cm 1.27 cm/m  RA Area:     17.60 cm LA Vol (A4C): 25.4 ml 11.93 ml/m RA Volume:   52.40 ml  24.62 ml/m  AORTIC VALVE LVOT Vmax:   84.80 cm/s LVOT Vmean:  54.800 cm/s LVOT VTI:    0.114 m  AORTA Ao Asc diam: 3.30 cm MITRAL VALVE               TRICUSPID VALVE MV Area (PHT): 5.31 cm    TR Peak grad:   68.9 mmHg MV Decel Time: 143 msec    TR Vmax:        415.00 cm/s MV E velocity: 55.30 cm/s MV A velocity: 84.80 cm/s  SHUNTS MV E/A ratio:  0.65        Systemic VTI:  0.11 m                            Systemic Diam: 1.80 cm Skeet Latch MD Electronically signed by Skeet Latch MD Signature Date/Time: 12/12/2020/6:50:38 PM    Final     Microbiology Recent Results (from the past 240 hour(s))  Resp Panel by RT-PCR (Flu A&B, Covid) Nasopharyngeal Swab     Status: None   Collection Time: 12/12/2020  9:01 PM   Specimen: Nasopharyngeal Swab; Nasopharyngeal(NP) swabs in vial transport medium  Result Value Ref Range Status   SARS Coronavirus 2 by RT PCR NEGATIVE NEGATIVE Final    Comment: (NOTE) SARS-CoV-2 target nucleic acids are NOT DETECTED.  The SARS-CoV-2 RNA is generally detectable in upper respiratory specimens during the acute phase of infection. The lowest concentration of SARS-CoV-2 viral copies this assay can detect is 138 copies/mL. A negative result does not preclude SARS-Cov-2 infection and should not be used as the sole basis for treatment or other patient management decisions. A negative result may occur with  improper  specimen collection/handling, submission of specimen other than nasopharyngeal swab, presence of viral mutation(s) within the areas targeted by this assay, and inadequate number of viral copies(<138 copies/mL). A negative result must be combined with clinical observations, patient history, and epidemiological  information. The expected result is Negative.  Fact Sheet for Patients:  EntrepreneurPulse.com.au  Fact Sheet for Healthcare Providers:  IncredibleEmployment.be  This test is no t yet approved or cleared by the [place] FDA and  has been authorized for detection and/or diagnosis of SARS-CoV-2 by FDA under an Emergency Use Authorization (EUA). This EUA will remain  in effect (meaning this test can be used) for the duration of the COVID-19 declaration under Section 564(b)(1) of the Act, 21 U.S.C.section 360bbb-3(b)(1), unless the authorization is terminated  or revoked sooner.       Influenza A by PCR NEGATIVE NEGATIVE Final   Influenza B by PCR NEGATIVE NEGATIVE Final    Comment: (NOTE) The Xpert Xpress SARS-CoV-2/FLU/RSV plus assay is intended as an aid in the diagnosis of influenza from Nasopharyngeal swab specimens and should not be used as a sole basis for treatment. Nasal washings and aspirates are unacceptable for Xpert Xpress SARS-CoV-2/FLU/RSV testing.  Fact Sheet for Patients: EntrepreneurPulse.com.au  Fact Sheet for Healthcare Providers: IncredibleEmployment.be  This test is not yet approved or cleared by the [place] FDA and has been authorized for detection and/or diagnosis of SARS-CoV-2 by FDA under an Emergency Use Authorization (EUA). This EUA will remain in effect (meaning this test can be used) for the duration of the COVID-19 declaration under Section 564(b)(1) of the Act, 21 U.S.C. section 360bbb-3(b)(1), unless the authorization is terminated or revoked.  Performed at  [facility], [place] [address] [place], [place] [address]   Blood Culture (routine x 2)     Status: None   Collection Time: 12/06/2020  9:19 PM   Specimen: BLOOD  Result Value Ref Range Status   Specimen Description BLOOD LEFT ANTECUBITAL  Final   Special Requests   Final    BOTTLES DRAWN AEROBIC AND ANAEROBIC Blood Culture results may not be optimal due to an inadequate volume of blood received in culture bottles   Culture   Final    NO GROWTH 5 DAYS Performed at [facility], [place] [address], [place], [place] [address]    Report Status 12/13/2020 FINAL  Final  Urine Culture     Status: Abnormal   Collection Time: 12/03/2020  9:42 PM   Specimen: In/Out Cath Urine  Result Value Ref Range Status   Specimen Description IN/OUT CATH URINE  Final   Special Requests   Final    NONE Performed at [facility], [place] [address] [place], [place] [address]    Culture 4,000 COLONIES/mL PSEUDOMONAS AERUGINOSA (A)  Final   Report Status 12/13/2020 FINAL  Final   Organism ID, Bacteria PSEUDOMONAS AERUGINOSA (A)  Final      Susceptibility   Pseudomonas aeruginosa - MIC*    CEFTAZIDIME <=1 SENSITIVE Sensitive     CIPROFLOXACIN <=0.25 SENSITIVE Sensitive     GENTAMICIN 4 SENSITIVE Sensitive     IMIPENEM 2 SENSITIVE Sensitive     PIP/TAZO <=4 SENSITIVE Sensitive     * 4,000 COLONIES/mL PSEUDOMONAS AERUGINOSA  MRSA Next Gen by PCR, Nasal     Status: Abnormal   Collection Time: 12/09/20  1:15 AM  Result Value Ref Range Status   MRSA by PCR Next Gen DETECTED (A) NOT DETECTED Final    Comment: RESULT CALLED TO, READ BACK BY AND VERIFIED WITH: PUCKETT,R RN 12/09/2020 AT [id number] SKEEN,P (NOTE) The GeneXpert MRSA Assay (FDA approved for NASAL specimens only), is one component of a comprehensive MRSA colonization surveillance program. It is not intended  to diagnose MRSA infection nor to guide or monitor treatment for MRSA infections. Test performance is not FDA approved in patients less  than 43 years old. Performed at [facility], [place] [address] [place], [place] [address]   Blood Culture (routine x 2)     Status: Abnormal   Collection Time: 12/09/20  3:25 AM   Specimen: BLOOD LEFT HAND  Result Value Ref Range Status   Specimen Description BLOOD LEFT HAND  Final   Special Requests   Final    BOTTLES DRAWN AEROBIC AND ANAEROBIC Blood Culture adequate volume   Culture  Setup Time   Final    GRAM POSITIVE COCCI IN CLUSTERS ANAEROBIC BOTTLE ONLY Organism ID to follow CRITICAL RESULT CALLED TO, READ BACK BY AND VERIFIED WITH: PARMD MIRANDA BRYK BY MESSAN H. AT [id number] 02 7 27 2022    Culture (A)  Final    STAPHYLOCOCCUS CAPITIS THE SIGNIFICANCE OF ISOLATING THIS ORGANISM FROM A SINGLE SET OF BLOOD CULTURES WHEN MULTIPLE SETS ARE DRAWN IS UNCERTAIN. PLEASE NOTIFY THE MICROBIOLOGY DEPARTMENT WITHIN ONE WEEK IF SPECIATION AND SENSITIVITIES ARE REQUIRED. Performed at [facility], [place] [address], [place], [place] [address]    Report Status 12/12/2020 FINAL  Final  Blood Culture ID Panel (Reflexed)     Status: Abnormal   Collection Time: 12/09/20  3:25 AM  Result Value Ref Range Status   Enterococcus faecalis NOT DETECTED NOT DETECTED Final   Enterococcus Faecium NOT DETECTED NOT DETECTED Final   Listeria monocytogenes NOT DETECTED NOT DETECTED Final   Staphylococcus species DETECTED (A) NOT DETECTED Final    Comment: CRITICAL RESULT CALLED TO, READ BACK BY AND VERIFIED WITH: PARMD MIRANDA BRYK BY MESSAN H. AT [id number] 02 7 27 2022    Staphylococcus aureus (BCID) NOT DETECTED NOT DETECTED Final   Staphylococcus epidermidis NOT DETECTED NOT DETECTED Final   Staphylococcus lugdunensis NOT DETECTED NOT DETECTED Final   Streptococcus species NOT DETECTED NOT DETECTED Final   Streptococcus agalactiae NOT DETECTED NOT DETECTED Final   Streptococcus pneumoniae NOT DETECTED NOT DETECTED Final   Streptococcus pyogenes NOT DETECTED NOT DETECTED Final    A.calcoaceticus-baumannii NOT DETECTED NOT DETECTED Final   Bacteroides fragilis NOT DETECTED NOT DETECTED Final   Enterobacterales NOT DETECTED NOT DETECTED Final   Enterobacter cloacae complex NOT DETECTED NOT DETECTED Final   Escherichia coli NOT DETECTED NOT DETECTED Final   Klebsiella aerogenes NOT DETECTED NOT DETECTED Final   Klebsiella oxytoca NOT DETECTED NOT DETECTED Final   Klebsiella pneumoniae NOT DETECTED NOT DETECTED Final   Proteus species NOT DETECTED NOT DETECTED Final   Salmonella species NOT DETECTED NOT DETECTED Final   Serratia marcescens NOT DETECTED NOT DETECTED Final   Haemophilus influenzae NOT DETECTED NOT DETECTED Final   Neisseria meningitidis NOT DETECTED NOT DETECTED Final   Pseudomonas aeruginosa NOT DETECTED NOT DETECTED Final   Stenotrophomonas maltophilia NOT DETECTED NOT DETECTED Final   Candida albicans NOT DETECTED NOT DETECTED Final   Candida auris NOT DETECTED NOT DETECTED Final   Candida glabrata NOT DETECTED NOT DETECTED Final   Candida krusei NOT DETECTED NOT DETECTED Final   Candida parapsilosis NOT DETECTED NOT DETECTED Final   Candida tropicalis NOT DETECTED NOT DETECTED Final   Cryptococcus neoformans/gattii NOT DETECTED NOT DETECTED Final    Comment: Performed at [facility], [place] [address], [place], [place] [address]  Culture, Respiratory w Gram Stain     Status: None   Collection Time: 12/09/20  8:49 AM   Specimen: Tracheal Aspirate; Respiratory  Result Value Ref Range Status   Specimen Description TRACHEAL ASPIRATE  Final   Special Requests NONE  Final   Gram Stain   Final    ABUNDANT WBC PRESENT,BOTH PMN AND MONONUCLEAR MODERATE GRAM NEGATIVE RODS RARE GRAM POSITIVE COCCI Performed at [facility], [place] [address] [place], [place] [address]    Culture ABUNDANT PSEUDOMONAS AERUGINOSA  Final   Report Status 12/12/2020 FINAL  Final   Organism ID, Bacteria PSEUDOMONAS AERUGINOSA  Final      Susceptibility    Pseudomonas aeruginosa - MIC*    CEFTAZIDIME 4 SENSITIVE Sensitive     CIPROFLOXACIN <=0.25 SENSITIVE Sensitive     GENTAMICIN 4 SENSITIVE Sensitive     IMIPENEM 2 SENSITIVE Sensitive     PIP/TAZO <=4 SENSITIVE Sensitive     * ABUNDANT PSEUDOMONAS AERUGINOSA  Culture, Respiratory w Gram Stain     Status: None   Collection Time: 12/14/20 11:45 AM   Specimen: Tracheal Aspirate; Respiratory  Result Value Ref Range Status   Specimen Description TRACHEAL ASPIRATE  Final   Special Requests NONE  Final   Gram Stain   Final    MODERATE WBC PRESENT, PREDOMINANTLY PMN FEW GRAM POSITIVE COCCI IN CLUSTERS RARE GRAM NEGATIVE COCCOBACILLI Performed at [facility], [address], [place], [place] [address]    Culture FEW METHICILLIN RESISTANT STAPHYLOCOCCUS AUREUS  Final   Report Status 12/16/2020 FINAL  Final   Organism ID, Bacteria METHICILLIN RESISTANT STAPHYLOCOCCUS AUREUS  Final      Susceptibility   Methicillin resistant staphylococcus aureus - MIC*    CIPROFLOXACIN >=8 RESISTANT Resistant     ERYTHROMYCIN >=8 RESISTANT Resistant     GENTAMICIN <=0.5 SENSITIVE Sensitive     OXACILLIN >=4 RESISTANT Resistant     TETRACYCLINE <=1 SENSITIVE Sensitive     VANCOMYCIN 1 SENSITIVE Sensitive     TRIMETH/SULFA >=320 RESISTANT Resistant     CLINDAMYCIN <=0.25 SENSITIVE Sensitive     RIFAMPIN <=0.5 SENSITIVE Sensitive     Inducible Clindamycin NEGATIVE Sensitive     * FEW METHICILLIN RESISTANT STAPHYLOCOCCUS AUREUS    Lab Basic Metabolic Panel: Recent Labs  Lab 12/12/20 0306 12/12/20 0418 12/13/20 0344 12/13/20 1600 12/14/20 0209 12/14/20 1145 12/14/20 1700 12/15/20 0455 12/15/20 1730 12/16/20 0249 12/16/20 2122 Dec 18, 2020 0310  NA 146*   < > 148*   < > 151*   < > 149* 149* 149* 147* 145 145  K 4.0   < > 3.9   < > 3.6   < > 4.2 4.0 3.5 4.1 3.5 4.5  CL 105  --  105   < > 104   < > 101 98 96* 95*  --  92*  CO2 37*  --  37*   < > 40*   < > 39* 41* 45* 43*  --  42*  GLUCOSE  153*  --  163*   < > 182*   < > 291* 250* 207* 256*  --  205*  BUN 25*  --  30*   < > 35*   < > 37* 45* 55* 67*  --  82*  CREATININE 0.87  --  0.95   < > 0.89   < > 1.02 1.03 1.14 1.43*  --  1.81*  CALCIUM 8.7*  --  9.1   < > 9.3   < > 9.2 9.0 9.1 9.1  --  9.2  MG 2.3  --  2.4  --  2.6*  --   --  2.8*  --  2.9*  --   --    < > = values in this interval not displayed.   Liver Function Tests: Recent Labs  Lab 12/13/20 1600 12/14/20 1145 12/14/20 1700 12/15/20 0455 12/15/20 1730  AST '17 19 20 22 '$ 35  ALT '20 20 21 22 31  '$ ALKPHOS 40 46 45 45 46  BILITOT 0.7 0.6 1.0 0.7 0.8  PROT 5.9* 5.8* 5.9* 5.9* 6.0*  ALBUMIN 2.3* 2.2* 2.3* 2.3* 2.3*   No results for input(s): LIPASE, AMYLASE in the last 168 hours. No results for input(s): AMMONIA in the last 168 hours. CBC: Recent Labs  Lab 12/12/20 0306 12/12/20 0418 12/13/20 0344 12/14/20 0209 12/15/20 0455 12/16/20 0249 12/16/20 2122 16-Jan-2021 0310  WBC 11.2*  --  11.8* 10.9* 12.4* 12.3*  --  17.2*  NEUTROABS 8.8*  --  9.1*  --   --   --   --   --   HGB 13.6   < > 13.2 12.8* 12.4* 11.2* 12.9* 11.1*  HCT 42.9   < > 42.0 39.9 39.7 36.6* 38.0* 37.7*  MCV 86.7  --  86.4 85.8 86.5 87.8  --  90.8  PLT 241  --  245 254 275 259  --  296   < > = values in this interval not displayed.   Cardiac Enzymes: No results for input(s): CKTOTAL, CKMB, CKMBINDEX, TROPONINI in the last 168 hours. Sepsis Labs: Recent Labs  Lab 12/14/20 0209 12/15/20 0455 12/16/20 0249 01-16-21 0310  WBC 10.9* 12.4* 12.3* 17.2*     Candee Furbish 12/18/2020, 4:54 PM

## 2021-01-15 NOTE — Progress Notes (Signed)
PHARMACY NOTE:  ANTIMICROBIAL RENAL DOSAGE ADJUSTMENT  Current antimicrobial regimen includes a mismatch between antimicrobial dosage and estimated renal function.  As per policy approved by the Pharmacy & Therapeutics and Medical Executive Committees, the antimicrobial dosage will be adjusted accordingly.  Current antimicrobial dosage:  Ceftazidime 2g every 8 hours  Indication: pneumonia  Renal Function:  Estimated Creatinine Clearance: 42.9 mL/min (A) (by C-G formula based on SCr of 1.81 mg/dL (H)). '[]'$      On intermittent HD, scheduled: '[]'$      On CRRT    Antimicrobial dosage has been changed to:  Ceftazidime 2g every 12 hours  Additional comments: Morning dose of Ceftazidime was clamped during administration. Since drug was not received pharmacy will adjust future administration schedule accordingly.   Thank you for allowing pharmacy to participate in this patient's care.  Reatha Harps, PharmD PGY1 Pharmacy Resident 01-11-2021 8:46 AM Check [email or web address] for unit specific pharmacy number

## 2021-01-15 DEATH — deceased
